# Patient Record
Sex: Male | Born: 1950 | Race: White | Hispanic: No | Marital: Married | Smoking: Former smoker
Health system: Southern US, Community
[De-identification: ages and names within clinical notes are randomized; demographics above are authoritative.]

## PROBLEM LIST (undated history)

## (undated) DIAGNOSIS — I701 Atherosclerosis of renal artery: Secondary | ICD-10-CM

## (undated) DIAGNOSIS — R9430 Abnormal result of cardiovascular function study, unspecified: Secondary | ICD-10-CM

## (undated) DIAGNOSIS — J449 Chronic obstructive pulmonary disease, unspecified: Secondary | ICD-10-CM

## (undated) DIAGNOSIS — K219 Gastro-esophageal reflux disease without esophagitis: Secondary | ICD-10-CM

## (undated) DIAGNOSIS — Z720 Tobacco use: Secondary | ICD-10-CM

## (undated) DIAGNOSIS — E785 Hyperlipidemia, unspecified: Secondary | ICD-10-CM

## (undated) DIAGNOSIS — M1990 Unspecified osteoarthritis, unspecified site: Secondary | ICD-10-CM

## (undated) DIAGNOSIS — C801 Malignant (primary) neoplasm, unspecified: Secondary | ICD-10-CM

## (undated) DIAGNOSIS — I739 Peripheral vascular disease, unspecified: Secondary | ICD-10-CM

## (undated) DIAGNOSIS — M546 Pain in thoracic spine: Secondary | ICD-10-CM

## (undated) DIAGNOSIS — I4891 Unspecified atrial fibrillation: Secondary | ICD-10-CM

## (undated) DIAGNOSIS — I2510 Atherosclerotic heart disease of native coronary artery without angina pectoris: Secondary | ICD-10-CM

## (undated) DIAGNOSIS — Z9981 Dependence on supplemental oxygen: Secondary | ICD-10-CM

## (undated) DIAGNOSIS — IMO0002 Reserved for concepts with insufficient information to code with codable children: Secondary | ICD-10-CM

## (undated) DIAGNOSIS — I219 Acute myocardial infarction, unspecified: Secondary | ICD-10-CM

## (undated) DIAGNOSIS — Z95828 Presence of other vascular implants and grafts: Secondary | ICD-10-CM

## (undated) DIAGNOSIS — G8929 Other chronic pain: Secondary | ICD-10-CM

## (undated) DIAGNOSIS — G894 Chronic pain syndrome: Secondary | ICD-10-CM

## (undated) HISTORY — PX: PR VEIN BYPASS GRAFT,AORTO-FEM-POP: 35551

## (undated) HISTORY — DX: Atherosclerosis of renal artery: I70.1

## (undated) HISTORY — PX: INGUINAL HERNIA REPAIR: SUR1180

## (undated) HISTORY — DX: Presence of other vascular implants and grafts: Z95.828

## (undated) HISTORY — DX: Gastro-esophageal reflux disease without esophagitis: K21.9

## (undated) HISTORY — DX: Unspecified osteoarthritis, unspecified site: M19.90

## (undated) HISTORY — DX: Reserved for concepts with insufficient information to code with codable children: IMO0002

## (undated) HISTORY — DX: Acute myocardial infarction, unspecified: I21.9

## (undated) HISTORY — DX: Tobacco use: Z72.0

## (undated) HISTORY — DX: Atherosclerotic heart disease of native coronary artery without angina pectoris: I25.10

## (undated) HISTORY — DX: Hyperlipidemia, unspecified: E78.5

## (undated) HISTORY — DX: Peripheral vascular disease, unspecified: I73.9

## (undated) HISTORY — DX: Unspecified atrial fibrillation: I48.91

## (undated) HISTORY — DX: Abnormal result of cardiovascular function study, unspecified: R94.30

## (undated) HISTORY — DX: Malignant (primary) neoplasm, unspecified: C80.1

## (undated) HISTORY — DX: Chronic obstructive pulmonary disease, unspecified: J44.9

---

## 2004-06-25 ENCOUNTER — Emergency Department (HOSPITAL_COMMUNITY): Admission: EM | Admit: 2004-06-25 | Discharge: 2004-06-26 | Payer: Self-pay | Admitting: Emergency Medicine

## 2005-09-29 ENCOUNTER — Emergency Department (HOSPITAL_COMMUNITY): Admission: EM | Admit: 2005-09-29 | Discharge: 2005-09-29 | Payer: Self-pay | Admitting: Emergency Medicine

## 2007-06-29 ENCOUNTER — Emergency Department (HOSPITAL_COMMUNITY): Admission: EM | Admit: 2007-06-29 | Discharge: 2007-06-29 | Payer: Self-pay | Admitting: Emergency Medicine

## 2008-06-02 ENCOUNTER — Emergency Department (HOSPITAL_COMMUNITY): Admission: EM | Admit: 2008-06-02 | Discharge: 2008-06-02 | Payer: Self-pay | Admitting: Emergency Medicine

## 2008-12-08 ENCOUNTER — Encounter: Payer: Self-pay | Admitting: Cardiology

## 2009-02-12 ENCOUNTER — Encounter: Payer: Self-pay | Admitting: Cardiology

## 2009-02-17 ENCOUNTER — Encounter: Payer: Self-pay | Admitting: Cardiology

## 2009-04-27 ENCOUNTER — Encounter: Payer: Self-pay | Admitting: Cardiology

## 2009-06-07 ENCOUNTER — Encounter: Payer: Self-pay | Admitting: Cardiology

## 2009-06-08 ENCOUNTER — Encounter: Payer: Self-pay | Admitting: Cardiology

## 2009-06-09 ENCOUNTER — Encounter: Payer: Self-pay | Admitting: Cardiology

## 2009-06-10 ENCOUNTER — Ambulatory Visit: Payer: Self-pay | Admitting: Cardiology

## 2009-06-11 ENCOUNTER — Encounter: Payer: Self-pay | Admitting: Cardiology

## 2009-06-12 ENCOUNTER — Encounter: Payer: Self-pay | Admitting: Cardiology

## 2009-06-28 ENCOUNTER — Ambulatory Visit: Payer: Self-pay | Admitting: Cardiology

## 2009-06-28 ENCOUNTER — Encounter: Payer: Self-pay | Admitting: Physician Assistant

## 2009-06-29 ENCOUNTER — Encounter: Payer: Self-pay | Admitting: Physician Assistant

## 2009-07-03 ENCOUNTER — Ambulatory Visit: Payer: Self-pay | Admitting: Surgery

## 2009-07-03 ENCOUNTER — Inpatient Hospital Stay (HOSPITAL_COMMUNITY): Admission: AD | Admit: 2009-07-03 | Discharge: 2009-07-05 | Payer: Self-pay | Admitting: Cardiology

## 2009-07-03 ENCOUNTER — Ambulatory Visit: Payer: Self-pay | Admitting: Cardiology

## 2009-07-04 ENCOUNTER — Encounter: Payer: Self-pay | Admitting: Cardiology

## 2009-07-05 ENCOUNTER — Encounter: Payer: Self-pay | Admitting: Cardiology

## 2009-07-11 ENCOUNTER — Encounter: Payer: Self-pay | Admitting: Cardiology

## 2009-07-13 ENCOUNTER — Ambulatory Visit: Payer: Self-pay | Admitting: Cardiology

## 2009-07-23 ENCOUNTER — Telehealth (INDEPENDENT_AMBULATORY_CARE_PROVIDER_SITE_OTHER): Payer: Self-pay | Admitting: *Deleted

## 2009-07-23 ENCOUNTER — Ambulatory Visit: Payer: Self-pay | Admitting: Surgery

## 2009-07-23 ENCOUNTER — Encounter: Payer: Self-pay | Admitting: Cardiology

## 2009-08-06 ENCOUNTER — Telehealth (INDEPENDENT_AMBULATORY_CARE_PROVIDER_SITE_OTHER): Payer: Self-pay | Admitting: *Deleted

## 2009-08-16 ENCOUNTER — Encounter: Payer: Self-pay | Admitting: Cardiology

## 2009-08-16 ENCOUNTER — Inpatient Hospital Stay (HOSPITAL_COMMUNITY): Admission: RE | Admit: 2009-08-16 | Discharge: 2009-08-17 | Payer: Self-pay | Admitting: Surgery

## 2009-08-27 ENCOUNTER — Ambulatory Visit: Payer: Self-pay | Admitting: Surgery

## 2009-08-27 ENCOUNTER — Encounter: Payer: Self-pay | Admitting: Cardiology

## 2009-08-27 ENCOUNTER — Encounter: Admission: RE | Admit: 2009-08-27 | Discharge: 2009-08-27 | Payer: Self-pay | Admitting: Surgery

## 2009-10-02 ENCOUNTER — Encounter: Payer: Self-pay | Admitting: Cardiology

## 2009-10-03 ENCOUNTER — Telehealth (INDEPENDENT_AMBULATORY_CARE_PROVIDER_SITE_OTHER): Payer: Self-pay | Admitting: *Deleted

## 2009-10-23 ENCOUNTER — Telehealth (INDEPENDENT_AMBULATORY_CARE_PROVIDER_SITE_OTHER): Payer: Self-pay | Admitting: *Deleted

## 2009-10-23 ENCOUNTER — Encounter: Payer: Self-pay | Admitting: Cardiology

## 2009-10-31 ENCOUNTER — Ambulatory Visit: Payer: Self-pay | Admitting: Cardiology

## 2009-11-09 ENCOUNTER — Ambulatory Visit: Payer: Self-pay | Admitting: Cardiology

## 2009-11-10 DIAGNOSIS — I219 Acute myocardial infarction, unspecified: Secondary | ICD-10-CM

## 2009-11-10 HISTORY — DX: Acute myocardial infarction, unspecified: I21.9

## 2009-12-19 ENCOUNTER — Ambulatory Visit (HOSPITAL_COMMUNITY): Admission: RE | Admit: 2009-12-19 | Discharge: 2009-12-19 | Payer: Self-pay | Admitting: Surgery

## 2010-01-08 ENCOUNTER — Telehealth (INDEPENDENT_AMBULATORY_CARE_PROVIDER_SITE_OTHER): Payer: Self-pay | Admitting: *Deleted

## 2010-01-11 ENCOUNTER — Ambulatory Visit: Payer: Self-pay | Admitting: Cardiology

## 2010-01-11 ENCOUNTER — Inpatient Hospital Stay (HOSPITAL_COMMUNITY): Admission: RE | Admit: 2010-01-11 | Discharge: 2010-01-18 | Payer: Self-pay | Admitting: Surgery

## 2010-01-11 ENCOUNTER — Encounter: Payer: Self-pay | Admitting: Surgery

## 2010-01-11 ENCOUNTER — Ambulatory Visit: Payer: Self-pay | Admitting: Surgery

## 2010-01-11 HISTORY — PX: ABDOMINAL AORTIC ANEURYSM REPAIR: SUR1152

## 2010-01-13 ENCOUNTER — Encounter: Payer: Self-pay | Admitting: Cardiology

## 2010-01-14 ENCOUNTER — Encounter: Payer: Self-pay | Admitting: Surgery

## 2010-01-21 ENCOUNTER — Inpatient Hospital Stay (HOSPITAL_COMMUNITY): Admission: EM | Admit: 2010-01-21 | Discharge: 2010-01-24 | Payer: Self-pay | Admitting: Emergency Medicine

## 2010-02-11 ENCOUNTER — Ambulatory Visit: Payer: Self-pay | Admitting: Surgery

## 2010-02-25 ENCOUNTER — Ambulatory Visit: Payer: Self-pay | Admitting: Surgery

## 2010-06-03 ENCOUNTER — Ambulatory Visit: Payer: Self-pay | Admitting: Surgery

## 2010-08-12 ENCOUNTER — Encounter: Payer: Self-pay | Admitting: Cardiology

## 2010-08-13 ENCOUNTER — Ambulatory Visit: Payer: Self-pay | Admitting: Cardiology

## 2010-12-12 NOTE — Miscellaneous (Signed)
  Clinical Lists Changes  Observations: Added new observation of PAST MED HX:  G E R D COPD (ICD-496).Marland Kitchensevere by PFTs August, 2010 * AORTA:   TOTAL OCCLUSION..by CT angiography.Nancie Neas.. August, 2010  /  surgery is planned but delayed by pneumothorax /  Aortobifemoral bypass ([facility])  01/11/2010) Post-op atrial fib 01/2010 post Aortobifem. RENAL ARTERY STENOSIS (ICD-440.1)..70%.Marland Kitchenostial.. by CT angiography  August, 2010 (presumably corrected with aortobifem surgery 01/2010) TOBACCO USER (ICD-305.1) PNEUMOTHORAX (ICD-512.8) INTERMITTENT CLAUDICATION, BILATERAL (ICD-443.9) CAD, NATIVE VESSEL (ICD-414.01)..catheterization August, 2010.. 70%   proximal LAD and 30% proximal RCA.[name] normal LV function .Marland Kitchenno intervention planned prior to surgery for aorta.. LV.. normal function by catheter August, 2010 dyslipidemia DJD  (08/12/2010 10:29) Added new observation of REFERRING MD: Barham (08/12/2010 10:29) Added new observation of PRIMARY MD: Hanasaj (08/12/2010 10:29)       Past History:  Past Medical History:  G E R D COPD (ICD-496).Marland Kitchensevere by PFTs August, 2010 * AORTA:   TOTAL OCCLUSION..by CT angiography.Nancie Neas.. August, 2010  /  surgery is planned but delayed by pneumothorax /  Aortobifemoral bypass ([facility])  01/11/2010) Post-op atrial fib 01/2010 post Aortobifem. RENAL ARTERY STENOSIS (ICD-440.1)..70%.Marland Kitchenostial.. by CT angiography  August, 2010 (presumably corrected with aortobifem surgery 01/2010) TOBACCO USER (ICD-305.1) PNEUMOTHORAX (ICD-512.8) INTERMITTENT CLAUDICATION, BILATERAL (ICD-443.9) CAD, NATIVE VESSEL (ICD-414.01)..catheterization August, 2010.. 70%   proximal LAD and 30% proximal RCA.[name] normal LV function .Marland Kitchenno intervention planned prior to surgery for aorta.. LV.. normal function by catheter August, 2010 dyslipidemia DJD

## 2010-12-12 NOTE — Assessment & Plan Note (Signed)
Summary: 1 YR FU   Visit Type:  Follow-up Referring Elsy Chiang:  Cecelia Byars Primary Danai Gotto:  Lita Mains  CC:  CAD.  History of Present Illness: The patient has known coronary disease.  He is doing well.  When I saw him last he was cleared to have major vascular surgery.  He had an aortobifem surgery in March 2011 and did well.  He is walking.  He is not having any chest pain.  He stopped smoking.  He is breathing better.  Preventive Screening-Counseling & Management  Alcohol-Tobacco     Smoking Status: quit     Year Quit: 01/2010  Current Medications (verified): 1)  Hydrocodone-Acetaminophen 5-500 Mg Tabs (Hydrocodone-Acetaminophen) .... Two Times A Day As Needed 2)  Alprazolam 0.5 Mg Tabs (Alprazolam) .... As Needed 3)  Flexeril 10 Mg Tabs (Cyclobenzaprine Hcl) .... As Needed 4)  Aspirin Ec 325 Mg Tbec (Aspirin) .... Take One Tablet By Mouth Daily 5)  Zantac 150 Mg Tabs (Ranitidine Hcl) .... Once Daily 6)  Ventolin Hfa 108 (90 Base) Mcg/act Aers (Albuterol Sulfate) .... As Needed 7)  Metoprolol Tartrate 25 Mg Tabs (Metoprolol Tartrate) .... Take 1/2 Tablet By Mouth Twice A Day 8)  Nitroglycerin 0.4 Mg Subl (Nitroglycerin) .... One Tablet Under Tongue Every 5 Minutes As Needed For Chest Pain---May Repeat Times Three 9)  Simvastatin 40 Mg Tabs (Simvastatin) .... Take One Tablet By Mouth Daily At Bedtime  Allergies (verified): No Known Drug Allergies  Comments:  Nurse/Medical Assistant: The patient's medication list and allergies were reviewed with the patient and were updated in the Medication and Allergy Lists.  Past History:  Past Medical History: Last updated: 08/12/2010  G E R D COPD (ICD-496).Marland Kitchensevere by PFTs August, 2010 * AORTA:   TOTAL OCCLUSION..by CT angiography.Nancie Neas.. August, 2010  /  surgery is planned but delayed by pneumothorax /  Aortobifemoral bypass ([facility])  01/11/2010) Post-op atrial fib 01/2010 post Aortobifem. RENAL ARTERY STENOSIS  (ICD-440.1)..70%.Marland Kitchenostial.. by CT angiography  August, 2010 (presumably corrected with aortobifem surgery 01/2010) TOBACCO USER (ICD-305.1) PNEUMOTHORAX (ICD-512.8) INTERMITTENT CLAUDICATION, BILATERAL (ICD-443.9) CAD, NATIVE VESSEL (ICD-414.01)..catheterization August, 2010.. 70%   proximal LAD and 30% proximal RCA.[name] normal LV function .Marland Kitchenno intervention planned prior to surgery for aorta.. LV.. normal function by catheter August, 2010 dyslipidemia DJD  Social History: Smoking Status:  quit  Review of Systems       Patient denies fever, chills, headache, sweats, rash, change in vision, change in hearing, chest pain, nausea vomiting, urinary symptoms.  All other systems are reviewed and are negative.  Vital Signs:  Patient profile:   60 year old male Height:      64 inches Weight:      129 pounds BMI:     22.22 Pulse rate:   70 / minute BP sitting:   134 / 86  (left arm) Cuff size:   regular  Vitals Entered By: Carlye Grippe (August 13, 2010 2:02 PM)  Physical Exam  General:  patient is stable. Eyes:  no xanthelasma. Neck:  no jugular venous distention. Lungs:  lungs reveal scattered rhonchi. Heart:  cardiac exam reveals S1-S2.  No clicks or significant murmurs. Abdomen:  abdomen is soft. Extremities:  no peripheral edema. Psych:  patient is oriented to person time and place.  Affect is normal.   Impression & Recommendations:  Problem # 1:  COPD (ICD-496)  His updated medication list for this problem includes:    Ventolin Hfa 108 (90 Base) Mcg/act Aers (Albuterol sulfate) .[name]... As  needed COPD is stable.  Problem # 2:  * AORTA:   TOTAL OCCLUSION The patient has now had aortobifem surgery and is doing well.  Problem # 3:  RENAL ARTERY STENOSIS (ICD-440.1) Is renal artery stenosis was presumably fixed at the time of his aortobifem surgery.  Problem # 4:  TOBACCO USER (ICD-305.1) The patient has stopped smoking.  I applauded him for this.  Problem # 5:  CAD, NATIVE  VESSEL (ICD-414.01)  His updated medication list for this problem includes:    Aspirin Ec 325 Mg Tbec (Aspirin) .[name]... Take one tablet by mouth daily    Metoprolol Tartrate 25 Mg Tabs (Metoprolol tartrate) .[name]... Take 1/2 tablet by mouth twice a day    Nitroglycerin 0.4 Mg Subl (Nitroglycerin) ..... One tablet under tongue every 5 minutes as needed for chest pain---may repeat times three Coronary disease is stable.  No further workup.  We'll see him back in one year.  Patient Instructions: 1)  Your physician wants you to follow-up in: 1 year. You will receive a reminder letter in the mail one-two months in advance. If you don't receive a letter, please call our office to schedule the follow-up appointment. 2)  Your physician recommends that you continue on your current medications as directed. Please refer to the Current Medication list given to you today.

## 2010-12-12 NOTE — Progress Notes (Signed)
  Faxed All Cardiac over to [facility] to fax [phone] [facility]  January 08, 2010 1:33 PM

## 2011-01-29 LAB — URINALYSIS, ROUTINE W REFLEX MICROSCOPIC
Bilirubin Urine: NEGATIVE
Ketones, ur: NEGATIVE mg/dL
Nitrite: NEGATIVE
Protein, ur: NEGATIVE mg/dL
Urobilinogen, UA: 0.2 mg/dL (ref 0.0–1.0)

## 2011-01-29 LAB — APTT: aPTT: 31 seconds (ref 24–37)

## 2011-01-29 LAB — CBC
HCT: 41.9 % (ref 39.0–52.0)
MCHC: 34.2 g/dL (ref 30.0–36.0)
MCV: 90.1 fL (ref 78.0–100.0)
Platelets: 194 10*3/uL (ref 150–400)
WBC: 11.1 10*3/uL — ABNORMAL HIGH (ref 4.0–10.5)

## 2011-01-29 LAB — PROTIME-INR: INR: 1.09 (ref 0.00–1.49)

## 2011-01-29 LAB — COMPREHENSIVE METABOLIC PANEL
BUN: 6 mg/dL (ref 6–23)
CO2: 23 mEq/L (ref 19–32)
Calcium: 9.2 mg/dL (ref 8.4–10.5)
Chloride: 111 mEq/L (ref 96–112)
Creatinine, Ser: 0.51 mg/dL (ref 0.4–1.5)
GFR calc Af Amer: >60 mL/min (ref >60)
GFR calc non Af Amer: >60 mL/min (ref >60)
Total Bilirubin: 0.9 mg/dL (ref 0.3–1.2)

## 2011-01-29 LAB — BLOOD GAS, ARTERIAL
Acid-Base Excess: 0.9 mmol/L (ref 0.0–2.0)
Bicarbonate: 25.3 mEq/L — ABNORMAL HIGH (ref 20.0–24.0)
TCO2: 26.6 mmol/L (ref 0–100)
pCO2 arterial: 42.9 mmHg (ref 35.0–45.0)
pO2, Arterial: 95.2 mmHg (ref 80.0–100.0)

## 2011-02-03 LAB — CLOSTRIDIUM DIFFICILE EIA
C difficile Toxins A+B, EIA: NEGATIVE
C difficile Toxins A+B, EIA: NEGATIVE

## 2011-02-03 LAB — GLUCOSE, CAPILLARY
Glucose-Capillary: 104 mg/dL — ABNORMAL HIGH (ref 70–99)
Glucose-Capillary: 106 mg/dL — ABNORMAL HIGH (ref 70–99)
Glucose-Capillary: 122 mg/dL — ABNORMAL HIGH (ref 70–99)
Glucose-Capillary: 130 mg/dL — ABNORMAL HIGH (ref 70–99)
Glucose-Capillary: 135 mg/dL — ABNORMAL HIGH (ref 70–99)
Glucose-Capillary: 139 mg/dL — ABNORMAL HIGH (ref 70–99)
Glucose-Capillary: 140 mg/dL — ABNORMAL HIGH (ref 70–99)
Glucose-Capillary: 141 mg/dL — ABNORMAL HIGH (ref 70–99)
Glucose-Capillary: 158 mg/dL — ABNORMAL HIGH (ref 70–99)
Glucose-Capillary: 168 mg/dL — ABNORMAL HIGH (ref 70–99)
Glucose-Capillary: 69 mg/dL — ABNORMAL LOW (ref 70–99)
Glucose-Capillary: 82 mg/dL (ref 70–99)
Glucose-Capillary: 85 mg/dL (ref 70–99)
Glucose-Capillary: 86 mg/dL (ref 70–99)
Glucose-Capillary: 97 mg/dL (ref 70–99)

## 2011-02-03 LAB — CBC
HCT: 22.8 % — ABNORMAL LOW (ref 39.0–52.0)
HCT: 26.2 % — ABNORMAL LOW (ref 39.0–52.0)
HCT: 28.4 % — ABNORMAL LOW (ref 39.0–52.0)
HCT: 42.9 % (ref 39.0–52.0)
Hemoglobin: 14.3 g/dL (ref 13.0–17.0)
Hemoglobin: 9.8 g/dL — ABNORMAL LOW (ref 13.0–17.0)
MCHC: 33.7 g/dL (ref 30.0–36.0)
MCHC: 33.7 g/dL (ref 30.0–36.0)
MCHC: 33.9 g/dL (ref 30.0–36.0)
MCHC: 34 g/dL (ref 30.0–36.0)
MCHC: 34.1 g/dL (ref 30.0–36.0)
MCHC: 34.4 g/dL (ref 30.0–36.0)
MCHC: 34.4 g/dL (ref 30.0–36.0)
MCV: 89.3 fL (ref 78.0–100.0)
MCV: 90.1 fL (ref 78.0–100.0)
MCV: 90.5 fL (ref 78.0–100.0)
MCV: 90.9 fL (ref 78.0–100.0)
MCV: 90.9 fL (ref 78.0–100.0)
Platelets: 100 10*3/uL — ABNORMAL LOW (ref 150–400)
Platelets: 135 10*3/uL — ABNORMAL LOW (ref 150–400)
Platelets: 396 10*3/uL (ref 150–400)
Platelets: 91 10*3/uL — ABNORMAL LOW (ref 150–400)
RBC: 2.89 MIL/uL — ABNORMAL LOW (ref 4.22–5.81)
RBC: 3.22 MIL/uL — ABNORMAL LOW (ref 4.22–5.81)
RBC: 3.39 MIL/uL — ABNORMAL LOW (ref 4.22–5.81)
RBC: 3.61 MIL/uL — ABNORMAL LOW (ref 4.22–5.81)
RBC: 3.69 MIL/uL — ABNORMAL LOW (ref 4.22–5.81)
RBC: 4.74 MIL/uL (ref 4.22–5.81)
RDW: 14.5 % (ref 11.5–15.5)
RDW: 14.7 % (ref 11.5–15.5)
RDW: 15.7 % — ABNORMAL HIGH (ref 11.5–15.5)
WBC: 10.4 10*3/uL (ref 4.0–10.5)
WBC: 10.7 10*3/uL — ABNORMAL HIGH (ref 4.0–10.5)
WBC: 12.4 10*3/uL — ABNORMAL HIGH (ref 4.0–10.5)
WBC: 15 10*3/uL — ABNORMAL HIGH (ref 4.0–10.5)
WBC: 15.2 10*3/uL — ABNORMAL HIGH (ref 4.0–10.5)
WBC: 15.4 10*3/uL — ABNORMAL HIGH (ref 4.0–10.5)
WBC: 8.9 10*3/uL (ref 4.0–10.5)
WBC: 9.6 10*3/uL (ref 4.0–10.5)

## 2011-02-03 LAB — BASIC METABOLIC PANEL
BUN: 7 mg/dL (ref 6–23)
BUN: 8 mg/dL (ref 6–23)
CO2: 26 mEq/L (ref 19–32)
CO2: 26 mEq/L (ref 19–32)
CO2: 29 mEq/L (ref 19–32)
Calcium: 7.3 mg/dL — ABNORMAL LOW (ref 8.4–10.5)
Calcium: 7.6 mg/dL — ABNORMAL LOW (ref 8.4–10.5)
Calcium: 7.7 mg/dL — ABNORMAL LOW (ref 8.4–10.5)
Calcium: 8.2 mg/dL — ABNORMAL LOW (ref 8.4–10.5)
Chloride: 105 mEq/L (ref 96–112)
Creatinine, Ser: 0.45 mg/dL (ref 0.4–1.5)
Creatinine, Ser: 0.55 mg/dL (ref 0.4–1.5)
Creatinine, Ser: 0.55 mg/dL (ref 0.4–1.5)
Creatinine, Ser: 0.57 mg/dL (ref 0.4–1.5)
Creatinine, Ser: 0.58 mg/dL (ref 0.4–1.5)
Creatinine, Ser: 0.58 mg/dL (ref 0.4–1.5)
GFR calc Af Amer: >60 mL/min (ref >60)
GFR calc Af Amer: >60 mL/min (ref >60)
GFR calc Af Amer: >60 mL/min (ref >60)
GFR calc Af Amer: >60 mL/min (ref >60)
GFR calc non Af Amer: >60 mL/min (ref >60)
GFR calc non Af Amer: >60 mL/min (ref >60)
GFR calc non Af Amer: >60 mL/min (ref >60)
Glucose, Bld: 156 mg/dL — ABNORMAL HIGH (ref 70–99)
Potassium: 4.1 mEq/L (ref 3.5–5.1)
Sodium: 139 mEq/L (ref 135–145)

## 2011-02-03 LAB — DIFFERENTIAL
Eosinophils Relative: 1 % (ref 0–5)
Lymphocytes Relative: 13 % (ref 12–46)
Lymphs Abs: 1.6 10*3/uL (ref 0.7–4.0)
Monocytes Absolute: 1.5 10*3/uL — ABNORMAL HIGH (ref 0.1–1.0)
Monocytes Relative: 12 % (ref 3–12)
Neutro Abs: 9.3 10*3/uL — ABNORMAL HIGH (ref 1.7–7.7)

## 2011-02-03 LAB — COMPREHENSIVE METABOLIC PANEL
AST: 31 U/L (ref 0–37)
AST: 34 U/L (ref 0–37)
Albumin: 1.8 g/dL — ABNORMAL LOW (ref 3.5–5.2)
Albumin: 2.8 g/dL — ABNORMAL LOW (ref 3.5–5.2)
BUN: 5 mg/dL — ABNORMAL LOW (ref 6–23)
CO2: 23 mEq/L (ref 19–32)
CO2: 28 mEq/L (ref 19–32)
Calcium: 7.2 mg/dL — ABNORMAL LOW (ref 8.4–10.5)
Calcium: 8.6 mg/dL (ref 8.4–10.5)
Chloride: 106 mEq/L (ref 96–112)
Chloride: 106 mEq/L (ref 96–112)
Chloride: 99 mEq/L (ref 96–112)
Creatinine, Ser: 0.54 mg/dL (ref 0.4–1.5)
Creatinine, Ser: 0.55 mg/dL (ref 0.4–1.5)
Creatinine, Ser: 0.57 mg/dL (ref 0.4–1.5)
GFR calc Af Amer: >60 mL/min (ref >60)
GFR calc Af Amer: >60 mL/min (ref >60)
GFR calc Af Amer: >60 mL/min (ref >60)
GFR calc non Af Amer: >60 mL/min (ref >60)
GFR calc non Af Amer: >60 mL/min (ref >60)
Glucose, Bld: 108 mg/dL — ABNORMAL HIGH (ref 70–99)
Total Bilirubin: 0.6 mg/dL (ref 0.3–1.2)
Total Bilirubin: 1 mg/dL (ref 0.3–1.2)
Total Protein: 6.4 g/dL (ref 6.0–8.3)

## 2011-02-03 LAB — URINALYSIS, ROUTINE W REFLEX MICROSCOPIC
Bilirubin Urine: NEGATIVE
Glucose, UA: NEGATIVE mg/dL
Glucose, UA: NEGATIVE mg/dL
Ketones, ur: NEGATIVE mg/dL
Nitrite: NEGATIVE
Protein, ur: NEGATIVE mg/dL
Urobilinogen, UA: 0.2 mg/dL (ref 0.0–1.0)
pH: 7 (ref 5.0–8.0)

## 2011-02-03 LAB — BLOOD GAS, ARTERIAL
Acid-Base Excess: 1.6 mmol/L (ref 0.0–2.0)
Bicarbonate: 25.9 mEq/L — ABNORMAL HIGH (ref 20.0–24.0)
Drawn by: 206361
Drawn by: 244801
O2 Content: 4 L/min
TCO2: 26.2 mmol/L (ref 0–100)
TCO2: 27.2 mmol/L (ref 0–100)
pCO2 arterial: 42.4 mmHg (ref 35.0–45.0)
pCO2 arterial: 54.7 mmHg — ABNORMAL HIGH (ref 35.0–45.0)
pH, Arterial: 7.274 — ABNORMAL LOW (ref 7.350–7.450)
pO2, Arterial: 144 mmHg — ABNORMAL HIGH (ref 80.0–100.0)
pO2, Arterial: 98.5 mmHg (ref 80.0–100.0)

## 2011-02-03 LAB — URINE CULTURE: Colony Count: 9000

## 2011-02-03 LAB — POCT I-STAT 3, ART BLOOD GAS (G3+)
Bicarbonate: 25.6 mEq/L — ABNORMAL HIGH (ref 20.0–24.0)
Patient temperature: 37.5
TCO2: 27 mmol/L (ref 0–100)
pO2, Arterial: 79 mmHg — ABNORMAL LOW (ref 80.0–100.0)

## 2011-02-03 LAB — PREPARE FRESH FROZEN PLASMA

## 2011-02-03 LAB — POCT I-STAT, CHEM 8
BUN: 8 mg/dL (ref 6–23)
Calcium, Ion: 1.13 mmol/L (ref 1.12–1.32)
Creatinine, Ser: 0.6 mg/dL (ref 0.4–1.5)
Hemoglobin: 11.2 g/dL — ABNORMAL LOW (ref 13.0–17.0)
Sodium: 134 mEq/L — ABNORMAL LOW (ref 135–145)
TCO2: 28 mmol/L (ref 0–100)

## 2011-02-03 LAB — URINE MICROSCOPIC-ADD ON

## 2011-02-03 LAB — TYPE AND SCREEN
ABO/RH(D): A NEG
Antibody Screen: NEGATIVE

## 2011-02-03 LAB — BRAIN NATRIURETIC PEPTIDE: Pro B Natriuretic peptide (BNP): 688 pg/mL — ABNORMAL HIGH (ref 0.0–100.0)

## 2011-02-03 LAB — PROTIME-INR: Prothrombin Time: 18.3 seconds — ABNORMAL HIGH (ref 11.6–15.2)

## 2011-02-03 LAB — RAPID URINE DRUG SCREEN, HOSP PERFORMED
Amphetamines: NOT DETECTED
Barbiturates: NOT DETECTED
Benzodiazepines: NOT DETECTED
Opiates: NOT DETECTED

## 2011-02-03 LAB — POCT CARDIAC MARKERS: Myoglobin, poc: 86.3 ng/mL (ref 12–200)

## 2011-02-03 LAB — APTT
aPTT: 32 seconds (ref 24–37)
aPTT: 32 seconds (ref 24–37)
aPTT: 33 seconds (ref 24–37)

## 2011-02-03 LAB — MAGNESIUM: Magnesium: 2 mg/dL (ref 1.5–2.5)

## 2011-02-03 LAB — AMYLASE: Amylase: 139 U/L — ABNORMAL HIGH (ref 0–105)

## 2011-02-13 LAB — CBC
MCHC: 33 g/dL (ref 30.0–36.0)
MCV: 91.3 fL (ref 78.0–100.0)
RBC: 4.86 MIL/uL (ref 4.22–5.81)
RDW: 14.2 % (ref 11.5–15.5)

## 2011-02-13 LAB — BLOOD GAS, ARTERIAL
Acid-base deficit: 1.2 mmol/L (ref 0.0–2.0)
O2 Saturation: 97.6 %
Patient temperature: 98.6
TCO2: 24 mmol/L (ref 0–100)

## 2011-02-13 LAB — URINALYSIS, ROUTINE W REFLEX MICROSCOPIC
Bilirubin Urine: NEGATIVE
Ketones, ur: NEGATIVE mg/dL
Nitrite: NEGATIVE
Specific Gravity, Urine: 1.015 (ref 1.005–1.030)
Urobilinogen, UA: 0.2 mg/dL (ref 0.0–1.0)

## 2011-02-13 LAB — TYPE AND SCREEN
ABO/RH(D): A NEG
Antibody Screen: NEGATIVE

## 2011-02-13 LAB — PROTIME-INR: Prothrombin Time: 13.7 seconds (ref 11.6–15.2)

## 2011-02-13 LAB — COMPREHENSIVE METABOLIC PANEL
AST: 23 U/L (ref 0–37)
CO2: 26 mEq/L (ref 19–32)
Calcium: 9.1 mg/dL (ref 8.4–10.5)
Creatinine, Ser: 0.64 mg/dL (ref 0.4–1.5)
GFR calc Af Amer: >60 mL/min (ref >60)
GFR calc non Af Amer: >60 mL/min (ref >60)
Total Protein: 7.5 g/dL (ref 6.0–8.3)

## 2011-02-13 LAB — APTT: aPTT: 28 seconds (ref 24–37)

## 2011-02-13 LAB — ABO/RH: ABO/RH(D): A NEG

## 2011-02-15 LAB — BASIC METABOLIC PANEL
BUN: 6 mg/dL (ref 6–23)
Calcium: 8.7 mg/dL (ref 8.4–10.5)
Creatinine, Ser: 0.63 mg/dL (ref 0.4–1.5)
GFR calc non Af Amer: >60 mL/min (ref >60)
Glucose, Bld: 89 mg/dL (ref 70–99)

## 2011-02-15 LAB — LIPID PANEL
Cholesterol: 213 mg/dL — ABNORMAL HIGH (ref 0–200)
LDL Cholesterol: 166 mg/dL — ABNORMAL HIGH (ref 0–99)
Triglycerides: 86 mg/dL (ref <150)

## 2011-02-15 LAB — CBC
HCT: 39.2 % (ref 39.0–52.0)
Platelets: 196 10*3/uL (ref 150–400)
RDW: 15.2 % (ref 11.5–15.5)

## 2011-02-15 LAB — BLOOD GAS, ARTERIAL
Bicarbonate: 24.2 mEq/L — ABNORMAL HIGH (ref 20.0–24.0)
FIO2: 0.21 %
O2 Saturation: 96.5 %
pO2, Arterial: 84.6 mmHg (ref 80.0–100.0)

## 2011-03-25 NOTE — Cardiovascular Report (Signed)
Kenneth Merritt, Kenneth Merritt NO.:  [id number]   MEDICAL RECORD NO.:  [id number]          PATIENT TYPE:  INP   LOCATION:  2807                         FACILITY:  [facility]   PHYSICIAN:  Everardo Beals. Juanda Chance, MD, FACCDATE OF BIRTH:  1951-06-16   DATE OF PROCEDURE:  07/03/2009  DATE OF DISCHARGE:                            CARDIAC CATHETERIZATION   CLINICAL HISTORY:  Kenneth Merritt is a 60 years old and has known peripheral  vascular disease with claudication and reduced indices by noninvasive  studies.  He was recently hospitalized at [facility] with chest  pain.  He had a troponin of 0.11.  He had a myocardial perfusion scan,  which suggested a small area of inferoapical ischemia.  He initially was  reluctant to have catheterization but subsequently agreed to proceed  with catheterization.   PROCEDURE:  The procedure was initially attempted via the right femoral  artery, but we could not feel pulses and we did not persist very long.  We quickly went to a right brachial approach.  The corners were  performed with TIG catheter #4.  The left ventriculogram was performed  and then the catheter was advanced to the distal aorta to the thoracic  aorta at the level of the diaphragm and abdominal angiography was  performed.  The patient tolerated the procedure well and left the  laboratory in satisfactory condition.   RESULTS:  Left main coronary artery:  The left main coronary was free of  significant disease.   Left anterior descending artery:  The ascending artery was diffusely  irregular.  This gave rise to two septal perforators in several small  diagonal branches.  There was 78% narrowing in the proximal LAD, which  appeared somewhat worse before nitroglycerin, but improved to 70% after  nitroglycerin.   Circumflex artery:  The circumflex artery gave rise to a small marginal  branch and atrial branch, a second marginal branch and a posterolateral  branch, these vessels were  free of significant disease.   Right coronary artery:  The right coronary artery was a moderate-sized  vessel gave rise to conus branch, two right ventricle branches, a  posterior descending branch, and two posterolateral branches.  There was  30% narrowing in the proximal right coronary artery.   Left ventriculogram:  The left ventriculogram performed in the RAO  projection showed good wall motion with no areas of hypokinesis.  The  estimated ejection fraction was 60%.   The aortic pressure was 112/67, mean of 86.  Left neck pressure was  112/8.   Distal aortogram:  A distal aortogram was performed, which showed total  occlusion of the aorta just below the level of the renal arteries.  The  left renal artery had a 70% ostial stenosis.  The occlusion was quite  unusual and was very smooth and not tapered.  We could see the right  iliac fill late by collaterals.   CONCLUSION:  1. Coronary artery disease status post recent non-ST-elevation MI with      70-80% narrowing in the proximal LAD, no significant obstruction in  circumflex artery, 30% narrowing in the proximal right coronary      artery and normal LV function.  2. 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   RECOMMENDATIONS:  I think the patient will need surgery for his totally  occluded aorta, and we will plan vascular consultation.  I do not think  intervention on the LAD will make this surgery any safer, but I think we  may consider intervention after his surgical procedure assuming this is  what is recommended and done.      Kenneth Elvera Lennox Juanda Chance, MD, [facility]  Electronically Signed     BRB/MEDQ  D:  07/03/2009  T:  07/04/2009  Job:  [id number]   cc:   Kenneth Codding, MD,FACC  Cardiopulmonary Lab  Kenneth Abed, MD, [facility]

## 2011-03-25 NOTE — Assessment & Plan Note (Signed)
OFFICE VISIT   Kenneth Merritt, Kenneth Merritt  DOB:  19-Apr-1951                                       02/25/2010  ZOXWR#:[id number]   The patient comes back today for follow-up.  He is status post  aortobifemoral bypass graft on January 11, 2010.  He had been having some  problems with nausea and vomiting, started on Reglan and Nexium last  week.  He said he is about 60% improved.  He is complaining of some  right leg numbness and back pain which is something he had been dealing  with for a long time.   Overall I think he is doing better and he seems to feel like he is  improving.  He was hospitalized for pneumonia over the last couple of  weeks.  That has improved.  I am giving him 30 Tylenol #3 to help with  his back pain to tide him over until he goes to see his primary care  doctor.  I will plan on seeing him back in 3 months.     Jorge Ny, MD  Electronically Signed   VWB/MEDQ  D:  02/25/2010  T:  02/26/2010  Job:  [id number]

## 2011-03-25 NOTE — Procedures (Signed)
CAROTID DUPLEX EXAM   INDICATION:  Preop for abdominal aortic bypass graft.   HISTORY:  Diabetes:  No  Cardiac:  Yes  Hypertension:  No  Smoking:  Yes  Previous Surgery:  No  CV History:  No  Amaurosis Fugax No, Paresthesias No, Hemiparesis No                                       RIGHT             LEFT  Brachial systolic pressure:         110               110  Brachial Doppler waveforms:         Biphasic          Biphasic  Vertebral direction of flow:        Antegrade         Antegrade  DUPLEX VELOCITIES (cm/sec)  CCA peak systolic                   85                64  ECA peak systolic                   92                64  ICA peak systolic                   80                90  ICA end diastolic                   34                42  PLAQUE MORPHOLOGY:                  Heterogeneous     heterogeneous  PLAQUE AMOUNT:                      Mild              Mild  PLAQUE LOCATION:                    ICA               ICA   IMPRESSION:  1. 1% to 39% stenosis noted in bilateral internal carotid arteries.  2.  Antegrade bilateral vertebral arteries.    ___________________________________________  V. Charlena Cross, MD   MG/MEDQ  D:  07/23/2009  T:  07/24/2009  Job:  [id number]

## 2011-03-25 NOTE — Assessment & Plan Note (Signed)
OFFICE VISIT   Kenneth Merritt, Kenneth Merritt  DOB:  05-29-1951                                       02/11/2010  [id number]#:[id number]   Patient comes back in today.  He is status post aortobifemoral bypass  graft on June 9 for aortic occlusion.  He was readmitted for nausea,  vomiting, and diarrhea with unknown etiology.  Comes back in today for  followup.   He has palpable pedal pulses.  His ABIs are now greater than 1.  His  incision is well healed without hernia.  He is having regular bowel  movements but does complain of some acid reflux-like symptoms and  persistent nausea.   I am going to try him with Reglan and Nexium and see him back in a week  to see how he is doing.     Jorge Ny, MD  Electronically Signed   VWB/MEDQ  D:  02/11/2010  T:  02/12/2010  Job:  [id number]

## 2011-03-25 NOTE — Consult Note (Signed)
Kenneth Merritt, Kenneth Merritt                 ACCOUNT NO.:  [id number]   MEDICAL RECORD NO.:  [id number]          PATIENT TYPE:  INP   LOCATION:  3735                         FACILITY:  [facility]   PHYSICIAN:  Juleen China IV, MDDATE OF BIRTH:  October 29, 1951   DATE OF CONSULTATION:  07/03/2009  DATE OF DISCHARGE:                                 CONSULTATION   REASON FOR CONSULTATION:  Aortic occlusion.   HISTORY:  Kenneth Merritt is a 60 year old gentleman I am seeing at the  request of Dr. Juanda Chance for evaluation of an aortic occlusion.  The  patient has been having epigastric pain and due to his symptoms, there  was concern over coronary artery disease.  He was taken for cardiac cath  today by Dr. Juanda Chance.  An abdominal aortogram was performed, which  reveals aortic occlusion at the level of the renal arteries.  The  patient states that he has significant problems with walking.  He can  walk approximately 10-30 feet before his legs give out.  This is  severely disabling him as it is affecting his ability to work.  This has  been going on for over a year and has gotten progressively worse.  The  patient is a chronic smoker.   PAST MEDICAL HISTORY:  Significant for,  1. COPD.  2. Arthritis.  3. Degenerative joint disease.   SOCIAL HISTORY:  He is a nondrinker.  He does have significant tobacco  abuse with smoking.   FAMILY HISTORY:  Noncontributory.   ALLERGIES:  PREVACID.   MEDICATIONS:  Hydrocodone, acetaminophen, Flexeril, omeprazole.   REVIEW OF SYSTEMS:  Negative for fevers, chills.  Negative for nausea,  vomiting.  Positive for epigastric pain/reflux.  Negative for  constipation and diarrhea.  Positive for severe claudication.  Negative  for ulcers.  Negative for rest pain.   PHYSICAL EXAMINATION:  VITAL SIGNS:  Blood pressure is 117/67, pulse is  67, respirations 16, temperature 98.1, O2 sats are 96% on room air.  GENERAL:  This is a well-appearing man in no acute distress.  HEENT:   Normocephalic, atraumatic.  Pupils equal.  Sclerae anicteric.  NECK:  Supple.  No JVD.  CARDIOVASCULAR:  Regular rate and rhythm.  Respirations are coarse  bilaterally.  ABDOMEN:  Soft, nontender.  EXTREMITIES:  Perfused without ulceration.  His gait is intact.  PSYCH:  He is alert and oriented x3.   DIAGNOSTIC STUDIES:  I have reviewed the patient's cardiac  catheterization.  He has an abdominal aortogram, which reveals aortic  occlusion at the level of the renal arteries.  There is reconstitution  in the right groin.  Left groin is not well visualized.   LABORATORY DATA:  Not available at this time.   ASSESSMENT:  Severe lifestyle-limiting claudication with known aortic  occlusion.   PLAN:  I had a long conversation with the patient and his family today  discussing the findings at his cardiac catheterization regarding his  aortic occlusion.  At this point, I feel that his symptoms warrant the  discussions for surgical repair.  He is not a candidate for  percutaneous  options.  I discussed with him his operative options, which include a  thoracofemoral bypass versus abdominal femoral bypass versus an axillary  femoral bypass.  I think the type of operation that will best suit him  will be dependent on the results of a CT angiogram, which will delineate  the disease level below his renal arteries as to whether or not he would  be a candidate for an abdominal repair.  CT scan will also identify  whether or not his thoracic aorta could serve as an adequate source for  inflow.  I am somewhat concerned with his smoking history and persistent  cough as to how well he would tolerate a thoraco or an abdominal  approach.  He will need to get pulmonary function tests to help make  this decision.  He will also need to have carotid Dopplers performed as  well as a baseline lower extremity arterial ultrasound.  I will follow  up with these tests tomorrow once they are done and we will make  plans  if possible.   Thank you for this consult.      Jorge Ny, MD  Electronically Signed     VWB/MEDQ  D:  07/03/2009  T:  07/04/2009  Job:  [id number]

## 2011-03-25 NOTE — Discharge Summary (Signed)
Kenneth Merritt, Kenneth Merritt                 ACCOUNT NO.:  [id number]   MEDICAL RECORD NO.:  [id number]          PATIENT TYPE:  INP   LOCATION:  3735                         FACILITY:  [facility]   PHYSICIAN:  Everardo Beals. Juanda Chance, MD, FACCDATE OF BIRTH:  1951-06-21   DATE OF ADMISSION:  07/03/2009  DATE OF DISCHARGE:  07/05/2009                               DISCHARGE SUMMARY   PRIMARY CARDIOLOGIST:  Luis Abed, MD, [facility]   CARDIOVASCULAR SURGEON:  1. Durene Cal IV, MD   DISCHARGE DIAGNOSES:  1. Peripheral arterial disease.      a.     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      b.     70% ostial left renal artery stenosis.  2. Coronary artery disease.      a.     Status post recent non-ST-elevation myocardial infarction.      b.     70-80% narrowing in proximal left anterior descending and       30% narrowing in the proximal right coronary artery, normal left       ventricular function.  3. Severe obstructive airway disease.      a.     TFTs performed this admission with FVC, FEV-1, FEV-1/FVC       ratio, and FEF 25-75% reduced indicating airway obstruction.  4. Hyperlipidemia.   SECONDARY DIAGNOSES:  1. Arthritis.  2. Degenerative joint disease.  3. Ongoing tobacco abuse disorder.   ALLERGIES:  NKDA.   PROCEDURE:  1. Lower extremity arterial Dopplers completed at [facility] on      June 29, 2009:  Severe bilateral lower extremity arterial      occlusive disease.  2. Cardiac catheterization on July 03, 2009:  CAD with 70-80%      narrowing in the proximal LAD, no significant obstruction in      circumflex artery, 30% narrowing of the proximal RCA, and normal LV      function.  Total occlusion of the aorta below the renal arteries      with 70% ostial left RAS.  3. CT angio of the chest on July 04, 2009:  Unremarkable.  4. CTA of the chest.  Thoracic aorta shows no evidence of pathology.      Severe emphysematous lung disease present.  5. 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  There is focal 60-70%      proximal stenosis in the left renal artery.  No significant distal      occlusive disease is identified (please see full report from      complete details).  Iliac reconstitution occurs bilaterally at the      common iliac bifurcation.  Reconstituted iliac arteries are      diffusely small.  6. Pulmonary function tests on July 05, 2009:  Severe obstructive      airway disease.   HISTORY OF PRESENT  ILLNESS:  Kenneth Merritt is a 60 year old Caucasian male  with known peripheral vascular disease with claudication and reduced  indices by noninvasive studies.  He was recently hospitalized to  [facility] with chest pain.  He had a troponin of 0.11.  He had a  myocardial perfusion scan which suggested a small area of inferoapical  ischemia.  He initially was reluctant to have catheterization, but  subsequently agreed to proceed.  He presents today, July 03, 2009, for  diagnostic left heart catheterization.   HOSPITAL COURSE:  The patient is admitted and underwent procedures as  described above.  He tolerated them well without any significant  complications.  Secondary to above findings of significant aortic  occlusive disease, a consultation with Dr. Myra Gianotti was requested.  Dr.  Elpidio Anis did see the patient and is planning to proceed with surgical  revascularization in approximately 2-3 weeks with followup in the office  prior to procedure.  The patient also received pulmonary function test  (see above).  The patient also received extensive counseling as well as  a formal tobacco cessation consult due to the patient's ongoing tobacco  disorder.  Beside following up with Dr. Myra Gianotti, the patient will also  be scheduled to follow up with his primary cardiologist in [place] on  July 13, 2009,  at 1 p.m.  The patient is seen by both Dr. Charlies Constable and Dr. Myra Gianotti on July 05, 2009, and deemed stable for  discharge.  At that time, he was given his new medication list,  prescriptions, followup instructions, and post-cath instructions.  All  questions or concerns were addressed.   DISCHARGE LABORATORY DATA:  Arterial blood gas on room air, FiO2 of  0.215, pH 7.412, pCO2 of 38.7, pO2 of 84.6, bicarb 24.2, TCO2 of 25.3,  acid base excess 0.1, oxygen saturation 96.5.  WBC 7.7, HGB 13.3, HCT  39.2, and PLT count 196.  Sodium 141, potassium 4.1, chloride 108, CO2  of 27, BUN 6, creatinine 0.63, glucose 89, and calcium 8.7.  Total  cholesterol 230, triglycerides 86, HDL 30, LDL 166, and VLDL 79.   FOLLOWUP PLANS AND APPOINTMENTS:  Please see hospital course.   DISCHARGE MEDICATIONS:  Please see computer list.   DURATION OF DISCHARGE ENCOUNTER:  Including physician time was 45  minutes.      Jarrett Ables, PAC      Bruce R. Juanda Chance, MD, [facility]  Electronically Signed    MS/MEDQ  D:  07/05/2009  T:  07/06/2009  Job:  [id number]   cc:   Luis Abed, MD, [facility]  Gene Serpe, PA-C  V. Charlena Cross, MD

## 2011-03-25 NOTE — Assessment & Plan Note (Signed)
OFFICE VISIT   FABION, GATSON  DOB:  [birth year]                                       08/27/2009  ZOXWR#:[id number]   REASON FOR VISIT:  Pneumothorax.   HISTORY:  This is a 60 year old gentleman who was scheduled to undergo  aortobifemoral bypass graft for severe lifestyle-limiting claudication.  His preoperative chest x-ray showed apical right side pneumothorax and  therefore his operation was cancelled.  He was admitted to the hospital  and kept on oxygen overnight.  Follow up x-ray revealed no significant  changes.  The patient was feeling fine so he was discharged home.  The  patient continues to have a cough, however, he is breathing much better.  He is trying to quit smoking with help of his family.  He still has a  productive cough which is not purulent, he denies having any chest pain,  he still has lifestyle limiting claudication.   PHYSICAL EXAMINATION:  Blood pressure 117/78, pulse 106 and temperature  98.1.  He is well-appearing, no distress.  Respirations are nonlabored.   DIAGNOSTIC STUDIES:  I have independently reviewed his chest x-ray and  there has been interval resolution of the right side pneumothorax.   ASSESSMENT/PLAN:  Lifestyle limiting claudication.   PLAN:  I do not recommend operating on him due to his pneumothorax for  at least another 3 weeks.  This puts it right before Thanksgiving.  Patient is going to talk with his family about the timing of the  operation.  He will contact us to schedule his operation and it may be  as late as January.  In the interval he is going to continue to work on  smoking cessation.   Jorge Ny, MD  Electronically Signed   VWB/MEDQ  D:  08/27/2009  T:  08/28/2009  Job:  [id number]   cc:   Everardo Beals. Juanda Chance, MD, [facility]

## 2011-03-25 NOTE — Assessment & Plan Note (Signed)
OFFICE VISIT   Kenneth Merritt, Kenneth Merritt  DOB:  [birth year]                                       07/23/2009  PPIRJ#:[id number]   REASON FOR VISIT:  Aortic occlusion.   HISTORY:  This is a 60 year old gentleman that I saw while an inpatient  in the hospital for aortic occlusion.  The patient was admitted for  epigastric pain and there was concern of coronary artery disease.  Cardiac catheterization was performed by Dr. Juanda Chance.  The patient has  severe lifestyle-limiting claudication.  He can only walk 10 to 30 feet  before his legs give out.  This is severely debilitating for him.  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   PAST MEDICAL HISTORY:  COPD, arthritis, degenerative joint disease.   SOCIAL HISTORY:  Nondrinker; positive for tobacco.   FAMILY HISTORY:  Noncontributory.   ALLERGIES:  Prevacid.   MEDICATIONS:  Hydrocodone, acetaminophen, Flexeril, omeprazole.   REVIEW OF SYSTEMS:  Negative for fevers, chills, nausea and vomiting.  Positive for epigastric pain and reflux in the past.  Negative for  constipation and diarrhea.  Positive for cough.  All other systems are  negative.   PHYSICAL EXAMINATION:  Blood pressure 119/73, pulse 74.  He is well-  appearing in no distress.  Cardiovascular:  Regular rate and rhythm.  Respirations nonlabored.  Extremities are warm and well perfused.  Neurologically he is intact.   ASSESSMENT:  Aortic occlusion.   PLAN:  The patient will be scheduled for aortobifemoral bypass graft.  I  discussed the risks and benefits of the procedure with the patient and  his family.  We discussed risks of pulmonary complications, cardiac  complications, renal failure, infection.  He is at slightly higher risk  for pneumonia postoperatively given his smoking history and chronic  cough.  Also I am going to clamp above his renal arteries temporarily to  remove the thrombus in order to perform an infrarenal bypass.  The  patient  understands all of this.  He is scheduled for Thursday, October  7.  He is going to get a carotid ultrasound in the office today.   Jorge Ny, MD  Electronically Signed   VWB/MEDQ  D:  07/23/2009  T:  07/24/2009  Job:  [id number]   cc:   Everardo Beals. Juanda Chance, MD, [facility]

## 2011-04-10 DIAGNOSIS — I471 Supraventricular tachycardia, unspecified: Secondary | ICD-10-CM

## 2011-04-10 DIAGNOSIS — I4891 Unspecified atrial fibrillation: Secondary | ICD-10-CM

## 2011-04-23 ENCOUNTER — Encounter: Payer: Self-pay | Admitting: Cardiology

## 2011-05-12 ENCOUNTER — Encounter: Payer: Self-pay | Admitting: Cardiology

## 2011-05-12 DIAGNOSIS — IMO0002 Reserved for concepts with insufficient information to code with codable children: Secondary | ICD-10-CM | POA: Insufficient documentation

## 2011-05-12 DIAGNOSIS — Z95828 Presence of other vascular implants and grafts: Secondary | ICD-10-CM | POA: Insufficient documentation

## 2011-05-12 DIAGNOSIS — I2510 Atherosclerotic heart disease of native coronary artery without angina pectoris: Secondary | ICD-10-CM | POA: Insufficient documentation

## 2011-05-12 DIAGNOSIS — R9430 Abnormal result of cardiovascular function study, unspecified: Secondary | ICD-10-CM | POA: Insufficient documentation

## 2011-05-12 DIAGNOSIS — I701 Atherosclerosis of renal artery: Secondary | ICD-10-CM | POA: Insufficient documentation

## 2011-05-12 DIAGNOSIS — I739 Peripheral vascular disease, unspecified: Secondary | ICD-10-CM | POA: Insufficient documentation

## 2011-05-12 DIAGNOSIS — Z720 Tobacco use: Secondary | ICD-10-CM | POA: Insufficient documentation

## 2011-05-12 DIAGNOSIS — M1990 Unspecified osteoarthritis, unspecified site: Secondary | ICD-10-CM | POA: Insufficient documentation

## 2011-05-12 DIAGNOSIS — J449 Chronic obstructive pulmonary disease, unspecified: Secondary | ICD-10-CM | POA: Insufficient documentation

## 2011-05-12 DIAGNOSIS — K219 Gastro-esophageal reflux disease without esophagitis: Secondary | ICD-10-CM | POA: Insufficient documentation

## 2011-05-12 DIAGNOSIS — E785 Hyperlipidemia, unspecified: Secondary | ICD-10-CM | POA: Insufficient documentation

## 2011-05-12 DIAGNOSIS — J939 Pneumothorax, unspecified: Secondary | ICD-10-CM | POA: Insufficient documentation

## 2011-05-12 DIAGNOSIS — I4891 Unspecified atrial fibrillation: Secondary | ICD-10-CM | POA: Insufficient documentation

## 2011-05-13 ENCOUNTER — Encounter: Payer: Self-pay | Admitting: Cardiology

## 2011-05-13 ENCOUNTER — Ambulatory Visit (INDEPENDENT_AMBULATORY_CARE_PROVIDER_SITE_OTHER): Payer: Medicare Other | Admitting: Cardiology

## 2011-05-13 DIAGNOSIS — Z720 Tobacco use: Secondary | ICD-10-CM

## 2011-05-13 DIAGNOSIS — J449 Chronic obstructive pulmonary disease, unspecified: Secondary | ICD-10-CM

## 2011-05-13 DIAGNOSIS — I4891 Unspecified atrial fibrillation: Secondary | ICD-10-CM

## 2011-05-13 DIAGNOSIS — I2510 Atherosclerotic heart disease of native coronary artery without angina pectoris: Secondary | ICD-10-CM

## 2011-05-13 DIAGNOSIS — F17200 Nicotine dependence, unspecified, uncomplicated: Secondary | ICD-10-CM

## 2011-05-13 NOTE — Assessment & Plan Note (Signed)
This is a significant problem for him.  He'll have early followup with his primary physician.

## 2011-05-13 NOTE — Assessment & Plan Note (Signed)
The patient had limited atrial fibrillation at the time of his aortobifemoral surgery.  He also had some atrial fibrillation with decompensated pulmonary status recently.  He is on Cardizem and I will keep him on this.  He does not need to be anticoagulated.  His systolic blood pressure was slightly elevated and the Cardizem will help with this.

## 2011-05-13 NOTE — Progress Notes (Signed)
HPI Patient is seen today for followup atrial fibrillation.  Kenneth Merritt had been hospitalized.  I have reviewed the hospital consultation and discharge summary.  I have reviewed the hospital echo report.  During that hospitalization Kenneth Merritt had decompensation of his pulmonary status.  While getting nebulizers Kenneth Merritt had some atrial fibrillation.  Kenneth Merritt converted spontaneously to sinus rhythm.  Two-dimensional echo revealed good left ventricular function.  It was felt that Kenneth Merritt did not need further workup.  His CHADS2 score is zero or one.  I have not recommended anticoagulation  Since being at home Kenneth Merritt has not had any marked palpitations.  Kenneth Merritt continues to have some respiratory difficulties and will be following up with his primary physician.  Kenneth Merritt's had no chest pain, syncope or presyncope. .No Known Allergies  Current Outpatient Prescriptions  Medication Sig Dispense Refill  . albuterol (VENTOLIN HFA) 108 (90 BASE) MCG/ACT inhaler Inhale 2 puffs into the lungs every 6 (six) hours as needed.        . ALPRAZolam (XANAX) 0.5 MG tablet Take 0.5 mg by mouth as needed.        [name] aspirin 325 MG EC tablet Take 325 mg by mouth daily.        . cyclobenzaprine (FLEXERIL) 10 MG tablet Take 10 mg by mouth as needed.        . diltiazem (CARDIZEM CD) 120 MG 24 hr capsule Take 1 tablet by mouth Daily.      . metoprolol tartrate (LOPRESSOR) 25 MG tablet Take 25 mg by mouth 2 (two) times daily.       . nitroGLYCERIN (NITROSTAT) 0.4 MG SL tablet Place 0.4 mg under the tongue every 5 (five) minutes as needed.        [name] omeprazole (PRILOSEC) 20 MG capsule Take 1 capsule by mouth Daily.      . simvastatin (ZOCOR) 40 MG tablet Take 40 mg by mouth at bedtime.        [name] DISCONTD: HYDROcodone-acetaminophen (VICODIN) 5-500 MG per tablet Take 1 tablet by mouth 2 (two) times daily as needed.        [name] DISCONTD: ranitidine (ZANTAC) 150 MG tablet Take 150 mg by mouth daily.          History   Social History  . Marital Status: Married    Spouse Name:  N/A    Number of Children: N/A  . Years of Education: N/A   Occupational History  . Not on file.   Social History Main Topics  . Smoking status: Former Smoker -- 2.0 packs/day for 40 years    Types: Cigarettes    Quit date: 01/11/2010  . Smokeless tobacco: Never Used  . Alcohol Use: No  . Drug Use: Not on file  . Sexually Active: Not on file   Other Topics Concern  . Not on file   Social History Narrative   Married, retired.     No family history on file.  Past Medical History  Diagnosis Date  . GERD (gastroesophageal reflux disease)   . COPD (chronic obstructive pulmonary disease)     severe by PFTs August 2010  . 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 Atrial fibrillation     post-op a-fib. 3/11. post Aortobifem , /  Atrial fibrillation from nebulizer treatment  May, 2012, while hospitalized  . Renal artery stenosis      presumably  corrected at time of aortobifem surgery March, 2011  . Tobacco user      stop smoking  . Pneumothorax      2011  before aortobifemoral surgery,  resolved  . Peripheral vascular disease, unspecified     bilateral intermittent claudication  . CAD (coronary artery disease), native coronary artery     catheterization 06/2009.Marland KitchenMarland Kitchen70% proximal LAD and 30% proximal RCA. normal LV function, no intervention planned prior to surgery for aorta. LV normal function  by catheter 8/10.   [name] Dyslipidemia   . DJD (degenerative joint disease)   . Ejection fraction     60%, echo, May, 2012, rapid atrial fib at the time    No past surgical history on file.  ROS  Patient denies fever, chills, headache, sweats, rash, change in vision, change in hearing, chest pain, , nausea vomiting, urinary symptoms.All other systems are reviewed and are negative.  PHYSICAL EXAM The patient is oriented to person time and place.  Affect is normal.  Kenneth Merritt has mild cough.  Head is  atraumatic.  Lungs reveal scattered rhonchi.  There is no jugular venous distention.  There is no respiratory distress.  Cardiac exam reveals S1 and S2.  No clicks or significant murmurs.  The abdomen is soft.  There is no peripheral edema.  There is no musculoskeletal deformities.  No skin rashes. Filed Vitals:   05/13/11 0953  BP: 145/87  Pulse: 61  Height: 5\' 4"  (1.626 m)  Weight: 134 lb (60.782 kg)    EKG Is done today and reviewed by me.  It is normal.  There is normal sinus rhythm.  ASSESSMENT & PLAN

## 2011-05-13 NOTE — Patient Instructions (Signed)
Continue all current medications. Your physician wants you to follow up in: 6 months.  You will receive a reminder letter in the mail one-two months in advance.  If you don't receive a letter, please call our office to schedule the follow up appointment   

## 2011-05-13 NOTE — Assessment & Plan Note (Signed)
The patient has stopped smoking and has not restarted.

## 2011-05-13 NOTE — Assessment & Plan Note (Signed)
Coronary disease is stable. No further workup at this time. 

## 2011-06-02 ENCOUNTER — Encounter (INDEPENDENT_AMBULATORY_CARE_PROVIDER_SITE_OTHER): Payer: Medicare Other

## 2011-06-02 DIAGNOSIS — I739 Peripheral vascular disease, unspecified: Secondary | ICD-10-CM

## 2011-06-02 DIAGNOSIS — Z48812 Encounter for surgical aftercare following surgery on the circulatory system: Secondary | ICD-10-CM

## 2011-08-08 LAB — CBC
HCT: 41
MCHC: 33.4
MCV: 89.5
Platelets: 227
RDW: 13.7
WBC: 12.1 — ABNORMAL HIGH

## 2011-08-08 LAB — POCT CARDIAC MARKERS: CKMB, poc: 1.3

## 2011-08-08 LAB — BASIC METABOLIC PANEL
BUN: 9
Calcium: 9.4
Chloride: 105
Creatinine, Ser: 0.6
GFR calc Af Amer: >60
GFR calc non Af Amer: >60

## 2011-08-08 LAB — DIFFERENTIAL
Eosinophils Absolute: 0.1
Eosinophils Relative: 1
Lymphs Abs: 2.6

## 2011-08-08 LAB — AMYLASE: Amylase: 58

## 2011-09-17 ENCOUNTER — Other Ambulatory Visit: Payer: Self-pay | Admitting: Cardiology

## 2011-11-11 DIAGNOSIS — C801 Malignant (primary) neoplasm, unspecified: Secondary | ICD-10-CM

## 2011-11-11 HISTORY — DX: Malignant (primary) neoplasm, unspecified: C80.1

## 2011-12-16 ENCOUNTER — Ambulatory Visit (INDEPENDENT_AMBULATORY_CARE_PROVIDER_SITE_OTHER): Payer: Medicare Other | Admitting: Urology

## 2011-12-16 DIAGNOSIS — C61 Malignant neoplasm of prostate: Secondary | ICD-10-CM

## 2012-02-10 ENCOUNTER — Other Ambulatory Visit: Payer: Self-pay

## 2012-02-12 DIAGNOSIS — R0602 Shortness of breath: Secondary | ICD-10-CM

## 2012-02-12 DIAGNOSIS — I4891 Unspecified atrial fibrillation: Secondary | ICD-10-CM

## 2012-02-20 ENCOUNTER — Ambulatory Visit (INDEPENDENT_AMBULATORY_CARE_PROVIDER_SITE_OTHER): Payer: Medicare Other | Admitting: *Deleted

## 2012-02-20 DIAGNOSIS — I4891 Unspecified atrial fibrillation: Secondary | ICD-10-CM

## 2012-02-20 DIAGNOSIS — Z7901 Long term (current) use of anticoagulants: Secondary | ICD-10-CM | POA: Insufficient documentation

## 2012-02-20 LAB — PROTIME-INR

## 2012-02-24 ENCOUNTER — Ambulatory Visit (INDEPENDENT_AMBULATORY_CARE_PROVIDER_SITE_OTHER): Payer: Medicare Other | Admitting: *Deleted

## 2012-02-24 DIAGNOSIS — Z7901 Long term (current) use of anticoagulants: Secondary | ICD-10-CM

## 2012-02-24 DIAGNOSIS — I4891 Unspecified atrial fibrillation: Secondary | ICD-10-CM

## 2012-02-24 LAB — POCT INR: INR: 2.1

## 2012-02-27 ENCOUNTER — Ambulatory Visit (INDEPENDENT_AMBULATORY_CARE_PROVIDER_SITE_OTHER): Payer: Medicare Other | Admitting: *Deleted

## 2012-02-27 DIAGNOSIS — Z7901 Long term (current) use of anticoagulants: Secondary | ICD-10-CM

## 2012-02-27 DIAGNOSIS — I4891 Unspecified atrial fibrillation: Secondary | ICD-10-CM

## 2012-02-27 MED ORDER — WARFARIN SODIUM 1 MG PO TABS: 1.0000 mg | ORAL_TABLET | Freq: Every day | ORAL | Status: DC

## 2012-03-02 ENCOUNTER — Emergency Department (HOSPITAL_COMMUNITY)
Admission: EM | Admit: 2012-03-02 | Discharge: 2012-03-03 | Disposition: A | Discharge status: Home / Self Care | Payer: Medicare Other | Attending: Emergency Medicine | Admitting: Emergency Medicine

## 2012-03-02 ENCOUNTER — Encounter (HOSPITAL_COMMUNITY): Payer: Self-pay

## 2012-03-02 DIAGNOSIS — R0602 Shortness of breath: Secondary | ICD-10-CM | POA: Insufficient documentation

## 2012-03-02 DIAGNOSIS — Z7982 Long term (current) use of aspirin: Secondary | ICD-10-CM | POA: Insufficient documentation

## 2012-03-02 DIAGNOSIS — R05 Cough: Secondary | ICD-10-CM | POA: Insufficient documentation

## 2012-03-02 DIAGNOSIS — I4891 Unspecified atrial fibrillation: Secondary | ICD-10-CM | POA: Insufficient documentation

## 2012-03-02 DIAGNOSIS — M545 Low back pain, unspecified: Secondary | ICD-10-CM | POA: Insufficient documentation

## 2012-03-02 DIAGNOSIS — J449 Chronic obstructive pulmonary disease, unspecified: Secondary | ICD-10-CM | POA: Insufficient documentation

## 2012-03-02 DIAGNOSIS — K219 Gastro-esophageal reflux disease without esophagitis: Secondary | ICD-10-CM | POA: Insufficient documentation

## 2012-03-02 DIAGNOSIS — R1013 Epigastric pain: Secondary | ICD-10-CM | POA: Insufficient documentation

## 2012-03-02 DIAGNOSIS — Z7901 Long term (current) use of anticoagulants: Secondary | ICD-10-CM | POA: Insufficient documentation

## 2012-03-02 DIAGNOSIS — R062 Wheezing: Secondary | ICD-10-CM | POA: Insufficient documentation

## 2012-03-02 DIAGNOSIS — Z79899 Other long term (current) drug therapy: Secondary | ICD-10-CM | POA: Insufficient documentation

## 2012-03-02 DIAGNOSIS — E785 Hyperlipidemia, unspecified: Secondary | ICD-10-CM | POA: Insufficient documentation

## 2012-03-02 DIAGNOSIS — I739 Peripheral vascular disease, unspecified: Secondary | ICD-10-CM | POA: Insufficient documentation

## 2012-03-02 DIAGNOSIS — J4489 Other specified chronic obstructive pulmonary disease: Secondary | ICD-10-CM | POA: Insufficient documentation

## 2012-03-02 DIAGNOSIS — R197 Diarrhea, unspecified: Secondary | ICD-10-CM | POA: Insufficient documentation

## 2012-03-02 DIAGNOSIS — I2510 Atherosclerotic heart disease of native coronary artery without angina pectoris: Secondary | ICD-10-CM | POA: Insufficient documentation

## 2012-03-02 DIAGNOSIS — R059 Cough, unspecified: Secondary | ICD-10-CM | POA: Insufficient documentation

## 2012-03-02 DIAGNOSIS — R6883 Chills (without fever): Secondary | ICD-10-CM | POA: Insufficient documentation

## 2012-03-02 NOTE — ED Provider Notes (Cosign Needed)
History   This chart was scribed for Kenneth Lennert, MD by Kenneth Merritt. The patient was seen in room APA10/APA10. Patient's care was started at 2326.   CSN: [id number]  Arrival date & time 03/02/12  2326   First MD Initiated Contact with Patient 03/02/12 2337      Chief Complaint  Patient presents with  . Shortness of Breath  . Abdominal Pain    (Consider location/radiation/quality/duration/timing/severity/associated sxs/prior treatment) Patient is a 61 y.o. male presenting with abdominal pain. The history is provided by the patient (pt complains of abd pain). No language interpreter was used.  Abdominal Pain The primary symptoms of the illness include abdominal pain. The primary symptoms of the illness do not include fever, fatigue or diarrhea. The current episode started 2 days ago. The onset of the illness was sudden. The problem has not changed since onset. The illness is associated with alcohol use. The patient states that she believes she is currently not pregnant. The patient has not had a change in bowel habit. Risk factors for an acute abdominal problem include being elderly. Additional symptoms associated with the illness include chills. Symptoms associated with the illness do not include hematuria, frequency or back pain. Significant associated medical issues include PUD.   OTTIE Merritt is a 61 y.o. male who presents to the Emergency Department complaining of constant severe abdominal pain onset two days ago with radiation to the lower back. Patient says a squeezing pain started two days ago and has been worsening since. Patient has had associated diarrhea with 1x episode a day and slight SOB. Denies fever.    Past Medical History  Diagnosis Date  . GERD (gastroesophageal reflux disease)   . COPD (chronic obstructive pulmonary disease)     severe by PFTs August 2010  . 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 Atrial fibrillation     post-op a-fib. 3/11. post Aortobifem , /  Atrial fibrillation from nebulizer treatment  May, 2012, while hospitalized  . Renal artery stenosis      presumably corrected at time of aortobifem surgery March, 2011  . Tobacco user      stop smoking  . Pneumothorax      2011  before aortobifemoral surgery,  resolved  . Peripheral vascular disease, unspecified     bilateral intermittent claudication  . CAD (coronary artery disease), native coronary artery     catheterization 06/2009.Kenneth KitchenMarland Kitchen70% proximal LAD and 30% proximal RCA. normal LV function, no intervention planned prior to surgery for aorta. LV normal function  by catheter 8/10.   Kenneth Merritt Dyslipidemia   . DJD (degenerative joint disease)   . Ejection fraction     60%, echo, May, 2012, rapid atrial fib at the time    History reviewed. No pertinent past surgical history.  No family history on file.  History  Substance Use Topics  . Smoking status: Former Smoker -- 2.0 packs/day for 40 years    Types: Cigarettes    Quit date: 01/11/2010  . Smokeless tobacco: Never Used  . Alcohol Use: No      Review of Systems  Constitutional: Positive for chills. Negative for fever and fatigue.  HENT: Negative for congestion, sinus pressure and ear discharge.   Eyes: Negative for discharge.  Respiratory: Positive for cough.   Cardiovascular: Negative for chest pain.  Gastrointestinal: Positive for abdominal pain.  Negative for diarrhea.  Genitourinary: Negative for frequency and hematuria.  Musculoskeletal: Negative for back pain.  Skin: Negative for rash.  Neurological: Negative for seizures and headaches.  Hematological: Negative.   Psychiatric/Behavioral: Negative for hallucinations.  All other systems reviewed and are negative.    Allergies  Review of patient's allergies indicates no known allergies.  Home Medications   Current Outpatient Rx  Name Route Sig Dispense Refill  .  ALBUTEROL SULFATE HFA 108 (90 BASE) MCG/ACT IN AERS Inhalation Inhale 2 puffs into the lungs every 6 (six) hours as needed.      . ALPRAZOLAM 0.5 MG PO TABS Oral Take 0.5 mg by mouth as needed.      . ASPIRIN 325 MG PO TBEC Oral Take 325 mg by mouth daily.      . CYCLOBENZAPRINE HCL 10 MG PO TABS Oral Take 10 mg by mouth as needed.      Kenneth Merritt DILTIAZEM HCL ER COATED BEADS 120 MG PO CP24 Oral Take 1 tablet by mouth Daily.    Kenneth Merritt METOPROLOL TARTRATE 25 MG PO TABS Oral Take 25 mg by mouth 2 (two) times daily.     Kenneth Merritt NITROGLYCERIN 0.4 MG SL SUBL Sublingual Place 0.4 mg under the tongue every 5 (five) minutes as needed.      Kenneth Merritt OMEPRAZOLE 20 MG PO CPDR Oral Take 1 capsule by mouth Daily.    Kenneth Merritt SIMVASTATIN 40 MG PO TABS  TAKE 1 TABLET BY MOUTH DAILY AT BEDTIME 30 tablet 6  . WARFARIN SODIUM 1 MG PO TABS Oral Take 1 tablet (1 mg total) by mouth daily. Or as directed 60 tablet 3    BP 149/92  Pulse 83  Temp(Src) 97.6 F (36.4 C) (Oral)  Resp 20  Ht 5\' 4"  (1.626 m)  Wt 145 lb (65.772 kg)  BMI 24.89 kg/m2  SpO2 97%  Physical Exam  Nursing note and vitals reviewed. Constitutional: He is oriented to person, place, and time. He appears well-developed and well-nourished.  HENT:  Head: Normocephalic and atraumatic.  Eyes: Conjunctivae and EOM are normal. Pupils are equal, round, and reactive to light. No scleral icterus.  Neck: Normal range of motion. Neck supple. No thyromegaly present.  Cardiovascular: Normal rate, regular rhythm and normal heart sounds.  Exam reveals no gallop and no friction rub.   No murmur heard. Pulmonary/Chest: Effort normal. No stridor. He has wheezes (bilaterally). He has no rales. He exhibits no tenderness.  Abdominal: Soft. Bowel sounds are normal. He exhibits no distension. There is tenderness in the epigastric area. There is no rebound.       Midline abdominal scar consistent with Lower aortic bypass surgery.   Musculoskeletal: Normal range of motion. He exhibits no edema.    Lymphadenopathy:    He has no cervical adenopathy.  Neurological: He is alert and oriented to person, place, and time. Coordination normal.  Skin: Skin is warm and dry. No rash noted. No erythema.  Psychiatric: He has a normal mood and affect. His behavior is normal.    ED Course  Procedures (including critical care time) DIAGNOSTIC STUDIES: Oxygen Saturation is 97% on room air, normal by my interpretation.    COORDINATION OF CARE: 11:45PM- Patient informed of current plan for treatment and evaluation and agrees with plan at this time.     Labs Reviewed - No data to display No results found.   No diagnosis found. Pt improved with tx   MDM  pud  Kenneth Lennert, MD 03/03/12 [phone]

## 2012-03-02 NOTE — ED Notes (Signed)
Pt reports hx of COPD.  Pt reports sob that has worsened since Sunday.  Pt also reports abd pain that radiates towards his back.

## 2012-03-03 ENCOUNTER — Emergency Department (HOSPITAL_COMMUNITY): Payer: Medicare Other

## 2012-03-03 LAB — COMPREHENSIVE METABOLIC PANEL
ALT: 29 U/L (ref 0–53)
Albumin: 3.2 g/dL — ABNORMAL LOW (ref 3.5–5.2)
Alkaline Phosphatase: 84 U/L (ref 39–117)
Potassium: 3.5 mEq/L (ref 3.5–5.1)
Sodium: 141 mEq/L (ref 135–145)
Total Protein: 6.4 g/dL (ref 6.0–8.3)

## 2012-03-03 LAB — DIFFERENTIAL
Basophils Absolute: 0 10*3/uL (ref 0.0–0.1)
Basophils Relative: 0 % (ref 0–1)
Eosinophils Absolute: 0.1 10*3/uL (ref 0.0–0.7)
Lymphs Abs: 2.4 10*3/uL (ref 0.7–4.0)
Neutrophils Relative %: 58 % (ref 43–77)

## 2012-03-03 LAB — CBC
MCH: 29.3 pg (ref 26.0–34.0)
MCHC: 31.9 g/dL (ref 30.0–36.0)
Platelets: 257 10*3/uL (ref 150–400)
RBC: 4.34 MIL/uL (ref 4.22–5.81)

## 2012-03-03 MED ORDER — PROMETHAZINE HCL 25 MG PO TABS: 25.0000 mg | ORAL_TABLET | Freq: Four times a day (QID) | ORAL | Status: DC | PRN

## 2012-03-03 MED ORDER — IOHEXOL 350 MG/ML SOLN
100.0000 mL | Freq: Once | INTRAVENOUS | Status: AC | PRN
  Administered 2012-03-03: 100 mL via INTRAVENOUS

## 2012-03-03 MED ORDER — ONDANSETRON HCL 4 MG/2ML IJ SOLN
4.0000 mg | Freq: Once | INTRAMUSCULAR | Status: AC
  Administered 2012-03-03: 4 mg via INTRAVENOUS
  Filled 2012-03-03: qty 2

## 2012-03-03 MED ORDER — HYDROMORPHONE HCL PF 1 MG/ML IJ SOLN
1.0000 mg | Freq: Once | INTRAMUSCULAR | Status: AC
  Administered 2012-03-03: 1 mg via INTRAVENOUS
  Filled 2012-03-03: qty 1

## 2012-03-03 MED ORDER — HYDROCODONE-ACETAMINOPHEN 5-325 MG PO TABS: 1.0000 | ORAL_TABLET | Freq: Four times a day (QID) | ORAL | Status: AC | PRN

## 2012-03-03 NOTE — Discharge Instructions (Signed)
Follow up with your md as scheduled °

## 2012-03-05 ENCOUNTER — Ambulatory Visit (INDEPENDENT_AMBULATORY_CARE_PROVIDER_SITE_OTHER): Payer: Medicare Other | Admitting: *Deleted

## 2012-03-05 DIAGNOSIS — Z7901 Long term (current) use of anticoagulants: Secondary | ICD-10-CM

## 2012-03-05 DIAGNOSIS — I4891 Unspecified atrial fibrillation: Secondary | ICD-10-CM

## 2012-03-08 ENCOUNTER — Encounter: Payer: Self-pay | Admitting: *Deleted

## 2012-03-09 ENCOUNTER — Ambulatory Visit (INDEPENDENT_AMBULATORY_CARE_PROVIDER_SITE_OTHER): Payer: Medicare Other | Admitting: *Deleted

## 2012-03-09 DIAGNOSIS — I4891 Unspecified atrial fibrillation: Secondary | ICD-10-CM

## 2012-03-09 DIAGNOSIS — Z7901 Long term (current) use of anticoagulants: Secondary | ICD-10-CM

## 2012-03-11 ENCOUNTER — Encounter: Payer: Medicare Other | Admitting: Physician Assistant

## 2012-03-23 ENCOUNTER — Ambulatory Visit (INDEPENDENT_AMBULATORY_CARE_PROVIDER_SITE_OTHER): Payer: Medicare Other | Admitting: *Deleted

## 2012-03-23 DIAGNOSIS — I4891 Unspecified atrial fibrillation: Secondary | ICD-10-CM

## 2012-03-23 DIAGNOSIS — Z7901 Long term (current) use of anticoagulants: Secondary | ICD-10-CM

## 2012-04-06 ENCOUNTER — Ambulatory Visit (INDEPENDENT_AMBULATORY_CARE_PROVIDER_SITE_OTHER): Payer: Medicare Other | Admitting: *Deleted

## 2012-04-06 DIAGNOSIS — Z7901 Long term (current) use of anticoagulants: Secondary | ICD-10-CM

## 2012-04-06 DIAGNOSIS — I4891 Unspecified atrial fibrillation: Secondary | ICD-10-CM

## 2012-04-06 LAB — POCT INR: INR: 2.7

## 2012-04-13 ENCOUNTER — Ambulatory Visit (INDEPENDENT_AMBULATORY_CARE_PROVIDER_SITE_OTHER): Payer: Medicare Other | Admitting: Urology

## 2012-04-13 DIAGNOSIS — N529 Male erectile dysfunction, unspecified: Secondary | ICD-10-CM

## 2012-04-13 DIAGNOSIS — C61 Malignant neoplasm of prostate: Secondary | ICD-10-CM

## 2012-04-24 ENCOUNTER — Other Ambulatory Visit: Payer: Self-pay | Admitting: Cardiology

## 2012-05-04 ENCOUNTER — Ambulatory Visit (INDEPENDENT_AMBULATORY_CARE_PROVIDER_SITE_OTHER): Payer: Medicare Other | Admitting: *Deleted

## 2012-05-04 DIAGNOSIS — I4891 Unspecified atrial fibrillation: Secondary | ICD-10-CM

## 2012-05-04 DIAGNOSIS — Z7901 Long term (current) use of anticoagulants: Secondary | ICD-10-CM

## 2012-05-04 LAB — POCT INR: INR: 2.8

## 2012-06-01 ENCOUNTER — Ambulatory Visit (INDEPENDENT_AMBULATORY_CARE_PROVIDER_SITE_OTHER): Payer: Medicare Other | Admitting: *Deleted

## 2012-06-01 DIAGNOSIS — Z7901 Long term (current) use of anticoagulants: Secondary | ICD-10-CM

## 2012-06-01 DIAGNOSIS — I4891 Unspecified atrial fibrillation: Secondary | ICD-10-CM

## 2012-06-22 ENCOUNTER — Ambulatory Visit (INDEPENDENT_AMBULATORY_CARE_PROVIDER_SITE_OTHER): Payer: Medicare Other | Admitting: *Deleted

## 2012-06-22 ENCOUNTER — Other Ambulatory Visit: Payer: Self-pay | Admitting: Cardiology

## 2012-06-22 DIAGNOSIS — I4891 Unspecified atrial fibrillation: Secondary | ICD-10-CM

## 2012-06-22 DIAGNOSIS — Z7901 Long term (current) use of anticoagulants: Secondary | ICD-10-CM

## 2012-07-13 ENCOUNTER — Ambulatory Visit (INDEPENDENT_AMBULATORY_CARE_PROVIDER_SITE_OTHER): Payer: Medicare Other | Admitting: *Deleted

## 2012-07-13 DIAGNOSIS — I4891 Unspecified atrial fibrillation: Secondary | ICD-10-CM

## 2012-07-13 DIAGNOSIS — Z7901 Long term (current) use of anticoagulants: Secondary | ICD-10-CM

## 2012-07-13 LAB — POCT INR: INR: 2.8

## 2012-07-18 DIAGNOSIS — R0602 Shortness of breath: Secondary | ICD-10-CM

## 2012-07-21 ENCOUNTER — Telehealth: Payer: Self-pay | Admitting: *Deleted

## 2012-07-21 NOTE — Telephone Encounter (Signed)
Pt discharged from [facility] this am for bronchitis.  Started on prednisone taper and levaquin x 5 days today.  Told pt to decrease coumadin to 2 tablets daily and appt made to recheck INR on 9/17.  Pt in agreement.

## 2012-07-21 NOTE — Telephone Encounter (Signed)
Patient would like return phone call / tg  °

## 2012-07-27 ENCOUNTER — Ambulatory Visit (INDEPENDENT_AMBULATORY_CARE_PROVIDER_SITE_OTHER): Payer: Medicare Other | Admitting: *Deleted

## 2012-07-27 DIAGNOSIS — I4891 Unspecified atrial fibrillation: Secondary | ICD-10-CM

## 2012-07-27 DIAGNOSIS — Z7901 Long term (current) use of anticoagulants: Secondary | ICD-10-CM

## 2012-08-10 ENCOUNTER — Ambulatory Visit (INDEPENDENT_AMBULATORY_CARE_PROVIDER_SITE_OTHER): Payer: Medicare Other | Admitting: *Deleted

## 2012-08-10 DIAGNOSIS — Z7901 Long term (current) use of anticoagulants: Secondary | ICD-10-CM

## 2012-08-10 DIAGNOSIS — I4891 Unspecified atrial fibrillation: Secondary | ICD-10-CM

## 2012-08-10 LAB — POCT INR: INR: 1.8

## 2012-08-27 ENCOUNTER — Ambulatory Visit (INDEPENDENT_AMBULATORY_CARE_PROVIDER_SITE_OTHER): Payer: Medicare Other | Admitting: *Deleted

## 2012-08-27 DIAGNOSIS — I4891 Unspecified atrial fibrillation: Secondary | ICD-10-CM

## 2012-08-27 DIAGNOSIS — Z7901 Long term (current) use of anticoagulants: Secondary | ICD-10-CM

## 2012-08-27 LAB — POCT INR: INR: 2

## 2012-09-17 ENCOUNTER — Encounter: Payer: Self-pay | Admitting: Cardiology

## 2012-09-20 ENCOUNTER — Ambulatory Visit (INDEPENDENT_AMBULATORY_CARE_PROVIDER_SITE_OTHER): Payer: Medicare Other | Admitting: Cardiology

## 2012-09-20 ENCOUNTER — Encounter: Payer: Self-pay | Admitting: Cardiology

## 2012-09-20 ENCOUNTER — Encounter: Payer: Self-pay | Admitting: Vascular Surgery

## 2012-09-20 VITALS — BP 129/86 | HR 75 | Temp 97.8°F | Ht 64.0 in | Wt 143.0 lb

## 2012-09-20 DIAGNOSIS — I2510 Atherosclerotic heart disease of native coronary artery without angina pectoris: Secondary | ICD-10-CM

## 2012-09-20 DIAGNOSIS — Z95828 Presence of other vascular implants and grafts: Secondary | ICD-10-CM

## 2012-09-20 DIAGNOSIS — I4891 Unspecified atrial fibrillation: Secondary | ICD-10-CM

## 2012-09-20 DIAGNOSIS — Z9889 Other specified postprocedural states: Secondary | ICD-10-CM

## 2012-09-20 DIAGNOSIS — Z7901 Long term (current) use of anticoagulants: Secondary | ICD-10-CM

## 2012-09-20 DIAGNOSIS — I701 Atherosclerosis of renal artery: Secondary | ICD-10-CM

## 2012-09-20 DIAGNOSIS — J449 Chronic obstructive pulmonary disease, unspecified: Secondary | ICD-10-CM

## 2012-09-20 MED ORDER — NITROGLYCERIN 0.4 MG SL SUBL: 0.4000 mg | SUBLINGUAL_TABLET | SUBLINGUAL | Status: DC | PRN

## 2012-09-20 NOTE — Progress Notes (Signed)
HPI  Patient is seen to followup atrial fibrillation. He has known coronary disease. Catheterization was done in August, 2010. He had good LV function. No intervention was needed. He went on to have vascular surgery for his totally occluded aorta. He's doing relatively well. He has atrial fibrillation usually when he is decompensated in the hospital. This happened in the past with a bout of pneumonia. I saw him last July, 2012. I have reviewed records since then. He was hospitalized in April, 2013 with pneumonia. He did have rapid atrial fibrillation. This converted back to sinus. He's been on Coumadin.  He mentions today that he would like to change to a anticoagulant other than Coumadin. He is a candidate for other meds.  No Known Allergies  Current Outpatient Prescriptions  Medication Sig Dispense Refill  . albuterol (PROVENTIL) (2.5 MG/3ML) 0.083% nebulizer solution Take 2.5 mg by nebulization 3 (three) times daily as needed.      [name] albuterol (VENTOLIN HFA) 108 (90 BASE) MCG/ACT inhaler Inhale 2 puffs into the lungs every 6 (six) hours as needed.        . ALPRAZolam (XANAX) 0.5 MG tablet Take 0.5 mg by mouth 3 (three) times daily as needed.       [name] aspirin EC 81 MG tablet Take 81 mg by mouth daily.      [name] HYDROcodone-acetaminophen (VICODIN) 5-500 MG per tablet Take 1 tablet by mouth Twice daily as needed.      . metoprolol (LOPRESSOR) 50 MG tablet Take 50 mg by mouth 2 (two) times daily.      . nitroGLYCERIN (NITROSTAT) 0.4 MG SL tablet Place 0.4 mg under the tongue every 5 (five) minutes as needed.        [name] omeprazole (PRILOSEC) 20 MG capsule Take 1 capsule by mouth Daily.      . simvastatin (ZOCOR) 40 MG tablet TAKE 1 TABLET BY MOUTH DAILY AT BEDTIME  30 tablet  6  . tiZANidine (ZANAFLEX) 4 MG tablet Take 1 tablet by mouth Twice daily as needed.      . warfarin (COUMADIN) 1 MG tablet TAKE 1 TABLET BY MOUTH EVERY DAY OR AS DIRECTED  60 tablet  3  . promethazine (PHENERGAN) 25 MG tablet  Take 1 tablet (25 mg total) by mouth every 6 (six) hours as needed for nausea.  15 tablet  0    History   Social History  . Marital Status: Married    Spouse Name: N/A    Number of Children: N/A  . Years of Education: N/A   Occupational History  . Not on file.   Social History Main Topics  . Smoking status: Former Smoker -- 2.0 packs/day for 40 years    Types: Cigarettes    Quit date: 01/11/2010  . Smokeless tobacco: Never Used  . Alcohol Use: No  . Drug Use: Not on file  . Sexually Active: Not on file   Other Topics Concern  . Not on file   Social History Narrative   Married, retired.     No family history on file.  Past Medical History  Diagnosis Date  . GERD (gastroesophageal reflux disease)   . COPD (chronic obstructive pulmonary disease)     severe by PFTs August 2010  . 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 Atrial fibrillation     post-op a-fib. 3/11. post  Aortobifem , /  Atrial fibrillation from nebulizer treatment  May, 2012, while hospitalized  . Renal artery stenosis      presumably corrected at time of aortobifem surgery March, 2011  . Tobacco user      stop smoking  . Pneumothorax      2011  before aortobifemoral surgery,  resolved  . Peripheral vascular disease, unspecified     bilateral intermittent claudication  . CAD (coronary artery disease), native coronary artery     catheterization 06/2009.Marland KitchenMarland Kitchen70% proximal LAD and 30% proximal RCA. normal LV function, no intervention planned prior to surgery for aorta. LV normal function  by catheter 8/10.   [name] Dyslipidemia   . DJD (degenerative joint disease)   . Ejection fraction     60%, echo, May, 2012, rapid atrial fib at the time    Past Surgical History  Procedure Date  . Abdominal aortic aneurysm repair 01/11/2010  . Inguinal hernia repair     RIGHT    Patient Active Problem List  Diagnosis  . GERD  (gastroesophageal reflux disease)  . COPD (chronic obstructive pulmonary disease)  . Pneumothorax  . Peripheral vascular disease, unspecified  . Dyslipidemia  . DJD (degenerative joint disease)  . S/P aortobifemoral bypass surgery  . Renal artery stenosis  . Tobacco user  . CAD (coronary artery disease), native coronary artery  . Atrial fibrillation  . Ejection fraction  . Encounter for long-term (current) use of anticoagulants    ROS   Patient denies fever, chills, headache, sweats, rash, change in vision, change in hearing, chest pain, nausea vomiting, urinary symptoms. He has a cough at baseline most of the time.  PHYSICAL EXAM  Patient is oriented to person time and place. Affect is normal. There is no jugulovenous distention. Lungs reveal scattered rhonchi. He does have a cough today. There is no respiratory distress. Cardiac exam reveals S1 and S2. There no clicks or significant murmurs. Abdomen is soft. There is no peripheral edema. There no musculoskeletal deformities. There there are no skin rashes.  Filed Vitals:   09/20/12 1317  BP: 129/86  Pulse: 75  Temp: 97.8 F (36.6 C)  Height: 5\' 4"  (1.626 m)  Weight: 143 lb (64.864 kg)  SpO2: 97%   EKG is done today and reviewed by me. There is normal sinus rhythm. There is no significant QRS change. There is no change from the past.  ASSESSMENT & PLAN

## 2012-09-20 NOTE — Assessment & Plan Note (Signed)
The patient had aortobifemoral bypass surgery in March, 2011. He had total occlusion of his aorta at that time. He has done well.

## 2012-09-20 NOTE — Assessment & Plan Note (Signed)
Patient has very significant lung disease. This is probably his major problem. He has a chronic cough. He is stable today.

## 2012-09-20 NOTE — Assessment & Plan Note (Signed)
The patient has been on Coumadin. He would like to change to one of the other medications. I will give him either Xarelto 20 or Eliquis 5 BID.

## 2012-09-20 NOTE — Assessment & Plan Note (Signed)
The patient does have scattered coronary disease proven by catheterization. He has never had an intervention. He is not having any significant symptoms at this time.

## 2012-09-20 NOTE — Assessment & Plan Note (Signed)
His renal artery stenosis was corrected at the time of his vascular surgery.

## 2012-09-20 NOTE — Assessment & Plan Note (Signed)
The patient has atrial fib primarily when he is in the hospital in a stress situation. He had an episode of atrial fib with his pneumonia in April, 2013. He is in sinus today. It is important that he continue his anticoagulation.

## 2012-09-20 NOTE — Patient Instructions (Addendum)
Your physician recommends that you schedule a follow-up appointment in: 1 year. You will receive a reminder letter in the mail in about 10 months reminding you to call and schedule your appointment. If you don't receive this letter, please contact our office. Your physician recommends that you continue on your current medications as directed. Please refer to the Current Medication list given to you today.  Please discuss with Kenneth Merritt about changing your warfarin to a different type of anticoagulant.  Please send your application to our office for your handicap sticker.

## 2012-09-21 ENCOUNTER — Encounter (INDEPENDENT_AMBULATORY_CARE_PROVIDER_SITE_OTHER): Payer: Medicare Other

## 2012-09-21 ENCOUNTER — Ambulatory Visit (INDEPENDENT_AMBULATORY_CARE_PROVIDER_SITE_OTHER): Payer: Medicaid Other | Admitting: Urology

## 2012-09-21 DIAGNOSIS — R0989 Other specified symptoms and signs involving the circulatory and respiratory systems: Secondary | ICD-10-CM

## 2012-09-21 DIAGNOSIS — C61 Malignant neoplasm of prostate: Secondary | ICD-10-CM

## 2012-09-21 DIAGNOSIS — N529 Male erectile dysfunction, unspecified: Secondary | ICD-10-CM

## 2012-09-24 ENCOUNTER — Ambulatory Visit (INDEPENDENT_AMBULATORY_CARE_PROVIDER_SITE_OTHER): Payer: Medicare Other | Admitting: *Deleted

## 2012-09-24 DIAGNOSIS — I4891 Unspecified atrial fibrillation: Secondary | ICD-10-CM

## 2012-09-24 DIAGNOSIS — Z7901 Long term (current) use of anticoagulants: Secondary | ICD-10-CM

## 2012-10-18 ENCOUNTER — Other Ambulatory Visit: Payer: Self-pay | Admitting: *Deleted

## 2012-10-18 DIAGNOSIS — I739 Peripheral vascular disease, unspecified: Secondary | ICD-10-CM

## 2012-10-18 DIAGNOSIS — Z48812 Encounter for surgical aftercare following surgery on the circulatory system: Secondary | ICD-10-CM

## 2012-10-19 ENCOUNTER — Other Ambulatory Visit: Payer: Self-pay | Admitting: *Deleted

## 2012-10-19 ENCOUNTER — Encounter: Payer: Self-pay | Admitting: Neurosurgery

## 2012-10-19 MED ORDER — APIXABAN 5 MG PO TABS: 5.0000 mg | ORAL_TABLET | Freq: Two times a day (BID) | ORAL | Status: DC

## 2012-10-19 NOTE — Telephone Encounter (Signed)
Received call from [facility] pharmacy that patient needed rx for eliquis.

## 2012-10-20 ENCOUNTER — Encounter (INDEPENDENT_AMBULATORY_CARE_PROVIDER_SITE_OTHER): Payer: Medicare Other | Admitting: *Deleted

## 2012-10-20 ENCOUNTER — Encounter: Payer: Self-pay | Admitting: Neurosurgery

## 2012-10-20 ENCOUNTER — Ambulatory Visit (INDEPENDENT_AMBULATORY_CARE_PROVIDER_SITE_OTHER): Payer: Medicare Other | Admitting: Neurosurgery

## 2012-10-20 VITALS — BP 108/77 | HR 75 | Resp 16 | Ht 64.0 in | Wt 144.6 lb

## 2012-10-20 DIAGNOSIS — I739 Peripheral vascular disease, unspecified: Secondary | ICD-10-CM

## 2012-10-20 DIAGNOSIS — Z48812 Encounter for surgical aftercare following surgery on the circulatory system: Secondary | ICD-10-CM

## 2012-10-20 NOTE — Progress Notes (Addendum)
[facility] P[facility]/PVD Office Note  CC: PVD surveillance Referring Physician: Brabham  History of Present Illness: 61 year old male patient of Dr. Myra Gianotti status post aortobifem bypass graft in March 2011. The patient denies claudication, rest pain, abdominal pain. The patient has no open ulcerations on his lower extremities. The patient denies any new medical diagnoses or recent surgery.  Past Medical History  Diagnosis Date  . GERD (gastroesophageal reflux disease)   . COPD (chronic obstructive pulmonary disease)     severe by PFTs August 2010  . 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 Atrial fibrillation     post-op a-fib. 3/11. post Aortobifem , /  Atrial fibrillation from nebulizer treatment  May, 2012, while hospitalized  . Renal artery stenosis      presumably corrected at time of aortobifem surgery March, 2011  . Tobacco user      stop smoking  . Pneumothorax      2011  before aortobifemoral surgery,  resolved  . Peripheral vascular disease, unspecified     bilateral intermittent claudication  . CAD (coronary artery disease), native coronary artery     catheterization 06/2009.Marland KitchenMarland Kitchen70% proximal LAD and 30% proximal RCA. normal LV function, no intervention planned prior to surgery for aorta. LV normal function  by catheter 8/10.   [name] Dyslipidemia   . DJD (degenerative joint disease)   . Ejection fraction     60%, echo, May, 2012, rapid atrial fib at the time  . Cancer 2013    Prostate Radiation    ROS: [x]  Positive   [ ]  Denies    General: [ ]  Weight loss, [ ]  Fever, [ ]  chills Neurologic: [x ] Dizziness, [ ]  Blackouts, [ ]  Seizure [ ]  Stroke, [ ]  "Mini stroke", [ ]  Slurred speech, [ ]  Temporary blindness; [ ]  weakness in arms or legs, [ ]  Hoarseness Cardiac: [ ]  Chest pain/pressure, [x ] Shortness of breath at rest [x ] Shortness of breath with exertion, [ ]   Atrial fibrillation or irregular heartbeat Vascular: [ ]  Pain in legs with walking, [ ]  Pain in legs at rest, [ ]  Pain in legs at night,  [ ]  Non-healing ulcer, [ ]  Blood clot in vein/DVT,   Pulmonary: [ ]  Home oxygen, [x ] Productive cough, [ ]  Coughing up blood, [ ]  Asthma,  [x ] Wheezing Musculoskeletal:  [ ]  Arthritis, [ ]  Low back pain, [ ]  Joint pain Hematologic: [ ]  Easy Bruising, [ ]  Anemia; [ ]  Hepatitis Gastrointestinal: [ ]  Blood in stool, [ ]  Gastroesophageal Reflux/heartburn, [ ]  Trouble swallowing Urinary: [ ]  chronic Kidney disease, [ ]  on HD - [ ]  MWF or [ ]  TTHS, [ ]  Burning with urination, [ ]  Difficulty urinating Skin: [ ]  Rashes, [ ]  Wounds Psychological: [ ]  Anxiety, [ ]  Depression   Social History History  Substance Use Topics  . Smoking status: Former Smoker -- 2.0 packs/day for 40 years    Types: Cigarettes    Quit date: 01/11/2010  . Smokeless tobacco: Never Used  . Alcohol Use: No    Family History Family History  Problem Relation Age of Onset  . Cancer Father     prostate    No Known Allergies  Current Outpatient Prescriptions  Medication Sig Dispense Refill  . albuterol (PROVENTIL) (2.5 MG/3ML) 0.083% nebulizer solution Take 2.5 mg by  nebulization 3 (three) times daily as needed.      [name] albuterol (VENTOLIN HFA) 108 (90 BASE) MCG/ACT inhaler Inhale 2 puffs into the lungs every 6 (six) hours as needed.        . ALPRAZolam (XANAX) 0.5 MG tablet Take 0.5 mg by mouth 3 (three) times daily as needed.       [name] apixaban (ELIQUIS) 5 MG TABS tablet Take 1 tablet (5 mg total) by mouth 2 (two) times daily.  60 tablet  3  . aspirin EC 81 MG tablet Take 81 mg by mouth daily.      [name] HYDROcodone-acetaminophen (VICODIN) 5-500 MG per tablet Take 1 tablet by mouth Twice daily as needed.      . metoprolol (LOPRESSOR) 50 MG tablet Take 50 mg by mouth 2 (two) times daily.      . nitroGLYCERIN (NITROSTAT) 0.4 MG SL tablet Place 1 tablet (0.4 mg total) under the tongue  every 5 (five) minutes as needed.  25 tablet  3  . omeprazole (PRILOSEC) 20 MG capsule Take 1 capsule by mouth Daily.      . simvastatin (ZOCOR) 40 MG tablet TAKE 1 TABLET BY MOUTH DAILY AT BEDTIME  30 tablet  6  . tiZANidine (ZANAFLEX) 4 MG tablet Take 1 tablet by mouth Twice daily as needed.      . promethazine (PHENERGAN) 25 MG tablet Take 1 tablet (25 mg total) by mouth every 6 (six) hours as needed for nausea.  15 tablet  0    Physical Examination  Filed Vitals:   10/20/12 1131  BP: 108/77  Pulse: 75  Resp: 16    Body mass index is 24.82 kg/(m^2).  General:  WDWN in NAD Gait: Normal HEENT: WNL Eyes: Pupils equal Pulmonary: normal non-labored breathing , without Rales, rhonchi,  wheezing Cardiac: RRR, without  Murmurs, rubs or gallops; No carotid bruits Abdomen: soft, NT, no masses Skin: no rashes, ulcers noted Vascular Exam/Pulses: Palpable lower extremity pulses bilaterally, 3+ radial pulses, no bruits are heard  Extremities without ischemic changes, no Gangrene , no cellulitis; no open wounds;  Musculoskeletal: no muscle wasting or atrophy  Neurologic: A&O X 3; Appropriate Affect ; SENSATION: normal; MOTOR FUNCTION:  moving all extremities equally. Speech is fluent/normal  Non-Invasive Vascular Imaging: ABIs today are 1.02 and biphasic PT and triphasic DP on the right, 1.10 with biphasic PT, triphasic DP on the left  ASSESSMENT/PLAN: Stable ABIs from previous exam, the patient will followup in one year with repeat ABIs. The patient's questions were encouraged and answered, he is in agreement with this plan.  Lauree Chandler ANP  Clinic M.D.: Edilia Bo

## 2012-10-20 NOTE — Addendum Note (Signed)
Addended by: Sharee Pimple on: 10/20/2012 03:18 PM   Modules accepted: Orders

## 2012-12-26 ENCOUNTER — Encounter: Payer: Self-pay | Admitting: Cardiology

## 2012-12-28 ENCOUNTER — Other Ambulatory Visit: Payer: Self-pay

## 2012-12-28 MED ORDER — SIMVASTATIN 40 MG PO TABS: 40.0000 mg | ORAL_TABLET | Freq: Every day | ORAL | Status: DC

## 2012-12-31 ENCOUNTER — Encounter: Payer: Self-pay | Admitting: Cardiology

## 2012-12-31 ENCOUNTER — Ambulatory Visit (INDEPENDENT_AMBULATORY_CARE_PROVIDER_SITE_OTHER): Payer: Medicare Other | Admitting: *Deleted

## 2012-12-31 DIAGNOSIS — I4891 Unspecified atrial fibrillation: Secondary | ICD-10-CM

## 2012-12-31 DIAGNOSIS — Z7901 Long term (current) use of anticoagulants: Secondary | ICD-10-CM

## 2012-12-31 NOTE — Patient Instructions (Signed)
Pt here today for 1 month f/u after starting Eliquis.  He denies any complications or s/s of bleeding since changing to this drug.  He will continue on Eliquis and has refills.  CBC and BMET were ordered today [facility]) to f/u on hgb/hct and renal function since starting new agent.  Will see pt back in 6 months.

## 2013-01-25 ENCOUNTER — Ambulatory Visit: Payer: Medicare Other | Admitting: Urology

## 2013-02-15 ENCOUNTER — Ambulatory Visit (INDEPENDENT_AMBULATORY_CARE_PROVIDER_SITE_OTHER): Payer: Medicare Other | Admitting: Urology

## 2013-02-15 DIAGNOSIS — C61 Malignant neoplasm of prostate: Secondary | ICD-10-CM

## 2013-02-18 ENCOUNTER — Other Ambulatory Visit: Payer: Self-pay

## 2013-02-18 MED ORDER — APIXABAN 5 MG PO TABS: 5.0000 mg | ORAL_TABLET | Freq: Two times a day (BID) | ORAL | Status: DC

## 2013-05-18 ENCOUNTER — Encounter: Payer: Self-pay | Admitting: Cardiology

## 2013-06-28 ENCOUNTER — Ambulatory Visit (INDEPENDENT_AMBULATORY_CARE_PROVIDER_SITE_OTHER): Payer: Medicare Other | Admitting: Urology

## 2013-06-28 DIAGNOSIS — N529 Male erectile dysfunction, unspecified: Secondary | ICD-10-CM

## 2013-06-28 DIAGNOSIS — C61 Malignant neoplasm of prostate: Secondary | ICD-10-CM

## 2013-07-31 ENCOUNTER — Other Ambulatory Visit: Payer: Self-pay | Admitting: Cardiology

## 2013-09-25 ENCOUNTER — Other Ambulatory Visit: Payer: Self-pay | Admitting: Cardiology

## 2013-09-27 ENCOUNTER — Ambulatory Visit: Payer: Medicare Other | Admitting: Urology

## 2013-10-19 ENCOUNTER — Encounter: Payer: Self-pay | Admitting: Cardiology

## 2013-10-24 ENCOUNTER — Ambulatory Visit: Payer: Medicare Other | Admitting: Neurosurgery

## 2013-10-26 ENCOUNTER — Other Ambulatory Visit: Payer: Self-pay | Admitting: Cardiology

## 2013-10-26 ENCOUNTER — Telehealth: Payer: Self-pay | Admitting: Cardiology

## 2013-10-26 MED ORDER — APIXABAN 5 MG PO TABS: ORAL_TABLET | ORAL | Status: DC

## 2013-10-26 NOTE — Telephone Encounter (Signed)
Tried to contact patient by phone  - kept getting disconnected.  Mailed letter

## 2013-10-31 ENCOUNTER — Other Ambulatory Visit: Payer: Self-pay | Admitting: Cardiology

## 2013-10-31 ENCOUNTER — Ambulatory Visit: Payer: Medicare Other | Admitting: Family

## 2013-10-31 ENCOUNTER — Encounter: Payer: Self-pay | Admitting: Cardiology

## 2013-10-31 ENCOUNTER — Encounter (HOSPITAL_COMMUNITY): Payer: Medicare Other

## 2013-10-31 MED ORDER — APIXABAN 5 MG PO TABS: ORAL_TABLET | ORAL | Status: DC

## 2013-11-01 ENCOUNTER — Ambulatory Visit: Payer: Medicare Other | Admitting: Urology

## 2013-11-01 ENCOUNTER — Encounter: Payer: Self-pay | Admitting: Family

## 2013-11-01 ENCOUNTER — Ambulatory Visit: Payer: Medicare Other | Admitting: Neurosurgery

## 2013-11-02 ENCOUNTER — Inpatient Hospital Stay (HOSPITAL_COMMUNITY): Admission: RE | Admit: 2013-11-02 | Payer: Medicare Other | Source: Ambulatory Visit

## 2013-11-02 ENCOUNTER — Ambulatory Visit: Payer: Medicare Other | Admitting: Family

## 2013-11-22 ENCOUNTER — Emergency Department (HOSPITAL_COMMUNITY): Payer: Medicare Other

## 2013-11-22 ENCOUNTER — Encounter (HOSPITAL_COMMUNITY): Payer: Self-pay | Admitting: Emergency Medicine

## 2013-11-22 ENCOUNTER — Inpatient Hospital Stay (HOSPITAL_COMMUNITY)
Admission: EM | Admit: 2013-11-22 | Discharge: 2013-11-30 | DRG: 190 | Disposition: A | Discharge status: Home / Self Care | Payer: Medicare Other | Attending: Family Medicine | Admitting: Family Medicine

## 2013-11-22 DIAGNOSIS — I498 Other specified cardiac arrhythmias: Secondary | ICD-10-CM

## 2013-11-22 DIAGNOSIS — J9600 Acute respiratory failure, unspecified whether with hypoxia or hypercapnia: Secondary | ICD-10-CM | POA: Diagnosis present

## 2013-11-22 DIAGNOSIS — M62838 Other muscle spasm: Secondary | ICD-10-CM | POA: Diagnosis present

## 2013-11-22 DIAGNOSIS — Z87891 Personal history of nicotine dependence: Secondary | ICD-10-CM

## 2013-11-22 DIAGNOSIS — R000 Tachycardia, unspecified: Secondary | ICD-10-CM | POA: Diagnosis present

## 2013-11-22 DIAGNOSIS — D72829 Elevated white blood cell count, unspecified: Secondary | ICD-10-CM | POA: Diagnosis present

## 2013-11-22 DIAGNOSIS — M1990 Unspecified osteoarthritis, unspecified site: Secondary | ICD-10-CM | POA: Diagnosis present

## 2013-11-22 DIAGNOSIS — I739 Peripheral vascular disease, unspecified: Secondary | ICD-10-CM | POA: Diagnosis present

## 2013-11-22 DIAGNOSIS — K219 Gastro-esophageal reflux disease without esophagitis: Secondary | ICD-10-CM | POA: Diagnosis present

## 2013-11-22 DIAGNOSIS — J441 Chronic obstructive pulmonary disease with (acute) exacerbation: Principal | ICD-10-CM

## 2013-11-22 DIAGNOSIS — I2510 Atherosclerotic heart disease of native coronary artery without angina pectoris: Secondary | ICD-10-CM | POA: Diagnosis present

## 2013-11-22 DIAGNOSIS — R0603 Acute respiratory distress: Secondary | ICD-10-CM

## 2013-11-22 DIAGNOSIS — R7309 Other abnormal glucose: Secondary | ICD-10-CM | POA: Diagnosis present

## 2013-11-22 DIAGNOSIS — E785 Hyperlipidemia, unspecified: Secondary | ICD-10-CM | POA: Diagnosis present

## 2013-11-22 DIAGNOSIS — I480 Paroxysmal atrial fibrillation: Secondary | ICD-10-CM | POA: Diagnosis present

## 2013-11-22 DIAGNOSIS — T380X5A Adverse effect of glucocorticoids and synthetic analogues, initial encounter: Secondary | ICD-10-CM | POA: Diagnosis present

## 2013-11-22 DIAGNOSIS — I4891 Unspecified atrial fibrillation: Secondary | ICD-10-CM

## 2013-11-22 DIAGNOSIS — Z7901 Long term (current) use of anticoagulants: Secondary | ICD-10-CM

## 2013-11-22 DIAGNOSIS — K625 Hemorrhage of anus and rectum: Secondary | ICD-10-CM

## 2013-11-22 DIAGNOSIS — J449 Chronic obstructive pulmonary disease, unspecified: Secondary | ICD-10-CM

## 2013-11-22 DIAGNOSIS — J705 Respiratory conditions due to smoke inhalation: Secondary | ICD-10-CM | POA: Diagnosis present

## 2013-11-22 LAB — HEPATIC FUNCTION PANEL
ALBUMIN: 3.6 g/dL (ref 3.5–5.2)
ALK PHOS: 49 U/L (ref 39–117)
ALT: 24 U/L (ref 0–53)
AST: 27 U/L (ref 0–37)
BILIRUBIN TOTAL: 0.4 mg/dL (ref 0.3–1.2)
Total Protein: 7.3 g/dL (ref 6.0–8.3)

## 2013-11-22 LAB — CBC WITH DIFFERENTIAL/PLATELET
Basophils Absolute: 0 10*3/uL (ref 0.0–0.1)
Basophils Relative: 0 % (ref 0–1)
Eosinophils Absolute: 0.1 10*3/uL (ref 0.0–0.7)
Eosinophils Relative: 2 % (ref 0–5)
HEMATOCRIT: 45.4 % (ref 39.0–52.0)
HEMOGLOBIN: 14.4 g/dL (ref 13.0–17.0)
Lymphocytes Relative: 12 % (ref 12–46)
Lymphs Abs: 1.1 10*3/uL (ref 0.7–4.0)
MCH: 28.4 pg (ref 26.0–34.0)
MCHC: 31.7 g/dL (ref 30.0–36.0)
MCV: 89.5 fL (ref 78.0–100.0)
MONO ABS: 1.3 10*3/uL — AB (ref 0.1–1.0)
MONOS PCT: 14 % — AB (ref 3–12)
Neutro Abs: 6.9 10*3/uL (ref 1.7–7.7)
Neutrophils Relative %: 73 % (ref 43–77)
Platelets: 208 10*3/uL (ref 150–400)
RBC: 5.07 MIL/uL (ref 4.22–5.81)
RDW: 13.8 % (ref 11.5–15.5)
WBC: 9.4 10*3/uL (ref 4.0–10.5)

## 2013-11-22 LAB — BASIC METABOLIC PANEL
BUN: 11 mg/dL (ref 6–23)
CO2: 28 meq/L (ref 19–32)
CREATININE: 0.75 mg/dL (ref 0.50–1.35)
Calcium: 9.6 mg/dL (ref 8.4–10.5)
Chloride: 102 mEq/L (ref 96–112)
GFR calc Af Amer: >90 mL/min (ref >90)
GFR calc non Af Amer: >90 mL/min (ref >90)
Glucose, Bld: 111 mg/dL — ABNORMAL HIGH (ref 70–99)
Potassium: 4 mEq/L (ref 3.7–5.3)
Sodium: 143 mEq/L (ref 137–147)

## 2013-11-22 LAB — APTT: aPTT: 30 seconds (ref 24–37)

## 2013-11-22 LAB — LIPID PANEL
Cholesterol: 157 mg/dL (ref 0–200)
HDL: 54 mg/dL (ref >39)
LDL CALC: 81 mg/dL (ref 0–99)
Total CHOL/HDL Ratio: 2.9 RATIO
Triglycerides: 110 mg/dL (ref <150)
VLDL: 22 mg/dL (ref 0–40)

## 2013-11-22 LAB — BLOOD GAS, ARTERIAL
ACID-BASE EXCESS: 1.5 mmol/L (ref 0.0–2.0)
Bicarbonate: 25.1 mEq/L — ABNORMAL HIGH (ref 20.0–24.0)
DRAWN BY: 38235
FIO2: 0.36 %
O2 Saturation: 98.6 %
PATIENT TEMPERATURE: 37
PCO2 ART: 36.4 mmHg (ref 35.0–45.0)
TCO2: 21.6 mmol/L (ref 0–100)
pH, Arterial: 7.453 — ABNORMAL HIGH (ref 7.350–7.450)
pO2, Arterial: 122 mmHg — ABNORMAL HIGH (ref 80.0–100.0)

## 2013-11-22 LAB — TROPONIN I: Troponin I: <0.3 ng/mL (ref <0.30)

## 2013-11-22 LAB — TSH: TSH: 1.34 u[IU]/mL (ref 0.350–4.500)

## 2013-11-22 LAB — MRSA PCR SCREENING: MRSA by PCR: NEGATIVE

## 2013-11-22 LAB — PROTIME-INR
INR: 1.28 (ref 0.00–1.49)
PROTHROMBIN TIME: 15.7 s — AB (ref 11.6–15.2)

## 2013-11-22 MED ORDER — ALBUTEROL SULFATE (2.5 MG/3ML) 0.083% IN NEBU
2.5000 mg | INHALATION_SOLUTION | RESPIRATORY_TRACT | Status: DC
  Administered 2013-11-22: 2.5 mg via RESPIRATORY_TRACT
  Filled 2013-11-22: qty 3

## 2013-11-22 MED ORDER — ONDANSETRON HCL 4 MG/2ML IJ SOLN: 4.0000 mg | Freq: Three times a day (TID) | INTRAMUSCULAR | Status: DC | PRN

## 2013-11-22 MED ORDER — ONDANSETRON HCL 4 MG/2ML IJ SOLN: 4.0000 mg | Freq: Four times a day (QID) | INTRAMUSCULAR | Status: DC | PRN

## 2013-11-22 MED ORDER — ONDANSETRON HCL 4 MG/2ML IJ SOLN
4.0000 mg | Freq: Once | INTRAMUSCULAR | Status: AC
  Administered 2013-11-22: 4 mg via INTRAVENOUS
  Filled 2013-11-22: qty 2

## 2013-11-22 MED ORDER — LEVALBUTEROL HCL 1.25 MG/0.5ML IN NEBU
10.0000 mg | INHALATION_SOLUTION | RESPIRATORY_TRACT | Status: DC
Start: 2013-11-22 — End: 2013-11-22
  Administered 2013-11-22: 10 mg via RESPIRATORY_TRACT

## 2013-11-22 MED ORDER — SODIUM CHLORIDE 0.9 % IJ SOLN
3.0000 mL | INTRAMUSCULAR | Status: DC | PRN
  Administered 2013-11-24: 10 mL via INTRAVENOUS

## 2013-11-22 MED ORDER — ONDANSETRON HCL 4 MG PO TABS: 4.0000 mg | ORAL_TABLET | Freq: Four times a day (QID) | ORAL | Status: DC | PRN

## 2013-11-22 MED ORDER — SENNA 8.6 MG PO TABS
1.0000 | ORAL_TABLET | Freq: Two times a day (BID) | ORAL | Status: DC
  Administered 2013-11-22 – 2013-11-30 (×17): 8.6 mg via ORAL
  Filled 2013-11-22 (×17): qty 1

## 2013-11-22 MED ORDER — LEVALBUTEROL HCL 0.63 MG/3ML IN NEBU
0.6300 mg | INHALATION_SOLUTION | RESPIRATORY_TRACT | Status: DC
  Administered 2013-11-22 – 2013-11-30 (×46): 0.63 mg via RESPIRATORY_TRACT
  Filled 2013-11-22 (×47): qty 3

## 2013-11-22 MED ORDER — SODIUM CHLORIDE 0.9 % IJ SOLN
3.0000 mL | Freq: Two times a day (BID) | INTRAMUSCULAR | Status: DC
  Administered 2013-11-22 – 2013-11-30 (×15): 3 mL via INTRAVENOUS

## 2013-11-22 MED ORDER — LEVALBUTEROL HCL 0.63 MG/3ML IN NEBU: 0.6300 mg | INHALATION_SOLUTION | RESPIRATORY_TRACT | Status: DC | PRN

## 2013-11-22 MED ORDER — IPRATROPIUM BROMIDE 0.02 % IN SOLN
0.5000 mg | Freq: Once | RESPIRATORY_TRACT | Status: AC
  Administered 2013-11-22: 0.5 mg via RESPIRATORY_TRACT
  Filled 2013-11-22: qty 2.5

## 2013-11-22 MED ORDER — DILTIAZEM HCL 25 MG/5ML IV SOLN
10.0000 mg | Freq: Once | INTRAVENOUS | Status: AC
  Administered 2013-11-22: 10 mg via INTRAVENOUS

## 2013-11-22 MED ORDER — ALPRAZOLAM 0.5 MG PO TABS
0.5000 mg | ORAL_TABLET | Freq: Three times a day (TID) | ORAL | Status: DC | PRN
  Administered 2013-11-23 – 2013-11-30 (×13): 0.5 mg via ORAL
  Filled 2013-11-22 (×13): qty 1

## 2013-11-22 MED ORDER — ATORVASTATIN CALCIUM 20 MG PO TABS
20.0000 mg | ORAL_TABLET | Freq: Every day | ORAL | Status: DC
  Administered 2013-11-22 – 2013-11-29 (×8): 20 mg via ORAL
  Filled 2013-11-22 (×8): qty 1

## 2013-11-22 MED ORDER — ACETAMINOPHEN 650 MG RE SUPP: 650.0000 mg | Freq: Four times a day (QID) | RECTAL | Status: DC | PRN

## 2013-11-22 MED ORDER — ACETAMINOPHEN 325 MG PO TABS: 650.0000 mg | ORAL_TABLET | Freq: Four times a day (QID) | ORAL | Status: DC | PRN

## 2013-11-22 MED ORDER — HYDROCODONE-ACETAMINOPHEN 5-325 MG PO TABS
1.0000 | ORAL_TABLET | Freq: Two times a day (BID) | ORAL | Status: DC | PRN
  Administered 2013-11-24: 1 via ORAL
  Filled 2013-11-22: qty 1

## 2013-11-22 MED ORDER — DILTIAZEM HCL 25 MG/5ML IV SOLN
INTRAVENOUS | Status: AC
  Administered 2013-11-22: 08:00:00 10 mg via INTRAVENOUS
  Filled 2013-11-22: qty 5

## 2013-11-22 MED ORDER — NITROGLYCERIN 0.4 MG SL SUBL: 0.4000 mg | SUBLINGUAL_TABLET | SUBLINGUAL | Status: DC | PRN

## 2013-11-22 MED ORDER — BISACODYL 10 MG RE SUPP: 10.0000 mg | Freq: Every day | RECTAL | Status: DC | PRN

## 2013-11-22 MED ORDER — LEVALBUTEROL HCL 0.63 MG/3ML IN NEBU
0.6300 mg | INHALATION_SOLUTION | Freq: Four times a day (QID) | RESPIRATORY_TRACT | Status: DC
  Administered 2013-11-22 (×2): 0.63 mg via RESPIRATORY_TRACT
  Filled 2013-11-22 (×2): qty 3

## 2013-11-22 MED ORDER — LEVALBUTEROL HCL 1.25 MG/0.5ML IN NEBU
INHALATION_SOLUTION | RESPIRATORY_TRACT | Status: AC
  Filled 2013-11-22: qty 4

## 2013-11-22 MED ORDER — APIXABAN 5 MG PO TABS
5.0000 mg | ORAL_TABLET | Freq: Two times a day (BID) | ORAL | Status: DC
  Administered 2013-11-22 – 2013-11-30 (×17): 5 mg via ORAL
  Filled 2013-11-22 (×20): qty 1

## 2013-11-22 MED ORDER — METHYLPREDNISOLONE SODIUM SUCC 125 MG IJ SOLR
60.0000 mg | Freq: Four times a day (QID) | INTRAMUSCULAR | Status: DC
  Administered 2013-11-22 – 2013-11-23 (×4): 60 mg via INTRAVENOUS
  Filled 2013-11-22 (×4): qty 2

## 2013-11-22 MED ORDER — ALUM & MAG HYDROXIDE-SIMETH 200-200-20 MG/5ML PO SUSP
30.0000 mL | Freq: Four times a day (QID) | ORAL | Status: DC | PRN
  Administered 2013-11-24 – 2013-11-25 (×2): 30 mL via ORAL
  Filled 2013-11-22 (×2): qty 30

## 2013-11-22 MED ORDER — SODIUM CHLORIDE 0.9 % IV SOLN
250.0000 mL | INTRAVENOUS | Status: DC | PRN
  Administered 2013-11-22: 250 mL via INTRAVENOUS

## 2013-11-22 MED ORDER — ALBUTEROL (5 MG/ML) CONTINUOUS INHALATION SOLN: 10.0000 mg | INHALATION_SOLUTION | RESPIRATORY_TRACT | Status: DC

## 2013-11-22 MED ORDER — PANTOPRAZOLE SODIUM 40 MG PO TBEC
40.0000 mg | DELAYED_RELEASE_TABLET | Freq: Every day | ORAL | Status: DC
  Administered 2013-11-22 – 2013-11-30 (×9): 40 mg via ORAL
  Filled 2013-11-22 (×8): qty 1

## 2013-11-22 MED ORDER — TIZANIDINE HCL 4 MG PO TABS
4.0000 mg | ORAL_TABLET | Freq: Two times a day (BID) | ORAL | Status: DC | PRN
  Filled 2013-11-22: qty 1

## 2013-11-22 MED ORDER — LEVOFLOXACIN IN D5W 750 MG/150ML IV SOLN
750.0000 mg | INTRAVENOUS | Status: DC
  Administered 2013-11-22 – 2013-11-28 (×7): 750 mg via INTRAVENOUS
  Filled 2013-11-22 (×8): qty 150

## 2013-11-22 MED ORDER — GUAIFENESIN ER 600 MG PO TB12
600.0000 mg | ORAL_TABLET | Freq: Two times a day (BID) | ORAL | Status: DC
  Administered 2013-11-22 (×2): 600 mg via ORAL
  Filled 2013-11-22 (×3): qty 1

## 2013-11-22 MED ORDER — DILTIAZEM HCL 30 MG PO TABS
30.0000 mg | ORAL_TABLET | Freq: Four times a day (QID) | ORAL | Status: DC
  Administered 2013-11-22 – 2013-11-23 (×4): 30 mg via ORAL
  Filled 2013-11-22 (×4): qty 1

## 2013-11-22 NOTE — H&P (Signed)
[facility] History and Physical  Kenneth Merritt KGM:[id number] DOB: [birth year] DOA: 11/22/2013  Referring physician:  PCP: Neale Burly, MD   Chief Complaint: Shortness of breath  HPI: Kenneth Merritt is a 63 y.o. male with a past medical history that includes COPD not on home oxygen, CAD per catheterization, A. Fib, aortobifemoral bypass surgery 2011 cassettes in the emergency department with the chief complaint of shortness of breath. Information is obtained from the patient. He indicated that he developed worsening shortness of breath about 3 days ago. He is not on home oxygen but he does have home nebulizers. He reports that about a week ago he was working outdoors near a Estate [employer] and he "could not get away from the smoke". Associated symptoms include productive cough with thick white sputum and intermittent chest pain particularly with coughing. He reports the pain is similar to "muscle spasm" that he also suffers from. The pain is located bilateral anterior chest without any radiation. No palpitations diaphoresis nausea or vomiting. He denies any fever chills or recent sick contacts. He denies any lower extremity edema but reports that he's been sleeping propped up on 2 pillows for several years now. He reports that he took several nebulizer treatments at home without relief. Workup in the emergency room includes an arterial blood gas with a pH of 7.45 PCO2 36.4 PO2 122 bicarbonate 25. CBC and basic metabolic panel are unremarkable. Troponin is negative. Chest x-ray yields COPD without superimposed cardiopulmonary process and EKG yields sinus tachycardia at a rate of 151. His respiratory rate was elevated and his oxygen saturation level remained above 90%. He was given continuous nebulizer therapy with Xopenex and offered BiPAP but he refused. He was given prednisone and Levaquin was started. In addition Cardizem 10 mg intravenously was administered. At the time of my exam his heart rate was 120  respiratory rate was 18. Are asked to admit    Review of Systems:  10 point review of systems completed and all systems are negative except as indicated in the history of present illness  Past Medical History  Diagnosis Date  . GERD (gastroesophageal reflux disease)   . COPD (chronic obstructive pulmonary disease)     severe by PFTs August 2010  . 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 Atrial fibrillation     post-op a-fib. 3/11. post Aortobifem , /  Atrial fibrillation from nebulizer treatment  May, 2012, while hospitalized  . Renal artery stenosis      presumably corrected at time of aortobifem surgery March, 2011  . Tobacco user      stop smoking  . Pneumothorax      2011  before aortobifemoral surgery,  resolved  . Peripheral vascular disease, unspecified     bilateral intermittent claudication  . CAD (coronary artery disease), native coronary artery     catheterization 06/2009.Kenneth KitchenMarland Kitchen70% proximal LAD and 30% proximal RCA. normal LV function, no intervention planned prior to surgery for aorta. LV normal function  by catheter 8/10.   Kenneth Merritt Dyslipidemia   . DJD (degenerative joint disease)   . Ejection fraction     60%, echo, May, 2012, rapid atrial fib at the time  . Cancer 2013    Prostate Radiation   Past Surgical History  Procedure Laterality Date  . Abdominal aortic aneurysm repair  01/11/2010  . Inguinal hernia repair  RIGHT  . Pr vein bypass graft,aorto-fem-pop     Social History:  reports that he quit smoking about 3 years ago. His smoking use included Cigarettes. He has a 80 pack-year smoking history. He has never used smokeless tobacco. He reports that he does not drink alcohol. His drug history is not on file.  No Known Allergies He lives with his wife and is independent with ADLs Family History  Problem Relation Age of Onset  . Cancer Father     prostate     Prior to  Admission medications   Medication Sig Start Date End Date Taking? Authorizing Provider  albuterol (PROVENTIL) (2.5 MG/3ML) 0.083% nebulizer solution Take 2.5 mg by nebulization 3 (three) times daily as needed.   Yes Historical Provider, MD  albuterol (VENTOLIN HFA) 108 (90 BASE) MCG/ACT inhaler Inhale 2 puffs into the lungs every 6 (six) hours as needed.     Yes Historical Provider, MD  ALPRAZolam Prudy Feeler) 0.5 MG tablet Take 0.5 mg by mouth 3 (three) times daily as needed.    Yes Historical Provider, MD  apixaban (ELIQUIS) 5 MG TABS tablet TAKE 1 TABLET TWICE A DAY 10/31/13  Yes Luis Abed, MD  metoprolol (LOPRESSOR) 50 MG tablet Take 50 mg by mouth 2 (two) times daily.   Yes Historical Provider, MD  nitroGLYCERIN (NITROSTAT) 0.4 MG SL tablet Place 1 tablet (0.4 mg total) under the tongue every 5 (five) minutes as needed. 09/20/12  Yes Luis Abed, MD  omeprazole (PRILOSEC) 20 MG capsule Take 1 capsule by mouth Daily. 04/21/11  Yes Historical Provider, MD  predniSONE (DELTASONE) 10 MG tablet Take 10 mg by mouth daily with breakfast.   Yes Historical Provider, MD  simvastatin (ZOCOR) 40 MG tablet TAKE 1 TABLET BY MOUTH AT BEDTIME. 07/31/13  Yes Luis Abed, MD  HYDROcodone-acetaminophen (VICODIN) 5-500 MG per tablet Take 1 tablet by mouth Twice daily as needed. 09/08/12   Historical Provider, MD  promethazine (PHENERGAN) 25 MG tablet Take 1 tablet (25 mg total) by mouth every 6 (six) hours as needed for nausea. 03/03/12 03/10/12  Benny Lennert, MD  tiZANidine (ZANAFLEX) 4 MG tablet Take 1 tablet by mouth Twice daily as needed. 08/12/12   Historical Provider, MD   Physical Exam: Filed Vitals:   11/22/13 0800  BP: 99/71  Pulse: 140  Temp:   Resp: 18    BP 99/71  Pulse 140  Temp(Src) 97.8 F (36.6 C) (Oral)  Resp 18  Ht 5\' 4"  (1.626 m)  Wt 62.9 kg (138 lb 10.7 oz)  BMI 23.79 kg/m2  SpO2 99%  General:  Appears calm and comfortable, eating a little older than his stated years of  40 Eyes: PERRL, normal lids, irises & conjunctiva ENT: grossly normal hearing, lips & tongue. His membranes of his mouth are pink slightly dry Neck: no LAD, masses or thyromegaly, full range of motion Cardiovascular: Tachycardic but regular, no m/r/g. No LE edema. Respiratory:  Mild increased work of breathing with conversation. Rest sounds are very diminished throughout with faint expiratory wheeze particularly on the left. Abdomen: soft, nondistended. His abdomen is nontender to palpation. No mass organomegaly noted Skin: No rash or lesion Musculoskeletal: grossly normal tone BUE/BLE. Joints are without erythema or swelling Psychiatric: grossly normal mood and affect, speech fluent and appropriate Neurologic: grossly non-focal. Renal nerves II through XII grossly intact           Labs on Admission:  Basic Metabolic Panel:  Recent Labs Lab 11/22/13  0444  NA 143  K 4.0  CL 102  CO2 28  GLUCOSE 111*  BUN 11  CREATININE 0.75  CALCIUM 9.6   Liver Function Tests: No results found for this basename: AST, ALT, ALKPHOS, BILITOT, PROT, ALBUMIN,  in the last 168 hours No results found for this basename: LIPASE, AMYLASE,  in the last 168 hours No results found for this basename: AMMONIA,  in the last 168 hours CBC:  Recent Labs Lab 11/22/13 0444  WBC 9.4  NEUTROABS 6.9  HGB 14.4  HCT 45.4  MCV 89.5  PLT 208   Cardiac Enzymes:  Recent Labs Lab 11/22/13 0444  TROPONINI <0.30    BNP (last 3 results) No results found for this basename: PROBNP,  in the last 8760 hours CBG: No results found for this basename: GLUCAP,  in the last 168 hours  Radiological Exams on Admission: Dg Chest Port 1 View  11/22/2013   CLINICAL DATA:  Shortness of breath, history of COPD.  EXAM: PORTABLE CHEST - 1 VIEW  COMPARISON:  Chest radiograph October 19, 2013  FINDINGS: Cardiomediastinal silhouette is unremarkable unchanged. Increased lung volumes, diffuse mild chronic interstitial changes  with biapical pleural parenchymal scarring. No pleural effusions though, the costophrenic angles are incompletely imaged. No pneumothorax.  Multiple EKG lines overlie the patient and may obscure subtle underlying pathology. Soft tissue planes and included osseous structures are nonsuspicious.  IMPRESSION: COPD without superimposed acute cardiopulmonary process.   Electronically Signed   By: Elon Alas   On: 11/22/2013 05:09    EKG: Independently reviewed yields sinus tachycardia at a rate of 151  Assessment/Plan Principal Problem:   Respiratory distress: Related to COPD exacerbation secondary to smoke exposure. Much improved at the time of my exam. Will to complete sentences and saturation level 93% on 2 L. ABG as above. Will admit to step down unit for close monitoring. Will add Solu-Medrol, Levaquin, Xopenex nebulizers. Will wean oxygen as indicated.  Active Problems: COPD exacerbation: Likely related to exposure outdoor smoke last week. It is not on home oxygen but does have home nebulizers. Will continue Xopenex nebs, Solu-Medrol, Levaquin. Monitor oxygen saturation level. Wean oxygen as indicated    Sinus tachycardia: Most likely related to nebulizer treatments. Will change to Xopenex. Medications include metoprolol 50 mg twice a day. Will hold this for now secondary to #2. Provide Cardizem. Chart review indicates patient on Cardizem in the past but this was changed last year per cardiology. Time of my exam heart rate trending down. Will monitor. If on improvement consider cardiology consult.   Long term (current) use of of anticoagulants: hx of afib primarily when in hospital  And a stress situation per cardiac note. On a lEliquis. Will continue for now.    Chest pain: Is likely musculoskeletal from coughing in the setting of  history of muscle spasms as well. Given his history will go ahead and cycle his troponins. Her review indicates catheterization in 2010 with 70% proximal LAD and  30% proximal RCA. Overall LV function.    BRBPR (bright red blood per rectum): Patient reports 2 episodes yesterday. Hemoglobin is stable. Patient is on a Eliquis will continue this for now. Change in his hemoglobin or further episodes we'll consider GI consult. I need outpatient followup otherwise   CAD (coronary artery disease), native coronary artery: Presentation 2010 with 70% proximal LAD and 30% proximal RCA. Her mid chest pain as noted above. Continue statin. Obtain lipid panel.       GERD (  gastroesophageal reflux disease) stable at baseline will continue home PPI   Code Status: full Family Communication: none present Disposition Plan: home when ready  Time spent: 4 minututes  [facility] Pager [phone]

## 2013-11-22 NOTE — ED Notes (Signed)
Per EMS: pt c/o SOB; gave himself 3 breathing treatments prior to EMS arrival. EMS gave 125 Solu-medrol and placed pt on C-pap

## 2013-11-22 NOTE — ED Notes (Addendum)
EDP notified of continued elevation of HR. Per verbal instruction, stopped the nebulizer treatment and placed pt on O2 via nasal cannula.

## 2013-11-22 NOTE — H&P (Signed)
The patient was seen and examined. He was discussed with nurse practitioner, Ms. Renard Hamper. Agree with her assessment and plan. This is a 63 year old with COPD, not on chronic oxygen who presents with acute COPD exacerbation. His ABG on admission is reassuring. He has peripheral vascular disease as well as coronary artery disease, however, he has no complaints of chest pain. He is on Eliquis presumably for his history of paroxysmal atrial fibrillation. He has tachycardia, but it is clearly sinus tachycardia. Metoprolol will need to be withheld because of his bronchospasms. Agree that his tachycardia and blood pressure will need to be treated with diltiazem until his bronchospasms subside or resolve. He reported bright red blood per to him on anticoagulation. His hemoglobin is stable. If you become symptomatic with rectal bleeding, we will temporarily hold anticoagulation and consult gastroenterology. Continue PPI for now. TSH and cardiac enzymes are pending.

## 2013-11-22 NOTE — ED Notes (Signed)
Respiratory therapist attempted to place pt on Bipap. Pt became anxious, pulling on mask stating he could not breathe as he is claustrophobic. EDP made aware.

## 2013-11-22 NOTE — ED Provider Notes (Addendum)
CSN: [id number]     Arrival date & time 11/22/13  0432 History   First MD Initiated Contact with Patient 11/22/13 0434     Chief Complaint  Patient presents with  . Shortness of Breath   (Consider location/radiation/quality/duration/timing/severity/associated sxs/prior Treatment) HPI Comments: 63 year old male, presents to the hospital with severe SOB that he states started 3 days ago, has been persistent, gradually worsening and is now severe and associated with white phlegm production - denies fevers or chills or swelling, he is on chronic prednisone therapy at 10mg  / day.  EMS found the pt to be in severe resp distress with tight wheeze on exam and sat's of upper 98%.  He had just given himself 3 albuterol nebs over the evening without improvement.  He received 125 of solumedrol and Bipap which he states helped enroute.  Patient is a 63 y.o. male presenting with shortness of breath. The history is provided by the patient, the EMS personnel and medical records.  Shortness of Breath   Past Medical History  Diagnosis Date  . GERD (gastroesophageal reflux disease)   . COPD (chronic obstructive pulmonary disease)     severe by PFTs August 2010  . 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 Atrial fibrillation     post-op a-fib. 3/11. post Aortobifem , /  Atrial fibrillation from nebulizer treatment  May, 2012, while hospitalized  . Renal artery stenosis      presumably corrected at time of aortobifem surgery March, 2011  . Tobacco user      stop smoking  . Pneumothorax      2011  before aortobifemoral surgery,  resolved  . Peripheral vascular disease, unspecified     bilateral intermittent claudication  . CAD (coronary artery disease), native coronary artery     catheterization 06/2009.Marland KitchenMarland Kitchen70% proximal LAD and 30% proximal RCA. normal LV function, no intervention planned prior to surgery for aorta. LV  normal function  by catheter 8/10.   [name] Dyslipidemia   . DJD (degenerative joint disease)   . Ejection fraction     60%, echo, May, 2012, rapid atrial fib at the time  . Cancer 2013    Prostate Radiation   Past Surgical History  Procedure Laterality Date  . Abdominal aortic aneurysm repair  01/11/2010  . Inguinal hernia repair      RIGHT  . Pr vein bypass graft,aorto-fem-pop     Family History  Problem Relation Age of Onset  . Cancer Father     prostate   History  Substance Use Topics  . Smoking status: Former Smoker -- 2.00 packs/day for 40 years    Types: Cigarettes    Quit date: 01/11/2010  . Smokeless tobacco: Never Used  . Alcohol Use: No    Review of Systems  Respiratory: Positive for shortness of breath.   All other systems reviewed and are negative.    Allergies  Review of patient's allergies indicates no known allergies.  Home Medications   Current Outpatient Rx  Name  Route  Sig  Dispense  Refill  . albuterol (PROVENTIL) (2.5 MG/3ML) 0.083% nebulizer solution   Nebulization   Take 2.5 mg by nebulization 3 (three) times daily as needed.         [name] albuterol (VENTOLIN HFA) 108 (90 BASE) MCG/ACT inhaler   Inhalation   Inhale 2 puffs into the lungs every 6 (six)  hours as needed.           . ALPRAZolam (XANAX) 0.5 MG tablet   Oral   Take 0.5 mg by mouth 3 (three) times daily as needed.          [name] apixaban (ELIQUIS) 5 MG TABS tablet      TAKE 1 TABLET TWICE A DAY   60 tablet   0     Must call office at [phone] to schedule an ap ...   . metoprolol (LOPRESSOR) 50 MG tablet   Oral   Take 50 mg by mouth 2 (two) times daily.         . nitroGLYCERIN (NITROSTAT) 0.4 MG SL tablet   Sublingual   Place 1 tablet (0.4 mg total) under the tongue every 5 (five) minutes as needed.   25 tablet   3   . omeprazole (PRILOSEC) 20 MG capsule   Oral   Take 1 capsule by mouth Daily.         . predniSONE (DELTASONE) 10 MG tablet   Oral   Take 10  mg by mouth daily with breakfast.         . simvastatin (ZOCOR) 40 MG tablet      TAKE 1 TABLET BY MOUTH AT BEDTIME.   30 tablet   3     .Marland KitchenPatient needs to contact office to schedule  App ...   . HYDROcodone-acetaminophen (VICODIN) 5-500 MG per tablet   Oral   Take 1 tablet by mouth Twice daily as needed.         [name] EXPIRED: promethazine (PHENERGAN) 25 MG tablet   Oral   Take 1 tablet (25 mg total) by mouth every 6 (six) hours as needed for nausea.   15 tablet   0   . tiZANidine (ZANAFLEX) 4 MG tablet   Oral   Take 1 tablet by mouth Twice daily as needed.          BP 109/80  Pulse 168  Temp(Src) 99.8 F (37.7 C) (Oral)  Resp 24  Ht 5\' 4"  (1.626 m)  Wt 140 lb (63.504 kg)  BMI 24.02 kg/m2  SpO2 94% Physical Exam  Nursing note and vitals reviewed. Constitutional: He appears well-developed and well-nourished. He appears distressed.  HENT:  Head: Normocephalic and atraumatic.  Mouth/Throat: Oropharynx is clear and moist. No oropharyngeal exudate.  Eyes: Conjunctivae and EOM are normal. Pupils are equal, round, and reactive to light. Right eye exhibits no discharge. Left eye exhibits no discharge. No scleral icterus.  Neck: Normal range of motion. Neck supple. No JVD present. No thyromegaly present.  Cardiovascular: Normal heart sounds and intact distal pulses.  Exam reveals no gallop and no friction rub.   No murmur heard. Tachy to 150  Pulmonary/Chest: He is in respiratory distress. He has wheezes. He has no rales.  Abdominal: Soft. Bowel sounds are normal. He exhibits no distension and no mass. There is no tenderness.  Musculoskeletal: Normal range of motion. He exhibits no edema and no tenderness.  Lymphadenopathy:    He has no cervical adenopathy.  Neurological: He is alert. Coordination normal.  Skin: Skin is warm and dry. No rash noted. No erythema.  Psychiatric: He has a normal mood and affect. His behavior is normal.    ED Course  Procedures (including  critical care time) Labs Review Labs Reviewed  CBC WITH DIFFERENTIAL - Abnormal; Notable for the following:    Monocytes Relative 14 (*)    Monocytes Absolute 1.3 (*)  All other components within normal limits  BASIC METABOLIC PANEL - Abnormal; Notable for the following:    Glucose, Bld 111 (*)    All other components within normal limits  PROTIME-INR - Abnormal; Notable for the following:    Prothrombin Time 15.7 (*)    All other components within normal limits  BLOOD GAS, ARTERIAL - Abnormal; Notable for the following:    pH, Arterial 7.453 (*)    pO2, Arterial 122.0 (*)    Bicarbonate 25.1 (*)    All other components within normal limits  TROPONIN I  APTT   Imaging Review Dg Chest Port 1 View  11/22/2013   CLINICAL DATA:  Shortness of breath, history of COPD.  EXAM: PORTABLE CHEST - 1 VIEW  COMPARISON:  Chest radiograph October 19, 2013  FINDINGS: Cardiomediastinal silhouette is unremarkable unchanged. Increased lung volumes, diffuse mild chronic interstitial changes with biapical pleural parenchymal scarring. No pleural effusions though, the costophrenic angles are incompletely imaged. No pneumothorax.  Multiple EKG lines overlie the patient and may obscure subtle underlying pathology. Soft tissue planes and included osseous structures are nonsuspicious.  IMPRESSION: COPD without superimposed acute cardiopulmonary process.   Electronically Signed   By: Elon Alas   On: 11/22/2013 05:09    EKG Interpretation    Date/Time:  Tuesday November 22 2013 04:39:07 EST Ventricular Rate:  151 PR Interval:  118 QRS Duration: 72 QT Interval:  342 QTC Calculation: 542 R Axis:   -66 Text Interpretation:  Sinus tachycardia Left axis deviation Pulmonary disease pattern Septal infarct , age undetermined Abnormal ECG Since last tracing rate faster Confirmed by Merritt Kibby  MD, Bexton Haak ([id number]) on 11/22/2013 5:04:02 AM            MDM   1. Respiratory distress   2. COPD (chronic  obstructive pulmonary disease)    The patient is in severe respiratory distress, he is very tight and wheezing in all lung fields, has diffuse expiratory prolonged phase, diffuse expiratory wheezing, speaks in one to 2 word sentences. He does not have any peripheral edema and given his underlying history of COPD I suspect that what we are seeing here is COPD exacerbation with possible underlying infection. He will receive a chest x-ray, labs, continuous nebulizer therapy with Xopenex to try to minimize the tachycardic affect. Steroids given prior to arrival.  Mr Lichtenstein has stayed severely tachycardic since arrival - xopenex 10mg  with increase in HR to 160's, lung sounds slightly improved and now oxygenating at 97% - he is still wheezing in all lung fields, but able to speak in 3 + word sentences.  He refuses Bipap due to the suffocating feeling - ABG pending, will admit.  Discussed with the hospitalist, will admit to step down. ABG shows a rather normal acid-base level, CO2 of 36 and a PO2 of 122. He has improved significantly though his heart rate is severely tachycardic which I suspect is related to bronchodilator stimulatory effect.  CRITICAL CARE Performed by: Johnna Acosta Total critical care time: 35 Critical care time was exclusive of separately billable procedures and treating other patients. Critical care was necessary to treat or prevent imminent or life-threatening deterioration. Critical care was time spent personally by me on the following activities: development of treatment plan with patient and/or surrogate as well as nursing, discussions with consultants, evaluation of patient's response to treatment, examination of patient, obtaining history from patient or surrogate, ordering and performing treatments and interventions, ordering and review of laboratory studies, ordering and review of  radiographic studies, pulse oximetry and re-evaluation of patient's condition.   Johnna Acosta,  MD 11/22/13 [id number]  Johnna Acosta, MD 11/22/13 [phone]

## 2013-11-22 NOTE — Progress Notes (Signed)
RT attempted to place patient on BIPAP per MD order due to patients increased work of breathing.  Patient left BIPAP mask on for all of 2 minutes before he took off and stated that he was unable to wear the mask. He stated that he felt like he was breathing better than earlier.  Dr. Sabra Heck notified and ABG drawn. BIPAP left on standby at bedside.

## 2013-11-23 DIAGNOSIS — R0609 Other forms of dyspnea: Secondary | ICD-10-CM

## 2013-11-23 DIAGNOSIS — R0989 Other specified symptoms and signs involving the circulatory and respiratory systems: Secondary | ICD-10-CM

## 2013-11-23 LAB — CBC
HCT: 42.6 % (ref 39.0–52.0)
Hemoglobin: 14.4 g/dL (ref 13.0–17.0)
MCH: 30.7 pg (ref 26.0–34.0)
MCHC: 33.8 g/dL (ref 30.0–36.0)
MCV: 90.8 fL (ref 78.0–100.0)
PLATELETS: 211 10*3/uL (ref 150–400)
RBC: 4.69 MIL/uL (ref 4.22–5.81)
RDW: 13.9 % (ref 11.5–15.5)
WBC: 13.7 10*3/uL — AB (ref 4.0–10.5)

## 2013-11-23 LAB — BASIC METABOLIC PANEL
BUN: 14 mg/dL (ref 6–23)
CO2: 27 meq/L (ref 19–32)
Calcium: 9.5 mg/dL (ref 8.4–10.5)
Chloride: 103 mEq/L (ref 96–112)
Creatinine, Ser: 0.71 mg/dL (ref 0.50–1.35)
GFR calc Af Amer: >90 mL/min (ref >90)
GFR calc non Af Amer: >90 mL/min (ref >90)
Glucose, Bld: 158 mg/dL — ABNORMAL HIGH (ref 70–99)
POTASSIUM: 4.9 meq/L (ref 3.7–5.3)
SODIUM: 143 meq/L (ref 137–147)

## 2013-11-23 MED ORDER — GUAIFENESIN ER 600 MG PO TB12
1200.0000 mg | ORAL_TABLET | Freq: Two times a day (BID) | ORAL | Status: DC
  Administered 2013-11-23 – 2013-11-24 (×3): 1200 mg via ORAL
  Filled 2013-11-23 (×3): qty 2

## 2013-11-23 MED ORDER — DILTIAZEM HCL 60 MG PO TABS
60.0000 mg | ORAL_TABLET | Freq: Four times a day (QID) | ORAL | Status: DC
  Administered 2013-11-23 – 2013-11-25 (×8): 60 mg via ORAL
  Filled 2013-11-23 (×8): qty 1

## 2013-11-23 MED ORDER — LEVALBUTEROL HCL 0.63 MG/3ML IN NEBU
0.6300 mg | INHALATION_SOLUTION | RESPIRATORY_TRACT | Status: DC | PRN
  Administered 2013-11-23 – 2013-11-26 (×4): 0.63 mg via RESPIRATORY_TRACT
  Filled 2013-11-23 (×4): qty 3

## 2013-11-23 MED ORDER — BENZONATATE 100 MG PO CAPS
100.0000 mg | ORAL_CAPSULE | Freq: Three times a day (TID) | ORAL | Status: DC
  Filled 2013-11-23: qty 1

## 2013-11-23 MED ORDER — HYDROCODONE-HOMATROPINE 5-1.5 MG/5ML PO SYRP
5.0000 mL | ORAL_SOLUTION | Freq: Four times a day (QID) | ORAL | Status: DC | PRN
  Administered 2013-11-23 – 2013-11-25 (×4): 5 mL via ORAL
  Filled 2013-11-23 (×4): qty 5

## 2013-11-23 MED ORDER — IPRATROPIUM BROMIDE 0.02 % IN SOLN
0.5000 mg | RESPIRATORY_TRACT | Status: DC
  Administered 2013-11-23 – 2013-11-30 (×43): 0.5 mg via RESPIRATORY_TRACT
  Filled 2013-11-23 (×44): qty 2.5

## 2013-11-23 MED ORDER — DILTIAZEM HCL 100 MG IV SOLR
5.0000 mg/h | INTRAVENOUS | Status: DC
  Filled 2013-11-23: qty 100

## 2013-11-23 MED ORDER — METHYLPREDNISOLONE SODIUM SUCC 125 MG IJ SOLR
80.0000 mg | Freq: Four times a day (QID) | INTRAMUSCULAR | Status: DC
  Administered 2013-11-23 – 2013-11-25 (×7): 80 mg via INTRAVENOUS
  Filled 2013-11-23 (×8): qty 2

## 2013-11-23 NOTE — Progress Notes (Signed)
UR chart review completed.  

## 2013-11-23 NOTE — Care Management Note (Addendum)
    Page 1 of 2   11/30/2013     1:33:20 PM   CARE MANAGEMENT NOTE 11/30/2013  Patient:  Kenneth Merritt, Kenneth Merritt   Account Number:  [id number]  Date Initiated:  11/23/2013  Documentation initiated by:  Theophilus Kinds  Subjective/Objective Assessment:   Pt admitted from home with COPD exacerbation. Pt lives with his wife and will return home at discharge. Pt is independent with ADL's.     Action/Plan:   Will need to assess need for home O2 closer to discharge. Will continue to follow.   Anticipated DC Date:  11/26/2013   Anticipated DC Plan:  [name]  CM consult      PAC Choice  DURABLE MEDICAL EQUIPMENT   Choice offered to / List presented to:  C-1 Patient   DME arranged  [facility]      DME agency  [place].        Status of service:  Completed, signed off Medicare Important Message given?  YES (If response is "NO", the following Medicare IM given date fields will be blank) Date Medicare IM given:  11/30/2013 Date Additional Medicare IM given:    Discharge Disposition:  HOME/SELF CARE  Per UR Regulation:    If discussed at Long Length of Stay Meetings, dates discussed:    Comments:  11/30/13 [place], RN BSN CM Pt discharged home today with home O2 with [facility]. [facility] to deliver portable to pts room prior to discharge and will deliver concentrator to pts home after discharge. No other CM needs noted. Pt and pts nurse aware of discharge arrangements. 11/23/13 Rol[place], RN BSN CM

## 2013-11-23 NOTE — Progress Notes (Signed)
Patient seen and examined.  Above note reviewed.  Patient has been admitted with copd exacerbation and acute respiratory failure. He is currently on 2L of oxygen and appears to have increased work of breathing this morning. He is maintaining his oxygen saturations and has increased work of breathing around his coughing spells. He has fair air movement bilaterally and has bilateral expiratory wheezes.  He is on intravenous steroids, antibiotics and scheduled and prn bronchodilators.  Would continue with current treatments for now and monitor closely.  Talin Rozeboom

## 2013-11-23 NOTE — Progress Notes (Signed)
[facility] PROGRESS NOTE  TIONNE DAYHOFF ZOX:[id number] DOB: Nov 30, 1950 DOA: 11/22/2013 PCP: Neale Burly, MD  Assessment/Plan: Respiratory distress: Related to COPD exacerbation secondary to smoke exposure. Worsening this am with frequent coughing. Unable to complete sentences. Saturation level 98% on 2 L. Will increase Solu-Medrol and add hydodan. Will continue Levaquin day #2 and  Xopenex nebulizers. If no improvement consider pulmonary consult. of note, patient unable to tolerate BiPap in ED yesterday.  Active Problems:  COPD exacerbation: See above.  Likely related to exposure outdoor smoke last week. He is not on home oxygen but does have home nebulizers. Will continue Xopenex nebs, Solu-Medrol as above, Levaquin day #2.  Monitor oxygen saturation level.    Sinus tachycardia: Most likely related to nebulizer treatments. Nebulizers changed to Xopenex yesterday. TSH within the limits of normal. Slow improvement with po Cardizem. Will increase po dose and add cardizem gtt.    Long term (current) use of of anticoagulants: hx of afib primarily when in hospital and a stress situation per cardiac note. On Eliquis. Will continue for now.   Chest pain: Resolved. Is likely musculoskeletal from coughing in the setting of history of muscle spasms as well. Given his history will go ahead and cycle his troponins. Chart review indicates catheterization in 2010 with 70% proximal LAD and 30% proximal RCA. Overall LV function. Troponin neg x3.   BRBPR (bright red blood per rectum): Patient reports 2 episodes prior to admission. Hemoglobin is stable. Patient is on a Eliquis will continue this for now. No further episodes. If change in his hemoglobin or further episodes we'll consider GI consult. Will need outpatient followup otherwise   CAD (coronary artery disease), native coronary artery: Presentation 2010 with 70% proximal LAD and 30% proximal RCA. Her mid chest pain as noted above. Continue statin.  Lipid panel within the limits of normal.    GERD (gastroesophageal reflux disease) stable at baseline will continue home PPI    Code Status: full Family Communication: none present Disposition Plan: home when ready   Consultants:  none  Procedures:  none  Antibiotics:  Levaquin 11/22/13>>  HPI/Subjective: Sitting up in bed drinking coffee. Frequent coughing and mild resp distress with conversation  Objective: Filed Vitals:   11/23/13 0800  BP: 123/76  Pulse: 119  Temp:   Resp: 22    Intake/Output Summary (Last 24 hours) at 11/23/13 0840 Last data filed at 11/23/13 0500  Gross per 24 hour  Intake    680 ml  Output   1500 ml  Net   -820 ml   Filed Weights   11/22/13 0442 11/22/13 0759 11/23/13 0500  Weight: 63.504 kg (140 lb) 62.9 kg (138 lb 10.7 oz) 61.8 kg (136 lb 3.9 oz)    Exam:   General:  Somewhat ill appearing, mild distress  Cardiovascular: tachycardic but regular. No m/g/r/. No LE edema  Respiratory: moderate increase work of breathing with conversation. Improved air movement but prolonged expiratory phase, diffuse wheeze  Abdomen: round but soft +BS non-tender to palpation  Musculoskeletal: no clubbing no cyanosis   Data Reviewed: Basic Metabolic Panel:  Recent Labs Lab 11/22/13 0444 11/23/13 0555  NA 143 143  K 4.0 4.9  CL 102 103  CO2 28 27  GLUCOSE 111* 158*  BUN 11 14  CREATININE 0.75 0.71  CALCIUM 9.6 9.5   Liver Function Tests:  Recent Labs Lab 11/22/13 0943  AST 27  ALT 24  ALKPHOS 49  BILITOT 0.4  PROT 7.3  ALBUMIN 3.6  No results found for this basename: LIPASE, AMYLASE,  in the last 168 hours No results found for this basename: AMMONIA,  in the last 168 hours CBC:  Recent Labs Lab 11/22/13 0444 11/23/13 0555  WBC 9.4 13.7*  NEUTROABS 6.9  --   HGB 14.4 14.4  HCT 45.4 42.6  MCV 89.5 90.8  PLT 208 211   Cardiac Enzymes:  Recent Labs Lab 11/22/13 0444 11/22/13 0943 11/22/13 1154  TROPONINI  <0.30 <0.30 <0.30   BNP (last 3 results) No results found for this basename: PROBNP,  in the last 8760 hours CBG: No results found for this basename: GLUCAP,  in the last 168 hours  Recent Results (from the past 240 hour(s))  MRSA PCR SCREENING     Status: None   Collection Time    11/22/13  8:35 AM      Result Value Range Status   MRSA by PCR NEGATIVE  NEGATIVE Final   Comment:            The GeneXpert MRSA Assay (FDA     approved for NASAL specimens     only), is one component of a     comprehensive MRSA colonization     surveillance program. It is not     intended to diagnose MRSA     infection nor to guide or     monitor treatment for     MRSA infections.     Studies: Dg Chest Port 1 View  11/22/2013   CLINICAL DATA:  Shortness of breath, history of COPD.  EXAM: PORTABLE CHEST - 1 VIEW  COMPARISON:  Chest radiograph October 19, 2013  FINDINGS: Cardiomediastinal silhouette is unremarkable unchanged. Increased lung volumes, diffuse mild chronic interstitial changes with biapical pleural parenchymal scarring. No pleural effusions though, the costophrenic angles are incompletely imaged. No pneumothorax.  Multiple EKG lines overlie the patient and may obscure subtle underlying pathology. Soft tissue planes and included osseous structures are nonsuspicious.  IMPRESSION: COPD without superimposed acute cardiopulmonary process.   Electronically Signed   By: Elon Alas   On: 11/22/2013 05:09    Scheduled Meds: . apixaban  5 mg Oral BID  . atorvastatin  20 mg Oral q1800  . benzonatate  100 mg Oral TID  . diltiazem  60 mg Oral Q6H  . guaiFENesin  600 mg Oral BID  . levalbuterol  0.63 mg Nebulization Q4H  . levofloxacin  750 mg Intravenous Q24H  . methylPREDNISolone (SOLU-MEDROL) injection  80 mg Intravenous Q6H  . pantoprazole  40 mg Oral Daily  . senna  1 tablet Oral BID  . sodium chloride  3 mL Intravenous Q12H   Continuous Infusions: . diltiazem (CARDIZEM) infusion       Principal Problem:   Respiratory distress Active Problems:   GERD (gastroesophageal reflux disease)   CAD (coronary artery disease), native coronary artery   Long term (current) use of anticoagulants   COPD exacerbation   Sinus tachycardia   Chest pain   BRBPR (bright red blood per rectum)   PAF (paroxysmal atrial fibrillation)    Time spent: 35 minutes    [facility] Pager [phone]. If 7PM-7AM, please contact night-coverage at [email or web address], password [facility] 11/23/2013, 8:40 AM  LOS: 1 day

## 2013-11-24 DIAGNOSIS — D72829 Elevated white blood cell count, unspecified: Secondary | ICD-10-CM | POA: Diagnosis present

## 2013-11-24 DIAGNOSIS — Z7901 Long term (current) use of anticoagulants: Secondary | ICD-10-CM

## 2013-11-24 LAB — BASIC METABOLIC PANEL
BUN: 17 mg/dL (ref 6–23)
CHLORIDE: 100 meq/L (ref 96–112)
CO2: 28 mEq/L (ref 19–32)
Calcium: 8.9 mg/dL (ref 8.4–10.5)
Creatinine, Ser: 0.65 mg/dL (ref 0.50–1.35)
GFR calc Af Amer: >90 mL/min (ref >90)
GLUCOSE: 156 mg/dL — AB (ref 70–99)
POTASSIUM: 4.4 meq/L (ref 3.7–5.3)
SODIUM: 140 meq/L (ref 137–147)

## 2013-11-24 LAB — CBC
HEMATOCRIT: 41.7 % (ref 39.0–52.0)
HEMOGLOBIN: 13.9 g/dL (ref 13.0–17.0)
MCH: 30.3 pg (ref 26.0–34.0)
MCHC: 33.3 g/dL (ref 30.0–36.0)
MCV: 91 fL (ref 78.0–100.0)
Platelets: 208 10*3/uL (ref 150–400)
RBC: 4.58 MIL/uL (ref 4.22–5.81)
RDW: 13.8 % (ref 11.5–15.5)
WBC: 15.2 10*3/uL — ABNORMAL HIGH (ref 4.0–10.5)

## 2013-11-24 NOTE — Progress Notes (Signed)
Patient seen and examined. The above note reviewed.  Patient continues to be short of breath. He has ongoing wheezing with diminished breath sounds bilaterally. He continues he uses accessory muscles to breathe. Oxygenation appears to be adequate, although he still has increased work of breathing. We'll continue with current treatments. Continue to monitor in the stepdown unit for now.  Annisha Baar

## 2013-11-24 NOTE — Progress Notes (Signed)
[facility] PROGRESS NOTE  MANDEL SEIDEN TIR:[id number] DOB: 09-Jun-1951 DOA: 11/22/2013 PCP: Neale Burly, MD  Assessment/Plan: Respiratory distress: Related to COPD exacerbation secondary to smoke exposure. Some improvement this am. Continues with coughing but less of it. Saturation level 98% on 3 L.  continue Solu-Medrol and hydodan. Will continue Levaquin day #3 and Xopenex nebulizers. Will attempt to wean oxygen. Likely transfer to floor this afternoon.  Active Problems:  COPD exacerbation: See above. Likely related to exposure outdoor smoke last week. He is not on home oxygen but does have home nebulizers. Will continue Xopenex nebs, Solu-Medrol as above, Levaquin day #3. Monitor oxygen saturation level.   Sinus tachycardia: Improved when not coughing. cardizem drip titrated off last evening. Continue po cardizem. Will transition to 24hour tab likely tomorrow. Xopenex nebs. TSH within the limits of normal.    Long term (current) use of of anticoagulants: hx of afib primarily when in hospital and a stress situation per cardiac note. On Eliquis. Will continue for now.   Chest pain: Resolved. Is likely musculoskeletal from coughing in the setting of history of muscle spasms as well. Troponins negative x3. Chart review indicates catheterization in 2010 with 70% proximal LAD and 30% proximal RCA. Overall LV function.    BRBPR (bright red blood per rectum): Patient reports 2 episodes prior to admission. Hemoglobin is stable. Patient is on a Eliquis will continue this for now. No further episodes. If change in his hemoglobin or further episodes we'll consider GI consult. Will need outpatient followup otherwise   CAD (coronary artery disease), native coronary artery: Presentation 2010 with 70% proximal LAD and 30% proximal RCA.  Continue statin. Lipid panel within the limits of normal.   GERD (gastroesophageal reflux disease) stable at baseline will continue home PPI   Leukocytosis: related  to steroids. Patient is afebrile and non-toxic appearing.   Code Status: full Family Communication: none present Disposition Plan: home when ready   Consultants:  none  Procedures:  none  Antibiotics:  Levaquin 11/22/13>>>  HPI/Subjective: Sitting on side of bed. Mild resp distress due to coughing episode.   Objective: Filed Vitals:   11/24/13 0700  BP:   Pulse: 85  Temp:   Resp: 16    Intake/Output Summary (Last 24 hours) at 11/24/13 0832 Last data filed at 11/23/13 1800  Gross per 24 hour  Intake    110 ml  Output      0 ml  Net    110 ml   Filed Weights   11/22/13 0759 11/23/13 0500 11/24/13 0500  Weight: 62.9 kg (138 lb 10.7 oz) 61.8 kg (136 lb 3.9 oz) 62.1 kg (136 lb 14.5 oz)    Exam:   General:  Well nourished, calm  Cardiovascular: tachycardic but regular. No MGR no LE edema  Respiratory: mild increased work of breathing with coughing episodes. Improved air movement. Decreased wheezing no crackles  Abdomen: soft +BS non-tender to palpation  Musculoskeletal: no clubbing or cyanosis   Data Reviewed: Basic Metabolic Panel:  Recent Labs Lab 11/22/13 0444 11/23/13 0555 11/24/13 0500  NA 143 143 140  K 4.0 4.9 4.4  CL 102 103 100  CO2 28 27 28   GLUCOSE 111* 158* 156*  BUN 11 14 17   CREATININE 0.75 0.71 0.65  CALCIUM 9.6 9.5 8.9   Liver Function Tests:  Recent Labs Lab 11/22/13 0943  AST 27  ALT 24  ALKPHOS 49  BILITOT 0.4  PROT 7.3  ALBUMIN 3.6   No results found for  this basename: LIPASE, AMYLASE,  in the last 168 hours No results found for this basename: AMMONIA,  in the last 168 hours CBC:  Recent Labs Lab 11/22/13 0444 11/23/13 0555 11/24/13 0500  WBC 9.4 13.7* 15.2*  NEUTROABS 6.9  --   --   HGB 14.4 14.4 13.9  HCT 45.4 42.6 41.7  MCV 89.5 90.8 91.0  PLT 208 211 208   Cardiac Enzymes:  Recent Labs Lab 11/22/13 0444 11/22/13 0943 11/22/13 1154  TROPONINI <0.30 <0.30 <0.30   BNP (last 3 results) No  results found for this basename: PROBNP,  in the last 8760 hours CBG: No results found for this basename: GLUCAP,  in the last 168 hours  Recent Results (from the past 240 hour(s))  MRSA PCR SCREENING     Status: None   Collection Time    11/22/13  8:35 AM      Result Value Range Status   MRSA by PCR NEGATIVE  NEGATIVE Final   Comment:            The GeneXpert MRSA Assay (FDA     approved for NASAL specimens     only), is one component of a     comprehensive MRSA colonization     surveillance program. It is not     intended to diagnose MRSA     infection nor to guide or     monitor treatment for     MRSA infections.     Studies: No results found.  Scheduled Meds: . apixaban  5 mg Oral BID  . atorvastatin  20 mg Oral q1800  . diltiazem  60 mg Oral Q6H  . guaiFENesin  1,200 mg Oral BID  . ipratropium  0.5 mg Nebulization Q4H  . levalbuterol  0.63 mg Nebulization Q4H  . levofloxacin  750 mg Intravenous Q24H  . methylPREDNISolone (SOLU-MEDROL) injection  80 mg Intravenous Q6H  . pantoprazole  40 mg Oral Daily  . senna  1 tablet Oral BID  . sodium chloride  3 mL Intravenous Q12H   Continuous Infusions: . diltiazem (CARDIZEM) infusion      Principal Problem:   Respiratory distress Active Problems:   GERD (gastroesophageal reflux disease)   CAD (coronary artery disease), native coronary artery   Long term (current) use of anticoagulants   COPD exacerbation   Sinus tachycardia   Chest pain   BRBPR (bright red blood per rectum)   PAF (paroxysmal atrial fibrillation)    Time spent: 35 minutes    [facility] Pager [phone]. If 7PM-7AM, please contact night-coverage at [email or web address], password [facility] 11/24/2013, 8:32 AM  LOS: 2 days

## 2013-11-25 LAB — CBC
HEMATOCRIT: 42.5 % (ref 39.0–52.0)
Hemoglobin: 14.1 g/dL (ref 13.0–17.0)
MCH: 30.1 pg (ref 26.0–34.0)
MCHC: 33.2 g/dL (ref 30.0–36.0)
MCV: 90.8 fL (ref 78.0–100.0)
Platelets: 217 10*3/uL (ref 150–400)
RBC: 4.68 MIL/uL (ref 4.22–5.81)
RDW: 13.6 % (ref 11.5–15.5)
WBC: 16 10*3/uL — AB (ref 4.0–10.5)

## 2013-11-25 MED ORDER — DILTIAZEM HCL 60 MG PO TABS
90.0000 mg | ORAL_TABLET | Freq: Four times a day (QID) | ORAL | Status: DC
  Administered 2013-11-25 – 2013-11-30 (×19): 90 mg via ORAL
  Filled 2013-11-25 (×40): qty 1

## 2013-11-25 MED ORDER — METOPROLOL TARTRATE 50 MG PO TABS
50.0000 mg | ORAL_TABLET | Freq: Two times a day (BID) | ORAL | Status: DC
  Administered 2013-11-25 – 2013-11-26 (×2): 50 mg via ORAL
  Filled 2013-11-25 (×2): qty 2

## 2013-11-25 MED ORDER — METHYLPREDNISOLONE SODIUM SUCC 125 MG IJ SOLR
80.0000 mg | Freq: Four times a day (QID) | INTRAMUSCULAR | Status: DC
  Administered 2013-11-25 – 2013-11-26 (×4): 80 mg via INTRAVENOUS
  Filled 2013-11-25 (×4): qty 2

## 2013-11-25 MED ORDER — METHYLPREDNISOLONE SODIUM SUCC 125 MG IJ SOLR
80.0000 mg | INTRAMUSCULAR | Status: DC
  Administered 2013-11-25: 80 mg via INTRAVENOUS
  Filled 2013-11-25: qty 2

## 2013-11-25 MED ORDER — MOMETASONE FURO-FORMOTEROL FUM 200-5 MCG/ACT IN AERO
2.0000 | INHALATION_SPRAY | Freq: Two times a day (BID) | RESPIRATORY_TRACT | Status: DC
  Administered 2013-11-25 – 2013-11-30 (×11): 2 via RESPIRATORY_TRACT
  Filled 2013-11-25: qty 8.8

## 2013-11-25 NOTE — Progress Notes (Signed)
Patient seen and examined. Above note reviewed.  Patient reports that he feels mildly improved. He still continues to feel short of breath. He does not have a significant cough. He continues to wheeze. Oxygen requirements are improving. He is currently saturating in the high 90s on 2 L. I feel that some component of his wheeze may be related to forced expiration. Lung sounds are otherwise diminished bilaterally. We'll continue with current treatments. At long-acting bronchodilators and inhaled steroids. Transferred to telemetry.  Micaila Ziemba

## 2013-11-25 NOTE — Progress Notes (Signed)
[facility] PROGRESS NOTE  YASHAS CAMILLI UYQ:[id number] DOB: Apr 17, 1951 DOA: 11/22/2013 PCP: Neale Burly, MD  Assessment/Plan: Respiratory distress: Related to COPD exacerbation secondary to smoke exposure. Some improvement this am in terms of effort but remains tight with wheezes.  Continues with coughing but less of it. Saturation level 98% on 2 L. Will increase Solu-Medrol and continue hydodan. Will continue Levaquin day #4 and Xopenex nebulizers. Will  Continue to attempt to wean oxygen.   Active Problems:   COPD exacerbation: See above. Likely related to exposure outdoor smoke last week. He is not on home oxygen but does have home nebulizers. Will continue Xopenex nebs, Solu-Medrol as above, Levaquin day #4. Monitor oxygen saturation level.   Sinus tachycardia: HR 90-117. Will increase po cardizem. Will transition to 24hour tab likely tomorrow. Xopenex nebs. TSH within the limits of normal.   Long term (current) use of of anticoagulants: hx of afib primarily when in hospital and a stress situation per cardiac note. On Eliquis. Will continue for now.   Chest pain: Resolved. Is likely musculoskeletal from coughing in the setting of history of muscle spasms as well. Troponins negative x3. Chart review indicates catheterization in 2010 with 70% proximal LAD and 30% proximal RCA. Overall LV function.   BRBPR (bright red blood per rectum): Patient reports 2 episodes prior to admission. Hemoglobin is stable. Patient is on a Eliquis will continue this for now. No further episodes. If change in his hemoglobin or further episodes we'll consider GI consult. Will need outpatient followup otherwise   CAD (coronary artery disease), native coronary artery: Presentation 2010 with 70% proximal LAD and 30% proximal RCA. Continue statin. Lipid panel within the limits of normal.   GERD (gastroesophageal reflux disease) stable at baseline will continue home PPI   Leukocytosis: related to steroids.  Patient is afebrile and non-toxic appearing.     Code Status: full Family Communication: none present Disposition Plan: home when ready   Consultants:  none  Procedures:  none  Antibiotics:  levaquin 11/22/13>>  HPI/Subjective: Sitting on side of bed eating. Reports breathing "a little better".   Objective: Filed Vitals:   11/25/13 0800  BP: 142/107  Pulse: 105  Temp:   Resp: 18    Intake/Output Summary (Last 24 hours) at 11/25/13 0925 Last data filed at 11/25/13 6387  Gross per 24 hour  Intake    150 ml  Output    400 ml  Net   -250 ml   Filed Weights   11/23/13 0500 11/24/13 0500 11/25/13 0500  Weight: 61.8 kg (136 lb 3.9 oz) 62.1 kg (136 lb 14.5 oz) 62.7 kg (138 lb 3.7 oz)    Exam:   General:  Calm appears comfortable  Cardiovascular: tachy but regular No LE edema no m/g/r  Respiratory: mild increased work of breathing with conversation. Less use of accessory muscles. Improved air flow but diffuse expiratory wheeze  Abdomen: +BS soft non-distended  Musculoskeletal: no clubbing or cyanosis   Data Reviewed: Basic Metabolic Panel:  Recent Labs Lab 11/22/13 0444 11/23/13 0555 11/24/13 0500  NA 143 143 140  K 4.0 4.9 4.4  CL 102 103 100  CO2 28 27 28   GLUCOSE 111* 158* 156*  BUN 11 14 17   CREATININE 0.75 0.71 0.65  CALCIUM 9.6 9.5 8.9   Liver Function Tests:  Recent Labs Lab 11/22/13 0943  AST 27  ALT 24  ALKPHOS 49  BILITOT 0.4  PROT 7.3  ALBUMIN 3.6   No results found for  this basename: LIPASE, AMYLASE,  in the last 168 hours No results found for this basename: AMMONIA,  in the last 168 hours CBC:  Recent Labs Lab 11/22/13 0444 11/23/13 0555 11/24/13 0500 11/25/13 0509  WBC 9.4 13.7* 15.2* 16.0*  NEUTROABS 6.9  --   --   --   HGB 14.4 14.4 13.9 14.1  HCT 45.4 42.6 41.7 42.5  MCV 89.5 90.8 91.0 90.8  PLT 208 211 208 217   Cardiac Enzymes:  Recent Labs Lab 11/22/13 0444 11/22/13 0943 11/22/13 1154  TROPONINI  <0.30 <0.30 <0.30   BNP (last 3 results) No results found for this basename: PROBNP,  in the last 8760 hours CBG: No results found for this basename: GLUCAP,  in the last 168 hours  Recent Results (from the past 240 hour(s))  MRSA PCR SCREENING     Status: None   Collection Time    11/22/13  8:35 AM      Result Value Range Status   MRSA by PCR NEGATIVE  NEGATIVE Final   Comment:            The GeneXpert MRSA Assay (FDA     approved for NASAL specimens     only), is one component of a     comprehensive MRSA colonization     surveillance program. It is not     intended to diagnose MRSA     infection nor to guide or     monitor treatment for     MRSA infections.     Studies: No results found.  Scheduled Meds: . apixaban  5 mg Oral BID  . atorvastatin  20 mg Oral q1800  . diltiazem  60 mg Oral Q6H  . ipratropium  0.5 mg Nebulization Q4H  . levalbuterol  0.63 mg Nebulization Q4H  . levofloxacin  750 mg Intravenous Q24H  . methylPREDNISolone (SOLU-MEDROL) injection  80 mg Intravenous Q4H  . pantoprazole  40 mg Oral Daily  . senna  1 tablet Oral BID  . sodium chloride  3 mL Intravenous Q12H   Continuous Infusions: . diltiazem (CARDIZEM) infusion      Principal Problem:   Respiratory distress Active Problems:   GERD (gastroesophageal reflux disease)   CAD (coronary artery disease), native coronary artery   Long term (current) use of anticoagulants   COPD exacerbation   Sinus tachycardia   Chest pain   BRBPR (bright red blood per rectum)   PAF (paroxysmal atrial fibrillation)   Leukocytosis, unspecified    Time spent: 35 minutes    [place] Pager [phone]. If 7PM-7AM, please contact night-coverage at [email or web address], password [facility] 11/25/2013, 9:25 AM  LOS: 3 days

## 2013-11-25 NOTE — Progress Notes (Signed)
PT TRANSFERRING TO ROOM 314. PT ALERT ND ORIENTED. SKIN WARM AND DRY. HR 89 IN SINUS RHYTHM. O2 AT 3L/MIN VIA [place]. PT HAS EXPIRATORY WHEEZING WHICH HE STATES IS CHRONIC. CONTINUES TO HAVE A POOR APPETITE. LT AC NSL REMAINS PATENT. TRANSFER REPORT CALLED TO ASHLEY RN ON 300.

## 2013-11-26 ENCOUNTER — Inpatient Hospital Stay (HOSPITAL_COMMUNITY): Payer: Medicare Other

## 2013-11-26 LAB — CBC
HEMATOCRIT: 41.4 % (ref 39.0–52.0)
HEMOGLOBIN: 13.6 g/dL (ref 13.0–17.0)
MCH: 30 pg (ref 26.0–34.0)
MCHC: 32.9 g/dL (ref 30.0–36.0)
MCV: 91.4 fL (ref 78.0–100.0)
Platelets: 236 10*3/uL (ref 150–400)
RBC: 4.53 MIL/uL (ref 4.22–5.81)
RDW: 13.8 % (ref 11.5–15.5)
WBC: 15.3 10*3/uL — ABNORMAL HIGH (ref 4.0–10.5)

## 2013-11-26 LAB — BLOOD GAS, ARTERIAL
Acid-Base Excess: 5.3 mmol/L — ABNORMAL HIGH (ref 0.0–2.0)
BICARBONATE: 30.4 meq/L — AB (ref 20.0–24.0)
Drawn by: 25788
O2 Content: 3.5 L/min
O2 SAT: 94.2 %
PO2 ART: 74 mmHg — AB (ref 80.0–100.0)
Patient temperature: 37
TCO2: 27.1 mmol/L (ref 0–100)
pCO2 arterial: 55.1 mmHg — ABNORMAL HIGH (ref 35.0–45.0)
pH, Arterial: 7.361 (ref 7.350–7.450)

## 2013-11-26 LAB — BASIC METABOLIC PANEL
BUN: 22 mg/dL (ref 6–23)
CALCIUM: 8.8 mg/dL (ref 8.4–10.5)
CO2: 31 meq/L (ref 19–32)
CREATININE: 0.67 mg/dL (ref 0.50–1.35)
Chloride: 96 mEq/L (ref 96–112)
GFR calc Af Amer: >90 mL/min (ref >90)
GLUCOSE: 176 mg/dL — AB (ref 70–99)
Potassium: 5.4 mEq/L — ABNORMAL HIGH (ref 3.7–5.3)
SODIUM: 138 meq/L (ref 137–147)

## 2013-11-26 LAB — POTASSIUM: Potassium: 5.3 mEq/L (ref 3.7–5.3)

## 2013-11-26 MED ORDER — IOHEXOL 300 MG/ML  SOLN
80.0000 mL | Freq: Once | INTRAMUSCULAR | Status: AC | PRN
  Administered 2013-11-26: 80 mL via INTRAVENOUS

## 2013-11-26 MED ORDER — ALBUTEROL SULFATE HFA 108 (90 BASE) MCG/ACT IN AERS
2.0000 | INHALATION_SPRAY | RESPIRATORY_TRACT | Status: DC | PRN
  Administered 2013-11-26 (×2): 2 via RESPIRATORY_TRACT
  Filled 2013-11-26 (×2): qty 6.7

## 2013-11-26 MED ORDER — METHYLPREDNISOLONE SODIUM SUCC 125 MG IJ SOLR
125.0000 mg | Freq: Four times a day (QID) | INTRAMUSCULAR | Status: DC
  Administered 2013-11-26 – 2013-11-30 (×16): 125 mg via INTRAVENOUS
  Filled 2013-11-26 (×16): qty 2

## 2013-11-26 NOTE — Progress Notes (Signed)
11/26/13 1634 Notified Dr. Roderic Palau of ABG and CT of chest results. No new orders needed at this time. Pt sitting up on side of bed, states comfortable. States shortness of breath decreased with neb treatment. bedalarm on for safety, instructed to call for assist and not get up on his own. Call light within reach. States will call. Donavan Foil, RN

## 2013-11-26 NOTE — Progress Notes (Signed)
[facility] PROGRESS NOTE  Kenneth Merritt ZOX:[id number] DOB: [birth year] DOA: 11/22/2013 PCP: Neale Burly, MD  Assessment/Plan: Acute respiratory failure: Related to COPD exacerbation secondary to smoke exposure. Continues to have increased work of breathing and audible wheezing.  Will increase steroids, continue bronchodilators, antibiotics. He is on inhaled bronchodilators and long acting beta agonists.  Chest xrays have not shown significant findings thus far.  Will obtain CT chest to rule out any other underlying cause.   Active Problems:   COPD exacerbation: See above. Likely related to exposure outdoor smoke last week. He is not on home oxygen but does have home nebulizers. Will continue Xopenex nebs, Solu-Medrol as above, Levaquin day #4. Monitor oxygen saturation level. Very slow progression.  Atrial fibrillation:Patient transitioned to atrial fib overnight.  Lopressor was added to regimen.  Today he is bradycardic.  Lopressor was discontinued and he is continued on cardizem.  TSH within the limits of normal.  He is anticoagulated on apixiban  Long term (current) use of of anticoagulants: hx of afib primarily when in hospital and a stress situation per cardiac note. On Eliquis. Will continue for now.   Chest pain: Resolved. Is likely musculoskeletal from coughing in the setting of history of muscle spasms as well. Troponins negative x3. Chart review indicates catheterization in 2010 with 70% proximal LAD and 30% proximal RCA. Overall LV function.   BRBPR (bright red blood per rectum): Patient reports 2 episodes prior to admission. Hemoglobin is stable. Patient is on a Eliquis will continue this for now. No further episodes. If change in his hemoglobin or further episodes we'll consider GI consult. Will need outpatient followup otherwise   CAD (coronary artery disease), native coronary artery: Presentation 2010 with 70% proximal LAD and 30% proximal RCA. Continue statin. Lipid panel  within the limits of normal.   GERD (gastroesophageal reflux disease) stable at baseline will continue home PPI   Leukocytosis: related to steroids. Patient is afebrile and non-toxic appearing.     Code Status: full Family Communication: none present Disposition Plan: home when ready   Consultants:  none  Procedures:  none  Antibiotics:  levaquin 11/22/13>>  HPI/Subjective: Breathing is worse today.  Non productive cough. Continued wheezing  Objective: Filed Vitals:   11/26/13 1014  BP:   Pulse: 67  Temp:   Resp:     Intake/Output Summary (Last 24 hours) at 11/26/13 1338 Last data filed at 11/26/13 0836  Gross per 24 hour  Intake      3 ml  Output    400 ml  Net   -397 ml   Filed Weights   11/24/13 0500 11/25/13 0500 11/26/13 0424  Weight: 62.1 kg (136 lb 14.5 oz) 62.7 kg (138 lb 3.7 oz) 64.2 kg (141 lb 8.6 oz)    Exam:   General:  Has moderate resp distress. Using accessory muscles to breath  Cardiovascular: RRR No LE edema no m/g/r  Respiratory: diffuse exp wheezing. Using accessory muscles. Diminished air sounds bilaterally  Abdomen: +BS soft non-distended  Musculoskeletal: no clubbing or cyanosis   Data Reviewed: Basic Metabolic Panel:  Recent Labs Lab 11/22/13 0444 11/23/13 0555 11/24/13 0500 11/26/13 0647  NA 143 143 140 138  K 4.0 4.9 4.4 5.4*  CL 102 103 100 96  CO2 28 27 28 31   GLUCOSE 111* 158* 156* 176*  BUN 11 14 17 22   CREATININE 0.75 0.71 0.65 0.67  CALCIUM 9.6 9.5 8.9 8.8   Liver Function Tests:  Recent Labs Lab 11/22/13  0943  AST 27  ALT 24  ALKPHOS 49  BILITOT 0.4  PROT 7.3  ALBUMIN 3.6   No results found for this basename: LIPASE, AMYLASE,  in the last 168 hours No results found for this basename: AMMONIA,  in the last 168 hours CBC:  Recent Labs Lab 11/22/13 0444 11/23/13 0555 11/24/13 0500 11/25/13 0509 11/26/13 0647  WBC 9.4 13.7* 15.2* 16.0* 15.3*  NEUTROABS 6.9  --   --   --   --   HGB 14.4  14.4 13.9 14.1 13.6  HCT 45.4 42.6 41.7 42.5 41.4  MCV 89.5 90.8 91.0 90.8 91.4  PLT 208 211 208 217 236   Cardiac Enzymes:  Recent Labs Lab 11/22/13 0444 11/22/13 0943 11/22/13 1154  TROPONINI <0.30 <0.30 <0.30   BNP (last 3 results) No results found for this basename: PROBNP,  in the last 8760 hours CBG: No results found for this basename: GLUCAP,  in the last 168 hours  Recent Results (from the past 240 hour(s))  MRSA PCR SCREENING     Status: None   Collection Time    11/22/13  8:35 AM      Result Value Range Status   MRSA by PCR NEGATIVE  NEGATIVE Final   Comment:            The GeneXpert MRSA Assay (FDA     approved for NASAL specimens     only), is one component of a     comprehensive MRSA colonization     surveillance program. It is not     intended to diagnose MRSA     infection nor to guide or     monitor treatment for     MRSA infections.     Studies: No results found.  Scheduled Meds: . apixaban  5 mg Oral BID  . atorvastatin  20 mg Oral q1800  . diltiazem  90 mg Oral Q6H  . ipratropium  0.5 mg Nebulization Q4H  . levalbuterol  0.63 mg Nebulization Q4H  . levofloxacin  750 mg Intravenous Q24H  . methylPREDNISolone (SOLU-MEDROL) injection  125 mg Intravenous Q6H  . mometasone-formoterol  2 puff Inhalation BID  . pantoprazole  40 mg Oral Daily  . senna  1 tablet Oral BID  . sodium chloride  3 mL Intravenous Q12H   Continuous Infusions: . diltiazem (CARDIZEM) infusion      Principal Problem:   Respiratory distress Active Problems:   GERD (gastroesophageal reflux disease)   CAD (coronary artery disease), native coronary artery   Long term (current) use of anticoagulants   COPD exacerbation   Sinus tachycardia   Chest pain   BRBPR (bright red blood per rectum)   PAF (paroxysmal atrial fibrillation)   Leukocytosis, unspecified    Time spent: 35 minutes    [place] [facility] Pager [phone]. If 7PM-7AM, please contact  night-coverage at [email or web address], password [facility] 11/26/2013, 1:38 PM  LOS: 4 days

## 2013-11-26 NOTE — Progress Notes (Signed)
Pt has shown little to no   improvement with his wheezing through out night.  Lung sounds are decreased with lots of upper airway wheezing. Sat's have decreased to 91 on 2lpm/[place]. Nursing notified.

## 2013-11-26 NOTE — Progress Notes (Signed)
Patient c/o SOB. Increased work of breathing and wheezing noted. O2 sat ranging from 91%-97% on 2L oxygen. Patient states that the NEB treatments are not helping and he believes he would get better relief from an albuterol inhaler like he uses at home. Contacted MD on call. Orders received. Will continue to monitor closely.

## 2013-11-26 NOTE — Progress Notes (Signed)
11/26/13 1746 Notified Dr. Roderic Palau patient heart rate in the 80s-90s this afternoon, back to 40s-50s this evening. Stated to hold diltiazem dose this evening. Nursing to continue to monitor. Donavan Foil, RN

## 2013-11-26 NOTE — Progress Notes (Signed)
11/26/13 1818 Patient ambulated to bathroom, grand-daughter at bedside. Small amount of bright, red blood noted to bedpad. Patient states has had some bright red blood occasionally at home with bowel movements. Small stool, small amount of bright red blood noted. Patient became dyspneic, general weakness while up in room. Assisted back to bed. VSS. O2 sats at 96-98% on O2 at 2.5 lpm. Notified RT, neb treatment given as ordered. RT assisted patient with flutter valve and incentive spirometer. Pt demonstrates correct use. Upper congestion clears some when patient encouraged to cough/deep breathe. Denies pain. Bedalarm on for safety. Call light within reach, instructed to call for assist. Grand-daughter at bedside. Notified Dr. Roderic Palau of bleeding with stool. No new orders at this time, nursing to monitor. Donavan Foil, RN

## 2013-11-26 NOTE — Progress Notes (Signed)
11/26/13 1122 Notified Dr. Roderic Palau patient afib, HR brady in 40s-50s. Received metoprolol as ordered, restarted last night due to afib with RVR per report. Metoprolol d/c'd per MD. Hold diltiazem dose until no longer bradycardic. Notified patient maintaining O2 sats 98-100% on 3.5 lpm. Expiratory wheezing persists throughout, crackles noted to lower lung lobes. Xanax 0.5 mg po given for anxiety as ordered. Pt up to chair, asleep at this time. Appears comfortable, no signs of distress. Donavan Foil, RN

## 2013-11-26 NOTE — Progress Notes (Signed)
11/26/13 6773 Patient noted to have audible wheezing, lung sounds tight, expiratory wheezes bilaterally, upper and lower lobes on auscultation. Patient c/o "short of breath at rest while eating and getting up". O2 sats maintained at 95-98% on O2 at 3 lpm. Denies pain, weak, nonproductive cough at times.  Neb treatments adjusted per night MD. Patient states "i don't feel any better or any worse". Text-paged Dr. Roderic Palau to notify. Donavan Foil, RN

## 2013-11-26 NOTE — Progress Notes (Signed)
Pt given mdi with albuterol 2 puffs as directed by md.

## 2013-11-26 NOTE — Progress Notes (Signed)
Patient has converted from NSR to Afib with RVR. Ekg confirms. HR up to 150 and BP is 163/104. Patient has no c/o chest pain. Contacted hospitalist on call. Orders received. Will continue to monitor closely.

## 2013-11-27 ENCOUNTER — Other Ambulatory Visit: Payer: Self-pay | Admitting: Cardiology

## 2013-11-27 DIAGNOSIS — D72829 Elevated white blood cell count, unspecified: Secondary | ICD-10-CM

## 2013-11-27 LAB — BASIC METABOLIC PANEL
BUN: 20 mg/dL (ref 6–23)
CHLORIDE: 96 meq/L (ref 96–112)
CO2: 33 mEq/L — ABNORMAL HIGH (ref 19–32)
Calcium: 8.4 mg/dL (ref 8.4–10.5)
Creatinine, Ser: 0.53 mg/dL (ref 0.50–1.35)
GFR calc Af Amer: >90 mL/min (ref >90)
GLUCOSE: 191 mg/dL — AB (ref 70–99)
POTASSIUM: 4.5 meq/L (ref 3.7–5.3)
Sodium: 138 mEq/L (ref 137–147)

## 2013-11-27 NOTE — Progress Notes (Signed)
[facility] PROGRESS NOTE  Kenneth Merritt ZJI:[id number] DOB: September 02, 1951 DOA: 11/22/2013 PCP: Neale Burly, MD  Assessment/Plan: Acute respiratory failure: Related to COPD exacerbation secondary to smoke exposure. Patient has been slow to progress despite aggressive treatment.  continue steroids, continue bronchodilators, antibiotics. He is on inhaled bronchodilators and long acting beta agonists.  Chest xrays have not shown significant findings thus far. Ct chest did not show any infiltrates or edema. Continue current treatment and will request pulmonology input.   COPD exacerbation: See above. Likely related to exposure outdoor smoke last week. He is not on home oxygen but does have home nebulizers. Will continue Xopenex nebs, Solu-Medrol as above, Levaquin day #6. Monitor oxygen saturation level. Very slow progression.  Atrial fibrillation: likely being driven by his pulmonary issues. He is on cardizem for rate control.  TSH within the limits of normal.  He is anticoagulated on apixiban  Long term (current) use of of anticoagulants: hx of afib primarily when in hospital and a stress situation per cardiac note. On Eliquis. Will continue for now.   Chest pain: Resolved. Is likely musculoskeletal from coughing in the setting of history of muscle spasms as well. Troponins negative x3. Chart review indicates catheterization in 2010 with 70% proximal LAD and 30% proximal RCA. Overall LV function.   BRBPR (bright red blood per rectum): Patient reports 2 episodes prior to admission. Hemoglobin is stable. Patient is on a Eliquis will continue this for now. No further episodes. If change in his hemoglobin or further episodes we'll consider GI consult. Will need outpatient followup otherwise   CAD (coronary artery disease), native coronary artery: Presentation 2010 with 70% proximal LAD and 30% proximal RCA. Continue statin. Lipid panel within the limits of normal.   GERD (gastroesophageal reflux  disease) stable at baseline will continue home PPI   Leukocytosis: related to steroids. Patient is afebrile and non-toxic appearing.     Code Status: full Family Communication: none present Disposition Plan: home when ready   Consultants:  none  Procedures:  none  Antibiotics:  levaquin 11/22/13>>  HPI/Subjective: Feels a little better today.  Beginning to produce some sputum with cough  Objective: Filed Vitals:   11/27/13 1635  BP: 121/85  Pulse: 101  Temp: 99.6 F (37.6 C)  Resp: 22    Intake/Output Summary (Last 24 hours) at 11/27/13 1803 Last data filed at 11/27/13 1200  Gross per 24 hour  Intake    783 ml  Output      0 ml  Net    783 ml   Filed Weights   11/25/13 0500 11/26/13 0424 11/27/13 0422  Weight: 62.7 kg (138 lb 3.7 oz) 64.2 kg (141 lb 8.6 oz) 62.8 kg (138 lb 7.2 oz)    Exam:   General:  Breathing appears to be less labored today  Cardiovascular: RRR No LE edema no m/g/r  Respiratory: diffuse exp wheezing. Diminished air sounds bilaterally  Abdomen: +BS soft non-distended  Musculoskeletal: no clubbing or cyanosis   Data Reviewed: Basic Metabolic Panel:  Recent Labs Lab 11/22/13 0444 11/23/13 0555 11/24/13 0500 11/26/13 0647 11/26/13 1418 11/27/13 0624  NA 143 143 140 138  --  138  K 4.0 4.9 4.4 5.4* 5.3 4.5  CL 102 103 100 96  --  96  CO2 28 27 28 31   --  33*  GLUCOSE 111* 158* 156* 176*  --  191*  BUN 11 14 17 22   --  20  CREATININE 0.75 0.71 0.65 0.67  --  0.53  CALCIUM 9.6 9.5 8.9 8.8  --  8.4   Liver Function Tests:  Recent Labs Lab 11/22/13 0943  AST 27  ALT 24  ALKPHOS 49  BILITOT 0.4  PROT 7.3  ALBUMIN 3.6   No results found for this basename: LIPASE, AMYLASE,  in the last 168 hours No results found for this basename: AMMONIA,  in the last 168 hours CBC:  Recent Labs Lab 11/22/13 0444 11/23/13 0555 11/24/13 0500 11/25/13 0509 11/26/13 0647  WBC 9.4 13.7* 15.2* 16.0* 15.3*  NEUTROABS 6.9  --    --   --   --   HGB 14.4 14.4 13.9 14.1 13.6  HCT 45.4 42.6 41.7 42.5 41.4  MCV 89.5 90.8 91.0 90.8 91.4  PLT 208 211 208 217 236   Cardiac Enzymes:  Recent Labs Lab 11/22/13 0444 11/22/13 0943 11/22/13 1154  TROPONINI <0.30 <0.30 <0.30   BNP (last 3 results) No results found for this basename: PROBNP,  in the last 8760 hours CBG: No results found for this basename: GLUCAP,  in the last 168 hours  Recent Results (from the past 240 hour(s))  MRSA PCR SCREENING     Status: None   Collection Time    11/22/13  8:35 AM      Result Value Range Status   MRSA by PCR NEGATIVE  NEGATIVE Final   Comment:            The GeneXpert MRSA Assay (FDA     approved for NASAL specimens     only), is one component of a     comprehensive MRSA colonization     surveillance program. It is not     intended to diagnose MRSA     infection nor to guide or     monitor treatment for     MRSA infections.     Studies: Ct Chest W Contrast  11/26/2013   CLINICAL DATA:  Dyspnea and congestion  EXAM: CT CHEST WITH CONTRAST  TECHNIQUE: Multidetector CT imaging of the chest was performed during intravenous contrast administration.  CONTRAST:  33mL OMNIPAQUE IOHEXOL 300 MG/ML  SOLN  COMPARISON:  Portable chest x-ray of November 22, 2013  FINDINGS: The cardiac chambers are normal in size. The caliber of the thoracic aorta is normal. There is no mediastinal or hilar mass. There is a lobulated lymph node or pair of contiguous nodes which measure approximately 17 cm in long axis. The caliber of the thoracic esophagus is normal. There is no pleural nor pericardial effusion. The visualized portions of the pulmonary arterial tree appear normal.  At lung window settings extensive bullous emphysematous changes are demonstrated. There is no pulmonary parenchymal mass. There is no interstitial or alveolar infiltrate. There is apical pleural scarring bilaterally.  Within the upper abdomen the observed portions of the liver and  spleen and adrenal glands appear normal.  The thoracic vertebral bodies are preserved in height with the exception of T7 where there is mild anterior wedge compression with loss of height of approximately 15%. Superior endplate depression is demonstrated as well. There is contour deformity of the manubrium and upper body of the sternum without evidence of retrosternal hematoma. A portion of the findings may be related to motion artifact. Correlation with any symptoms over the sternum is needed. No fracture of the visualized portions of the rib cage is demonstrated.  IMPRESSION: 1. There are bullous emphysematous changes of both lungs. There is no evidence of pneumonia. No pulmonary parenchymal masses are  demonstrated. There is no bulky mediastinal or hilar lymphadenopathy. There is no pleural nor pericardial effusion. 2. There is no evidence of CHF. 3. There is mild wedge compression of T7. 4. Please see the discussion above regarding findings in the manubrium and upper sternal body.   Electronically Signed   By: David  Martinique   On: 11/26/2013 16:01    Scheduled Meds: . apixaban  5 mg Oral BID  . atorvastatin  20 mg Oral q1800  . diltiazem  90 mg Oral Q6H  . ipratropium  0.5 mg Nebulization Q4H  . levalbuterol  0.63 mg Nebulization Q4H  . levofloxacin  750 mg Intravenous Q24H  . methylPREDNISolone (SOLU-MEDROL) injection  125 mg Intravenous Q6H  . mometasone-formoterol  2 puff Inhalation BID  . pantoprazole  40 mg Oral Daily  . senna  1 tablet Oral BID  . sodium chloride  3 mL Intravenous Q12H   Continuous Infusions: . diltiazem (CARDIZEM) infusion      Principal Problem:   Respiratory distress Active Problems:   GERD (gastroesophageal reflux disease)   CAD (coronary artery disease), native coronary artery   Long term (current) use of anticoagulants   COPD exacerbation   Sinus tachycardia   Chest pain   BRBPR (bright red blood per rectum)   PAF (paroxysmal atrial fibrillation)    Leukocytosis, unspecified    Time spent: 35 minutes    [facility] Pager [phone]. If 7PM-7AM, please contact night-coverage at [email or web address], password [facility] 11/27/2013, 6:03 PM  LOS: 5 days

## 2013-11-28 ENCOUNTER — Other Ambulatory Visit: Payer: Self-pay | Admitting: Cardiology

## 2013-11-28 ENCOUNTER — Encounter (HOSPITAL_COMMUNITY): Payer: Self-pay

## 2013-11-28 DIAGNOSIS — J4489 Other specified chronic obstructive pulmonary disease: Secondary | ICD-10-CM

## 2013-11-28 DIAGNOSIS — J449 Chronic obstructive pulmonary disease, unspecified: Secondary | ICD-10-CM

## 2013-11-28 LAB — CBC
HEMATOCRIT: 43.1 % (ref 39.0–52.0)
Hemoglobin: 14.5 g/dL (ref 13.0–17.0)
MCH: 30.1 pg (ref 26.0–34.0)
MCHC: 33.6 g/dL (ref 30.0–36.0)
MCV: 89.6 fL (ref 78.0–100.0)
Platelets: 221 10*3/uL (ref 150–400)
RBC: 4.81 MIL/uL (ref 4.22–5.81)
RDW: 13.2 % (ref 11.5–15.5)
WBC: 15 10*3/uL — ABNORMAL HIGH (ref 4.0–10.5)

## 2013-11-28 LAB — BASIC METABOLIC PANEL
BUN: 20 mg/dL (ref 6–23)
CO2: 30 mEq/L (ref 19–32)
Calcium: 8.8 mg/dL (ref 8.4–10.5)
Chloride: 99 mEq/L (ref 96–112)
Creatinine, Ser: 0.55 mg/dL (ref 0.50–1.35)
GFR calc Af Amer: >90 mL/min (ref >90)
GFR calc non Af Amer: >90 mL/min (ref >90)
Glucose, Bld: 217 mg/dL — ABNORMAL HIGH (ref 70–99)
POTASSIUM: 4.4 meq/L (ref 3.7–5.3)
SODIUM: 142 meq/L (ref 137–147)

## 2013-11-28 MED ORDER — INSULIN ASPART 100 UNIT/ML ~~LOC~~ SOLN: 0.0000 [IU] | Freq: Every day | SUBCUTANEOUS | Status: DC

## 2013-11-28 MED ORDER — MAGIC MOUTHWASH W/LIDOCAINE: 10.0000 mL | Freq: Three times a day (TID) | ORAL | Status: DC

## 2013-11-28 MED ORDER — MAGIC MOUTHWASH
5.0000 mL | Freq: Three times a day (TID) | ORAL | Status: DC
  Administered 2013-11-28 – 2013-11-30 (×6): 5 mL via ORAL
  Filled 2013-11-28 (×6): qty 5

## 2013-11-28 MED ORDER — LIDOCAINE VISCOUS 2 % MT SOLN
5.0000 mL | Freq: Three times a day (TID) | OROMUCOSAL | Status: DC
  Administered 2013-11-28 – 2013-11-30 (×6): 5 mL via OROMUCOSAL
  Filled 2013-11-28 (×6): qty 15

## 2013-11-28 MED ORDER — APIXABAN 5 MG PO TABS: ORAL_TABLET | ORAL | Status: DC

## 2013-11-28 MED ORDER — INSULIN ASPART 100 UNIT/ML ~~LOC~~ SOLN
0.0000 [IU] | Freq: Three times a day (TID) | SUBCUTANEOUS | Status: DC
  Administered 2013-11-29 (×2): 3 [IU] via SUBCUTANEOUS
  Administered 2013-11-29 – 2013-11-30 (×2): 5 [IU] via SUBCUTANEOUS
  Administered 2013-11-30: 3 [IU] via SUBCUTANEOUS

## 2013-11-28 NOTE — Progress Notes (Signed)
Inpatient Diabetes Program Recommendations  AACE/ADA: New Consensus Statement on Inpatient Glycemic Control (2013)  Target Ranges:  Prepandial:   less than 140 mg/dL      Peak postprandial:   less than 180 mg/dL (1-2 hours)      Critically ill patients:  140 - 180 mg/dL   Results for Kenneth Merritt, Kenneth Merritt (MRN [id number]) as of 11/28/2013 10:22  Ref. Range 11/23/2013 05:55 11/24/2013 05:00 11/26/2013 06:47 11/27/2013 06:24 11/28/2013 05:34  Glucose Latest Range: 70-99 mg/dL 158 (H) 156 (H) 176 (H) 191 (H) 217 (H)    Inpatient Diabetes Program Recommendations Correction (SSI): Please order CBGs with Novolog correction scale ACHS while inpatient and on steroids.  Note: Patient does not have a documented history of diabetes and initial lab glucose was 111 mg/dl on 11/22/13.  Patient is ordered Solumedrol 125 mg Q6H which is likely cause of elevated glucose.  While inpatient and on steroids, please order CBGs with Novolog correction.    Thanks, Barnie Alderman, RN, MSN, CCRN Diabetes Coordinator Inpatient Diabetes Program [phone] (Team Pager) [phone] ([facility] office) [phone] [facility] office)

## 2013-11-28 NOTE — Progress Notes (Addendum)
[facility] PROGRESS NOTE  NAHSHON REICH UKG:[id number] DOB: 08/22/1951 DOA: 11/22/2013 PCP: Neale Burly, MD  Assessment/Plan: Acute respiratory failure: Related to COPD exacerbation secondary to smoke exposure. Patient has been slow to progress despite aggressive treatment.  continue steroids, continue bronchodilators. He has completed a course of antibiotics. He is on inhaled bronchodilators and long acting beta agonists.  Chest xrays have not shown significant findings thus far. Ct chest did not show any infiltrates or edema. Appreciate pulmonology assistance.   COPD exacerbation: See above. Likely related to exposure outdoor smoke last week. He is not on home oxygen but does have home nebulizers. Will continue Xopenex nebs, Solu-Medrol as above, Levaquin day #7. Will discontinue Levaquin. Monitor oxygen saturation level. Very slow progression.  Atrial fibrillation: likely being driven by his pulmonary issues. He is on cardizem for rate control.  TSH within the limits of normal.  He is anticoagulated on apixiban  Long term (current) use of of anticoagulants: hx of afib primarily when in hospital and a stress situation per cardiac note. On Eliquis. Will continue for now.   Chest pain: Resolved. Is likely musculoskeletal from coughing in the setting of history of muscle spasms as well. Troponins negative x3. Chart review indicates catheterization in 2010 with 70% proximal LAD and 30% proximal RCA. Overall LV function.   BRBPR (bright red blood per rectum): Patient reports 2 episodes prior to admission. Hemoglobin is stable. Patient is on a Eliquis will continue this for now. No further episodes. If change in his hemoglobin or further episodes we'll consider GI consult. Will need outpatient followup otherwise   CAD (coronary artery disease), native coronary artery: Presentation 2010 with 70% proximal LAD and 30% proximal RCA. Continue statin. Lipid panel within the limits of normal.   GERD  (gastroesophageal reflux disease) stable at baseline will continue home PPI   Leukocytosis: related to steroids. Patient is afebrile and non-toxic appearing.   Hyperglycemia. Related to steroids. Check CBGs and start sliding scale    Code Status: full Family Communication: none present Disposition Plan: home when ready   Consultants:  Pulmonology  Procedures:  none  Antibiotics:  levaquin 11/22/13>>  HPI/Subjective: feels about the same as yesterday. Still short of breath with cough.   Objective: Filed Vitals:   11/28/13 1659  BP: 138/82  Pulse:   Temp:   Resp:     Intake/Output Summary (Last 24 hours) at 11/28/13 2023 Last data filed at 11/28/13 1841  Gross per 24 hour  Intake    510 ml  Output      0 ml  Net    510 ml   Filed Weights   11/26/13 0424 11/27/13 0422 11/28/13 0511  Weight: 64.2 kg (141 lb 8.6 oz) 62.8 kg (138 lb 7.2 oz) 62.7 kg (138 lb 3.7 oz)    Exam:   General:   Resting comfortably on my arrival, still wheezing.   Cardiovascular: RRR No LE edema no m/g/r  Respiratory: diffuse exp wheezing. Diminished air sounds bilaterally  Abdomen: +BS soft non-distended  Musculoskeletal: no clubbing or cyanosis   Data Reviewed: Basic Metabolic Panel:  Recent Labs Lab 11/23/13 0555 11/24/13 0500 11/26/13 0647 11/26/13 1418 11/27/13 0624 11/28/13 0534  NA 143 140 138  --  138 142  K 4.9 4.4 5.4* 5.3 4.5 4.4  CL 103 100 96  --  96 99  CO2 27 28 31   --  33* 30  GLUCOSE 158* 156* 176*  --  191* 217*  BUN 14  17 22  --  20 20  CREATININE 0.71 0.65 0.67  --  0.53 0.55  CALCIUM 9.5 8.9 8.8  --  8.4 8.8   Liver Function Tests:  Recent Labs Lab 11/22/13 0943  AST 27  ALT 24  ALKPHOS 49  BILITOT 0.4  PROT 7.3  ALBUMIN 3.6   No results found for this basename: LIPASE, AMYLASE,  in the last 168 hours No results found for this basename: AMMONIA,  in the last 168 hours CBC:  Recent Labs Lab 11/22/13 0444 11/23/13 0555  11/24/13 0500 11/25/13 0509 11/26/13 0647 11/28/13 0534  WBC 9.4 13.7* 15.2* 16.0* 15.3* 15.0*  NEUTROABS 6.9  --   --   --   --   --   HGB 14.4 14.4 13.9 14.1 13.6 14.5  HCT 45.4 42.6 41.7 42.5 41.4 43.1  MCV 89.5 90.8 91.0 90.8 91.4 89.6  PLT 208 211 208 217 236 221   Cardiac Enzymes:  Recent Labs Lab 11/22/13 0444 11/22/13 0943 11/22/13 1154  TROPONINI <0.30 <0.30 <0.30   BNP (last 3 results) No results found for this basename: PROBNP,  in the last 8760 hours CBG: No results found for this basename: GLUCAP,  in the last 168 hours  Recent Results (from the past 240 hour(s))  MRSA PCR SCREENING     Status: None   Collection Time    11/22/13  8:35 AM      Result Value Range Status   MRSA by PCR NEGATIVE  NEGATIVE Final   Comment:            The GeneXpert MRSA Assay (FDA     approved for NASAL specimens     only), is one component of a     comprehensive MRSA colonization     surveillance program. It is not     intended to diagnose MRSA     infection nor to guide or     monitor treatment for     MRSA infections.     Studies: No results found.  Scheduled Meds: . apixaban  5 mg Oral BID  . atorvastatin  20 mg Oral q1800  . diltiazem  90 mg Oral Q6H  . ipratropium  0.5 mg Nebulization Q4H  . levalbuterol  0.63 mg Nebulization Q4H  . levofloxacin  750 mg Intravenous Q24H  . methylPREDNISolone (SOLU-MEDROL) injection  125 mg Intravenous Q6H  . mometasone-formoterol  2 puff Inhalation BID  . pantoprazole  40 mg Oral Daily  . senna  1 tablet Oral BID  . sodium chloride  3 mL Intravenous Q12H   Continuous Infusions: . diltiazem (CARDIZEM) infusion      Principal Problem:   Respiratory distress Active Problems:   GERD (gastroesophageal reflux disease)   CAD (coronary artery disease), native coronary artery   Long term (current) use of anticoagulants   COPD exacerbation   Sinus tachycardia   Chest pain   BRBPR (bright red blood per rectum)   PAF  (paroxysmal atrial fibrillation)   Leukocytosis, unspecified    Time spent: 35 minutes    [facility] Pager [phone]. If 7PM-7AM, please contact night-coverage at [email or web address], password [facility] 11/28/2013, 8:23 PM  LOS: 6 days
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
## 2013-11-29 DIAGNOSIS — J9600 Acute respiratory failure, unspecified whether with hypoxia or hypercapnia: Secondary | ICD-10-CM

## 2013-11-29 LAB — GLUCOSE, CAPILLARY
GLUCOSE-CAPILLARY: 173 mg/dL — AB (ref 70–99)
GLUCOSE-CAPILLARY: 203 mg/dL — AB (ref 70–99)
GLUCOSE-CAPILLARY: 163 mg/dL — AB (ref 70–99)
Glucose-Capillary: 184 mg/dL — ABNORMAL HIGH (ref 70–99)
Glucose-Capillary: 184 mg/dL — ABNORMAL HIGH (ref 70–99)

## 2013-11-29 MED ORDER — ENSURE COMPLETE PO LIQD: 237.0000 mL | Freq: Two times a day (BID) | ORAL | Status: DC
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
## 2013-11-29 NOTE — Progress Notes (Signed)
INITIAL NUTRITION ASSESSMENT  DOCUMENTATION CODES Per approved criteria  -Not Applicable   INTERVENTION: Ensure Complete po BID, each supplement provides 350 kcal and 13 grams of protein  NUTRITION DIAGNOSIS: Inadequate oral intake related to respiratory distress as evidenced by observed meal intake over past 7 days.   Goal: Pt to meet >/= 90% of their estimated nutrition needs; not met    Monitor:  Po intake, labs and wt trends   Reason for Assessment: Length of stay  63 y.o. male  Admitting Dx: Respiratory distress  ASSESSMENT: Pt admitted for respiratory distress. He is LOS day 7. His breathing is improving now and his po intake better today (75% meals). However, he likely has calorie deficit related to his acute illness and is at risk for becoming malnourished due to his severe COPD. Will initiate oral supplement and recommend he continue at least until his follow-up with MD after discharge.  Height: Ht Readings from Last 1 Encounters:  11/22/13 5' 4" (1.626 m)    Weight: Wt Readings from Last 1 Encounters:  11/29/13 131 lb 9.8 oz (59.7 kg)  11/22/13  wt 138.10# (62.9 kg)  Ideal Body Weight: 130# (59 kg)  % Ideal Body Weight: 101%  Wt Readings from Last 10 Encounters:  11/29/13 131 lb 9.8 oz (59.7 kg)  10/20/12 144 lb 9.6 oz (65.59 kg)  09/20/12 143 lb (64.864 kg)  03/02/12 145 lb (65.772 kg)  05/13/11 134 lb (60.782 kg)  08/13/10 129 lb (58.514 kg)  10/31/09 112 lb 12.8 oz (51.166 kg)  07/13/09 109 lb (49.442 kg)  06/28/09 110 lb 8 oz (50.122 kg)    Usual Body Weight: 140-145#  % Usual Body Weight: 94%  BMI:  Body mass index is 22.58 kg/(m^2).normal range  Estimated Nutritional Needs: Kcal: 1791-1980 Protein: 90 gr Fluid: 1800 ml daily  Skin: intact  Diet Order: Cardiac  EDUCATION NEEDS: -Education needs addressed   Intake/Output Summary (Last 24 hours) at 11/29/13 1544 Last data filed at 11/29/13 1321  Gross per 24 hour  Intake    480 ml   Output      0 ml  Net    480 ml    Last BM: 11/26/13  Labs:   Recent Labs Lab 11/26/13 0647 11/26/13 1418 11/27/13 0624 11/28/13 0534  NA 138  --  138 142  K 5.4* 5.3 4.5 4.4  CL 96  --  96 99  CO2 31  --  33* 30  BUN 22  --  20 20  CREATININE 0.67  --  0.53 0.55  CALCIUM 8.8  --  8.4 8.8  GLUCOSE 176*  --  191* 217*    CBG (last 3)   Recent Labs  11/28/13 2136 11/29/13 0743 11/29/13 1133  GLUCAP 184* 173* 203*    Scheduled Meds: . apixaban  5 mg Oral BID  . atorvastatin  20 mg Oral q1800  . diltiazem  90 mg Oral Q6H  . insulin aspart  0-15 Units Subcutaneous TID WC  . insulin aspart  0-5 Units Subcutaneous QHS  . ipratropium  0.5 mg Nebulization Q4H  . levalbuterol  0.63 mg Nebulization Q4H  . magic mouthwash  5 mL Oral TID   And  . lidocaine  5 mL Mouth/Throat TID  . methylPREDNISolone (SOLU-MEDROL) injection  125 mg Intravenous Q6H  . mometasone-formoterol  2 puff Inhalation BID  . pantoprazole  40 mg Oral Daily  . senna  1 tablet Oral BID  . sodium chloride  3  mL Intravenous Q12H    Continuous Infusions: . diltiazem (CARDIZEM) infusion      Past Medical History  Diagnosis Date  . GERD (gastroesophageal reflux disease)   . COPD (chronic obstructive pulmonary disease)     severe by PFTs August 2010  . 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 Atrial fibrillation     post-op a-fib. 3/11. post Aortobifem , /  Atrial fibrillation from nebulizer treatment  May, 2012, while hospitalized  . Renal artery stenosis      presumably corrected at time of aortobifem surgery March, 2011  . Tobacco user      stop smoking  . Pneumothorax      2011  before aortobifemoral surgery,  resolved  . Peripheral vascular disease, unspecified     bilateral intermittent claudication  . CAD (coronary artery disease), native coronary artery     catheterization 06/2009.Marland KitchenMarland Kitchen70% proximal  LAD and 30% proximal RCA. normal LV function, no intervention planned prior to surgery for aorta. LV normal function  by catheter 8/10.   [name] Dyslipidemia   . DJD (degenerative joint disease)   . Ejection fraction     60%, echo, May, 2012, rapid atrial fib at the time  . Cancer 2013    Prostate Radiation    Past Surgical History  Procedure Laterality Date  . Abdominal aortic aneurysm repair  01/11/2010  . Pr vein bypass graft,aorto-fem-pop    . Inguinal hernia repair      RIGHT    Colman Cater MS,RD,CSG,LDN Office: [phone] Pager: [phone]

## 2013-11-29 NOTE — Progress Notes (Signed)
Patient seen and examined. Agree with note as above.  Patient has been admitted with COPD exacerbation. He has been very slow to improve. Pulmonology is following. Today he has shown some improvement with decreased work of breathing. We will continue current treatments. Anticipate him to be in the hospital another few days.  Kassia Demarinis

## 2013-11-30 LAB — BASIC METABOLIC PANEL
BUN: 23 mg/dL (ref 6–23)
CO2: 28 meq/L (ref 19–32)
Calcium: 8.3 mg/dL — ABNORMAL LOW (ref 8.4–10.5)
Chloride: 102 mEq/L (ref 96–112)
Creatinine, Ser: 0.61 mg/dL (ref 0.50–1.35)
GFR calc Af Amer: >90 mL/min (ref >90)
GFR calc non Af Amer: >90 mL/min (ref >90)
Glucose, Bld: 199 mg/dL — ABNORMAL HIGH (ref 70–99)
Potassium: 4.3 mEq/L (ref 3.7–5.3)
SODIUM: 142 meq/L (ref 137–147)

## 2013-11-30 LAB — GLUCOSE, CAPILLARY
GLUCOSE-CAPILLARY: 194 mg/dL — AB (ref 70–99)
GLUCOSE-CAPILLARY: 212 mg/dL — AB (ref 70–99)
Glucose-Capillary: 184 mg/dL — ABNORMAL HIGH (ref 70–99)

## 2013-11-30 LAB — CBC
HCT: 42.5 % (ref 39.0–52.0)
Hemoglobin: 14.2 g/dL (ref 13.0–17.0)
MCH: 29.3 pg (ref 26.0–34.0)
MCHC: 33.4 g/dL (ref 30.0–36.0)
MCV: 87.8 fL (ref 78.0–100.0)
Platelets: 182 10*3/uL (ref 150–400)
RBC: 4.84 MIL/uL (ref 4.22–5.81)
RDW: 13.4 % (ref 11.5–15.5)
WBC: 11 10*3/uL — AB (ref 4.0–10.5)

## 2013-11-30 MED ORDER — BUDESONIDE-FORMOTEROL FUMARATE 80-4.5 MCG/ACT IN AERO
2.0000 | INHALATION_SPRAY | Freq: Two times a day (BID) | RESPIRATORY_TRACT | Status: DC
  Filled 2013-11-30: qty 6.9

## 2013-11-30 MED ORDER — MOMETASONE FURO-FORMOTEROL FUM 200-5 MCG/ACT IN AERO: 2.0000 | INHALATION_SPRAY | Freq: Two times a day (BID) | RESPIRATORY_TRACT | Status: DC

## 2013-11-30 MED ORDER — ENSURE COMPLETE PO LIQD: 237.0000 mL | Freq: Two times a day (BID) | ORAL | Status: DC

## 2013-11-30 MED ORDER — PREDNISONE 10 MG PO TABS
10.0000 mg | ORAL_TABLET | Freq: Every day | ORAL | Status: DC
Start: 2013-12-13 — End: 2014-02-20

## 2013-11-30 MED ORDER — TIZANIDINE HCL 4 MG PO TABS: 4.0000 mg | ORAL_TABLET | Freq: Two times a day (BID) | ORAL | Status: DC | PRN

## 2013-11-30 MED ORDER — LIDOCAINE VISCOUS 2 % MT SOLN: 5.0000 mL | Freq: Three times a day (TID) | OROMUCOSAL | Status: DC

## 2013-11-30 MED ORDER — BUDESONIDE-FORMOTEROL FUMARATE 80-4.5 MCG/ACT IN AERO: 2.0000 | INHALATION_SPRAY | Freq: Two times a day (BID) | RESPIRATORY_TRACT | Status: DC

## 2013-11-30 MED ORDER — DILTIAZEM HCL ER COATED BEADS 360 MG PO CP24: 360.0000 mg | ORAL_CAPSULE | Freq: Every day | ORAL | Status: DC

## 2013-11-30 MED ORDER — MAGIC MOUTHWASH: 5.0000 mL | Freq: Three times a day (TID) | ORAL | Status: DC

## 2013-11-30 MED ORDER — TIOTROPIUM BROMIDE MONOHYDRATE 18 MCG IN CAPS
18.0000 ug | ORAL_CAPSULE | Freq: Every day | RESPIRATORY_TRACT | Status: DC
  Administered 2013-11-30: 18 ug via RESPIRATORY_TRACT
  Filled 2013-11-30: qty 5

## 2013-11-30 MED ORDER — TIOTROPIUM BROMIDE MONOHYDRATE 18 MCG IN CAPS: 18.0000 ug | ORAL_CAPSULE | Freq: Every day | RESPIRATORY_TRACT | Status: DC

## 2013-11-30 MED ORDER — PREDNISONE 10 MG PO TABS: ORAL_TABLET | ORAL | Status: DC

## 2013-11-30 NOTE — Discharge Summary (Signed)
Patient seen, independently examined and chart reviewed. I agree with exam, assessment and plan discussed with Kenneth Carrel, NP.  63 year old man with history of COPD presented with respiratory distress and was admitted for treatment of COPD exacerbation. Other issues included chest pain which resolved, thought to be musculoskeletal. 2 episodes of bright red blood per rectum prior to admission with one recurrence during hospitalization.  Condition was very slow to improve and he was seen by pulmonology. He gradually improved at this point feels near baseline.   PMH COPD not on oxygen CAD afib on Eliquis Aortobifemoral bypass 2011  S: He wants to go home. Breathing is better. Still somewhat short of breath with ambulation but feels better.  Afebrile, vital signs stable. Minimal hypoxia. Appears calm and comfortable. Nontoxic. Heart sounds distant, regular. Mild increased work of breathing. Able to speak in full sentences. Fair air movement. No frank wheezes, rales or rhonchi.  Basic metabolic panel unremarkable. Leukocytosis nearly resolved. Hemoglobin normal 14.2.  In discussion with Dr. Luan Merritt, it is felt the patient stable for discharge. He does appear to be stable from a respiratory standpoint with minimal hypoxia, home oxygen will be arranged. 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 Start Spiriva, long-acting bronchodilator/steroid. Patient declines pulmonary followup at this point. His hemoglobin remains entirely stable there is no evidence of significant GI bleed. This can be followed up as an outpatient.  Kenneth Hodgkins, MD [facility] [phone]
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
## 2013-11-30 NOTE — Progress Notes (Signed)
UR chart review completed.  

## 2013-11-30 NOTE — Progress Notes (Signed)
IV removed, site WNL.  Pt given d/c instructions, new prescriptions faxed over to [facility] in [place].  Discussed all home medications (when, how, and why to take), patient verbalizes understanding. Discussed home care with patient, teachback completed.  Patients family present during d/c instructions. F/U appointment to be made by patient tomorrow as MD office is closed at this time, pt states they will make and keep appointment. Pt has portable O2 tank connected at 2lpm.  [facility] has family members number to call to deliver home concentrator. Pt is stable at this time. Pt taken to main entrance in wheelchair by staff member.

## 2013-11-30 NOTE — Discharge Summary (Signed)
Physician Discharge Summary  [name] [id number] DOB: 1951-03-13 DOA: 11/22/2013  PCP: Neale Burly, MD  Admit date: 11/22/2013 Discharge date: 11/30/2013  Time spent: 45 minutes  Recommendations for Outpatient Follow-up:  1. Dr. Sherrie Sport 1week. Follow up on respiratory status and BP control and HR control. 2. Also recommend cbc to trend Hg as pt reported BRBPR  Discharge Diagnoses:  Principal Problem:   Respiratory distress Active Problems:   GERD (gastroesophageal reflux disease)   CAD (coronary artery disease), native coronary artery   Atrial fibrillation   Long term (current) use of anticoagulants   COPD exacerbation   Sinus tachycardia   Chest pain   BRBPR (bright red blood per rectum)   PAF (paroxysmal atrial fibrillation)   Leukocytosis, unspecified   Acute respiratory failure   Discharge Condition: stable  Diet recommendation: Heart healthy  Filed Weights   11/28/13 0511 11/29/13 0454 11/30/13 0500  Weight: 62.7 kg (138 lb 3.7 oz) 59.7 kg (131 lb 9.8 oz) 58.9 kg (129 lb 13.6 oz)    History or present illness.  Kenneth Merritt is a 63 y.o. male with a past medical history that includes COPD not on home oxygen, CAD per catheterization, A. Fib, aortobifemoral bypass surgery 2011 presents in the emergency department on 11/22/13 with the chief complaint of shortness of breath. He indicated that he developed worsening shortness of breath about 3 days prior. He is not on home oxygen but he does have home nebulizers. He reported that about a week prior he was working outdoors near a [employer] and he "could not get away from the smoke". Associated symptoms included productive cough with thick white sputum and intermittent chest pain particularly with coughing. He reported the pain is similar to "muscle spasm" that he also suffers from. The pain  located bilateral anterior chest without any radiation. No palpitations diaphoresis nausea or vomiting. He denied any fever chills or  recent sick contacts. He denied any lower extremity edema but reported that he'd been sleeping propped up on 2 pillows for several years now. He reportd that he took several nebulizer treatments at home without relief. Workup in the emergency room included an arterial blood gas with a pH of 7.45 PCO2 36.4 PO2 122 bicarbonate 25. CBC and basic metabolic panel were unremarkable. Troponin was negative. Chest x-ray yielded COPD without superimposed cardiopulmonary process and EKG yielded sinus tachycardia at a rate of 151. His respiratory rate was elevated and his oxygen saturation level remained above 90%. He was given continuous nebulizer therapy with Xopenex and offered BiPAP but he refused. He was given prednisone and Levaquin was started. In addition Cardizem 10 mg intravenously was administered. At the time of my exam his heart rate was 120 respiratory rate was 18.   Hospital Course:  Acute respiratory failure: Related to COPD exacerbation secondary to smoke exposure.Admitted to [facility]. ABG on admission reassuring.   Patient was slow to progress despite aggressive treatment. Provided with  steroids,  bronchodilators. He completed a course of antibiotics. He is on inhaled bronchodilators and long acting beta agonists. Chest xrays did not shown significant findings. Ct chest did not show any infiltrates or edema but did show severe COPD. Evaluated by pulmonology who agreed with the maximum therapy provided and symbicort and dulera at discharge. Will also send home on prednisone taper down to pre-hospitalization dose of 10mg  daily.  Close follow up with PCP 1 week to evaluate respiratory status.   COPD exacerbation: See above. Likely related to exposure outdoor  smoke last week. He is not on home oxygen but does have home nebulizers. Completed 7 days Levaquin. Oxygen saturation level on room air at rest 94% but drops to 87% with ambulation on room air. Ambulation with oxygen 94%.  Atrial fibrillation: likely being  driven by his pulmonary issues. Home medications included metoprolol. This was held on admission due to bronchospasms. He was provided with  cardizem for rate control. Will continue this at discharge.  Will convert to long acting at discharge.  TSH within the limits of normal. He is anticoagulated on apixiban. Recommend close OP follow up with PCP   Long term (current) use of of anticoagulants: hx of afib primarily when in hospital and a stress situation per cardiac note. On Eliquis. Will continue for now.   Chest pain: Resolved. Is likely musculoskeletal from coughing in the setting of history of muscle spasms as well. Troponins negative x3. Chart review indicates catheterization in 2010 with 70% proximal LAD and 30% proximal RCA. Overall LV function.   BRBPR (bright red blood per rectum): Patient reports 2 episodes prior to admission. Hemoglobin stable at 13-14 range. Patient is on a Eliquis will continue this. No further episodes. Will need outpatient followup otherwise   CAD (coronary artery disease), native coronary artery: Presentation 2010 with 70% proximal LAD and 30% proximal RCA. Continue statin. Lipid panel within the limits of normal.   GERD (gastroesophageal reflux disease) Remained stable at baseline will continue home PPI   Leukocytosis: related to steroids. Trending downward at discharge.  Patient remained afebrile and non-toxic appearing.   Hyperglycemia. Related to steroids. CBG range 160-200. Was provided with SSI during hospitalization. Anticipate resolution as steroids weaned.     Procedures:  none  Consultations:  pulmonology  Discharge Exam: Filed Vitals:   11/30/13 1054  BP:   Pulse: 96  Temp:   Resp:     General: sitting on side of bed NAD Cardiovascular: RRR No MGR No LE edema Respiratory: mild increased work of breathing with conversation. BS distant but improved air flow particularly on right. No wheeze  Discharge Instructions     Medication List     STOP taking these medications       metoprolol 50 MG tablet  Commonly known as:  LOPRESSOR      TAKE these medications       albuterol (2.5 MG/3ML) 0.083% nebulizer solution  Commonly known as:  PROVENTIL  Take 2.5 mg by nebulization 3 (three) times daily as needed for wheezing or shortness of breath.     albuterol 108 (90 BASE) MCG/ACT inhaler  Commonly known as:  PROVENTIL HFA;VENTOLIN HFA  Inhale 2 puffs into the lungs every 6 (six) hours as needed for wheezing or shortness of breath.     ALPRAZolam 0.5 MG tablet  Commonly known as:  XANAX  Take 0.5 mg by mouth 3 (three) times daily as needed for anxiety.     apixaban 5 MG Tabs tablet  Commonly known as:  ELIQUIS  TAKE 1 TABLET TWICE A DAY     budesonide-formoterol 80-4.5 MCG/ACT inhaler  Commonly known as:  SYMBICORT  Inhale 2 puffs into the lungs 2 (two) times daily.     diltiazem 360 MG 24 hr capsule  Commonly known as:  CARDIZEM CD  Take 1 capsule (360 mg total) by mouth daily.     feeding supplement (ENSURE COMPLETE) Liqd  Take 237 mLs by mouth 2 (two) times daily between meals.     lidocaine 2 %  solution  Commonly known as:  XYLOCAINE  Use as directed 5 mLs in the mouth or throat 3 (three) times daily.     magic mouthwash Soln  Take 5 mLs by mouth 3 (three) times daily.     nitroGLYCERIN 0.4 MG SL tablet  Commonly known as:  NITROSTAT  Place 1 tablet (0.4 mg total) under the tongue every 5 (five) minutes as needed.     omeprazole 20 MG capsule  Commonly known as:  PRILOSEC  Take 1 capsule by mouth Daily.     predniSONE 10 MG tablet  Commonly known as:  DELTASONE  Take 6 tabs 1/22,1/23,1/24,1/25 take 4 tabs 1/26,1/27,1/28 1/29, take 2 tabs 1/30, 1/31, 2/1 and 2/2 then resume previous dose of 1 tab daily.     predniSONE 10 MG tablet  Commonly known as:  DELTASONE  Take 1 tablet (10 mg total) by mouth daily with breakfast.  Start taking on:  12/13/2013     simvastatin 40 MG tablet  Commonly known  as:  ZOCOR  TAKE 1 TABLET BY MOUTH AT BEDTIME.     tiotropium 18 MCG inhalation capsule  Commonly known as:  SPIRIVA  Place 1 capsule (18 mcg total) into inhaler and inhale daily.     tiZANidine 4 MG tablet  Commonly known as:  ZANAFLEX  Take 1 tablet (4 mg total) by mouth every 12 (twelve) hours as needed for muscle spasms.     VICODIN 5-500 MG per tablet  Generic drug:  HYDROcodone-acetaminophen  Take 1 tablet by mouth 2 (two) times daily as needed for pain.       No Known Allergies Follow-up Information   Follow up with HASANAJ,XAJE A, MD. Schedule an appointment as soon as possible for a visit in 1 week. (follow up on respiratory status and BP control)    Specialty:  Internal Medicine   Contact information:   [address] [place] [address] [phone]        The results of significant diagnostics from this hospitalization (including imaging, microbiology, ancillary and laboratory) are listed below for reference.    Significant Diagnostic Studies: Ct Chest W Contrast  11/26/2013   CLINICAL DATA:  Dyspnea and congestion  EXAM: CT CHEST WITH CONTRAST  TECHNIQUE: Multidetector CT imaging of the chest was performed during intravenous contrast administration.  CONTRAST:  50mL OMNIPAQUE IOHEXOL 300 MG/ML  SOLN  COMPARISON:  Portable chest x-ray of November 22, 2013  FINDINGS: The cardiac chambers are normal in size. The caliber of the thoracic aorta is normal. There is no mediastinal or hilar mass. There is a lobulated lymph node or pair of contiguous nodes which measure approximately 17 cm in long axis. The caliber of the thoracic esophagus is normal. There is no pleural nor pericardial effusion. The visualized portions of the pulmonary arterial tree appear normal.  At lung window settings extensive bullous emphysematous changes are demonstrated. There is no pulmonary parenchymal mass. There is no interstitial or alveolar infiltrate. There is apical pleural scarring bilaterally.   Within the upper abdomen the observed portions of the liver and spleen and adrenal glands appear normal.  The thoracic vertebral bodies are preserved in height with the exception of T7 where there is mild anterior wedge compression with loss of height of approximately 15%. Superior endplate depression is demonstrated as well. There is contour deformity of the manubrium and upper body of the sternum without evidence of retrosternal hematoma. A portion of the findings may be related to motion  artifact. Correlation with any symptoms over the sternum is needed. No fracture of the visualized portions of the rib cage is demonstrated.  IMPRESSION: 1. There are bullous emphysematous changes of both lungs. There is no evidence of pneumonia. No pulmonary parenchymal masses are demonstrated. There is no bulky mediastinal or hilar lymphadenopathy. There is no pleural nor pericardial effusion. 2. There is no evidence of CHF. 3. There is mild wedge compression of T7. 4. Please see the discussion above regarding findings in the manubrium and upper sternal body.   Electronically Signed   By: David  Martinique   On: 11/26/2013 16:01   Dg Chest Port 1 View  11/22/2013   CLINICAL DATA:  Shortness of breath, history of COPD.  EXAM: PORTABLE CHEST - 1 VIEW  COMPARISON:  Chest radiograph October 19, 2013  FINDINGS: Cardiomediastinal silhouette is unremarkable unchanged. Increased lung volumes, diffuse mild chronic interstitial changes with biapical pleural parenchymal scarring. No pleural effusions though, the costophrenic angles are incompletely imaged. No pneumothorax.  Multiple EKG lines overlie the patient and may obscure subtle underlying pathology. Soft tissue planes and included osseous structures are nonsuspicious.  IMPRESSION: COPD without superimposed acute cardiopulmonary process.   Electronically Signed   By: Elon Alas   On: 11/22/2013 05:09    Microbiology: Recent Results (from the past 240 hour(s))  MRSA PCR  SCREENING     Status: None   Collection Time    11/22/13  8:35 AM      Result Value Range Status   MRSA by PCR NEGATIVE  NEGATIVE Final   Comment:            The GeneXpert MRSA Assay (FDA     approved for NASAL specimens     only), is one component of a     comprehensive MRSA colonization     surveillance program. It is not     intended to diagnose MRSA     infection nor to guide or     monitor treatment for     MRSA infections.     Labs: Basic Metabolic Panel:  Recent Labs Lab 11/24/13 0500 11/26/13 0647 11/26/13 1418 11/27/13 0624 11/28/13 0534 11/30/13 0521  NA 140 138  --  138 142 142  K 4.4 5.4* 5.3 4.5 4.4 4.3  CL 100 96  --  96 99 102  CO2 28 31  --  33* 30 28  GLUCOSE 156* 176*  --  191* 217* 199*  BUN 17 22  --  20 20 23   CREATININE 0.65 0.67  --  0.53 0.55 0.61  CALCIUM 8.9 8.8  --  8.4 8.8 8.3*   Liver Function Tests: No results found for this basename: AST, ALT, ALKPHOS, BILITOT, PROT, ALBUMIN,  in the last 168 hours No results found for this basename: LIPASE, AMYLASE,  in the last 168 hours No results found for this basename: AMMONIA,  in the last 168 hours CBC:  Recent Labs Lab 11/24/13 0500 11/25/13 0509 11/26/13 0647 11/28/13 0534 11/30/13 0521  WBC 15.2* 16.0* 15.3* 15.0* 11.0*  HGB 13.9 14.1 13.6 14.5 14.2  HCT 41.7 42.5 41.4 43.1 42.5  MCV 91.0 90.8 91.4 89.6 87.8  PLT 208 217 236 221 182   Cardiac Enzymes: No results found for this basename: CKTOTAL, CKMB, CKMBINDEX, TROPONINI,  in the last 168 hours BNP: BNP (last 3 results) No results found for this basename: PROBNP,  in the last 8760 hours CBG:  Recent Labs Lab 11/29/13 1133 11/29/13 1638  11/29/13 2117 11/30/13 0718 11/30/13 1128  GLUCAP 203* 184* 163* 194* 212*       Signed:  [facility] 11/30/2013, 2:08 PM

## 2013-11-30 NOTE — Progress Notes (Addendum)
Patient has oxygen saturation of 94% on room air at rest. Patient oxygen saturation ambulating without 02 is 87% and with 02 at 2l 94%.

## 2013-12-01 ENCOUNTER — Telehealth: Payer: Self-pay | Admitting: Cardiology

## 2013-12-01 NOTE — Telephone Encounter (Signed)
Fayrene Helper (granddaughter) states grandfather was d/c Hospital yesterday. States that She has a question about one of his medications.

## 2013-12-01 NOTE — Telephone Encounter (Signed)
Ok to fill. Have him f/u with Dr. Ron Parker in near future.

## 2013-12-01 NOTE — Telephone Encounter (Addendum)
Reaction between simvastatin and diltiazem. [facility] didn't fill the dilt[facility]em because they are waiting on a response from MD. Please advise if okay for [facility] to fill due to possible risk of myopathy and rhabdomyolysis may be increased.

## 2013-12-01 NOTE — Telephone Encounter (Signed)
Pharmacy and patient's granddaughter informed.

## 2013-12-05 ENCOUNTER — Ambulatory Visit: Payer: Self-pay | Admitting: *Deleted

## 2013-12-05 DIAGNOSIS — I4891 Unspecified atrial fibrillation: Secondary | ICD-10-CM

## 2013-12-05 DIAGNOSIS — Z7901 Long term (current) use of anticoagulants: Secondary | ICD-10-CM

## 2013-12-08 ENCOUNTER — Telehealth: Payer: Self-pay | Admitting: *Deleted

## 2013-12-08 NOTE — Telephone Encounter (Signed)
PA sent to [facility] for eliquis

## 2013-12-12 NOTE — Telephone Encounter (Signed)
[employer][ approved eliquis through 11/09/2014 PA # [id number]  Member ID # [id number]

## 2013-12-14 ENCOUNTER — Other Ambulatory Visit: Payer: Self-pay | Admitting: Cardiology

## 2013-12-23 ENCOUNTER — Encounter: Payer: Self-pay | Admitting: Cardiology

## 2013-12-26 ENCOUNTER — Ambulatory Visit: Payer: Medicare Other | Admitting: Cardiology

## 2013-12-27 ENCOUNTER — Other Ambulatory Visit (HOSPITAL_COMMUNITY): Payer: Self-pay | Admitting: Internal Medicine

## 2013-12-28 ENCOUNTER — Other Ambulatory Visit: Payer: Self-pay | Admitting: *Deleted

## 2013-12-28 MED ORDER — APIXABAN 5 MG PO TABS: ORAL_TABLET | ORAL | Status: DC

## 2013-12-28 MED ORDER — SIMVASTATIN 40 MG PO TABS: 40.0000 mg | ORAL_TABLET | Freq: Every day | ORAL | Status: DC

## 2014-01-10 ENCOUNTER — Ambulatory Visit: Payer: Medicare Other | Admitting: Cardiology

## 2014-01-18 ENCOUNTER — Ambulatory Visit: Payer: Medicare Other | Admitting: Cardiology

## 2014-01-31 ENCOUNTER — Other Ambulatory Visit: Payer: Self-pay | Admitting: *Deleted

## 2014-01-31 MED ORDER — SIMVASTATIN 40 MG PO TABS: 40.0000 mg | ORAL_TABLET | Freq: Every day | ORAL | Status: DC

## 2014-02-08 ENCOUNTER — Encounter: Payer: Self-pay | Admitting: Cardiology

## 2014-02-10 ENCOUNTER — Encounter: Payer: Self-pay | Admitting: Cardiology

## 2014-02-13 ENCOUNTER — Telehealth: Payer: Self-pay | Admitting: Cardiology

## 2014-02-13 NOTE — Telephone Encounter (Signed)
Received refill request for pt eliquis. Pt needs to be seen by MD for refill authorization is given. Called pt and left message for pt to return call.

## 2014-02-14 ENCOUNTER — Other Ambulatory Visit: Payer: Self-pay | Admitting: *Deleted

## 2014-02-14 ENCOUNTER — Other Ambulatory Visit: Payer: Self-pay | Admitting: Cardiology

## 2014-02-14 MED ORDER — APIXABAN 5 MG PO TABS: ORAL_TABLET | ORAL | Status: DC

## 2014-02-14 MED ORDER — SIMVASTATIN 40 MG PO TABS: 40.0000 mg | ORAL_TABLET | Freq: Every day | ORAL | Status: DC

## 2014-02-16 ENCOUNTER — Other Ambulatory Visit: Payer: Self-pay | Admitting: *Deleted

## 2014-02-16 MED ORDER — APIXABAN 5 MG PO TABS: ORAL_TABLET | ORAL | Status: DC

## 2014-02-20 ENCOUNTER — Emergency Department (HOSPITAL_COMMUNITY): Payer: Medicare Other

## 2014-02-20 ENCOUNTER — Emergency Department (HOSPITAL_COMMUNITY)
Admission: EM | Admit: 2014-02-20 | Discharge: 2014-02-20 | Disposition: A | Discharge status: Home / Self Care | Payer: Medicare Other | Attending: Emergency Medicine | Admitting: Emergency Medicine

## 2014-02-20 DIAGNOSIS — E785 Hyperlipidemia, unspecified: Secondary | ICD-10-CM | POA: Insufficient documentation

## 2014-02-20 DIAGNOSIS — Z8546 Personal history of malignant neoplasm of prostate: Secondary | ICD-10-CM | POA: Insufficient documentation

## 2014-02-20 DIAGNOSIS — B029 Zoster without complications: Secondary | ICD-10-CM | POA: Insufficient documentation

## 2014-02-20 DIAGNOSIS — Z79899 Other long term (current) drug therapy: Secondary | ICD-10-CM | POA: Insufficient documentation

## 2014-02-20 DIAGNOSIS — R110 Nausea: Secondary | ICD-10-CM | POA: Insufficient documentation

## 2014-02-20 DIAGNOSIS — Z87448 Personal history of other diseases of urinary system: Secondary | ICD-10-CM | POA: Insufficient documentation

## 2014-02-20 DIAGNOSIS — IMO0002 Reserved for concepts with insufficient information to code with codable children: Secondary | ICD-10-CM | POA: Insufficient documentation

## 2014-02-20 DIAGNOSIS — Z923 Personal history of irradiation: Secondary | ICD-10-CM | POA: Insufficient documentation

## 2014-02-20 DIAGNOSIS — J441 Chronic obstructive pulmonary disease with (acute) exacerbation: Secondary | ICD-10-CM | POA: Insufficient documentation

## 2014-02-20 DIAGNOSIS — Z87891 Personal history of nicotine dependence: Secondary | ICD-10-CM | POA: Insufficient documentation

## 2014-02-20 DIAGNOSIS — M1990 Unspecified osteoarthritis, unspecified site: Secondary | ICD-10-CM | POA: Insufficient documentation

## 2014-02-20 DIAGNOSIS — K219 Gastro-esophageal reflux disease without esophagitis: Secondary | ICD-10-CM | POA: Insufficient documentation

## 2014-02-20 DIAGNOSIS — Z7901 Long term (current) use of anticoagulants: Secondary | ICD-10-CM | POA: Insufficient documentation

## 2014-02-20 DIAGNOSIS — I2510 Atherosclerotic heart disease of native coronary artery without angina pectoris: Secondary | ICD-10-CM | POA: Insufficient documentation

## 2014-02-20 DIAGNOSIS — I4891 Unspecified atrial fibrillation: Secondary | ICD-10-CM | POA: Insufficient documentation

## 2014-02-20 LAB — COMPREHENSIVE METABOLIC PANEL
ALK PHOS: 47 U/L (ref 39–117)
ALT: 15 U/L (ref 0–53)
AST: 19 U/L (ref 0–37)
Albumin: 3.8 g/dL (ref 3.5–5.2)
BUN: 10 mg/dL (ref 6–23)
CO2: 29 meq/L (ref 19–32)
Calcium: 10 mg/dL (ref 8.4–10.5)
Chloride: 96 mEq/L (ref 96–112)
Creatinine, Ser: 0.54 mg/dL (ref 0.50–1.35)
GFR calc non Af Amer: >90 mL/min (ref >90)
Glucose, Bld: 93 mg/dL (ref 70–99)
POTASSIUM: 4.3 meq/L (ref 3.7–5.3)
SODIUM: 137 meq/L (ref 137–147)
TOTAL PROTEIN: 7.6 g/dL (ref 6.0–8.3)
Total Bilirubin: 1.8 mg/dL — ABNORMAL HIGH (ref 0.3–1.2)

## 2014-02-20 LAB — CBC WITH DIFFERENTIAL/PLATELET
Basophils Absolute: 0 10*3/uL (ref 0.0–0.1)
Basophils Relative: 0 % (ref 0–1)
Eosinophils Absolute: 0 10*3/uL (ref 0.0–0.7)
Eosinophils Relative: 0 % (ref 0–5)
HEMATOCRIT: 45.3 % (ref 39.0–52.0)
HEMOGLOBIN: 14.7 g/dL (ref 13.0–17.0)
LYMPHS ABS: 1.9 10*3/uL (ref 0.7–4.0)
LYMPHS PCT: 16 % (ref 12–46)
MCH: 28.2 pg (ref 26.0–34.0)
MCHC: 32.5 g/dL (ref 30.0–36.0)
MCV: 86.9 fL (ref 78.0–100.0)
MONO ABS: 1.4 10*3/uL — AB (ref 0.1–1.0)
MONOS PCT: 11 % (ref 3–12)
NEUTROS ABS: 8.9 10*3/uL — AB (ref 1.7–7.7)
Neutrophils Relative %: 73 % (ref 43–77)
Platelets: 259 10*3/uL (ref 150–400)
RBC: 5.21 MIL/uL (ref 4.22–5.81)
RDW: 15.2 % (ref 11.5–15.5)
WBC: 12.2 10*3/uL — AB (ref 4.0–10.5)

## 2014-02-20 LAB — LIPASE, BLOOD: Lipase: 42 U/L (ref 11–59)

## 2014-02-20 LAB — TROPONIN I: Troponin I: <0.3 ng/mL (ref <0.30)

## 2014-02-20 MED ORDER — ONDANSETRON HCL 4 MG/2ML IJ SOLN
4.0000 mg | Freq: Once | INTRAMUSCULAR | Status: AC
Start: 2014-02-20 — End: 2014-02-20
  Administered 2014-02-20: 4 mg via INTRAVENOUS
  Filled 2014-02-20: qty 2

## 2014-02-20 MED ORDER — MORPHINE SULFATE 4 MG/ML IJ SOLN
4.0000 mg | INTRAMUSCULAR | Status: DC | PRN
  Administered 2014-02-20: 4 mg via INTRAVENOUS
  Filled 2014-02-20: qty 1

## 2014-02-20 MED ORDER — GABAPENTIN 300 MG PO CAPS: 300.0000 mg | ORAL_CAPSULE | Freq: Every day | ORAL | Status: DC

## 2014-02-20 MED ORDER — ONDANSETRON 4 MG PO TBDP: 4.0000 mg | ORAL_TABLET | Freq: Three times a day (TID) | ORAL | Status: DC | PRN

## 2014-02-20 MED ORDER — FAMOTIDINE IN NACL 20-0.9 MG/50ML-% IV SOLN
20.0000 mg | Freq: Once | INTRAVENOUS | Status: AC
  Administered 2014-02-20: 20 mg via INTRAVENOUS
  Filled 2014-02-20: qty 50

## 2014-02-20 MED ORDER — OXYCODONE-ACETAMINOPHEN 5-325 MG PO TABS: 2.0000 | ORAL_TABLET | ORAL | Status: DC | PRN

## 2014-02-20 NOTE — Discharge Instructions (Signed)
Nausea, Adult Nausea is the feeling that you have an upset stomach or have to vomit. Nausea by itself is not likely a serious concern, but it may be an early sign of more serious medical problems. As nausea gets worse, it can lead to vomiting. If vomiting develops, there is the risk of dehydration.  CAUSES   Viral infections.  Food poisoning.  Medicines.  Pregnancy.  Motion sickness.  Migraine headaches.  Emotional distress.  Severe pain from any source.  Alcohol intoxication. HOME CARE INSTRUCTIONS  Get plenty of rest.  Ask your caregiver about specific rehydration instructions.  Eat small amounts of food and sip liquids more often.  Take all medicines as told by your caregiver. SEEK MEDICAL CARE IF:  You have not improved after 2 days, or you get worse.  You have a headache. SEEK IMMEDIATE MEDICAL CARE IF:   You have a fever.  You faint.  You keep vomiting or have blood in your vomit.  You are extremely weak or dehydrated.  You have dark or bloody stools.  You have severe chest or abdominal pain. MAKE SURE YOU:  Understand these instructions.  Will watch your condition.  Will get help right away if you are not doing well or get worse. Document Released: 12/04/2004 Document Revised: 07/21/2012 Document Reviewed: 07/09/2011 [facility] Patient Information 2014 [place], [place].  Shingles Shingles (herpes zoster) is an infection that is caused by the same virus that causes chickenpox (varicella). The infection causes a painful skin rash and fluid-filled blisters, which eventually break open, crust over, and heal. It may occur in any area of the body, but it usually affects only one side of the body or face. The pain of shingles usually lasts about 1 month. However, some people with shingles may develop long-term (chronic) pain in the affected area of the body. Shingles often occurs many years after the person had chickenpox. It is more common:  In people older  than 50 years.  In people with weakened immune systems, such as those with HIV, AIDS, or cancer.  In people taking medicines that weaken the immune system, such as transplant medicines.  In people under great stress. CAUSES  Shingles is caused by the varicella zoster virus (VZV), which also causes chickenpox. After a person is infected with the virus, it can remain in the person's body for years in an inactive state (dormant). To cause shingles, the virus reactivates and breaks out as an infection in a nerve root. The virus can be spread from person to person (contagious) through contact with open blisters of the shingles rash. It will only spread to people who have not had chickenpox. When these people are exposed to the virus, they may develop chickenpox. They will not develop shingles. Once the blisters scab over, the person is no longer contagious and cannot spread the virus to others. SYMPTOMS  Shingles shows up in stages. The initial symptoms may be pain, itching, and tingling in an area of the skin. This pain is usually described as burning, stabbing, or throbbing.In a few days or weeks, a painful red rash will appear in the area where the pain, itching, and tingling were felt. The rash is usually on one side of the body in a band or belt-like pattern. Then, the rash usually turns into fluid-filled blisters. They will scab over and dry up in approximately 2 3 weeks. Flu-like symptoms may also occur with the initial symptoms, the rash, or the blisters. These may include:  Fever.  Chills.  Headache.  Upset stomach. DIAGNOSIS  Your caregiver will perform a skin exam to diagnose shingles. Skin scrapings or fluid samples may also be taken from the blisters. This sample will be examined under a microscope or sent to a lab for further testing. TREATMENT  There is no specific cure for shingles. Your caregiver will likely prescribe medicines to help you manage the pain, recover faster, and  avoid long-term problems. This may include antiviral drugs, anti-inflammatory drugs, and pain medicines. HOME CARE INSTRUCTIONS   Take a cool bath or apply cool compresses to the area of the rash or blisters as directed. This may help with the pain and itching.   Only take over-the-counter or prescription medicines as directed by your caregiver.   Rest as directed by your caregiver.  Keep your rash and blisters clean with mild soap and cool water or as directed by your caregiver.  Do not pick your blisters or scratch your rash. Apply an anti-itch cream or numbing creams to the affected area as directed by your caregiver.  Keep your shingles rash covered with a loose bandage (dressing).  Avoid skin contact with:  Babies.   Pregnant women.   Children with eczema.   Elderly people with transplants.   People with chronic illnesses, such as leukemia or AIDS.   Wear loose-fitting clothing to help ease the pain of material rubbing against the rash.  Keep all follow-up appointments with your caregiver.If the area involved is on your face, you may receive a referral for follow-up to a specialist, such as an eye doctor (ophthalmologist) or an ear, nose, and throat (ENT) doctor. Keeping all follow-up appointments will help you avoid eye complications, chronic pain, or disability.  SEEK IMMEDIATE MEDICAL CARE IF:   You have facial pain, pain around the eye area, or loss of feeling on one side of your face.  You have ear pain or ringing in your ear.  You have loss of taste.  Your pain is not relieved with prescribed medicines.   Your redness or swelling spreads.   You have more pain and swelling.  Your condition is worsening or has changed.   You have a feveror persistent symptoms for more than 2 3 days.  You have a fever and your symptoms suddenly get worse. MAKE SURE YOU:  Understand these instructions.  Will watch your condition.  Will get help right away  if you are not doing well or get worse. Document Released: 10/27/2005 Document Revised: 07/21/2012 Document Reviewed: 06/10/2012 [facility] Patient Information 2014 [place].

## 2014-02-20 NOTE — ED Notes (Signed)
Chest pain midsternal radiating to right side.  Has shingles in same area.   Has some sob which is his baseline with copd.  C/o nausea.

## 2014-02-20 NOTE — ED Provider Notes (Signed)
CSN: [id number]     Arrival date & time 02/20/14  1741 History  This chart was scribed for Tanna Furry, MD by Jenne Campus, ED Scribe. This patient was seen in room APA05/APA05 and the patient's care was started at 7:06 PM.   Chief Complaint  Patient presents with  . Chest Pain  . Herpes Zoster     The history is provided by the patient. No language interpreter was used.    HPI Comments: Kenneth Merritt is a 63 y.o. male who presents to the Emergency Department complaining of mid sternal CP that radiates to the left flank and epigastric pain described as tightness with an associated rash and nausea for the past 6 days that became worse. He states that he received a shingles vaccine and then developed the rash a few days afterwards. Eating has started to make the pain worse starting today. He states that he was seen by [facility] during the initial outbreak and was started on an antiviral with mild improvement. He states that he has been taking Tylenol and Vicodin  for the pain but denies excessive use. He denies that either has improved his symptoms. He denies any new cough, fevers, chills, emesis or new SOB. He has a h/o COPD and emphysema and denies any recent exacerbations of either. He reports 2 prior MIs but denies any angiograms or stent placement. He denies smoking or alcohol use.    Past Medical History  Diagnosis Date  . GERD (gastroesophageal reflux disease)   . COPD (chronic obstructive pulmonary disease)     severe by PFTs August 2010  . 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 Atrial fibrillation     post-op a-fib. 3/11. post Aortobifem , /  Atrial fibrillation from nebulizer treatment  May, 2012, while hospitalized  . Renal artery stenosis      presumably corrected at time of aortobifem surgery March, 2011  . Tobacco user      stop smoking  . Pneumothorax      2011  before aortobifemoral  surgery,  resolved  . Peripheral vascular disease, unspecified     bilateral intermittent claudication  . CAD (coronary artery disease), native coronary artery     catheterization 06/2009.Marland KitchenMarland Kitchen70% proximal LAD and 30% proximal RCA. normal LV function, no intervention planned prior to surgery for aorta. LV normal function  by catheter 8/10.   [name] Dyslipidemia   . DJD (degenerative joint disease)   . Ejection fraction     60%, echo, May, 2012, rapid atrial fib at the time  . Cancer 2013    Prostate Radiation   Past Surgical History  Procedure Laterality Date  . Abdominal aortic aneurysm repair  01/11/2010  . Pr vein bypass graft,aorto-fem-pop    . Inguinal hernia repair      RIGHT   Family History  Problem Relation Age of Onset  . Cancer Father     prostate   History  Substance Use Topics  . Smoking status: Former Smoker -- 2.00 packs/day for 40 years    Types: Cigarettes    Quit date: 01/11/2010  . Smokeless tobacco: Never Used  . Alcohol Use: No    Review of Systems  Constitutional: Negative for fever, chills, diaphoresis, appetite change and fatigue.  HENT: Negative for mouth sores, sore throat and trouble swallowing.   Eyes: Negative for visual disturbance.  Respiratory:  Negative for cough, chest tightness, shortness of breath and wheezing.   Cardiovascular: Positive for chest pain.  Gastrointestinal: Positive for nausea. Negative for vomiting, abdominal pain, diarrhea and abdominal distention.  Endocrine: Negative for polydipsia, polyphagia and polyuria.  Genitourinary: Negative for dysuria, frequency and hematuria.  Musculoskeletal: Negative for gait problem.  Skin: Positive for rash. Negative for color change and pallor.  Neurological: Negative for dizziness, syncope, light-headedness and headaches.  Hematological: Does not bruise/bleed easily.  Psychiatric/Behavioral: Negative for behavioral problems and confusion.      Allergies  Review of patient's allergies  indicates no known allergies.  Home Medications   Current Outpatient Rx  Name  Route  Sig  Dispense  Refill  . acyclovir (ZOVIRAX) 800 MG tablet   Oral   Take 800 mg by mouth 5 (five) times daily. 7 day course starting on 02/12/2014 for SHINGLES         . albuterol (PROVENTIL HFA;VENTOLIN HFA) 108 (90 BASE) MCG/ACT inhaler   Inhalation   Inhale 2 puffs into the lungs every 6 (six) hours as needed for wheezing or shortness of breath.         [name] albuterol (PROVENTIL) (2.5 MG/3ML) 0.083% nebulizer solution   Nebulization   Take 2.5 mg by nebulization 3 (three) times daily as needed for wheezing or shortness of breath.         . ALPRAZolam (XANAX) 0.5 MG tablet   Oral   Take 0.5 mg by mouth 2 (two) times daily.          [name] apixaban (ELIQUIS) 5 MG TABS tablet   Oral   Take 5 mg by mouth 2 (two) times daily.         [name] gabapentin (NEURONTIN) 300 MG capsule   Oral   Take 300 mg by mouth 3 (three) times daily.         . metoprolol (LOPRESSOR) 50 MG tablet   Oral   Take 50 mg by mouth 2 (two) times daily.         . nitroGLYCERIN (NITROSTAT) 0.4 MG SL tablet   Sublingual   Place 1 tablet (0.4 mg total) under the tongue every 5 (five) minutes as needed.   25 tablet   3   . omeprazole (PRILOSEC) 20 MG capsule   Oral   Take 1 capsule by mouth every morning.          [name] oxyCODONE-acetaminophen (PERCOCET) 10-325 MG per tablet   Oral   Take 1 tablet by mouth every 4 (four) hours as needed. For pain         . predniSONE (DELTASONE) 10 MG tablet   Oral   Take 10 mg by mouth every evening.         . simvastatin (ZOCOR) 40 MG tablet   Oral   Take 40 mg by mouth every evening.         . gabapentin (NEURONTIN) 300 MG capsule   Oral   Take 1 capsule (300 mg total) by mouth at bedtime.   30 capsule   0   . isosorbide mononitrate (IMDUR) 30 MG 24 hr tablet   Oral   Take 30 mg by mouth daily.         . ondansetron (ZOFRAN ODT) 4 MG disintegrating tablet    Oral   Take 1 tablet (4 mg total) by mouth every 8 (eight) hours as needed for nausea.   20 tablet   0   . oxyCODONE-acetaminophen (PERCOCET/ROXICET) 5-325  MG per tablet   Oral   Take 2 tablets by mouth every 4 (four) hours as needed.   6 tablet   0   . QVAR 40 MCG/ACT inhaler   Inhalation   Inhale 1 puff into the lungs 2 (two) times daily.          Triage Vitals: BP 138/95  Pulse 95  Temp(Src) 97.6 F (36.4 C) (Oral)  Resp 18  Ht 5\' 4"  (1.626 m)  Wt 137 lb (62.143 kg)  BMI 23.50 kg/m2  SpO2 96%  Physical Exam  Nursing note and vitals reviewed. Constitutional: He is oriented to person, place, and time. He appears well-developed and well-nourished. No distress.  HENT:  Head: Normocephalic and atraumatic.  Eyes: Conjunctivae are normal. Pupils are equal, round, and reactive to light. No scleral icterus.  Neck: Normal range of motion. Neck supple. No thyromegaly present.  Cardiovascular: Normal rate and regular rhythm.  Exam reveals no gallop and no friction rub.   No murmur heard. Pulmonary/Chest: Effort normal. No respiratory distress. He has wheezes (bilaterally ). He has no rales. He exhibits tenderness.  Erythematous blisters with ruptured boli from sternal chest to posterior left ribs  Abdominal: Soft. Bowel sounds are normal. He exhibits no distension. There is no tenderness. There is no rebound.  Musculoskeletal: Normal range of motion.  Neurological: He is alert and oriented to person, place, and time.  Skin: Skin is warm and dry. Rash (see chest) noted.  Psychiatric: He has a normal mood and affect. His behavior is normal.    ED Course  Procedures (including critical care time)  Medications  morphine 4 MG/ML injection 4 mg (4 mg Intravenous Given 02/20/14 1935)  famotidine (PEPCID) IVPB 20 mg (0 mg Intravenous Stopped 02/20/14 2045)  ondansetron (ZOFRAN) injection 4 mg (4 mg Intravenous Given 02/20/14 1935)    DIAGNOSTIC STUDIES: Oxygen Saturation is 96% on  RA, adequate by my interpretation.    COORDINATION OF CARE: 7:13 PM-Discussed treatment plan which includes medications, CXR, CBC panel, BMP and troponin with pt at bedside and pt agreed to plan.   Labs Review Labs Reviewed  CBC WITH DIFFERENTIAL - Abnormal; Notable for the following:    WBC 12.2 (*)    Neutro Abs 8.9 (*)    Monocytes Absolute 1.4 (*)    All other components within normal limits  COMPREHENSIVE METABOLIC PANEL - Abnormal; Notable for the following:    Total Bilirubin 1.8 (*)    All other components within normal limits  TROPONIN I  LIPASE, BLOOD   Imaging Review Dg Chest 2 View  02/20/2014   CLINICAL DATA:  Chest pain and shortness of breath.  EXAM: CHEST - 2 VIEW  COMPARISON:  DG CHEST 1V PORT dated 02/10/2014; DG CHEST 2V dated 02/08/2014; DG CHEST 1V PORT dated 01/18/2014; CT CHEST W/CM dated 11/26/2013  FINDINGS: The heart size and mediastinal contours are within normal limits. Stable severe emphysematous lung disease. There is no evidence of pulmonary edema, consolidation, pneumothorax, nodule or pleural fluid. Stable mild compression of the T8 vertebral body.  IMPRESSION: Stable severe emphysematous lung disease.  No acute findings.   Electronically Signed   By: Aletta Edouard M.D.   On: 02/20/2014 18:10     EKG Interpretation   Date/Time:  Monday February 20 2014 17:45:17 EDT Ventricular Rate:  94 PR Interval:  110 QRS Duration: 84 QT Interval:  366 QTC Calculation: 457 R Axis:   33 Text Interpretation:  Sinus rhythm with short PR  Otherwise normal ECG When  compared with ECG of 25-Nov-2013 23:34, Sinus rhythm has replaced Atrial  fibrillation Vent. rate has decreased BY  56 BPM      MDM   Final diagnoses:  Herpes zoster  Nausea   On recheck his symptoms are improved. Think his pain is clearly from his herpes zoster. His nausea is very likely from his acyclovir. And given him instructions to take it with food. Prescription for Zofran. Also prescription for  gabapentin for his pain until improving. Also Percocet for his acute symptoms. Primary care followup. No sign of ACS. Normal hepatobiliary and pancratic or enzymes with exception of a slight elevation of his bilirubin at 1.8. His abdomen is benign to exam and the outside of his distribution of shingles.  I personally performed the services described in this documentation, which was scribed in my presence. The recorded information has been reviewed and is accurate.      Tanna Furry, MD 02/20/14 2127

## 2014-03-06 ENCOUNTER — Telehealth: Payer: Self-pay | Admitting: *Deleted

## 2014-03-06 MED ORDER — SIMVASTATIN 40 MG PO TABS: 40.0000 mg | ORAL_TABLET | Freq: Every evening | ORAL | Status: DC

## 2014-03-06 NOTE — Telephone Encounter (Signed)
REFILL REQUEST RECEIVED

## 2014-04-13 ENCOUNTER — Encounter: Payer: Self-pay | Admitting: Cardiology

## 2014-04-13 DIAGNOSIS — Z9229 Personal history of other drug therapy: Secondary | ICD-10-CM | POA: Insufficient documentation

## 2014-04-14 ENCOUNTER — Ambulatory Visit (INDEPENDENT_AMBULATORY_CARE_PROVIDER_SITE_OTHER): Payer: Medicare Other | Admitting: Cardiology

## 2014-04-14 ENCOUNTER — Encounter: Payer: Self-pay | Admitting: Cardiology

## 2014-04-14 VITALS — BP 115/79 | HR 81 | Ht 64.0 in | Wt 113.0 lb

## 2014-04-14 DIAGNOSIS — Z95828 Presence of other vascular implants and grafts: Secondary | ICD-10-CM

## 2014-04-14 DIAGNOSIS — Z7952 Long term (current) use of systemic steroids: Secondary | ICD-10-CM

## 2014-04-14 DIAGNOSIS — I4891 Unspecified atrial fibrillation: Secondary | ICD-10-CM

## 2014-04-14 DIAGNOSIS — J449 Chronic obstructive pulmonary disease, unspecified: Secondary | ICD-10-CM

## 2014-04-14 DIAGNOSIS — E785 Hyperlipidemia, unspecified: Secondary | ICD-10-CM

## 2014-04-14 DIAGNOSIS — I480 Paroxysmal atrial fibrillation: Secondary | ICD-10-CM

## 2014-04-14 DIAGNOSIS — IMO0002 Reserved for concepts with insufficient information to code with codable children: Secondary | ICD-10-CM

## 2014-04-14 DIAGNOSIS — I2510 Atherosclerotic heart disease of native coronary artery without angina pectoris: Secondary | ICD-10-CM

## 2014-04-14 DIAGNOSIS — Z9889 Other specified postprocedural states: Secondary | ICD-10-CM

## 2014-04-14 DIAGNOSIS — Z9229 Personal history of other drug therapy: Secondary | ICD-10-CM

## 2014-04-14 DIAGNOSIS — Z7901 Long term (current) use of anticoagulants: Secondary | ICD-10-CM

## 2014-04-14 NOTE — Patient Instructions (Signed)
Continue all current medications. Your physician wants you to follow up in:  1 year.  You will receive a reminder letter in the mail one-two months in advance.  If you don't receive a letter, please call our office to schedule the follow up appointment   

## 2014-04-14 NOTE — Assessment & Plan Note (Signed)
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 He had surgery for this. I am recommending that he followup with the vascular surgeons.

## 2014-04-14 NOTE — Progress Notes (Signed)
Patient ID: Kenneth Merritt, male   DOB: 1950/11/28, 63 y.o.   MRN: [id number]    HPI  Patient is seen today to followup atrial fibrillation and coronary disease. I saw him last November, 2013. His catheterization was done last in August, 2010. He had good left ventricular function. No intervention was needed. He went on at that time to have vascular surgery for his totally occluded aorta. Atrial fibrillation was noted at that time. He was placed on Coumadin also. More recently in the hospital in January, 2015, he had a COPD exacerbation. He is now on Eliquis instead of Coumadin. In addition he had a recent episode of shingles. He has a residual mild rash in the dermatome that comes below his right nipple. He's not having any significant chest pain. He does not feel any significant palpitations.  As part of today's evaluation I have reviewed my prior records. I have also reviewed the hospital records for his January , 2015 admission. I've also followup Dopplers done by vascular surgery.  No Known Allergies  Current Outpatient Prescriptions  Medication Sig Dispense Refill  . albuterol (PROVENTIL HFA;VENTOLIN HFA) 108 (90 BASE) MCG/ACT inhaler Inhale 2 puffs into the lungs every 6 (six) hours as needed for wheezing or shortness of breath.      [name] albuterol (PROVENTIL) (2.5 MG/3ML) 0.083% nebulizer solution Take 2.5 mg by nebulization 3 (three) times daily as needed for wheezing or shortness of breath.      . ALPRAZolam (XANAX) 0.5 MG tablet Take 0.5 mg by mouth 2 (two) times daily.       [name] apixaban (ELIQUIS) 5 MG TABS tablet Take 5 mg by mouth 2 (two) times daily.      [name] gabapentin (NEURONTIN) 300 MG capsule Take 300 mg by mouth 3 (three) times daily.      [name] HYDROcodone-acetaminophen (NORCO/VICODIN) 5-325 MG per tablet Take 1 tablet by mouth every 6 (six) hours as needed for moderate pain.      . metoprolol (LOPRESSOR) 50 MG tablet Take 50 mg by mouth 2 (two) times daily.      . nitroGLYCERIN (NITROSTAT)  0.4 MG SL tablet Place 1 tablet (0.4 mg total) under the tongue every 5 (five) minutes as needed.  25 tablet  3  . omeprazole (PRILOSEC) 20 MG capsule Take 1 capsule by mouth every morning.       . predniSONE (DELTASONE) 10 MG tablet Take 10 mg by mouth every evening.      . simvastatin (ZOCOR) 40 MG tablet Take 1 tablet (40 mg total) by mouth every evening.  30 tablet  2   No current facility-administered medications for this visit.    History   Social History  . Marital Status: Married    Spouse Name: N/A    Number of Children: N/A  . Years of Education: N/A   Occupational History  . Not on file.   Social History Main Topics  . Smoking status: Former Smoker -- 2.00 packs/day for 40 years    Types: Cigarettes    Quit date: 01/11/2010  . Smokeless tobacco: Never Used  . Alcohol Use: No  . Drug Use: Not on file  . Sexual Activity: Not on file   Other Topics Concern  . Not on file   Social History Narrative   Married, retired.     Family History  Problem Relation Age of Onset  . Cancer Father     prostate    Past Medical History  Diagnosis  Date  . GERD (gastroesophageal reflux disease)   . COPD (chronic obstructive pulmonary disease)     severe by PFTs August 2010  . 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 Atrial fibrillation     post-op a-fib. 3/11. post Aortobifem , /  Atrial fibrillation from nebulizer treatment  May, 2012, while hospitalized  . Renal artery stenosis      presumably corrected at time of aortobifem surgery March, 2011  . Tobacco user      stop smoking  . Pneumothorax      2011  before aortobifemoral surgery,  resolved  . Peripheral vascular disease, unspecified     bilateral intermittent claudication  . CAD (coronary artery disease), native coronary artery     catheterization 06/2009.Marland KitchenMarland Kitchen70% proximal LAD and 30% proximal RCA. normal LV function, no  intervention planned prior to surgery for aorta. LV normal function  by catheter 8/10.   [name] Dyslipidemia   . DJD (degenerative joint disease)   . Ejection fraction     60%, echo, May, 2012, rapid atrial fib at the time  . Cancer 2013    Prostate Radiation    Past Surgical History  Procedure Laterality Date  . Abdominal aortic aneurysm repair  01/11/2010  . Pr vein bypass graft,aorto-fem-pop    . Inguinal hernia repair      RIGHT    Patient Active Problem List   Diagnosis Date Noted  . HX: anticoagulation 04/13/2014  . Acute respiratory failure 11/29/2013  . Leukocytosis, unspecified 11/24/2013  . Respiratory distress 11/22/2013  . COPD exacerbation 11/22/2013  . Sinus tachycardia 11/22/2013  . Chest pain 11/22/2013  . BRBPR (bright red blood per rectum) 11/22/2013  . PAF (paroxysmal atrial fibrillation) 11/22/2013  . GERD (gastroesophageal reflux disease)   . COPD (chronic obstructive pulmonary disease)   . Pneumothorax   . Peripheral vascular disease, unspecified   . Dyslipidemia   . DJD (degenerative joint disease)   . S/P aortobifemoral bypass surgery   . Renal artery stenosis   . Tobacco user   . CAD (coronary artery disease), native coronary artery   . Ejection fraction     ROS   Patient denies fever, chills, headache, rash, sweats, change in vision, change in hearing, chest pain, nausea vomiting, urinary symptoms. All other systems are reviewed and are negative.  PHYSICAL EXAM  Patient is thin. He is oriented to person time and place. Affect is normal. Head is atraumatic. Sclera and conjunctiva are normal. Lung exam reveals some expiratory wheezes. He has diffuse rhonchi. Cardiac exam reveals S1 and S2. The rhythm is regular. Abdomen is soft. There is no peripheral edema. There are no musculoskeletal deformities. He has a mild residual rash in the dermatome that comes from his back and goes below his right nipple.  Filed Vitals:   04/14/14 1007  BP: 115/79    Pulse: 81  Height: 5\' 4"  (1.626 m)  Weight: 113 lb (51.256 kg)    ASSESSMENT & PLAN

## 2014-04-14 NOTE — Assessment & Plan Note (Signed)
Coronary disease is stable at this time. No further testing is needed.

## 2014-04-14 NOTE — Assessment & Plan Note (Signed)
He is receiving a statin for his coronary disease.

## 2014-04-14 NOTE — Assessment & Plan Note (Signed)
The patient has significant lung disease that is being managed by his primary team.

## 2014-04-14 NOTE — Assessment & Plan Note (Signed)
His rhythm seems stable at this time. He is anticoagulated. No further workup.

## 2014-04-14 NOTE — Assessment & Plan Note (Signed)
The patient is anticoagulated. We need to be sure that he is having followup labs on a six-month basis.  As part of today's evaluation I spent greater than 25 minutes with is total care. More than half of this time was with direct contact with him. We have reviewed all of the issues that I have outlined in my notes.

## 2014-04-21 ENCOUNTER — Encounter: Payer: Self-pay | Admitting: Family

## 2014-04-24 ENCOUNTER — Ambulatory Visit (INDEPENDENT_AMBULATORY_CARE_PROVIDER_SITE_OTHER): Payer: Medicare Other | Admitting: Family

## 2014-04-24 ENCOUNTER — Ambulatory Visit (HOSPITAL_COMMUNITY)
Admission: RE | Admit: 2014-04-24 | Discharge: 2014-04-24 | Disposition: A | Discharge status: Home / Self Care | Payer: Medicare Other | Source: Ambulatory Visit | Attending: Surgery | Admitting: Surgery

## 2014-04-24 ENCOUNTER — Encounter: Payer: Self-pay | Admitting: Family

## 2014-04-24 VITALS — BP 123/81 | HR 83 | Resp 16 | Ht 64.0 in | Wt 118.0 lb

## 2014-04-24 DIAGNOSIS — I739 Peripheral vascular disease, unspecified: Secondary | ICD-10-CM

## 2014-04-24 DIAGNOSIS — Z48812 Encounter for surgical aftercare following surgery on the circulatory system: Secondary | ICD-10-CM

## 2014-04-24 NOTE — Progress Notes (Signed)
[facility] HISTORY AND PHYSICAL -PAD  History of Present Illness Kenneth Merritt is a 63 y.o. male patient of Dr. Trula Slade status post aortobifem bypass graft in March 2011. He returns today for follow up.   Pt. denies claudication in legs with walking, denies non healing wounds.  Pt denies any history of stroke or TIA symptoms.  The patient denies New Medical or Surgical History. Pt states that he walks a great deal, limited by dyspnea, has emphysema. He has itching and burning around his upper right rib cage from shingles, states this started 2 days after receiving the Herpes Zoster vaccine, per pt. His appetite is good.  Pt Diabetic: No Pt smoker: former smoker, quit in 2011  Pt meds include: Statin :Yes ASA: No Other anticoagulants/antiplatelets: takes Eliquis for atrial fib.  Past Medical History  Diagnosis Date  . GERD (gastroesophageal reflux disease)   . COPD (chronic obstructive pulmonary disease)     severe by PFTs August 2010  . 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 Atrial fibrillation     post-op a-fib. 3/11. post Aortobifem , /  Atrial fibrillation from nebulizer treatment  May, 2012, while hospitalized  . Renal artery stenosis      presumably corrected at time of aortobifem surgery March, 2011  . Tobacco user      stop smoking  . Pneumothorax      2011  before aortobifemoral surgery,  resolved  . Peripheral vascular disease, unspecified     bilateral intermittent claudication  . CAD (coronary artery disease), native coronary artery     catheterization 06/2009.Marland KitchenMarland Kitchen70% proximal LAD and 30% proximal RCA. normal LV function, no intervention planned prior to surgery for aorta. LV normal function  by catheter 8/10.   [name] Dyslipidemia   . DJD (degenerative joint disease)   . Ejection fraction     60%, echo, May, 2012, rapid atrial fib at the time  .  Cancer 2013    Prostate Radiation  . Myocardial infarction 2011    Social History History  Substance Use Topics  . Smoking status: Former Smoker -- 2.00 packs/day for 40 years    Types: Cigarettes    Quit date: 01/11/2010  . Smokeless tobacco: Never Used  . Alcohol Use: No    Family History Family History  Problem Relation Age of Onset  . Cancer Father     prostate    Past Surgical History  Procedure Laterality Date  . Abdominal aortic aneurysm repair  01/11/2010  . Pr vein bypass graft,aorto-fem-pop    . Inguinal hernia repair      RIGHT    No Known Allergies  Current Outpatient Prescriptions  Medication Sig Dispense Refill  . albuterol (PROVENTIL HFA;VENTOLIN HFA) 108 (90 BASE) MCG/ACT inhaler Inhale 2 puffs into the lungs every 6 (six) hours as needed for wheezing or shortness of breath.      [name] albuterol (PROVENTIL) (2.5 MG/3ML) 0.083% nebulizer solution Take 2.5 mg by nebulization 3 (three) times daily as needed for wheezing or shortness of breath.      . ALPRAZolam (XANAX) 0.5 MG tablet Take 0.5 mg by mouth 2 (two) times daily.       [name] apixaban (ELIQUIS) 5 MG TABS tablet Take 5 mg by mouth 2 (two) times daily.      [name] gabapentin (NEURONTIN) 300 MG capsule Take  300 mg by mouth 3 (three) times daily.      [name] HYDROcodone-acetaminophen (NORCO/VICODIN) 5-325 MG per tablet Take 1 tablet by mouth every 6 (six) hours as needed for moderate pain.      . metoprolol (LOPRESSOR) 50 MG tablet Take 50 mg by mouth 2 (two) times daily.      . nitroGLYCERIN (NITROSTAT) 0.4 MG SL tablet Place 1 tablet (0.4 mg total) under the tongue every 5 (five) minutes as needed.  25 tablet  3  . omeprazole (PRILOSEC) 20 MG capsule Take 1 capsule by mouth every morning.       . predniSONE (DELTASONE) 10 MG tablet Take 10 mg by mouth every evening.      . simvastatin (ZOCOR) 40 MG tablet Take 1 tablet (40 mg total) by mouth every evening.  30 tablet  2   No current facility-administered medications  for this visit.    ROS: See HPI for pertinent positives and negatives.   Physical Examination   Filed Vitals:   04/24/14 1203  BP: 123/81  Pulse: 83  Resp: 16  Height: 5\' 4"  (1.626 m)  Weight: 118 lb (53.524 kg)  SpO2: 97%   Body mass index is 20.24 kg/(m^2).   General: A&O x 3, WDWN. Gait: normal Eyes: PERRLA. Pulmonary: diminished breath sounds in all fields, expiratory wheezes, rhonchi in anterior fields. Cardiac: regular Rythm , without detected murmur.         Carotid Bruits Left Right   Negative Negative  Aorta is not palpable. Radial pulses: are 2+ palpable and =                           VASCULAR EXAM: Extremities without ischemic changes  without Gangrene; without open wounds.                                                                                                          LE Pulses LEFT RIGHT       FEMORAL  2+ palpable  1+ palpable        POPLITEAL  not palpable   not palpable       POSTERIOR TIBIAL  not palpable   not palpable        DORSALIS PEDIS      ANTERIOR TIBIAL 2+ palpable  2+ palpable    Abdomen: soft, NT, no masses. Skin: no rashes, no ulcers noted. Musculoskeletal: no muscle wasting or atrophy.  Neurologic: A&O X 3; Appropriate Affect ; SENSATION: normal; MOTOR FUNCTION:  moving all extremities equally, motor strength 5/5 throughout. Speech is fluent/normal. CN 2-12 intact.   Non-Invasive Vascular Imaging: DATE: 04/24/2014 ABI: RIGHT 1.01, Waveforms: bi and triphasic;  LEFT 0.91, Waveforms: triphasic Previous (10/20/12) ABI's: Right: .012, Left: 1.10   ASSESSMENT: Kenneth Merritt is a 63 y.o. male who is status post aortobifem bypass graft in March 2011.  ABI's remain normal although the right leg decreased to the lower end of normal. He denies any claudication symptoms. Fortunately he does not have DM.  His atherosclerotic risk factors are former smoker and CAD.   PLAN:  I discussed in depth with the patient the nature  of atherosclerosis, and emphasized the importance of maximal medical management including strict control of blood pressure, blood glucose, and lipid levels, obtaining regular exercise, and continued cessation of smoking.  The patient is aware that without maximal medical management the underlying atherosclerotic disease process will progress, limiting the benefit of any interventions.  Based on the patient's vascular studies and examination, pt will return to clinic in 1 year for ABI's.  The patient was given information about PAD including signs, symptoms, treatment, what symptoms should prompt the patient to seek immediate medical care, and risk reduction measures to take.  Clemon Chambers, RN, MSN, FNP-C [facility] Phone: [phone]  Clinic MD: Trula Slade  04/24/2014 12:27 PM

## 2014-04-24 NOTE — Addendum Note (Signed)
Addended by: Mena Goes on: 04/24/2014 02:39 PM   Modules accepted: Orders

## 2014-06-06 ENCOUNTER — Ambulatory Visit (INDEPENDENT_AMBULATORY_CARE_PROVIDER_SITE_OTHER): Payer: Medicare Other | Admitting: Urology

## 2014-06-06 DIAGNOSIS — C61 Malignant neoplasm of prostate: Secondary | ICD-10-CM

## 2014-06-12 ENCOUNTER — Other Ambulatory Visit: Payer: Self-pay | Admitting: *Deleted

## 2014-06-12 MED ORDER — SIMVASTATIN 40 MG PO TABS: 40.0000 mg | ORAL_TABLET | Freq: Every evening | ORAL | Status: DC

## 2014-06-15 ENCOUNTER — Other Ambulatory Visit: Payer: Self-pay | Admitting: *Deleted

## 2014-06-15 MED ORDER — SIMVASTATIN 40 MG PO TABS: 40.0000 mg | ORAL_TABLET | Freq: Every evening | ORAL | Status: DC

## 2014-06-19 ENCOUNTER — Other Ambulatory Visit: Payer: Self-pay | Admitting: *Deleted

## 2014-06-19 MED ORDER — APIXABAN 5 MG PO TABS: 5.0000 mg | ORAL_TABLET | Freq: Two times a day (BID) | ORAL | Status: DC

## 2014-10-17 ENCOUNTER — Telehealth: Payer: Self-pay | Admitting: *Deleted

## 2014-10-17 NOTE — Telephone Encounter (Signed)
Patient returned call.  Stated that he was planning to have acupuncture or some type of procedure for nerves due to pain from shingles.  Informed patient that we typically require a note from surgeon or person doing procedure stating what specific procedure they are having done & what they are requesting.  Patient verbalized understanding & will go back on 10/23/2014.  He will request this at that time.

## 2014-10-17 NOTE — Telephone Encounter (Signed)
Message left on voice mail - need to find out something about blood thinner.  Attempted to return call - left message.

## 2014-11-26 ENCOUNTER — Emergency Department (HOSPITAL_COMMUNITY): Payer: Medicare Other

## 2014-11-26 ENCOUNTER — Encounter (HOSPITAL_COMMUNITY): Payer: Self-pay

## 2014-11-26 ENCOUNTER — Emergency Department (HOSPITAL_COMMUNITY)
Admission: EM | Admit: 2014-11-26 | Discharge: 2014-11-26 | Disposition: A | Discharge status: Home / Self Care | Payer: Medicare Other | Attending: Emergency Medicine | Admitting: Emergency Medicine

## 2014-11-26 DIAGNOSIS — I252 Old myocardial infarction: Secondary | ICD-10-CM | POA: Insufficient documentation

## 2014-11-26 DIAGNOSIS — I4891 Unspecified atrial fibrillation: Secondary | ICD-10-CM | POA: Diagnosis not present

## 2014-11-26 DIAGNOSIS — Z87891 Personal history of nicotine dependence: Secondary | ICD-10-CM | POA: Insufficient documentation

## 2014-11-26 DIAGNOSIS — Z9889 Other specified postprocedural states: Secondary | ICD-10-CM | POA: Insufficient documentation

## 2014-11-26 DIAGNOSIS — E785 Hyperlipidemia, unspecified: Secondary | ICD-10-CM | POA: Diagnosis not present

## 2014-11-26 DIAGNOSIS — I2510 Atherosclerotic heart disease of native coronary artery without angina pectoris: Secondary | ICD-10-CM | POA: Insufficient documentation

## 2014-11-26 DIAGNOSIS — J441 Chronic obstructive pulmonary disease with (acute) exacerbation: Secondary | ICD-10-CM | POA: Diagnosis not present

## 2014-11-26 DIAGNOSIS — R071 Chest pain on breathing: Secondary | ICD-10-CM

## 2014-11-26 DIAGNOSIS — R079 Chest pain, unspecified: Secondary | ICD-10-CM

## 2014-11-26 DIAGNOSIS — M1990 Unspecified osteoarthritis, unspecified site: Secondary | ICD-10-CM | POA: Diagnosis not present

## 2014-11-26 DIAGNOSIS — Z79899 Other long term (current) drug therapy: Secondary | ICD-10-CM | POA: Insufficient documentation

## 2014-11-26 DIAGNOSIS — R0602 Shortness of breath: Secondary | ICD-10-CM

## 2014-11-26 DIAGNOSIS — K219 Gastro-esophageal reflux disease without esophagitis: Secondary | ICD-10-CM | POA: Diagnosis not present

## 2014-11-26 DIAGNOSIS — Z8546 Personal history of malignant neoplasm of prostate: Secondary | ICD-10-CM | POA: Insufficient documentation

## 2014-11-26 LAB — CBC
HEMATOCRIT: 44.4 % (ref 39.0–52.0)
HEMOGLOBIN: 14.4 g/dL (ref 13.0–17.0)
MCH: 29.2 pg (ref 26.0–34.0)
MCHC: 32.4 g/dL (ref 30.0–36.0)
MCV: 90.1 fL (ref 78.0–100.0)
Platelets: 197 10*3/uL (ref 150–400)
RBC: 4.93 MIL/uL (ref 4.22–5.81)
RDW: 14.7 % (ref 11.5–15.5)
WBC: 7.9 10*3/uL (ref 4.0–10.5)

## 2014-11-26 LAB — BASIC METABOLIC PANEL
Anion gap: 8 (ref 5–15)
BUN: 8 mg/dL (ref 6–23)
CHLORIDE: 110 meq/L (ref 96–112)
CO2: 23 mmol/L (ref 19–32)
Calcium: 8.7 mg/dL (ref 8.4–10.5)
Creatinine, Ser: 0.64 mg/dL (ref 0.50–1.35)
GFR calc Af Amer: >90 mL/min (ref >90)
GFR calc non Af Amer: >90 mL/min (ref >90)
GLUCOSE: 91 mg/dL (ref 70–99)
Potassium: 4.2 mmol/L (ref 3.5–5.1)
SODIUM: 141 mmol/L (ref 135–145)

## 2014-11-26 LAB — BRAIN NATRIURETIC PEPTIDE: B NATRIURETIC PEPTIDE 5: 33.6 pg/mL (ref 0.0–100.0)

## 2014-11-26 LAB — I-STAT TROPONIN, ED: TROPONIN I, POC: 0 ng/mL (ref 0.00–0.08)

## 2014-11-26 MED ORDER — METHYLPREDNISOLONE SODIUM SUCC 125 MG IJ SOLR
125.0000 mg | Freq: Once | INTRAMUSCULAR | Status: AC
  Administered 2014-11-26: 125 mg via INTRAVENOUS
  Filled 2014-11-26: qty 2

## 2014-11-26 MED ORDER — ALBUTEROL (5 MG/ML) CONTINUOUS INHALATION SOLN
10.0000 mg | INHALATION_SOLUTION | RESPIRATORY_TRACT | Status: DC
  Administered 2014-11-26: 10 mg via RESPIRATORY_TRACT
  Filled 2014-11-26: qty 20

## 2014-11-26 MED ORDER — PREDNISONE 20 MG PO TABS: 40.0000 mg | ORAL_TABLET | Freq: Every day | ORAL | Status: DC

## 2014-11-26 MED ORDER — IPRATROPIUM BROMIDE 0.02 % IN SOLN
1.0000 mg | Freq: Once | RESPIRATORY_TRACT | Status: AC
  Administered 2014-11-26: 1 mg via RESPIRATORY_TRACT
  Filled 2014-11-26: qty 5

## 2014-11-26 MED ORDER — MAGNESIUM SULFATE 2 GM/50ML IV SOLN
2.0000 g | Freq: Once | INTRAVENOUS | Status: AC
  Administered 2014-11-26: 2 g via INTRAVENOUS
  Filled 2014-11-26: qty 50

## 2014-11-26 MED ORDER — IPRATROPIUM-ALBUTEROL 0.5-2.5 (3) MG/3ML IN SOLN
3.0000 mL | Freq: Once | RESPIRATORY_TRACT | Status: AC
  Administered 2014-11-26: 3 mL via RESPIRATORY_TRACT
  Filled 2014-11-26: qty 3

## 2014-11-26 MED ORDER — ALBUTEROL SULFATE HFA 108 (90 BASE) MCG/ACT IN AERS
1.0000 | INHALATION_SPRAY | RESPIRATORY_TRACT | Status: DC | PRN
  Filled 2014-11-26: qty 6.7

## 2014-11-26 NOTE — ED Notes (Signed)
After breathing treatment, patient still has audible wheezing.

## 2014-11-26 NOTE — ED Notes (Signed)
Patient transported to X-ray 

## 2014-11-26 NOTE — ED Notes (Signed)
Pt. Reports increasing SOB x3 days, audible wheezing noted in triage. Pt. Also c/o chest cramping pain on right side.

## 2014-11-26 NOTE — ED Notes (Signed)
Pt made aware to return if symptoms worsen or if any life threatening symptoms occur.   

## 2014-11-26 NOTE — ED Provider Notes (Addendum)
CSN: [id number]     Arrival date & time 11/26/14  1737 History   First MD Initiated Contact with Patient 11/26/14 1811     Chief Complaint  Patient presents with  . Shortness of Breath  . Chest Pain     (Consider location/radiation/quality/duration/timing/severity/associated sxs/prior Treatment) Patient is a 64 y.o. male presenting with shortness of breath and chest pain. The history is provided by the patient.  Shortness of Breath Severity:  Severe Onset quality:  Gradual Duration:  3 days Timing:  Constant Progression:  Worsening Chronicity:  Recurrent Context: weather changes   Relieved by:  Nothing Worsened by:  Exertion and activity Ineffective treatments:  Inhaler Associated symptoms: chest pain, cough and wheezing   Associated symptoms: no abdominal pain, no fever, no hemoptysis and no sputum production   Risk factors: no hx of PE/DVT and no prolonged immobilization   Risk factors comment:  Hx of copd and a.fib Chest Pain Associated symptoms: cough and shortness of breath   Associated symptoms: no abdominal pain and no fever     Past Medical History  Diagnosis Date  . GERD (gastroesophageal reflux disease)   . COPD (chronic obstructive pulmonary disease)     severe by PFTs August 2010  . 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 Atrial fibrillation     post-op a-fib. 3/11. post Aortobifem , /  Atrial fibrillation from nebulizer treatment  May, 2012, while hospitalized  . Renal artery stenosis      presumably corrected at time of aortobifem surgery March, 2011  . Tobacco user      stop smoking  . Pneumothorax      2011  before aortobifemoral surgery,  resolved  . Peripheral vascular disease, unspecified     bilateral intermittent claudication  . CAD (coronary artery disease), native coronary artery     catheterization 06/2009.Marland KitchenMarland Kitchen70% proximal LAD and 30% proximal RCA. normal LV  function, no intervention planned prior to surgery for aorta. LV normal function  by catheter 8/10.   [name] Dyslipidemia   . DJD (degenerative joint disease)   . Ejection fraction     60%, echo, May, 2012, rapid atrial fib at the time  . Cancer 2013    Prostate Radiation  . Myocardial infarction 2011   Past Surgical History  Procedure Laterality Date  . Abdominal aortic aneurysm repair  01/11/2010  . Pr vein bypass graft,aorto-fem-pop    . Inguinal hernia repair      RIGHT   Family History  Problem Relation Age of Onset  . Cancer Father     prostate   History  Substance Use Topics  . Smoking status: Former Smoker -- 2.00 packs/day for 40 years    Types: Cigarettes    Quit date: 01/11/2010  . Smokeless tobacco: Never Used  . Alcohol Use: No    Review of Systems  Constitutional: Negative for fever.  Respiratory: Positive for cough, shortness of breath and wheezing. Negative for hemoptysis and sputum production.   Cardiovascular: Positive for chest pain.  Gastrointestinal: Negative for abdominal pain.  All other systems reviewed and are negative.     Allergies  Review of patient's allergies indicates no known allergies.  Home Medications   Prior to Admission medications   Medication Sig Start Date End Date Taking? Authorizing Provider  albuterol (PROVENTIL HFA;VENTOLIN HFA) 108 (90 BASE) MCG/ACT inhaler Inhale 2 puffs  into the lungs every 6 (six) hours as needed for wheezing or shortness of breath.    Historical Provider, MD  albuterol (PROVENTIL) (2.5 MG/3ML) 0.083% nebulizer solution Take 2.5 mg by nebulization 3 (three) times daily as needed for wheezing or shortness of breath.    Historical Provider, MD  ALPRAZolam [name]) 0.5 MG tablet Take 0.5 mg by mouth 2 (two) times daily.     Historical Provider, MD  apixaban (ELIQUIS) 5 MG TABS tablet Take 1 tablet (5 mg total) by mouth 2 (two) times daily. 06/19/14   [name], MD  gabapentin (NEURONTIN) 300 MG capsule Take  300 mg by mouth 3 (three) times daily. 02/13/14   Historical Provider, MD  HYDROcodone-acetaminophen (NORCO/VICODIN) 5-325 MG per tablet Take 1 tablet by mouth every 6 (six) hours as needed for moderate pain.    Historical Provider, MD  metoprolol (LOPRESSOR) 50 MG tablet Take 50 mg by mouth 2 (two) times daily. 12/27/13   Historical Provider, MD  nitroGLYCERIN (NITROSTAT) 0.4 MG SL tablet Place 1 tablet (0.4 mg total) under the tongue every 5 (five) minutes as needed. 09/20/12   [name], MD  omeprazole (PRILOSEC) 20 MG capsule Take 1 capsule by mouth every morning.  04/21/11   Historical Provider, MD  predniSONE (DELTASONE) 10 MG tablet Take 10 mg by mouth every evening.    Historical Provider, MD  simvastatin (ZOCOR) 40 MG tablet Take 1 tablet (40 mg total) by mouth every evening. 06/15/14   [name], MD   SpO2 96% Physical Exam  Constitutional: He is oriented to person, place, and time. He appears well-developed and well-nourished. No distress.  HENT:  Head: Normocephalic and atraumatic.  Mouth/Throat: Oropharynx is clear and moist.  Eyes: Conjunctivae and EOM are normal. Pupils are equal, round, and reactive to light.  Neck: Normal range of motion. Neck supple.  Cardiovascular: Normal rate, regular rhythm and intact distal pulses.   No murmur heard. Pulmonary/Chest: Effort normal. Tachypnea noted. No respiratory distress. He has wheezes. He has no rales.  Abdominal: Soft. He exhibits no distension. There is no tenderness. There is no rebound and no guarding.  Musculoskeletal: Normal range of motion. He exhibits no edema or tenderness.  Neurological: He is alert and oriented to person, place, and time.  Skin: Skin is warm and dry. No rash noted. No erythema.  Psychiatric: He has a normal mood and affect. His behavior is normal.  Nursing note and vitals reviewed.   ED Course  Procedures (including critical care time) Labs Review Labs Reviewed  CBC  BASIC METABOLIC PANEL   BRAIN NATRIURETIC PEPTIDE  I-STAT [employer], ED    Imaging Review Dg Chest 2 View  11/26/2014   CLINICAL DATA:  Pt is SOB and severely wheezing x3 days. Pt states he also has chest pain that began today but associates it with the difficulty breathing. Hx shingles, COPD, emphysema, bronchitis, MI, prostate ca, smoker x40 yrs but quit 4 years ago after aortic bypass surgery.  EXAM: CHEST  2 VIEW  COMPARISON:  10/18/2014  FINDINGS: Cardiac silhouette is normal in size and configuration. Normal mediastinal and hilar contours.  Lungs are hyperexpanded. There is stable scarring in the upper lobes. No lung consolidation or edema. No mass or discrete nodule. No pleural effusion or pneumothorax.  Bony thorax is demineralized. Mild compression fracture of a mid thoracic vertebral body is stable.  IMPRESSION: 1. No acute cardiopulmonary disease. 2. COPD and areas of lung scarring, stable from the prior  study.   Electronically Signed   By: Lajean Manes M.D.   On: 11/26/2014 18:46     EKG Interpretation   Date/Time:  Sunday November 26 2014 17:44:17 EST Ventricular Rate:  90 PR Interval:  124 QRS Duration: 78 QT Interval:  356 QTC Calculation: 435 R Axis:   63 Text Interpretation:  Sinus rhythm with Premature supraventricular  complexes Otherwise normal ECG No significant change since last tracing  Confirmed by Maryan Rued  MD, Loree Fee ([id number]) on 11/26/2014 6:11:37 PM      MDM   Final diagnoses:  COPD exacerbation   patient with a history of COPD who comes in with 3 days of worsening shortness of breath. He has audible wheezing is only able to speak in 1-2 word sentences. He denies any distal edema or signs concerning for CHF. He does have a history of COPD and feel this is a COPD exacerbation at this time. He denies any infectious symptoms. Patient is not Eliquis so feel PE would be less likely.  Patient given albuterol, Atrovent, Solu-Medrol, magnesium.  7:34 PM Labs are all within normal  limits. Chest x-ray without acute finding.  8:38 PM Pt feeling better and wheezing resolved.  Will ambulate and if ok will d/c  Blanchie Dessert, MD 11/26/14 2038  Blanchie Dessert, MD 11/26/14 2210

## 2014-11-26 NOTE — Discharge Instructions (Signed)

## 2014-11-26 NOTE — ED Notes (Signed)
Pt ambulates hallway. Tolerates well. HR 118 before 125 post ambulation. Spo2 95 before 93 post ambulation.

## 2014-11-26 NOTE — ED Notes (Signed)
Pt took the neb treatment off, stating "it's making me jittery and it's almost done anyway".

## 2015-01-16 ENCOUNTER — Emergency Department (HOSPITAL_COMMUNITY)
Admission: EM | Admit: 2015-01-16 | Discharge: 2015-01-17 | Disposition: A | Discharge status: Home / Self Care | Payer: Medicare Other | Attending: Emergency Medicine | Admitting: Emergency Medicine

## 2015-01-16 ENCOUNTER — Encounter (HOSPITAL_COMMUNITY): Payer: Self-pay | Admitting: Emergency Medicine

## 2015-01-16 DIAGNOSIS — Z79899 Other long term (current) drug therapy: Secondary | ICD-10-CM | POA: Diagnosis not present

## 2015-01-16 DIAGNOSIS — K219 Gastro-esophageal reflux disease without esophagitis: Secondary | ICD-10-CM | POA: Insufficient documentation

## 2015-01-16 DIAGNOSIS — I2510 Atherosclerotic heart disease of native coronary artery without angina pectoris: Secondary | ICD-10-CM | POA: Diagnosis not present

## 2015-01-16 DIAGNOSIS — J441 Chronic obstructive pulmonary disease with (acute) exacerbation: Secondary | ICD-10-CM | POA: Diagnosis not present

## 2015-01-16 DIAGNOSIS — Z7901 Long term (current) use of anticoagulants: Secondary | ICD-10-CM | POA: Insufficient documentation

## 2015-01-16 DIAGNOSIS — Z87891 Personal history of nicotine dependence: Secondary | ICD-10-CM | POA: Diagnosis not present

## 2015-01-16 DIAGNOSIS — R0902 Hypoxemia: Secondary | ICD-10-CM | POA: Diagnosis not present

## 2015-01-16 DIAGNOSIS — Z8546 Personal history of malignant neoplasm of prostate: Secondary | ICD-10-CM | POA: Insufficient documentation

## 2015-01-16 DIAGNOSIS — Z87448 Personal history of other diseases of urinary system: Secondary | ICD-10-CM | POA: Insufficient documentation

## 2015-01-16 DIAGNOSIS — Z7952 Long term (current) use of systemic steroids: Secondary | ICD-10-CM | POA: Insufficient documentation

## 2015-01-16 DIAGNOSIS — R0602 Shortness of breath: Secondary | ICD-10-CM | POA: Diagnosis present

## 2015-01-16 DIAGNOSIS — R0600 Dyspnea, unspecified: Secondary | ICD-10-CM

## 2015-01-16 MED ORDER — IPRATROPIUM-ALBUTEROL 0.5-2.5 (3) MG/3ML IN SOLN
3.0000 mL | Freq: Once | RESPIRATORY_TRACT | Status: AC
  Administered 2015-01-17: 3 mL via RESPIRATORY_TRACT
  Filled 2015-01-16: qty 3

## 2015-01-16 MED ORDER — ALBUTEROL SULFATE (2.5 MG/3ML) 0.083% IN NEBU
2.5000 mg | INHALATION_SOLUTION | Freq: Once | RESPIRATORY_TRACT | Status: AC
  Administered 2015-01-17: 2.5 mg via RESPIRATORY_TRACT
  Filled 2015-01-16: qty 3

## 2015-01-16 MED ORDER — IPRATROPIUM BROMIDE 0.02 % IN SOLN: 0.5000 mg | Freq: Once | RESPIRATORY_TRACT | Status: DC

## 2015-01-16 NOTE — ED Notes (Signed)
Per EMS pt. Reports being claustrophobic and refused to continue wearing cpap. Pt. On 10L non-re breather sats mid 79s. Pt. Alert and oriented. RT paged.

## 2015-01-16 NOTE — ED Provider Notes (Signed)
TIME SEEN: 11:59PM  CHIEF COMPLAINT: Shortness of Breath   HPI Comments: Kenneth Merritt is a 64 y.o. male with a history of COPD, peripheral vascular disease, CAD, dyslipidemia and A-fib on Eliquis who presents to the Emergency Department complaining of SOB since yesterday. Pt reports productive cough with green phlegm that turned to white. He reports minimal left sided CP as well that is a tightness without radiation. He says that he has oxygen at home but rarely uses it. States he had to put himself on 3 L at home tonight. Pt reports he quit smoking 5 years ago. Symptoms worse with exertion. No alleviating factors. He denies fever. No lower extremity swelling or pain.   PCP HASANAJ  ROS: See HPI Constitutional: no fever  Eyes: no drainage  ENT: no runny nose   Cardiovascular:  chest pain  Resp: SOB  GI: no vomiting GU: no dysuria Integumentary: no rash  Allergy: no hives  Musculoskeletal: no leg swelling  Neurological: no slurred speech ROS otherwise negative  PAST MEDICAL HISTORY/PAST SURGICAL HISTORY:  Past Medical History  Diagnosis Date  . GERD (gastroesophageal reflux disease)   . COPD (chronic obstructive pulmonary disease)     severe by PFTs August 2010  . 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 Atrial fibrillation     post-op a-fib. 3/11. post Aortobifem , /  Atrial fibrillation from nebulizer treatment  May, 2012, while hospitalized  . Renal artery stenosis      presumably corrected at time of aortobifem surgery March, 2011  . Tobacco user      stop smoking  . Pneumothorax      2011  before aortobifemoral surgery,  resolved  . Peripheral vascular disease, unspecified     bilateral intermittent claudication  . CAD (coronary artery disease), native coronary artery     catheterization 06/2009.Marland KitchenMarland Kitchen70% proximal LAD and 30% proximal RCA. normal LV function, no intervention planned prior  to surgery for aorta. LV normal function  by catheter 8/10.   [name] Dyslipidemia   . DJD (degenerative joint disease)   . Ejection fraction     60%, echo, May, 2012, rapid atrial fib at the time  . Cancer 2013    Prostate Radiation  . Myocardial infarction 2011    MEDICATIONS:  Prior to Admission medications   Medication Sig Start Date End Date Taking? Authorizing Provider  albuterol (PROVENTIL HFA;VENTOLIN HFA) 108 (90 BASE) MCG/ACT inhaler Inhale 2 puffs into the lungs every 6 (six) hours as needed for wheezing or shortness of breath.   Yes Historical Provider, MD  albuterol (PROVENTIL) (2.5 MG/3ML) 0.083% nebulizer solution Take 2.5 mg by nebulization 3 (three) times daily as needed for wheezing or shortness of breath.   Yes Historical Provider, MD  ALPRAZolam [name]) 0.5 MG tablet Take 0.5 mg by mouth 2 (two) times daily.    Yes Historical Provider, MD  apixaban (ELIQUIS) 5 MG TABS tablet Take 1 tablet (5 mg total) by mouth 2 (two) times daily. 06/19/14  Yes [name], MD  gabapentin (NEURONTIN) 300 MG capsule Take 300 mg by mouth 3 (three) times daily. 02/13/14  Yes Historical Provider, MD  HYDROcodone-acetaminophen (NORCO/VICODIN) 5-325 MG per tablet Take 1 tablet by mouth every 6 (six) hours as needed for moderate pain.   Yes Historical Provider, MD  metoprolol (LOPRESSOR) 50 MG tablet Take 50 mg by mouth  2 (two) times daily. 12/27/13  Yes Historical Provider, MD  nitroGLYCERIN (NITROSTAT) 0.4 MG SL tablet Place 1 tablet (0.4 mg total) under the tongue every 5 (five) minutes as needed. 09/20/12  Yes [name], MD  omeprazole (PRILOSEC) 20 MG capsule Take 1 capsule by mouth every morning.  04/21/11  Yes Historical Provider, MD  predniSONE (DELTASONE) 10 MG tablet Take 10 mg by mouth every evening.   Yes Historical Provider, MD  simvastatin (ZOCOR) 40 MG tablet Take 1 tablet (40 mg total) by mouth every evening. 06/15/14  Yes [name], MD  predniSONE (DELTASONE) 20 MG tablet Take 2  tablets (40 mg total) by mouth daily. 11/26/14   Blanchie Dessert, MD    ALLERGIES:  No Known Allergies  SOCIAL HISTORY:  History  Substance Use Topics  . Smoking status: Former Smoker -- 2.00 packs/day for 40 years    Types: Cigarettes    Quit date: 01/11/2010  . Smokeless tobacco: Never Used  . Alcohol Use: No    FAMILY HISTORY: Family History  Problem Relation Age of Onset  . Cancer Father     prostate    EXAM: Pulse 111  Resp 25  Ht 5\' 4"  (1.626 m)  Wt 140 lb (63.504 kg)  BMI 24.02 kg/m2  SpO2 81% CONSTITUTIONAL: Alert and oriented and responds appropriately to questions. In moderate respiratory distress. Speaking in short sentences. Appears uncomfortable. HEAD: Normocephalic EYES: Conjunctivae clear, PERRL ENT: normal nose; no rhinorrhea; moist mucous membranes; pharynx without lesions noted NECK: Supple, no meningismus, no LAD  CARD: Regular and tachycardic. S1 and S2 appreciated; no murmurs, no clicks, no rubs, no gallops RESP: Normal chest excursion without splinting. Pt is tachypneic. Diffuse rhonchorous breath sounds and wheezing. Pt is hypoxic. Diminished at his bases bilaterally.  ABD/GI: Normal bowel sounds; non-distended; soft, non-tender, no rebound, no guarding BACK:  The back appears normal and is non-tender to palpation, there is no CVA tenderness EXT: Normal ROM in all joints; non-tender to palpation; no edema; normal capillary refill; no cyanosis. No calf tenderness or swelling.   SKIN: Normal color for age and race; warm NEURO: Moves all extremities equally PSYCH: The patient's mood and manner are appropriate. Grooming and personal hygiene are appropriate.  MEDICAL DECISION MAKING: Patient here with COPD exacerbation in moderate respiratory distress. Was placed on C Pap with EMS. Has a new oxygen requirement, sats 81% on room air at rest. Received IV Solu-Medrol with EMS, albuterol. Will obtain labs including troponin, chest x-ray, EKG. We'll give  continuous albuterol, Atrovent.  ED PROGRESS: Breath sounds have improved after continuous albuterol but he still has some rhonchi and is tachypneic. He has been weaned off an on her breather and is now on 3 L nasal cannula. Labs show leukocytosis with left shift. BNP and troponin negative. ABG is reassuring. Chest x-ray shows no infiltrate or edema. Patient reports that he is not on chronic steroids. It appears last time he received steroids was 2 months ago. I feel clinically patient has community acquired pneumonia, will give IV Levaquin. We'll ambulate patient.  2:15 AM  Pt maintained oxygen saturation .> 94% on 2 L with ambulation. His lungs are clear and most of the rhonchorous sounds appreciated are from upper airway like patient needs to cough. Have encouraged him to cough. Will give him his IV Levaquin and closely monitor patient. At this time I do not feel he needs admission given he has oxygen at home and is markedly improved.  2:45 AM  Pt is sleeping comfortably. His tachypnea has resolved. His heart rate is now in the 110s.  No further rhonchorous breath sounds. Sats 99% on 2 L nasal cannula.   3:30 AM  Pt still resting comfortably. When I reevaluate him his lungs are still clear after he is able to cough. Suspect possible community-acquired pneumonia and COPD exacerbation. Reports he has albuterol and a nebulizer machine at home. Have advised him to use his nebulizer every 4 hours. States he does not need any refills. Will also discharge home on prednisone burst and Levaquin. Have discussed strict return precautions. He verbalized understanding and is comfortable with plan for discharge.   EKG Interpretation  Date/Time:  Wednesday January 17 2015 00:41:04 EST Ventricular Rate:  127 PR Interval:  125 QRS Duration: 80 QT Interval:  308 QTC Calculation: 448 R Axis:   35 Text Interpretation:  Sinus tachycardia Confirmed by Tinea Nobile,  DO, Nuriyah Hanline ([id number]) on 01/17/2015 12:42:25 AM          I personally performed the services described in this documentation, which was scribed in my presence. The recorded information has been reviewed and is accurate.    Bunn, DO 01/17/15 [phone]

## 2015-01-16 NOTE — ED Notes (Signed)
Pt c/o sob x 2 days, pt was placed on cpap enroute was given 125mg  solumedrol, 4mg  zofran, and 2.5 of albuterol.

## 2015-01-17 ENCOUNTER — Emergency Department (HOSPITAL_COMMUNITY): Payer: Medicare Other

## 2015-01-17 DIAGNOSIS — J441 Chronic obstructive pulmonary disease with (acute) exacerbation: Secondary | ICD-10-CM | POA: Diagnosis not present

## 2015-01-17 LAB — BLOOD GAS, ARTERIAL
Acid-base deficit: 0.2 mmol/L (ref 0.0–2.0)
Bicarbonate: 23.8 mEq/L (ref 20.0–24.0)
DRAWN BY: 382351
O2 Content: 3 L/min
O2 SAT: 98.1 %
PATIENT TEMPERATURE: 37
PH ART: 7.418 (ref 7.350–7.450)
PO2 ART: 116 mmHg — AB (ref 80.0–100.0)
TCO2: 20.9 mmol/L (ref 0–100)
pCO2 arterial: 37.6 mmHg (ref 35.0–45.0)

## 2015-01-17 LAB — BASIC METABOLIC PANEL
Anion gap: 8 (ref 5–15)
BUN: 11 mg/dL (ref 6–23)
CO2: 26 mmol/L (ref 19–32)
Calcium: 8.7 mg/dL (ref 8.4–10.5)
Chloride: 108 mmol/L (ref 96–112)
Creatinine, Ser: 0.66 mg/dL (ref 0.50–1.35)
GFR calc Af Amer: >90 mL/min (ref >90)
GFR calc non Af Amer: >90 mL/min (ref >90)
GLUCOSE: 123 mg/dL — AB (ref 70–99)
Potassium: 3.9 mmol/L (ref 3.5–5.1)
Sodium: 142 mmol/L (ref 135–145)

## 2015-01-17 LAB — CBC WITH DIFFERENTIAL/PLATELET
BASOS ABS: 0 10*3/uL (ref 0.0–0.1)
Basophils Relative: 0 % (ref 0–1)
EOS PCT: 0 % (ref 0–5)
Eosinophils Absolute: 0 10*3/uL (ref 0.0–0.7)
HCT: 42.6 % (ref 39.0–52.0)
HEMOGLOBIN: 13.6 g/dL (ref 13.0–17.0)
LYMPHS ABS: 0.6 10*3/uL — AB (ref 0.7–4.0)
LYMPHS PCT: 3 % — AB (ref 12–46)
MCH: 29.5 pg (ref 26.0–34.0)
MCHC: 31.9 g/dL (ref 30.0–36.0)
MCV: 92.4 fL (ref 78.0–100.0)
MONOS PCT: 10 % (ref 3–12)
Monocytes Absolute: 1.7 10*3/uL — ABNORMAL HIGH (ref 0.1–1.0)
NEUTROS ABS: 15.5 10*3/uL — AB (ref 1.7–7.7)
Neutrophils Relative %: 87 % — ABNORMAL HIGH (ref 43–77)
Platelets: 193 10*3/uL (ref 150–400)
RBC: 4.61 MIL/uL (ref 4.22–5.81)
RDW: 13.7 % (ref 11.5–15.5)
WBC: 17.9 10*3/uL — AB (ref 4.0–10.5)

## 2015-01-17 LAB — TROPONIN I: Troponin I: <0.03 ng/mL (ref <0.031)

## 2015-01-17 LAB — BRAIN NATRIURETIC PEPTIDE: B Natriuretic Peptide: 42 pg/mL (ref 0.0–100.0)

## 2015-01-17 MED ORDER — SODIUM CHLORIDE 0.9 % IV SOLN
INTRAVENOUS | Status: DC
Start: 2015-01-17 — End: 2015-01-17
  Administered 2015-01-17: via INTRAVENOUS

## 2015-01-17 MED ORDER — DEXTROSE 5 % IV SOLN: 500.0000 mg | Freq: Once | INTRAVENOUS | Status: DC

## 2015-01-17 MED ORDER — LEVOFLOXACIN 500 MG PO TABS: 500.0000 mg | ORAL_TABLET | Freq: Every day | ORAL | Status: DC

## 2015-01-17 MED ORDER — GUAIFENESIN 100 MG/5ML PO LIQD: 100.0000 mg | ORAL | Status: DC | PRN

## 2015-01-17 MED ORDER — ALBUTEROL SULFATE HFA 108 (90 BASE) MCG/ACT IN AERS: 2.0000 | INHALATION_SPRAY | RESPIRATORY_TRACT | Status: DC | PRN

## 2015-01-17 MED ORDER — ALPRAZOLAM 0.5 MG PO TABS
0.5000 mg | ORAL_TABLET | Freq: Once | ORAL | Status: AC
  Administered 2015-01-17: 0.5 mg via ORAL
  Filled 2015-01-17: qty 1

## 2015-01-17 MED ORDER — IPRATROPIUM BROMIDE 0.02 % IN SOLN
RESPIRATORY_TRACT | Status: AC
  Filled 2015-01-17: qty 2.5

## 2015-01-17 MED ORDER — PREDNISONE 20 MG PO TABS: 60.0000 mg | ORAL_TABLET | Freq: Every day | ORAL | Status: DC

## 2015-01-17 MED ORDER — ALBUTEROL (5 MG/ML) CONTINUOUS INHALATION SOLN
10.0000 mg/h | INHALATION_SOLUTION | RESPIRATORY_TRACT | Status: DC
  Administered 2015-01-17: 10 mg/h via RESPIRATORY_TRACT
  Filled 2015-01-17: qty 20

## 2015-01-17 MED ORDER — LEVOFLOXACIN IN D5W 500 MG/100ML IV SOLN
500.0000 mg | Freq: Once | INTRAVENOUS | Status: AC
  Administered 2015-01-17: 500 mg via INTRAVENOUS
  Filled 2015-01-17: qty 100

## 2015-01-17 MED ORDER — DEXTROSE 5 % IV SOLN: 1.0000 g | Freq: Once | INTRAVENOUS | Status: DC

## 2015-01-17 NOTE — Discharge Instructions (Signed)
Please give yourself and albuterol nebulizer treatment every 4 hours for the next 48 hours and then every 4 hours as needed after this. If your shortness of breath worsens, I recommend you return to the emergency department. I recommend you continue wearing 2-3 L of oxygen for the next week. We are also discharging you on antibiotics to treat you for possible pneumonia. Please call your primary care physician tomorrow to schedule an appointment this week for reevaluation.    Chronic Obstructive Pulmonary Disease Exacerbation Chronic obstructive pulmonary disease (COPD) is a common lung condition in which airflow from the lungs is limited. COPD is a general term that can be used to describe many different lung problems that limit airflow, including chronic bronchitis and emphysema. COPD exacerbations are episodes when breathing symptoms become much worse and require extra treatment. Without treatment, COPD exacerbations can be life threatening, and frequent COPD exacerbations can cause further damage to your lungs. CAUSES   Respiratory infections.   Exposure to smoke.   Exposure to air pollution, chemical fumes, or dust. Sometimes there is no apparent cause or trigger. RISK FACTORS  Smoking cigarettes.  Older age.  Frequent prior COPD exacerbations. SIGNS AND SYMPTOMS   Increased coughing.   Increased thick spit (sputum) production.   Increased wheezing.   Increased shortness of breath.   Rapid breathing.   Chest tightness. DIAGNOSIS  Your medical history, a physical exam, and tests will help your health care provider make a diagnosis. Tests may include:  A chest X-ray.  Basic lab tests.  Sputum testing.  An arterial blood gas test. TREATMENT  Depending on the severity of your COPD exacerbation, you may need to be admitted to a hospital for treatment. Some of the treatments commonly used to treat COPD exacerbations are:   Antibiotic medicines.    Bronchodilators. These are drugs that expand the air passages. They may be given with an inhaler or nebulizer. Spacer devices may be needed to help improve drug delivery.  Corticosteroid medicines.  Supplemental oxygen therapy.  HOME CARE INSTRUCTIONS   Do not smoke. Quitting smoking is very important to prevent COPD from getting worse and exacerbations from happening as often.  Avoid exposure to all substances that irritate the airway, especially to tobacco smoke.   If you were prescribed an antibiotic medicine, finish it all even if you start to feel better.  Take all medicines as directed by your health care provider.It is important to use correct technique with inhaled medicines.  Drink enough fluids to keep your urine clear or pale yellow (unless you have a medical condition that requires fluid restriction).  Use a cool mist vaporizer. This makes it easier to clear your chest when you cough.   If you have a home nebulizer and oxygen, continue to use them as directed.   Maintain all necessary vaccinations to prevent infections.   Exercise regularly.   Eat a healthy diet.   Keep all follow-up appointments as directed by your health care provider. SEEK IMMEDIATE MEDICAL CARE IF:  You have worsening shortness of breath.   You have trouble talking.   You have severe chest pain.  You have blood in your sputum.  You have a fever.  You have weakness, vomit repeatedly, or faint.   You feel confused.   You continue to get worse. MAKE SURE YOU:   Understand these instructions.  Will watch your condition.  Will get help right away if you are not doing well or get worse. Document Released:  08/24/2007 Document Revised: 03/13/2014 Document Reviewed: 07/01/2013 [facility] Patient Information 2015 [place], [place]. This information is not intended to replace advice given to you by your health care provider. Make sure you discuss any questions you have with your  health care provider.   Upper Respiratory Infection, Adult An upper respiratory infection (URI) is also sometimes known as the common cold. The upper respiratory tract includes the nose, sinuses, throat, trachea, and bronchi. Bronchi are the airways leading to the lungs. Most people improve within 1 week, but symptoms can last up to 2 weeks. A residual cough may last even longer.  CAUSES Many different viruses can infect the tissues lining the upper respiratory tract. The tissues become irritated and inflamed and often become very moist. Mucus production is also common. A cold is contagious. You can easily spread the virus to others by oral contact. This includes kissing, sharing a glass, coughing, or sneezing. Touching your mouth or nose and then touching a surface, which is then touched by another person, can also spread the virus. SYMPTOMS  Symptoms typically develop 1 to 3 days after you come in contact with a cold virus. Symptoms vary from person to person. They may include:  Runny nose.  Sneezing.  Nasal congestion.  Sinus irritation.  Sore throat.  Loss of voice (laryngitis).  Cough.  Fatigue.  Muscle aches.  Loss of appetite.  Headache.  Low-grade fever. DIAGNOSIS  You might diagnose your own cold based on familiar symptoms, since most people get a cold 2 to 3 times a year. Your caregiver can confirm this based on your exam. Most importantly, your caregiver can check that your symptoms are not due to another disease such as strep throat, sinusitis, pneumonia, asthma, or epiglottitis. Blood tests, throat tests, and X-rays are not necessary to diagnose a common cold, but they may sometimes be helpful in excluding other more serious diseases. Your caregiver will decide if any further tests are required. RISKS AND COMPLICATIONS  You may be at risk for a more severe case of the common cold if you smoke cigarettes, have chronic heart disease (such as heart failure) or lung  disease (such as asthma), or if you have a weakened immune system. The very young and very old are also at risk for more serious infections. Bacterial sinusitis, middle ear infections, and bacterial pneumonia can complicate the common cold. The common cold can worsen asthma and chronic obstructive pulmonary disease (COPD). Sometimes, these complications can require emergency medical care and may be life-threatening. PREVENTION  The best way to protect against getting a cold is to practice good hygiene. Avoid oral or hand contact with people with cold symptoms. Wash your hands often if contact occurs. There is no clear evidence that vitamin C, vitamin E, echinacea, or exercise reduces the chance of developing a cold. However, it is always recommended to get plenty of rest and practice good nutrition. TREATMENT  Treatment is directed at relieving symptoms. There is no cure. Antibiotics are not effective, because the infection is caused by a virus, not by bacteria. Treatment may include:  Increased fluid intake. Sports drinks offer valuable electrolytes, sugars, and fluids.  Breathing heated mist or steam (vaporizer or shower).  Eating chicken soup or other clear broths, and maintaining good nutrition.  Getting plenty of rest.  Using gargles or lozenges for comfort.  Controlling fevers with ibuprofen or acetaminophen as directed by your caregiver.  Increasing usage of your inhaler if you have asthma. Zinc gel and zinc lozenges, taken  in the first 24 hours of the common cold, can shorten the duration and lessen the severity of symptoms. Pain medicines may help with fever, muscle aches, and throat pain. A variety of non-prescription medicines are available to treat congestion and runny nose. Your caregiver can make recommendations and may suggest nasal or lung inhalers for other symptoms.  HOME CARE INSTRUCTIONS   Only take over-the-counter or prescription medicines for pain, discomfort, or fever as  directed by your caregiver.  Use a warm mist humidifier or inhale steam from a shower to increase air moisture. This may keep secretions moist and make it easier to breathe.  Drink enough water and fluids to keep your urine clear or pale yellow.  Rest as needed.  Return to work when your temperature has returned to normal or as your caregiver advises. You may need to stay home longer to avoid infecting others. You can also use a face mask and careful hand washing to prevent spread of the virus. SEEK MEDICAL CARE IF:   After the first few days, you feel you are getting worse rather than better.  You need your caregiver's advice about medicines to control symptoms.  You develop chills, worsening shortness of breath, or brown or red sputum. These may be signs of pneumonia.  You develop yellow or brown nasal discharge or pain in the face, especially when you bend forward. These may be signs of sinusitis.  You develop a fever, swollen neck glands, pain with swallowing, or white areas in the back of your throat. These may be signs of strep throat. SEEK IMMEDIATE MEDICAL CARE IF:   You have a fever.  You develop severe or persistent headache, ear pain, sinus pain, or chest pain.  You develop wheezing, a prolonged cough, cough up blood, or have a change in your usual mucus (if you have chronic lung disease).  You develop sore muscles or a stiff neck. Document Released: 04/22/2001 Document Revised: 01/19/2012 Document Reviewed: 02/01/2014 [facility] Patient Information 2015 [place], [place]. This information is not intended to replace advice given to you by your health care provider. Make sure you discuss any questions you have with your health care provider.

## 2015-01-17 NOTE — ED Notes (Signed)
Pt. Given drink and crackers. Pt. Requesting usual dose of Xanax, EDP notified.

## 2015-01-17 NOTE — ED Notes (Signed)
Pt. Ambulated in hallway on 2L [place]. Pt. Maintained O2 greater than 94%. EDP notified. Pt. Given bag and encouraged to cough up any sputum.

## 2015-01-19 ENCOUNTER — Other Ambulatory Visit: Payer: Self-pay | Admitting: *Deleted

## 2015-01-19 MED ORDER — APIXABAN 5 MG PO TABS: 5.0000 mg | ORAL_TABLET | Freq: Two times a day (BID) | ORAL | Status: DC

## 2015-02-15 ENCOUNTER — Emergency Department (HOSPITAL_COMMUNITY)
Admission: EM | Admit: 2015-02-15 | Discharge: 2015-02-15 | Disposition: A | Discharge status: Home / Self Care | Payer: Medicare Other | Attending: Emergency Medicine | Admitting: Emergency Medicine

## 2015-02-15 ENCOUNTER — Encounter (HOSPITAL_COMMUNITY): Payer: Self-pay

## 2015-02-15 ENCOUNTER — Emergency Department (HOSPITAL_COMMUNITY): Payer: Medicare Other

## 2015-02-15 DIAGNOSIS — Z792 Long term (current) use of antibiotics: Secondary | ICD-10-CM | POA: Diagnosis not present

## 2015-02-15 DIAGNOSIS — Z923 Personal history of irradiation: Secondary | ICD-10-CM | POA: Diagnosis not present

## 2015-02-15 DIAGNOSIS — J441 Chronic obstructive pulmonary disease with (acute) exacerbation: Secondary | ICD-10-CM | POA: Diagnosis not present

## 2015-02-15 DIAGNOSIS — Z87438 Personal history of other diseases of male genital organs: Secondary | ICD-10-CM | POA: Diagnosis not present

## 2015-02-15 DIAGNOSIS — I4891 Unspecified atrial fibrillation: Secondary | ICD-10-CM | POA: Diagnosis not present

## 2015-02-15 DIAGNOSIS — I2510 Atherosclerotic heart disease of native coronary artery without angina pectoris: Secondary | ICD-10-CM | POA: Diagnosis not present

## 2015-02-15 DIAGNOSIS — Z79899 Other long term (current) drug therapy: Secondary | ICD-10-CM | POA: Insufficient documentation

## 2015-02-15 DIAGNOSIS — Z87891 Personal history of nicotine dependence: Secondary | ICD-10-CM | POA: Diagnosis not present

## 2015-02-15 DIAGNOSIS — I252 Old myocardial infarction: Secondary | ICD-10-CM | POA: Diagnosis not present

## 2015-02-15 DIAGNOSIS — E785 Hyperlipidemia, unspecified: Secondary | ICD-10-CM | POA: Insufficient documentation

## 2015-02-15 DIAGNOSIS — K219 Gastro-esophageal reflux disease without esophagitis: Secondary | ICD-10-CM | POA: Insufficient documentation

## 2015-02-15 DIAGNOSIS — Z8546 Personal history of malignant neoplasm of prostate: Secondary | ICD-10-CM | POA: Diagnosis not present

## 2015-02-15 DIAGNOSIS — R079 Chest pain, unspecified: Secondary | ICD-10-CM

## 2015-02-15 LAB — CBC WITH DIFFERENTIAL/PLATELET
BASOS PCT: 0 % (ref 0–1)
Basophils Absolute: 0 10*3/uL (ref 0.0–0.1)
EOS ABS: 0.2 10*3/uL (ref 0.0–0.7)
EOS PCT: 3 % (ref 0–5)
HCT: 45.7 % (ref 39.0–52.0)
Hemoglobin: 14.6 g/dL (ref 13.0–17.0)
LYMPHS ABS: 2.2 10*3/uL (ref 0.7–4.0)
Lymphocytes Relative: 27 % (ref 12–46)
MCH: 29.4 pg (ref 26.0–34.0)
MCHC: 31.9 g/dL (ref 30.0–36.0)
MCV: 92.1 fL (ref 78.0–100.0)
Monocytes Absolute: 1.1 10*3/uL — ABNORMAL HIGH (ref 0.1–1.0)
Monocytes Relative: 14 % — ABNORMAL HIGH (ref 3–12)
NEUTROS PCT: 56 % (ref 43–77)
Neutro Abs: 4.6 10*3/uL (ref 1.7–7.7)
PLATELETS: 207 10*3/uL (ref 150–400)
RBC: 4.96 MIL/uL (ref 4.22–5.81)
RDW: 13.9 % (ref 11.5–15.5)
WBC: 8.1 10*3/uL (ref 4.0–10.5)

## 2015-02-15 LAB — BASIC METABOLIC PANEL
ANION GAP: 8 (ref 5–15)
BUN: 11 mg/dL (ref 6–23)
CALCIUM: 8.8 mg/dL (ref 8.4–10.5)
CO2: 26 mmol/L (ref 19–32)
Chloride: 105 mmol/L (ref 96–112)
Creatinine, Ser: 0.75 mg/dL (ref 0.50–1.35)
GFR calc Af Amer: >90 mL/min (ref >90)
GFR calc non Af Amer: >90 mL/min (ref >90)
Glucose, Bld: 112 mg/dL — ABNORMAL HIGH (ref 70–99)
Potassium: 4.4 mmol/L (ref 3.5–5.1)
Sodium: 139 mmol/L (ref 135–145)

## 2015-02-15 LAB — TROPONIN I

## 2015-02-15 MED ORDER — PREDNISONE 50 MG PO TABS: ORAL_TABLET | ORAL | Status: DC

## 2015-02-15 MED ORDER — IPRATROPIUM-ALBUTEROL 0.5-2.5 (3) MG/3ML IN SOLN
3.0000 mL | Freq: Once | RESPIRATORY_TRACT | Status: AC
  Administered 2015-02-15: 3 mL via RESPIRATORY_TRACT
  Filled 2015-02-15: qty 3

## 2015-02-15 NOTE — Discharge Instructions (Signed)
Tests were good. Will start prednisone. Follow-up your primary care doctor.

## 2015-02-15 NOTE — ED Notes (Signed)
Resting, eyes closed. Respirations even and unlabored.

## 2015-02-15 NOTE — ED Provider Notes (Addendum)
CSN: [id number]     Arrival date & time 02/15/15  1105 History  This chart was scribed for [name], MD by Donato Schultz, ED Scribe. This patient was seen in room APA02/APA02 and the patient's care was started at 11:29 AM.    Chief Complaint  Patient presents with  . Chest Pain   Patient is a 64 y.o. male presenting with chest pain. The history is provided by the patient. No language interpreter was used.  Chest Pain Associated symptoms: no diaphoresis, no nausea and no shortness of breath    HPI Comments: JAIDEV SANGER is a 64 y.o. male with a history of A-fib who presents to the Emergency Department complaining of constant, sharp left sided chest pain that started this morning during his nebulizer treatment at home.  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  He saw Dr. Ron Parker at one point for treatment of his MIs.  He denies any new SOB, nausea, and diaphoresis as associated symptoms.  He quit smoking 4 years ago.  His PAP is Dr. Pattricia Boss.    Past Medical History  Diagnosis Date  . GERD (gastroesophageal reflux disease)   . COPD (chronic obstructive pulmonary disease)     severe by PFTs August 2010  . 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 Atrial fibrillation     post-op a-fib. 3/11. post Aortobifem , /  Atrial fibrillation from nebulizer treatment  May, 2012, while hospitalized  . Renal artery stenosis      presumably corrected at time of aortobifem surgery March, 2011  . Tobacco user      stop smoking  . Pneumothorax      2011  before aortobifemoral surgery,  resolved  . Peripheral vascular disease, unspecified     bilateral intermittent claudication  . CAD (coronary artery disease), native coronary artery     catheterization 06/2009.Marland KitchenMarland Kitchen70% proximal LAD and 30%  proximal RCA. normal LV function, no intervention planned prior to surgery for aorta. LV normal function  by catheter 8/10.   [name] Dyslipidemia   . DJD (degenerative joint disease)   . Ejection fraction     60%, echo, May, 2012, rapid atrial fib at the time  . Cancer 2013    Prostate Radiation  . Myocardial infarction 2011   Past Surgical History  Procedure Laterality Date  . Abdominal aortic aneurysm repair  01/11/2010  . Pr vein bypass graft,aorto-fem-pop    . Inguinal hernia repair      RIGHT   Family History  Problem Relation Age of Onset  . Cancer Father     prostate   History  Substance Use Topics  . Smoking status: Former Smoker -- 2.00 packs/day for 40 years    Types: Cigarettes    Quit date: 01/11/2010  . Smokeless tobacco: Never Used  . Alcohol Use: No    Review of Systems  Constitutional: Negative for diaphoresis.  Respiratory: Negative for shortness of breath.   Cardiovascular: Positive for chest pain.  Gastrointestinal: Negative for nausea.  All other systems reviewed and are negative.   Allergies  Review of patient's allergies indicates no known allergies.  Home Medications   Prior to Admission medications   Medication Sig Start Date  End Date Taking? Authorizing Provider  albuterol (PROVENTIL HFA;VENTOLIN HFA) 108 (90 BASE) MCG/ACT inhaler Inhale 2 puffs into the lungs every 4 (four) hours as needed for wheezing or shortness of breath. 01/17/15  Yes Kristen N Ward, DO  albuterol (PROVENTIL) (2.5 MG/3ML) 0.083% nebulizer solution Take 2.5 mg by nebulization 3 (three) times daily as needed for wheezing or shortness of breath.   Yes Historical Provider, MD  apixaban (ELIQUIS) 5 MG TABS tablet Take 1 tablet (5 mg total) by mouth 2 (two) times daily. 01/19/15  Yes [name], MD  gabapentin (NEURONTIN) 300 MG capsule Take 300 mg by mouth 3 (three) times daily. 02/13/14  Yes Historical Provider, MD  HYDROcodone-acetaminophen (NORCO/VICODIN) 5-325 MG per tablet  Take 1 tablet by mouth every 6 (six) hours as needed for moderate pain.   Yes Historical Provider, MD  metoprolol (LOPRESSOR) 50 MG tablet Take 50 mg by mouth 2 (two) times daily. 12/27/13  Yes Historical Provider, MD  nitroGLYCERIN (NITROSTAT) 0.4 MG SL tablet Place 1 tablet (0.4 mg total) under the tongue every 5 (five) minutes as needed. 09/20/12  Yes [name], MD  omeprazole (PRILOSEC) 20 MG capsule Take 1 capsule by mouth every morning.  04/21/11  Yes Historical Provider, MD  simvastatin (ZOCOR) 40 MG tablet Take 1 tablet (40 mg total) by mouth every evening. 06/15/14  Yes [name], MD  guaiFENesin (ROBITUSSIN) 100 MG/5ML liquid Take 5-10 mLs (100-200 mg total) by mouth every 4 (four) hours as needed for cough. Patient not taking: Reported on 02/15/2015 01/17/15   [name], DO  levofloxacin (LEVAQUIN) 500 MG tablet Take 1 tablet (500 mg total) by mouth daily. Patient not taking: Reported on 02/15/2015 01/17/15   [name], DO  predniSONE (DELTASONE) 50 MG tablet 1 tablet daily for 4 days, one half tablet daily for 4 days 02/15/15   [name], MD   Triage Vitals: BP 147/105 mmHg  Pulse 86  Temp(Src) 98.6 F (37 C) (Oral)  Resp 25  Ht 5\' 4"  (1.626 m)  Wt 140 lb (63.504 kg)  BMI 24.02 kg/m2  SpO2 90%  Physical Exam  Constitutional: He is oriented to person, place, and time. He appears well-developed and well-nourished.  HENT:  Head: Normocephalic and atraumatic.  Eyes: Conjunctivae and EOM are normal. Pupils are equal, round, and reactive to light.  Neck: Normal range of motion. Neck supple.  Cardiovascular: Normal rate and regular rhythm.   Pulmonary/Chest: Effort normal. He has wheezes.  Minimal bilateral wheezing.  Abdominal: Soft. Bowel sounds are normal.  Musculoskeletal: Normal range of motion.  Neurological: He is alert and oriented to person, place, and time.  Skin: Skin is warm and dry.  Psychiatric: He has a normal mood and affect. His behavior is normal.   Nursing note and vitals reviewed.   ED Course  Procedures (including critical care time)  DIAGNOSTIC STUDIES: Oxygen Saturation is 90% on room air, low by my interpretation.    COORDINATION OF CARE: 11:30 AM- Will administer a nebulizer treatment in the ED.  The patient agreed to the treatment plan.   Labs Review Labs Reviewed  CBC WITH DIFFERENTIAL/PLATELET - Abnormal; Notable for the following:    Monocytes Relative 14 (*)    Monocytes Absolute 1.1 (*)    All other components within normal limits  BASIC METABOLIC PANEL - Abnormal; Notable for the following:    Glucose, Bld 112 (*)    All other components within normal limits  TROPONIN  I    Imaging Review Dg Chest Port 1 View  02/15/2015   CLINICAL DATA:  Left-sided chest pain. Sharp in have the chest pain onset of symptoms at 8 a.m. Short of breath.  EXAM: PORTABLE CHEST - 1 VIEW  COMPARISON:  None.  FINDINGS: Emphysema. Cardiopericardial silhouette within normal limits. Mediastinal contours normal. Trachea midline. No airspace disease or effusion. Bilateral pleural apical scarring. Monitoring leads project over the chest.  IMPRESSION: Emphysema without acute cardiopulmonary disease.   Electronically Signed   By: Dereck Ligas M.D.   On: 02/15/2015 13:00     EKG Interpretation   Date/Time:  Thursday February 15 2015 11:13:24 EDT Ventricular Rate:  85 PR Interval:  135 QRS Duration: 86 QT Interval:  359 QTC Calculation: 427 R Axis:   54 Text Interpretation:  Sinus rhythm RSR' in V1 or V2, right VCD or RVH  Confirmed by Manly Nestle  MD, Shenna Brissette ([id number]) on 02/15/2015 12:03:13 PM      MDM   Final diagnoses:  Chest pain    Patient is hemodynamically stable. Labs, EKG, chest x-ray, troponin all negative. Patient feels better after breathing treatment. He feels back to normal at discharge  I personally performed the services described in this documentation, which was scribed in my presence. The recorded information has been  reviewed and is accurate.    [name], MD 02/15/15 [place], MD 02/15/15 [id number]

## 2015-02-15 NOTE — ED Notes (Signed)
Pt became more SOB while moving around getting dressed.  EDP notified, pt did not want to be admitted.  Dr. Lacinda Axon spoke with pt.  And pt wanting to go home.  Dr. Lacinda Axon instructed pt to do a breathing treatment when he gets home and to wear his oxygen today.  PT verbalized understanding.

## 2015-02-15 NOTE — ED Notes (Signed)
Post neb, patient states breathing has improved. Expiratory Wheezing hear during auscultation with stethoscope, prior to breathing treatment, wheezing was audible without assistance.

## 2015-02-15 NOTE — ED Notes (Signed)
Patient presents c/o of left sided chest pain which he describes as "sharp and heavy" which began around 0800 this am. Pt also c/o of SOB which started around the same time. States he took half a breathing treatment, but his chest pain worsened so he came to ED. Patient has audible expiratory wheezes, increased RR, increased WOB and speaking in broken sentences. Patient brought straight back to room 2, SPO2 91% on RA.

## 2015-02-15 NOTE — ED Notes (Signed)
Patient placed on 2 Liter [place] stats now 99-100%

## 2015-03-13 ENCOUNTER — Emergency Department (HOSPITAL_COMMUNITY): Payer: Medicare Other

## 2015-03-13 ENCOUNTER — Encounter (HOSPITAL_COMMUNITY): Payer: Self-pay | Admitting: *Deleted

## 2015-03-13 ENCOUNTER — Inpatient Hospital Stay (HOSPITAL_COMMUNITY)
Admission: EM | Admit: 2015-03-13 | Discharge: 2015-03-15 | DRG: 191 | Disposition: A | Discharge status: Home / Self Care | Payer: Medicare Other | Attending: Internal Medicine | Admitting: Internal Medicine

## 2015-03-13 DIAGNOSIS — Z9981 Dependence on supplemental oxygen: Secondary | ICD-10-CM

## 2015-03-13 DIAGNOSIS — I480 Paroxysmal atrial fibrillation: Secondary | ICD-10-CM | POA: Diagnosis present

## 2015-03-13 DIAGNOSIS — R0600 Dyspnea, unspecified: Secondary | ICD-10-CM

## 2015-03-13 DIAGNOSIS — M1990 Unspecified osteoarthritis, unspecified site: Secondary | ICD-10-CM | POA: Diagnosis present

## 2015-03-13 DIAGNOSIS — Z8546 Personal history of malignant neoplasm of prostate: Secondary | ICD-10-CM

## 2015-03-13 DIAGNOSIS — Z7901 Long term (current) use of anticoagulants: Secondary | ICD-10-CM

## 2015-03-13 DIAGNOSIS — I2510 Atherosclerotic heart disease of native coronary artery without angina pectoris: Secondary | ICD-10-CM | POA: Diagnosis present

## 2015-03-13 DIAGNOSIS — Z9229 Personal history of other drug therapy: Secondary | ICD-10-CM

## 2015-03-13 DIAGNOSIS — I252 Old myocardial infarction: Secondary | ICD-10-CM

## 2015-03-13 DIAGNOSIS — K219 Gastro-esophageal reflux disease without esophagitis: Secondary | ICD-10-CM | POA: Diagnosis present

## 2015-03-13 DIAGNOSIS — Z87891 Personal history of nicotine dependence: Secondary | ICD-10-CM

## 2015-03-13 DIAGNOSIS — J9611 Chronic respiratory failure with hypoxia: Secondary | ICD-10-CM | POA: Diagnosis present

## 2015-03-13 DIAGNOSIS — J441 Chronic obstructive pulmonary disease with (acute) exacerbation: Secondary | ICD-10-CM | POA: Diagnosis not present

## 2015-03-13 DIAGNOSIS — R0602 Shortness of breath: Secondary | ICD-10-CM | POA: Diagnosis not present

## 2015-03-13 DIAGNOSIS — Z7952 Long term (current) use of systemic steroids: Secondary | ICD-10-CM

## 2015-03-13 DIAGNOSIS — E785 Hyperlipidemia, unspecified: Secondary | ICD-10-CM | POA: Diagnosis present

## 2015-03-13 LAB — BASIC METABOLIC PANEL
Anion gap: 5 (ref 5–15)
BUN: 9 mg/dL (ref 6–20)
CALCIUM: 8.6 mg/dL — AB (ref 8.9–10.3)
CO2: 29 mmol/L (ref 22–32)
Chloride: 106 mmol/L (ref 101–111)
Creatinine, Ser: 0.75 mg/dL (ref 0.61–1.24)
GFR calc Af Amer: >60 mL/min (ref >60)
Glucose, Bld: 101 mg/dL — ABNORMAL HIGH (ref 70–99)
Potassium: 4.2 mmol/L (ref 3.5–5.1)
SODIUM: 140 mmol/L (ref 135–145)

## 2015-03-13 LAB — CBC WITH DIFFERENTIAL/PLATELET
BASOS ABS: 0 10*3/uL (ref 0.0–0.1)
Basophils Relative: 0 % (ref 0–1)
Eosinophils Absolute: 0.2 10*3/uL (ref 0.0–0.7)
Eosinophils Relative: 2 % (ref 0–5)
HEMATOCRIT: 42.2 % (ref 39.0–52.0)
Hemoglobin: 13.3 g/dL (ref 13.0–17.0)
Lymphocytes Relative: 18 % (ref 12–46)
Lymphs Abs: 2 10*3/uL (ref 0.7–4.0)
MCH: 29.2 pg (ref 26.0–34.0)
MCHC: 31.5 g/dL (ref 30.0–36.0)
MCV: 92.5 fL (ref 78.0–100.0)
MONO ABS: 1.6 10*3/uL — AB (ref 0.1–1.0)
Monocytes Relative: 15 % — ABNORMAL HIGH (ref 3–12)
NEUTROS PCT: 65 % (ref 43–77)
Neutro Abs: 7.1 10*3/uL (ref 1.7–7.7)
Platelets: 240 10*3/uL (ref 150–400)
RBC: 4.56 MIL/uL (ref 4.22–5.81)
RDW: 13.9 % (ref 11.5–15.5)
WBC: 11 10*3/uL — ABNORMAL HIGH (ref 4.0–10.5)

## 2015-03-13 LAB — TROPONIN I

## 2015-03-13 MED ORDER — ALBUTEROL (5 MG/ML) CONTINUOUS INHALATION SOLN
15.0000 mg/h | INHALATION_SOLUTION | RESPIRATORY_TRACT | Status: DC
Start: 2015-03-13 — End: 2015-03-15
  Administered 2015-03-13: 15 mg/h via RESPIRATORY_TRACT
  Filled 2015-03-13: qty 20

## 2015-03-13 MED ORDER — LEVOFLOXACIN IN D5W 750 MG/150ML IV SOLN
750.0000 mg | Freq: Once | INTRAVENOUS | Status: AC
  Administered 2015-03-13: 750 mg via INTRAVENOUS
  Filled 2015-03-13: qty 150

## 2015-03-13 MED ORDER — METHYLPREDNISOLONE SODIUM SUCC 125 MG IJ SOLR
125.0000 mg | Freq: Once | INTRAMUSCULAR | Status: AC
  Administered 2015-03-13: 125 mg via INTRAVENOUS
  Filled 2015-03-13: qty 2

## 2015-03-13 MED ORDER — IPRATROPIUM BROMIDE 0.02 % IN SOLN
0.5000 mg | Freq: Once | RESPIRATORY_TRACT | Status: AC
  Administered 2015-03-13: 0.5 mg via RESPIRATORY_TRACT
  Filled 2015-03-13: qty 2.5

## 2015-03-13 MED ORDER — IPRATROPIUM-ALBUTEROL 0.5-2.5 (3) MG/3ML IN SOLN
3.0000 mL | Freq: Once | RESPIRATORY_TRACT | Status: AC
  Administered 2015-03-13: 3 mL via RESPIRATORY_TRACT
  Filled 2015-03-13: qty 3

## 2015-03-13 MED ORDER — MAGNESIUM SULFATE 2 GM/50ML IV SOLN
2.0000 g | Freq: Once | INTRAVENOUS | Status: AC
  Administered 2015-03-13: 2 g via INTRAVENOUS
  Filled 2015-03-13: qty 50

## 2015-03-13 MED ORDER — ALBUTEROL (5 MG/ML) CONTINUOUS INHALATION SOLN
10.0000 mg/h | INHALATION_SOLUTION | Freq: Once | RESPIRATORY_TRACT | Status: DC
  Filled 2015-03-13: qty 20

## 2015-03-13 NOTE — ED Provider Notes (Signed)
CSN: [id number]     Arrival date & time 03/13/15  2129 History  This chart was scribed for Kenneth Morrison, MD by Molli Posey, ED Scribe. This patient was seen in room APA04/APA04 and the patient's care was started 10:47 PM.  Chief Complaint  Patient presents with  . Shortness of Breath   The history is provided by the patient. No language interpreter was used.   HPI Comments: Kenneth Merritt is a 64 y.o. male with a history of CAD, MI, COPD and a-fib who presents to the Emergency Department complaining of gradually worsening SOB for the last couple of days. Pt reports associated wheezing, chest tightness and cough. Pt states moving and certain positions aggravates his SOB. He denies any hx of PE. Pt reports no recent surgeries or travel. He denies abdominal pain, HA, diarrhea, CP or visual changes.   Past Medical History  Diagnosis Date  . GERD (gastroesophageal reflux disease)   . COPD (chronic obstructive pulmonary disease)     severe by PFTs August 2010  . 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 Atrial fibrillation     post-op a-fib. 3/11. post Aortobifem , /  Atrial fibrillation from nebulizer treatment  May, 2012, while hospitalized  . Renal artery stenosis      presumably corrected at time of aortobifem surgery March, 2011  . Tobacco user      stop smoking  . Pneumothorax      2011  before aortobifemoral surgery,  resolved  . Peripheral vascular disease, unspecified     bilateral intermittent claudication  . CAD (coronary artery disease), native coronary artery     catheterization 06/2009.Kenneth KitchenMarland Kitchen70% proximal LAD and 30% proximal RCA. normal LV function, no intervention planned prior to surgery for aorta. LV normal function  by catheter 8/10.   Kenneth Merritt Dyslipidemia   . DJD (degenerative joint disease)   . Ejection fraction     60%, echo, May, 2012, rapid atrial fib at the time  . Cancer 2013     Prostate Radiation  . Myocardial infarction 2011   Past Surgical History  Procedure Laterality Date  . Abdominal aortic aneurysm repair  01/11/2010  . Pr vein bypass graft,aorto-fem-pop    . Inguinal hernia repair      RIGHT   Family History  Problem Relation Age of Onset  . Cancer Father     prostate   History  Substance Use Topics  . Smoking status: Former Smoker -- 2.00 packs/day for 40 years    Types: Cigarettes    Quit date: 01/11/2010  . Smokeless tobacco: Never Used  . Alcohol Use: No    Review of Systems  Eyes: Negative for visual disturbance.  Respiratory: Positive for cough, chest tightness, shortness of breath and wheezing.   Gastrointestinal: Negative for abdominal pain and diarrhea.  Neurological: Negative for headaches.  All other systems reviewed and are negative.     Allergies  Review of patient's allergies indicates no known allergies.  Home Medications   Prior to Admission medications   Medication Sig Start Date End Date Taking? Authorizing Provider  albuterol (PROVENTIL HFA;VENTOLIN HFA) 108 (90 BASE) MCG/ACT inhaler Inhale 2 puffs into the lungs every 4 (four) hours as needed for wheezing or shortness of breath. 01/17/15  Yes Kristen N Ward, DO  albuterol (PROVENTIL) (2.5 MG/3ML) 0.083% nebulizer solution Take 2.5 mg by nebulization 3 (  three) times daily as needed for wheezing or shortness of breath.   Yes Historical Provider, MD  apixaban (ELIQUIS) 5 MG TABS tablet Take 1 tablet (5 mg total) by mouth 2 (two) times daily. 01/19/15  Yes [name], MD  gabapentin (NEURONTIN) 300 MG capsule Take 300 mg by mouth 3 (three) times daily. 02/13/14  Yes Historical Provider, MD  HYDROcodone-acetaminophen (NORCO/VICODIN) 5-325 MG per tablet Take 1 tablet by mouth every 6 (six) hours as needed for moderate pain.   Yes Historical Provider, MD  metoprolol (LOPRESSOR) 50 MG tablet Take 50 mg by mouth 2 (two) times daily. 12/27/13  Yes Historical Provider, MD   nitroGLYCERIN (NITROSTAT) 0.4 MG SL tablet Place 1 tablet (0.4 mg total) under the tongue every 5 (five) minutes as needed. 09/20/12  Yes [name], MD  omeprazole (PRILOSEC) 20 MG capsule Take 1 capsule by mouth every morning.  04/21/11  Yes Historical Provider, MD  simvastatin (ZOCOR) 40 MG tablet Take 1 tablet (40 mg total) by mouth every evening. 06/15/14  Yes [name], MD  guaiFENesin (ROBITUSSIN) 100 MG/5ML liquid Take 5-10 mLs (100-200 mg total) by mouth every 4 (four) hours as needed for cough. Patient not taking: Reported on 02/15/2015 01/17/15   [name], DO  levofloxacin (LEVAQUIN) 500 MG tablet Take 1 tablet (500 mg total) by mouth daily. Patient not taking: Reported on 02/15/2015 01/17/15   [name], DO  predniSONE (DELTASONE) 50 MG tablet 1 tablet daily for 4 days, one half tablet daily for 4 days Patient not taking: Reported on 03/13/2015 02/15/15   [name], MD   BP 95/64 mmHg  Pulse 102  Temp(Src) 97.6 F (36.4 C) (Oral)  Resp 20  SpO2 99% Physical Exam  Constitutional: He is oriented to person, place, and time. He appears well-developed and well-nourished.  HENT:  Head: Normocephalic and atraumatic.  Dry mucous membranes.   Eyes: Pupils are equal, round, and reactive to light. Right eye exhibits no discharge. Left eye exhibits no discharge.  Neck: Neck supple. No tracheal deviation present.  Cardiovascular: Normal heart sounds.  Tachycardia present.   Difficulty appreciating heart sounds due to work of breathing.  Pulmonary/Chest: He has wheezes.  Decreased air movement bilaterally. Significant increased work of breathing. Expiratory wheeze  Abdominal: He exhibits no distension.  Mild distension, abdominal breathing.   Neurological: He is alert and oriented to person, place, and time.  Skin: Skin is warm and dry.  Psychiatric: He has a normal mood and affect.  Nursing note and vitals reviewed.   ED Course  Procedures   DIAGNOSTIC STUDIES: Oxygen  Saturation is 98% on breathing treatment, normal by my interpretation.    COORDINATION OF CARE: 10:52 PM Discussed treatment plan with pt at bedside and pt agreed to plan. continuous neb, bloodwork, steroids and ABG. Plan for admission.    Labs Review Labs Reviewed  BASIC METABOLIC PANEL - Abnormal; Notable for the following:    Glucose, Bld 101 (*)    Calcium 8.6 (*)    All other components within normal limits  CBC WITH DIFFERENTIAL/PLATELET - Abnormal; Notable for the following:    WBC 11.0 (*)    Monocytes Relative 15 (*)    Monocytes Absolute 1.6 (*)    All other components within normal limits  BLOOD GAS, ARTERIAL - Abnormal; Notable for the following:    pCO2 arterial 46.6 (*)    pO2, Arterial 107 (*)    Bicarbonate 27.0 (*)  Acid-Base Excess 2.4 (*)    All other components within normal limits  TROPONIN I    Imaging Review Dg Chest Portable 1 View  03/13/2015   CLINICAL DATA:  Increasing shortness of breath and wheezing for 2 days, gasping for breath, history prostate cancer, COPD, coronary artery disease post MI, smoking  EXAM: PORTABLE CHEST - 1 VIEW  COMPARISON:  Portable exam 2155 hr compared to 02/15/2015  FINDINGS: Normal heart size, mediastinal contours and pulmonary vascularity.  Emphysematous changes consistent with COPD.  Stable biapical scarring RIGHT greater than LEFT and slight chronic accentuation of basilar interstitial markings.  No acute infiltrate, pleural effusion or pneumothorax.  Bones unremarkable.  IMPRESSION: COPD with biapical scarring.  No acute abnormalities.   Electronically Signed   By: Lavonia Dana M.D.   On: 03/13/2015 22:09   X-ray reviewed: consistent with COPD.    EKG Interpretation None      MDM   Final diagnoses:  COPD with acute exacerbation  Acute dyspnea   I personally performed the services described in this documentation, which was scribed in my presence. The recorded information has been reviewed and is accurate.  Patient  presents with clinical concern for acute COPD exacerbation. With work of breathing and persistent symptoms in the ER plan for admission. Discussed with triad hospitalist.  The patients results and plan were reviewed and discussed.   Any x-rays performed were personally reviewed by myself.   Differential diagnosis were considered with the presenting HPI.  Medications  albuterol (PROVENTIL,VENTOLIN) solution continuous neb (15 mg/hr Nebulization New Bag/Given 03/13/15 2201)  albuterol (PROVENTIL,VENTOLIN) solution continuous neb (not administered)  magnesium sulfate IVPB 2 g 50 mL (2 g Intravenous New Bag/Given 03/13/15 2337)  levofloxacin (LEVAQUIN) IVPB 750 mg (750 mg Intravenous New Bag/Given 03/13/15 2337)  ipratropium-albuterol (DUONEB) 0.5-2.5 (3) MG/3ML nebulizer solution 3 mL (3 mLs Nebulization Given 03/13/15 2149)  ipratropium (ATROVENT) nebulizer solution 0.5 mg (0.5 mg Nebulization Given 03/13/15 2201)  methylPREDNISolone sodium succinate (SOLU-MEDROL) 125 mg/2 mL injection 125 mg (125 mg Intravenous Given 03/13/15 2224)    Filed Vitals:   03/13/15 2150 03/13/15 2152 03/13/15 2205 03/14/15 0026  BP:    95/64  Pulse:    102  Temp:    97.6 F (36.4 C)  TempSrc:    Oral  Resp:    20  SpO2: 99% 99% 98% 99%    Final diagnoses:  COPD with acute exacerbation  Acute dyspnea    Admission/ observation were discussed with the admitting physician, patient and/or family and they are comfortable with the plan.     Kenneth Morrison, MD 03/14/15 [phone]

## 2015-03-13 NOTE — Progress Notes (Signed)
Pt refused second breathing treatment, MD notified

## 2015-03-13 NOTE — ED Notes (Signed)
Pt reporting increasing SOB for past couple days.  Audible wheezing noted in triage.

## 2015-03-14 ENCOUNTER — Encounter (HOSPITAL_COMMUNITY): Payer: Self-pay | Admitting: Internal Medicine

## 2015-03-14 DIAGNOSIS — R0602 Shortness of breath: Secondary | ICD-10-CM | POA: Diagnosis present

## 2015-03-14 DIAGNOSIS — Z7901 Long term (current) use of anticoagulants: Secondary | ICD-10-CM | POA: Diagnosis not present

## 2015-03-14 DIAGNOSIS — I2510 Atherosclerotic heart disease of native coronary artery without angina pectoris: Secondary | ICD-10-CM | POA: Diagnosis present

## 2015-03-14 DIAGNOSIS — Z8546 Personal history of malignant neoplasm of prostate: Secondary | ICD-10-CM | POA: Diagnosis not present

## 2015-03-14 DIAGNOSIS — E785 Hyperlipidemia, unspecified: Secondary | ICD-10-CM | POA: Diagnosis not present

## 2015-03-14 DIAGNOSIS — Z9981 Dependence on supplemental oxygen: Secondary | ICD-10-CM | POA: Diagnosis not present

## 2015-03-14 DIAGNOSIS — K219 Gastro-esophageal reflux disease without esophagitis: Secondary | ICD-10-CM

## 2015-03-14 DIAGNOSIS — Z7952 Long term (current) use of systemic steroids: Secondary | ICD-10-CM | POA: Diagnosis not present

## 2015-03-14 DIAGNOSIS — J9611 Chronic respiratory failure with hypoxia: Secondary | ICD-10-CM | POA: Diagnosis present

## 2015-03-14 DIAGNOSIS — Z87891 Personal history of nicotine dependence: Secondary | ICD-10-CM | POA: Diagnosis not present

## 2015-03-14 DIAGNOSIS — I252 Old myocardial infarction: Secondary | ICD-10-CM | POA: Diagnosis not present

## 2015-03-14 DIAGNOSIS — I480 Paroxysmal atrial fibrillation: Secondary | ICD-10-CM

## 2015-03-14 DIAGNOSIS — J441 Chronic obstructive pulmonary disease with (acute) exacerbation: Principal | ICD-10-CM

## 2015-03-14 DIAGNOSIS — M1990 Unspecified osteoarthritis, unspecified site: Secondary | ICD-10-CM | POA: Diagnosis present

## 2015-03-14 LAB — BLOOD GAS, ARTERIAL
Acid-Base Excess: 2.4 mmol/L — ABNORMAL HIGH (ref 0.0–2.0)
Bicarbonate: 27 mEq/L — ABNORMAL HIGH (ref 20.0–24.0)
Drawn by: 213101
O2 Content: 2 L/min
O2 SAT: 97.3 %
PATIENT TEMPERATURE: 37
TCO2: 24.1 mmol/L (ref 0–100)
pCO2 arterial: 46.6 mmHg — ABNORMAL HIGH (ref 35.0–45.0)
pH, Arterial: 7.382 (ref 7.350–7.450)
pO2, Arterial: 107 mmHg — ABNORMAL HIGH (ref 80.0–100.0)

## 2015-03-14 MED ORDER — METOPROLOL TARTRATE 50 MG PO TABS
50.0000 mg | ORAL_TABLET | Freq: Two times a day (BID) | ORAL | Status: DC
  Administered 2015-03-14 – 2015-03-15 (×3): 50 mg via ORAL
  Filled 2015-03-14 (×3): qty 1

## 2015-03-14 MED ORDER — LEVOFLOXACIN IN D5W 750 MG/150ML IV SOLN
750.0000 mg | INTRAVENOUS | Status: DC
  Administered 2015-03-14: 750 mg via INTRAVENOUS
  Filled 2015-03-14: qty 150

## 2015-03-14 MED ORDER — ALBUTEROL SULFATE (2.5 MG/3ML) 0.083% IN NEBU
2.5000 mg | INHALATION_SOLUTION | RESPIRATORY_TRACT | Status: DC | PRN
  Administered 2015-03-14 (×2): 2.5 mg via RESPIRATORY_TRACT
  Filled 2015-03-14 (×2): qty 3

## 2015-03-14 MED ORDER — SODIUM CHLORIDE 0.9 % IJ SOLN
3.0000 mL | Freq: Two times a day (BID) | INTRAMUSCULAR | Status: DC
  Administered 2015-03-14 – 2015-03-15 (×4): 3 mL via INTRAVENOUS

## 2015-03-14 MED ORDER — SIMVASTATIN 20 MG PO TABS
40.0000 mg | ORAL_TABLET | Freq: Every evening | ORAL | Status: DC
  Administered 2015-03-14: 40 mg via ORAL
  Filled 2015-03-14: qty 2

## 2015-03-14 MED ORDER — HYDROCODONE-ACETAMINOPHEN 5-325 MG PO TABS
1.0000 | ORAL_TABLET | Freq: Four times a day (QID) | ORAL | Status: DC | PRN
Start: 2015-03-14 — End: 2015-03-15
  Administered 2015-03-14 (×2): 1 via ORAL
  Filled 2015-03-14 (×2): qty 1

## 2015-03-14 MED ORDER — ONDANSETRON HCL 4 MG PO TABS: 4.0000 mg | ORAL_TABLET | Freq: Four times a day (QID) | ORAL | Status: DC | PRN

## 2015-03-14 MED ORDER — METHYLPREDNISOLONE SODIUM SUCC 125 MG IJ SOLR
60.0000 mg | Freq: Four times a day (QID) | INTRAMUSCULAR | Status: DC
  Administered 2015-03-14 – 2015-03-15 (×6): 60 mg via INTRAVENOUS
  Filled 2015-03-14 (×6): qty 2

## 2015-03-14 MED ORDER — APIXABAN 5 MG PO TABS
5.0000 mg | ORAL_TABLET | Freq: Two times a day (BID) | ORAL | Status: DC
Start: 2015-03-14 — End: 2015-03-15
  Administered 2015-03-14 – 2015-03-15 (×3): 5 mg via ORAL
  Filled 2015-03-14 (×3): qty 1

## 2015-03-14 MED ORDER — ONDANSETRON HCL 4 MG/2ML IJ SOLN
4.0000 mg | Freq: Four times a day (QID) | INTRAMUSCULAR | Status: DC | PRN
  Administered 2015-03-14 (×2): 4 mg via INTRAVENOUS
  Filled 2015-03-14 (×2): qty 2

## 2015-03-14 MED ORDER — ALBUTEROL SULFATE (2.5 MG/3ML) 0.083% IN NEBU
2.5000 mg | INHALATION_SOLUTION | Freq: Four times a day (QID) | RESPIRATORY_TRACT | Status: DC
  Administered 2015-03-14 – 2015-03-15 (×6): 2.5 mg via RESPIRATORY_TRACT
  Filled 2015-03-14 (×6): qty 3

## 2015-03-14 MED ORDER — GABAPENTIN 300 MG PO CAPS
300.0000 mg | ORAL_CAPSULE | Freq: Three times a day (TID) | ORAL | Status: DC
  Administered 2015-03-14 – 2015-03-15 (×3): 300 mg via ORAL
  Filled 2015-03-14 (×4): qty 1

## 2015-03-14 MED ORDER — PANTOPRAZOLE SODIUM 40 MG PO TBEC
40.0000 mg | DELAYED_RELEASE_TABLET | Freq: Every day | ORAL | Status: DC
  Administered 2015-03-14 – 2015-03-15 (×2): 40 mg via ORAL
  Filled 2015-03-14 (×2): qty 1

## 2015-03-14 NOTE — Progress Notes (Signed)
Patient admitted to the hospital earlier this morning by Dr. Marin Comment.  Patient seen and examined. He is feeling significantly improved. Diminished breath sounds without any significant wheezing bilaterally. Exam is otherwise unremarkable.  Patient is admitted to the hospital for COPD exacerbation. Patient started on intravenous steroids and bronchodilators. Continue current treatments. Anticipate discharge in the next 24 hours if he continues to improve.  Kenneth Merritt

## 2015-03-14 NOTE — H&P (Signed)
[facility] History and Physical  Kenneth Merritt JZP:[id number] DOB: October 23, 1951    PCP:   Neale Burly, MD   Chief Complaint: increased SOB for the past 2 days.   HPI: Kenneth Merritt is an 64 y.o. male with hx COPD on home oxygen and chronic steroids, afib on anticoagulation, hx of GERD, PVD s/p bypass, RAS, prstate cancer, CAD, presented to the ER with 2 days history of worsening baseline SOB.  He also endorsed yellow productive coughs, but no fever or chills.  Evalaution in the ER showed CXR with no PNA, WBC of 11K, with normal renal fx tests.  He was given neb Tx and IV steroids, along with IV antibiotics, and hospitalist was asked to admit him for COPD exacerbation.     Rewiew of Systems:  Constitutional: Negative for malaise, fever and chills. No significant weight loss or weight gain Eyes: Negative for eye pain, redness and discharge, diplopia, visual changes, or flashes of light. ENMT: Negative for ear pain, hoarseness, nasal congestion, sinus pressure and sore throat. No headaches; tinnitus, drooling, or problem swallowing. Cardiovascular: Negative for chest pain, palpitations, diaphoresis,  and peripheral edema. ; No orthopnea, PND Respiratory: Negative for  hemoptysis, wheezing and stridor. No pleuritic chestpain. Gastrointestinal: Negative for nausea, vomiting, diarrhea, constipation, abdominal pain, melena, blood in stool, hematemesis, jaundice and rectal bleeding.    Genitourinary: Negative for frequency, dysuria, incontinence,flank pain and hematuria; Musculoskeletal: Negative for back pain and neck pain. Negative for swelling and trauma.;  Skin: . Negative for pruritus, rash, abrasions, bruising and skin lesion.; ulcerations Neuro: Negative for headache, lightheadedness and neck stiffness. Negative for weakness, altered level of consciousness , altered mental status, extremity weakness, burning feet, involuntary movement, seizure and syncope.  Psych: negative for anxiety,  depression, insomnia, tearfulness, panic attacks, hallucinations, paranoia, suicidal or homicidal ideation    Past Medical History  Diagnosis Date  . GERD (gastroesophageal reflux disease)   . COPD (chronic obstructive pulmonary disease)     severe by PFTs August 2010  . 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 Atrial fibrillation     post-op a-fib. 3/11. post Aortobifem , /  Atrial fibrillation from nebulizer treatment  May, 2012, while hospitalized  . Renal artery stenosis      presumably corrected at time of aortobifem surgery March, 2011  . Tobacco user      stop smoking  . Pneumothorax      2011  before aortobifemoral surgery,  resolved  . Peripheral vascular disease, unspecified     bilateral intermittent claudication  . CAD (coronary artery disease), native coronary artery     catheterization 06/2009.Marland KitchenMarland Kitchen70% proximal LAD and 30% proximal RCA. normal LV function, no intervention planned prior to surgery for aorta. LV normal function  by catheter 8/10.   [name] Dyslipidemia   . DJD (degenerative joint disease)   . Ejection fraction     60%, echo, May, 2012, rapid atrial fib at the time  . Cancer 2013    Prostate Radiation  . Myocardial infarction 2011    Past Surgical History  Procedure Laterality Date  . Abdominal aortic aneurysm repair  01/11/2010  . Pr vein bypass graft,aorto-fem-pop    . Inguinal hernia repair      RIGHT    Medications:  HOME MEDS: Prior to Admission medications   Medication Sig Start Date End Date Taking? Authorizing  Provider  albuterol (PROVENTIL HFA;VENTOLIN HFA) 108 (90 BASE) MCG/ACT inhaler Inhale 2 puffs into the lungs every 4 (four) hours as needed for wheezing or shortness of breath. 01/17/15  Yes Kristen N Ward, DO  albuterol (PROVENTIL) (2.5 MG/3ML) 0.083% nebulizer solution Take 2.5 mg by nebulization 3 (three) times daily as needed for wheezing or shortness  of breath.   Yes Historical Provider, MD  apixaban (ELIQUIS) 5 MG TABS tablet Take 1 tablet (5 mg total) by mouth 2 (two) times daily. 01/19/15  Yes [name], MD  gabapentin (NEURONTIN) 300 MG capsule Take 300 mg by mouth 3 (three) times daily. 02/13/14  Yes Historical Provider, MD  HYDROcodone-acetaminophen (NORCO/VICODIN) 5-325 MG per tablet Take 1 tablet by mouth every 6 (six) hours as needed for moderate pain.   Yes Historical Provider, MD  metoprolol (LOPRESSOR) 50 MG tablet Take 50 mg by mouth 2 (two) times daily. 12/27/13  Yes Historical Provider, MD  nitroGLYCERIN (NITROSTAT) 0.4 MG SL tablet Place 1 tablet (0.4 mg total) under the tongue every 5 (five) minutes as needed. 09/20/12  Yes [name], MD  omeprazole (PRILOSEC) 20 MG capsule Take 1 capsule by mouth every morning.  04/21/11  Yes Historical Provider, MD  simvastatin (ZOCOR) 40 MG tablet Take 1 tablet (40 mg total) by mouth every evening. 06/15/14  Yes [name], MD  guaiFENesin (ROBITUSSIN) 100 MG/5ML liquid Take 5-10 mLs (100-200 mg total) by mouth every 4 (four) hours as needed for cough. Patient not taking: Reported on 02/15/2015 01/17/15   [name], DO  levofloxacin (LEVAQUIN) 500 MG tablet Take 1 tablet (500 mg total) by mouth daily. Patient not taking: Reported on 02/15/2015 01/17/15   [name], DO  predniSONE (DELTASONE) 50 MG tablet 1 tablet daily for 4 days, one half tablet daily for 4 days Patient not taking: Reported on 03/13/2015 02/15/15   [name], MD     Allergies:  No Known Allergies  Social History:   reports that he quit smoking about 5 years ago. His smoking use included Cigarettes. He has a 80 pack-year smoking history. He has never used smokeless tobacco. He reports that he does not drink alcohol. His drug history is not on file.  Family History: Family History  Problem Relation Age of Onset  . Cancer Father     prostate     Physical Exam: Filed Vitals:   03/13/15 2150 03/13/15 2152  03/13/15 2205 03/14/15 0026  BP:    95/64  Pulse:    102  Temp:    97.6 F (36.4 C)  TempSrc:    Oral  Resp:    20  SpO2: 99% 99% 98% 99%   Blood pressure 95/64, pulse 102, temperature 97.6 F (36.4 C), temperature source Oral, resp. rate 20, SpO2 99 %.  GEN:  Pleasant  patient lying in the stretcher in no acute distress; cooperative with exam. PSYCH:  alert and oriented x4; does not appear anxious or depressed; affect is appropriate. HEENT: Mucous membranes pink and anicteric; PERRLA; EOM intact; no cervical lymphadenopathy nor thyromegaly or carotid bruit; no JVD; There were no stridor. Neck is very supple. Breasts:: Not examined CHEST WALL: No tenderness CHEST: Normal respiration, tight bilateral wheezing with no rales.   HEART: Regular rate and rhythm.  There are no murmur, rub, or gallops.   BACK: No kyphosis or scoliosis; no CVA tenderness ABDOMEN: soft and non-tender; no masses, no organomegaly, normal abdominal bowel sounds; no pannus; no intertriginous  candida. There is no rebound and no distention. Rectal Exam: Not done EXTREMITIES: No bone or joint deformity; age-appropriate arthropathy of the hands and knees; no edema; no ulcerations.  There is no calf tenderness. Genitalia: not examined PULSES: 2+ and symmetric SKIN: Normal hydration no rash or ulceration CNS: Cranial nerves 2-12 grossly intact no focal lateralizing neurologic deficit.  Speech is fluent; uvula elevated with phonation, facial symmetry and tongue midline. DTR are normal bilaterally, cerebella exam is intact, barbinski is negative and strengths are equaled bilaterally.  No sensory loss.   Labs on Admission:  Basic Metabolic Panel:  Recent Labs Lab 03/13/15 2230  NA 140  K 4.2  CL 106  CO2 29  GLUCOSE 101*  BUN 9  CREATININE 0.75  CALCIUM 8.6*   CBC:  Recent Labs Lab 03/13/15 2230  WBC 11.0*  NEUTROABS 7.1  HGB 13.3  HCT 42.2  MCV 92.5  PLT 240   Cardiac Enzymes:  Recent Labs Lab  03/13/15 2230  TROPONINI <0.03    CBG: No results for input(s): GLUCAP in the last 168 hours.   Radiological Exams on Admission: Dg Chest Portable 1 View  03/13/2015   CLINICAL DATA:  Increasing shortness of breath and wheezing for 2 days, gasping for breath, history prostate cancer, COPD, coronary artery disease post MI, smoking  EXAM: PORTABLE CHEST - 1 VIEW  COMPARISON:  Portable exam 2155 hr compared to 02/15/2015  FINDINGS: Normal heart size, mediastinal contours and pulmonary vascularity.  Emphysematous changes consistent with COPD.  Stable biapical scarring RIGHT greater than LEFT and slight chronic accentuation of basilar interstitial markings.  No acute infiltrate, pleural effusion or pneumothorax.  Bones unremarkable.  IMPRESSION: COPD with biapical scarring.  No acute abnormalities.   Electronically Signed   By: Lavonia Dana M.D.   On: 03/13/2015 22:09   Assessment/Plan Present on Admission:  . COPD exacerbation . Dyslipidemia . GERD (gastroesophageal reflux disease) . PAF (paroxysmal atrial fibrillation) . COPD with acute exacerbation . COPD with exacerbation  PLAN:  Will admit him for COPD exacerbation.  Will continue with IV Solumedrol, nebs, and will continue with IV antibiotics (inpatient tx of COPD exacerbation).  Please be cognizant that he is on chronic steroid when transitioning him to oral steroids.  For his afib, he is rate controlled, and will continue his anticoagulation.  He will be placed on telemetry.  He is stable, full code, and will be admitted to [facility] service.  Dr Natasha Bence, thank you for allowing me to participate in the care of your nice patient.   Other plans as per orders.  Code Status: FULL Haskel Khan, MD. [facility] Pager [phone] 7pm to 7am.  03/14/2015, 12:30 AM

## 2015-03-14 NOTE — Care Management Note (Signed)
Case Management Note  Patient Details  Name: Kenneth Merritt MRN: [id number] Date of Birth: September 23, 1951  Subjective/Objective:                  Pt admitted from home with COPD exacerbation. Pt lives with his wife and will return home at discharge. Pt is independent with ADL's. Pt has home O2 (unable to recall name of agency) and neb machine for home use. Pt only uses home O2 prn but has portables and concentrator at home.  Action/Plan: Will continue to follow for discharge planning needs.  Expected Discharge Date:  03/17/15               Expected Discharge Plan:  Home/Self Care  In-House Referral:  NA  Discharge planning Services  CM Consult  Post Acute Care Choice:    Choice offered to:     DME Arranged:    DME Agency:     HH Arranged:    HH Agency:     Status of Service:  In process, will continue to follow  Medicare Important Message Given:    Date Medicare IM Given:    Medicare IM give by:    Date Additional Medicare IM Given:    Additional Medicare Important Message give by:     If discussed at [place] of Stay Meetings, dates discussed:    Additional Comments:  Joylene Draft, RN 03/14/2015, 11:24 AM

## 2015-03-15 MED ORDER — ALBUTEROL SULFATE (2.5 MG/3ML) 0.083% IN NEBU: 2.5000 mg | INHALATION_SOLUTION | RESPIRATORY_TRACT | Status: DC | PRN

## 2015-03-15 MED ORDER — LEVOFLOXACIN 750 MG PO TABS
750.0000 mg | ORAL_TABLET | Freq: Every day | ORAL | Status: DC
Start: 2015-03-15 — End: 2015-03-15

## 2015-03-15 MED ORDER — CETYLPYRIDINIUM CHLORIDE 0.05 % MT LIQD
7.0000 mL | Freq: Two times a day (BID) | OROMUCOSAL | Status: DC
  Administered 2015-03-15: 7 mL via OROMUCOSAL

## 2015-03-15 MED ORDER — AZITHROMYCIN 250 MG PO TABS: ORAL_TABLET | ORAL | Status: DC

## 2015-03-15 MED ORDER — PREDNISONE 10 MG PO TABS: ORAL_TABLET | ORAL | Status: DC

## 2015-03-15 NOTE — Progress Notes (Signed)
PHARMACIST - PHYSICIAN COMMUNICATION  DR:   Roderic Palau CONCERNING: IV to Oral Route Change Policy  RECOMMENDATION: This patient is receiving Levaquin by the intravenous route.  Based on criteria approved by the Pharmacy and Therapeutics Committee, the intravenous medication(s) is/are being converted to the equivalent oral dose form(s).   DESCRIPTION: These criteria include:  The patient is eating (either orally or via tube) and/or has been taking other orally administered medications for a least 24 hours  The patient has no evidence of active gastrointestinal bleeding or impaired GI absorption (gastrectomy, short bowel, patient on TNA or NPO).  If you have questions about this conversion, please contact the Pharmacy Department  [x]   [phone] )  [facility] []   [phone] )  [facility] []   [phone] )  [facility] []   [phone] )  [facility] []   [phone] )  [facility], [place], [facility] 03/15/2015 1:01 PM

## 2015-03-15 NOTE — Discharge Summary (Signed)
Physician Discharge Summary  LADD CEN KAJ:[id number] DOB: 1951/09/04 DOA: 03/13/2015  PCP: Neale Burly, MD  Admit date: 03/13/2015 Discharge date: 03/15/2015  Time spent: 40 minutes  Recommendations for Outpatient Follow-up:  1. Patient to follow up with his primary care physician in 1-2 weeks  Discharge Diagnoses:  Active Problems:   GERD (gastroesophageal reflux disease)   Dyslipidemia   COPD exacerbation   PAF (paroxysmal atrial fibrillation)   HX: anticoagulation   On prednisone therapy   COPD with acute exacerbation   COPD with exacerbation Chronic respiratory failure with hypoxia  Discharge Condition: improving  Diet recommendation: low salt  Filed Weights   03/14/15 0225  Weight: 64.184 kg (141 lb 8 oz)    History of present illness:  This patient presents to the hospital with complaints of progressive shortness of breath for 2 days prior to admission. He has steroid-dependent/oxygen dependent COPD, although he only uses his oxygen when needed at home. He was treated with steroids and bronchodilators in the emergency room and admitted for further treatments.  Hospital Course:  Patient was placed on intravenous steroids, bronchodilators and antibiotics. He quickly improved following admission. He is now on room air and reports that his breathing has improved. He still has mildly increased work of breathing but reports that this is his baseline. He is very adamant about being discharged home today. He reports that his son is having surgery today and he cannot stay in the hospital. He was advised to stay in the hospital until he has further improved, closer to his baseline. The patient reports that he is currently very close to his baseline and feels comfortable discharge home. He has bronchodilator/nebulizers at home as well as supplemental oxygen. He'll be placed on a prednisone taper. He was told to return to the emergency room if his respiratory status  worsens.  Procedures:    Consultations:    Discharge Exam: Filed Vitals:   03/15/15 0446  BP: 103/68  Pulse: 93  Temp: 98.2 F (36.8 C)  Resp: 20    General: NAD Cardiovascular: S1, S2 RRR Respiratory: diminished breath sounds bilaterally, no wheezing  Discharge Instructions   Discharge Instructions    Diet - low sodium heart healthy    Complete by:  As directed      Increase activity slowly    Complete by:  As directed           Discharge Medication List as of 03/15/2015 12:33 PM    START taking these medications   Details  azithromycin (ZITHROMAX Z-PAK) 250 MG tablet Take 500mg  po daily on day 1, then 250mg  po daily until complete, Print      CONTINUE these medications which have CHANGED   Details  albuterol (PROVENTIL) (2.5 MG/3ML) 0.083% nebulizer solution Take 3 mLs (2.5 mg total) by nebulization every 4 (four) hours as needed for wheezing or shortness of breath., Starting 03/15/2015, Until Discontinued, No Print    predniSONE (DELTASONE) 10 MG tablet Take 60mg  po daily for 2 days then 40mg  po daily for 2 days then 30mg  po daily for 2 days then 20mg  po daily for 2 days then 10mg  po daily, Print      CONTINUE these medications which have NOT CHANGED   Details  albuterol (PROVENTIL HFA;VENTOLIN HFA) 108 (90 BASE) MCG/ACT inhaler Inhale 2 puffs into the lungs every 4 (four) hours as needed for wheezing or shortness of breath., Starting 01/17/2015, Until Discontinued, Print    apixaban (ELIQUIS) 5 MG TABS tablet  Take 1 tablet (5 mg total) by mouth 2 (two) times daily., Starting 01/19/2015, Until Discontinued, Normal    gabapentin (NEURONTIN) 300 MG capsule Take 300 mg by mouth 3 (three) times daily., Starting 02/13/2014, Until Discontinued, Historical Med    HYDROcodone-acetaminophen (NORCO/VICODIN) 5-325 MG per tablet Take 1 tablet by mouth every 6 (six) hours as needed for moderate pain., Until Discontinued, Historical Med    metoprolol (LOPRESSOR) 50 MG tablet  Take 50 mg by mouth 2 (two) times daily., Starting 12/27/2013, Until Discontinued, Historical Med    nitroGLYCERIN (NITROSTAT) 0.4 MG SL tablet Place 1 tablet (0.4 mg total) under the tongue every 5 (five) minutes as needed., Starting 09/20/2012, Until Discontinued, Normal    omeprazole (PRILOSEC) 20 MG capsule Take 1 capsule by mouth every morning. , Starting 04/21/2011, Until Discontinued, Historical Med    simvastatin (ZOCOR) 40 MG tablet Take 1 tablet (40 mg total) by mouth every evening., Starting 06/15/2014, Until Discontinued, Normal    guaiFENesin (ROBITUSSIN) 100 MG/5ML liquid Take 5-10 mLs (100-200 mg total) by mouth every 4 (four) hours as needed for cough., Starting 01/17/2015, Until Discontinued, Print      STOP taking these medications     levofloxacin (LEVAQUIN) 500 MG tablet        No Known Allergies    The results of significant diagnostics from this hospitalization (including imaging, microbiology, ancillary and laboratory) are listed below for reference.    Significant Diagnostic Studies: Dg Chest Portable 1 View  03/13/2015   CLINICAL DATA:  Increasing shortness of breath and wheezing for 2 days, gasping for breath, history prostate cancer, COPD, coronary artery disease post MI, smoking  EXAM: PORTABLE CHEST - 1 VIEW  COMPARISON:  Portable exam 2155 hr compared to 02/15/2015  FINDINGS: Normal heart size, mediastinal contours and pulmonary vascularity.  Emphysematous changes consistent with COPD.  Stable biapical scarring RIGHT greater than LEFT and slight chronic accentuation of basilar interstitial markings.  No acute infiltrate, pleural effusion or pneumothorax.  Bones unremarkable.  IMPRESSION: COPD with biapical scarring.  No acute abnormalities.   Electronically Signed   By: Lavonia Dana M.D.   On: 03/13/2015 22:09   Dg Chest Port 1 View  02/15/2015   CLINICAL DATA:  Left-sided chest pain. Sharp in have the chest pain onset of symptoms at 8 a.m. Short of breath.  EXAM:  PORTABLE CHEST - 1 VIEW  COMPARISON:  None.  FINDINGS: Emphysema. Cardiopericardial silhouette within normal limits. Mediastinal contours normal. Trachea midline. No airspace disease or effusion. Bilateral pleural apical scarring. Monitoring leads project over the chest.  IMPRESSION: Emphysema without acute cardiopulmonary disease.   Electronically Signed   By: Dereck Ligas M.D.   On: 02/15/2015 13:00    Microbiology: No results found for this or any previous visit (from the past 240 hour(s)).   Labs: Basic Metabolic Panel:  Recent Labs Lab 03/13/15 2230  NA 140  K 4.2  CL 106  CO2 29  GLUCOSE 101*  BUN 9  CREATININE 0.75  CALCIUM 8.6*   Liver Function Tests: No results for input(s): AST, ALT, ALKPHOS, BILITOT, PROT, ALBUMIN in the last 168 hours. No results for input(s): LIPASE, AMYLASE in the last 168 hours. No results for input(s): AMMONIA in the last 168 hours. CBC:  Recent Labs Lab 03/13/15 2230  WBC 11.0*  NEUTROABS 7.1  HGB 13.3  HCT 42.2  MCV 92.5  PLT 240   Cardiac Enzymes:  Recent Labs Lab 03/13/15 2230  TROPONINI <0.03  BNP: BNP (last 3 results)  Recent Labs  11/26/14 2101 01/17/15 0029  BNP 33.6 42.0    ProBNP (last 3 results) No results for input(s): PROBNP in the last 8760 hours.  CBG: No results for input(s): GLUCAP in the last 168 hours.     Signed:  Ruthmary Occhipinti  [facility] 03/15/2015, 4:35 PM

## 2015-03-15 NOTE — Care Management Note (Signed)
Case Management Note  Patient Details  Name: LAQUAN BEIER MRN: [id number] Date of Birth: December 01, 1950  Subjective/Objective:                    Action/Plan:   Expected Discharge Date:  03/17/15               Expected Discharge Plan:  Home/Self Care  In-House Referral:  NA  Discharge planning Services  CM Consult  Post Acute Care Choice:  NA Choice offered to:  NA  DME Arranged:    DME Agency:     HH Arranged:    [place] Agency:     Status of Service:  Completed, signed off  Medicare Important Message Given:  N/A - LOS <3 / Initial given by admissions Date Medicare IM Given:    Medicare IM give by:    Date Additional Medicare IM Given:    Additional Medicare Important Message give by:     If discussed at [place] of Stay Meetings, dates discussed:    Additional Comments: Pt discharged home today. No CM needs noted. Pt has home O2 and neb machine in place. Christinia Gully [place], RN 03/15/2015, 12:12 PM

## 2015-03-15 NOTE — Progress Notes (Signed)
UR chart review completed.  

## 2015-03-15 NOTE — Progress Notes (Signed)
Removed pt IV and telemetry, tolerated well.  Reviewed discharge instructions and prescriptions, answered pt questions.  Will continue to monitor until pt leaves floor.

## 2015-03-15 NOTE — Progress Notes (Signed)
Pt refused rest of levaquin. He said that he did not like how it made him feel.

## 2015-03-26 ENCOUNTER — Other Ambulatory Visit: Payer: Self-pay | Admitting: *Deleted

## 2015-03-26 MED ORDER — APIXABAN 5 MG PO TABS: 5.0000 mg | ORAL_TABLET | Freq: Two times a day (BID) | ORAL | Status: DC

## 2015-04-26 ENCOUNTER — Other Ambulatory Visit: Payer: Self-pay | Admitting: Cardiology

## 2015-04-27 ENCOUNTER — Encounter: Payer: Self-pay | Admitting: Family

## 2015-04-30 ENCOUNTER — Ambulatory Visit: Payer: Medicare Other | Admitting: Family

## 2015-04-30 ENCOUNTER — Encounter (HOSPITAL_COMMUNITY): Payer: Medicare Other

## 2015-05-02 ENCOUNTER — Ambulatory Visit: Payer: Medicare Other | Admitting: Family

## 2015-05-02 ENCOUNTER — Encounter (HOSPITAL_COMMUNITY): Payer: Medicare Other

## 2015-05-13 ENCOUNTER — Other Ambulatory Visit: Payer: Self-pay | Admitting: Cardiology

## 2015-05-18 ENCOUNTER — Encounter: Payer: Self-pay | Admitting: Family

## 2015-05-22 ENCOUNTER — Other Ambulatory Visit: Payer: Self-pay | Admitting: Family

## 2015-05-22 ENCOUNTER — Ambulatory Visit (HOSPITAL_COMMUNITY)
Admission: RE | Admit: 2015-05-22 | Discharge: 2015-05-22 | Disposition: A | Discharge status: Home / Self Care | Payer: Medicare Other | Source: Ambulatory Visit | Attending: Family | Admitting: Family

## 2015-05-22 ENCOUNTER — Ambulatory Visit (INDEPENDENT_AMBULATORY_CARE_PROVIDER_SITE_OTHER): Payer: Medicare Other | Admitting: Family

## 2015-05-22 ENCOUNTER — Encounter: Payer: Self-pay | Admitting: Family

## 2015-05-22 VITALS — BP 113/76 | HR 84 | Resp 16 | Ht 64.0 in | Wt 147.0 lb

## 2015-05-22 DIAGNOSIS — E785 Hyperlipidemia, unspecified: Secondary | ICD-10-CM | POA: Insufficient documentation

## 2015-05-22 DIAGNOSIS — I739 Peripheral vascular disease, unspecified: Secondary | ICD-10-CM

## 2015-05-22 DIAGNOSIS — Z95828 Presence of other vascular implants and grafts: Secondary | ICD-10-CM | POA: Diagnosis not present

## 2015-05-22 DIAGNOSIS — Z9889 Other specified postprocedural states: Secondary | ICD-10-CM

## 2015-05-22 DIAGNOSIS — Z87891 Personal history of nicotine dependence: Secondary | ICD-10-CM

## 2015-05-22 DIAGNOSIS — I779 Disorder of arteries and arterioles, unspecified: Secondary | ICD-10-CM

## 2015-05-22 DIAGNOSIS — Z48812 Encounter for surgical aftercare following surgery on the circulatory system: Secondary | ICD-10-CM

## 2015-05-22 NOTE — Progress Notes (Signed)
[facility] [place] HISTORY AND PHYSICAL -PAD  History of Present Illness Kenneth Merritt is a 64 y.o. male  patient of Dr. Trula Slade status post aortobifem bypass graft in March 2011. He returns today for follow up.  Pt. denies claudication in legs with walking, denies non healing wounds.  Pt denies any history of stroke or TIA symptoms.  The patient denies New Medical or Surgical History. Pt states that he walks a great deal, limited by dyspnea, has emphysema which he states is not doing well. He has itching and burning around his upper right rib cage from shingles, states this started 2 days after receiving the Herpes Zoster vaccine, per pt. His appetite is good.  Pt Diabetic: No Pt smoker: former smoker, quit in 2011  Pt meds include: Statin :Yes ASA: No Other anticoagulants/antiplatelets: takes Eliquis for atrial fib.     Past Medical History  Diagnosis Date  . GERD (gastroesophageal reflux disease)   . COPD (chronic obstructive pulmonary disease)     severe by PFTs August 2010  . 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 Atrial fibrillation     post-op a-fib. 3/11. post Aortobifem , /  Atrial fibrillation from nebulizer treatment  May, 2012, while hospitalized  . Renal artery stenosis      presumably corrected at time of aortobifem surgery March, 2011  . Tobacco user      stop smoking  . Pneumothorax      2011  before aortobifemoral surgery,  resolved  . Peripheral vascular disease, unspecified     bilateral intermittent claudication  . CAD (coronary artery disease), native coronary artery     catheterization 06/2009.Marland KitchenMarland Kitchen70% proximal LAD and 30% proximal RCA. normal LV function, no intervention planned prior to surgery for aorta. LV normal function  by catheter 8/10.   [name] Dyslipidemia   . DJD (degenerative joint disease)   . Ejection fraction     60%, echo, May,  2012, rapid atrial fib at the time  . Cancer 2013    Prostate Radiation  . Myocardial infarction 2011    Social History History  Substance Use Topics  . Smoking status: Former Smoker -- 2.00 packs/day for 40 years    Types: Cigarettes    Quit date: 01/11/2010  . Smokeless tobacco: Never Used  . Alcohol Use: No    Family History Family History  Problem Relation Age of Onset  . Cancer Father     prostate  . Heart attack Father     Past Surgical History  Procedure Laterality Date  . Abdominal aortic aneurysm repair  01/11/2010  . Pr vein bypass graft,aorto-fem-pop    . Inguinal hernia repair      RIGHT    No Known Allergies  Current Outpatient Prescriptions  Medication Sig Dispense Refill  . albuterol (PROVENTIL HFA;VENTOLIN HFA) 108 (90 BASE) MCG/ACT inhaler Inhale 2 puffs into the lungs every 4 (four) hours as needed for wheezing or shortness of breath. 1 Inhaler 0  . albuterol (PROVENTIL) (2.5 MG/3ML) 0.083% nebulizer solution Take 3 mLs (2.5 mg total) by nebulization every 4 (four) hours as needed for wheezing or shortness of breath. 75 mL 12  . ELIQUIS 5 MG TABS tablet TAKE 1 TABLET (5 MG TOTAL) BY MOUTH 2 (TWO) TIMES DAILY. 30 tablet 0  . ELIQUIS 5 MG TABS tablet TAKE 1 TABLET (5  MG TOTAL) BY MOUTH 2 (TWO) TIMES DAILY. 60 tablet 0  . gabapentin (NEURONTIN) 300 MG capsule Take 300 mg by mouth 3 (three) times daily.    [name] guaiFENesin (ROBITUSSIN) 100 MG/5ML liquid Take 5-10 mLs (100-200 mg total) by mouth every 4 (four) hours as needed for cough. 60 mL 0  . HYDROcodone-acetaminophen (NORCO/VICODIN) 5-325 MG per tablet Take 1 tablet by mouth every 6 (six) hours as needed for moderate pain.    . metoprolol (LOPRESSOR) 50 MG tablet Take 50 mg by mouth 2 (two) times daily.    . nitroGLYCERIN (NITROSTAT) 0.4 MG SL tablet Place 1 tablet (0.4 mg total) under the tongue every 5 (five) minutes as needed. 25 tablet 3  . nystatin (MYCOSTATIN) 100000 UNIT/ML suspension     .  omeprazole (PRILOSEC) 20 MG capsule Take 1 capsule by mouth every morning.     . predniSONE (DELTASONE) 10 MG tablet Take 60mg  po daily for 2 days then 40mg  po daily for 2 days then 30mg  po daily for 2 days then 20mg  po daily for 2 days then 10mg  po daily 32 tablet 0  . simvastatin (ZOCOR) 40 MG tablet Take 1 tablet (40 mg total) by mouth every evening. 30 tablet 3  . azithromycin (ZITHROMAX Z-PAK) 250 MG tablet Take 500mg  po daily on day 1, then 250mg  po daily until complete (Patient not taking: Reported on 05/22/2015) 6 each 0   No current facility-administered medications for this visit.    ROS: See HPI for pertinent positives and negatives.   Physical Examination  Filed Vitals:   05/22/15 1136  BP: 113/76  Pulse: 84  Resp: 16  Height: 5\' 4"  (1.626 m)  Weight: 147 lb (66.679 kg)  SpO2: 96%   Body mass index is 25.22 kg/(m^2).  General: A&O x 3, WDWN. Gait: normal Eyes: PERRLA. Pulmonary: Pursed lips respirations, diminished breath sounds in all fields, expiratory wheezes, rhonchi in anterior fields. Frequent moist cough. Cardiac: regular Rythm , without detected murmur.     Carotid Bruits Left Right   Negative Negative  Aorta is not palpable. Radial pulses: are 2+ palpable and =   VASCULAR EXAM: Extremities without ischemic changes  without Gangrene; without open wounds.     LE Pulses LEFT RIGHT   FEMORAL not palpable not palpable    POPLITEAL not palpable  not palpable   POSTERIOR TIBIAL not palpable  not palpable    DORSALIS PEDIS  ANTERIOR TIBIAL 1+ palpable  1+ palpable    Abdomen: soft, NT, no palpable masses. Skin: no rashes, no ulcers. Musculoskeletal: no muscle wasting or atrophy. Neurologic: A&O X 3; Appropriate Affect,; MOTOR  FUNCTION: moving all extremities equally, motor strength 5/5 throughout. Speech is fluent/normal. CN 2-12 intact.           Non-Invasive Vascular Imaging: DATE: 05/22/2015 ABI: RIGHT: 1.0 (1.0, 04/24/14), Waveforms: biphasic, TBI: 0.78;  LEFT: 1.0 (0.91), Waveforms: biphasic, TBI: 1.01   ASSESSMENT: Kenneth Merritt is a 64 y.o. male who is status post aortobifem bypass graft in March 2011.  His walking is limited by dyspnea from severe emphysema. He has no claudication symptoms with walking, no non healing wounds in his LE's. ABI's and TBI's today remain in the normal range with all biphasic waveforms.    PLAN:  Based on the patient's vascular studies and examination, pt will return to clinic in 1 year with ABI's.   I discussed in depth with the patient the nature of atherosclerosis, and emphasized the importance of  maximal medical management including strict control of blood pressure, blood glucose, and lipid levels, obtaining regular exercise, and continued cessation of smoking.  The patient is aware that without maximal medical management the underlying atherosclerotic disease process will progress, limiting the benefit of any interventions.  The patient was given information about PAD including signs, symptoms, treatment, what symptoms should prompt the patient to seek immediate medical care, and risk reduction measures to take.  Clemon Chambers, RN, MSN, FNP-C [facility] Phone: [phone]  Clinic MD: Early  05/22/2015 11:47 AM

## 2015-05-29 ENCOUNTER — Ambulatory Visit (INDEPENDENT_AMBULATORY_CARE_PROVIDER_SITE_OTHER): Payer: Medicare Other | Admitting: Urology

## 2015-05-29 DIAGNOSIS — C61 Malignant neoplasm of prostate: Secondary | ICD-10-CM | POA: Diagnosis not present

## 2015-05-31 ENCOUNTER — Encounter: Payer: Self-pay | Admitting: Cardiology

## 2015-05-31 ENCOUNTER — Encounter: Payer: Self-pay | Admitting: *Deleted

## 2015-05-31 ENCOUNTER — Ambulatory Visit (INDEPENDENT_AMBULATORY_CARE_PROVIDER_SITE_OTHER): Payer: Medicare Other | Admitting: Cardiology

## 2015-05-31 VITALS — BP 138/92 | HR 75 | Ht 64.0 in | Wt 145.0 lb

## 2015-05-31 DIAGNOSIS — I480 Paroxysmal atrial fibrillation: Secondary | ICD-10-CM | POA: Diagnosis not present

## 2015-05-31 DIAGNOSIS — I25110 Atherosclerotic heart disease of native coronary artery with unstable angina pectoris: Secondary | ICD-10-CM

## 2015-05-31 DIAGNOSIS — R0989 Other specified symptoms and signs involving the circulatory and respiratory systems: Secondary | ICD-10-CM | POA: Diagnosis not present

## 2015-05-31 DIAGNOSIS — IMO0002 Reserved for concepts with insufficient information to code with codable children: Secondary | ICD-10-CM

## 2015-05-31 DIAGNOSIS — I779 Disorder of arteries and arterioles, unspecified: Secondary | ICD-10-CM | POA: Diagnosis not present

## 2015-05-31 DIAGNOSIS — R9430 Abnormal result of cardiovascular function study, unspecified: Secondary | ICD-10-CM

## 2015-05-31 NOTE — Patient Instructions (Signed)
Continue all current medications. Your physician wants you to follow up in:  1 year.  You will receive a reminder letter in the mail one-two months in advance.  If you don't receive a letter, please call our office to schedule the follow up appointment   

## 2015-05-31 NOTE — Assessment & Plan Note (Signed)
The patient is anticoagulated. No further workup.

## 2015-05-31 NOTE — Assessment & Plan Note (Signed)
Left ventricular function has been normal by history. His ejection fraction was 55-60% by echo in April, 2013. Further assessment is not needed at this time.

## 2015-05-31 NOTE — Assessment & Plan Note (Signed)
Coronary disease is stable. He is not having any significant symptoms. No further workup.

## 2015-05-31 NOTE — Progress Notes (Signed)
Cardiology Office Note   Date:  05/31/2015   ID:  Kenneth Merritt, DOB 26-Jul-1951, MRN [id number]  PCP:  Neale Burly, MD  Cardiologist:  Dola Argyle, MD   Chief Complaint  Patient presents with  . Appointment    Follow-up coronary disease      History of Present Illness: Kenneth Merritt is a 64 y.o. male who presents to follow-up coronary disease. I saw him last June, 2015. His cardiac status has been stable. He has severe steroid-dependent COPD. He has recurrent visits to [facility] and [facility]. He has not had any chest pain.    Past Medical History  Diagnosis Date  . GERD (gastroesophageal reflux disease)   . COPD (chronic obstructive pulmonary disease)     severe by PFTs August 2010  . 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 Atrial fibrillation     post-op a-fib. 3/11. post Aortobifem , /  Atrial fibrillation from nebulizer treatment  May, 2012, while hospitalized  . Renal artery stenosis      presumably corrected at time of aortobifem surgery March, 2011  . Tobacco user      stop smoking  . Pneumothorax      2011  before aortobifemoral surgery,  resolved  . Peripheral vascular disease, unspecified     bilateral intermittent claudication  . CAD (coronary artery disease), native coronary artery     catheterization 06/2009.Marland KitchenMarland Kitchen70% proximal LAD and 30% proximal RCA. normal LV function, no intervention planned prior to surgery for aorta. LV normal function  by catheter 8/10.   [name] Dyslipidemia   . DJD (degenerative joint disease)   . Ejection fraction     60%, echo, May, 2012, rapid atrial fib at the time  . Cancer 2013    Prostate Radiation  . Myocardial infarction 2011    Past Surgical History  Procedure Laterality Date  . Abdominal aortic aneurysm repair  01/11/2010  . Pr vein bypass graft,aorto-fem-pop    . Inguinal hernia repair      RIGHT    Patient  Active Problem List   Diagnosis Date Noted  . On prednisone therapy 04/14/2014  . HX: anticoagulation 04/13/2014  . Acute respiratory failure 11/29/2013  . Leukocytosis, unspecified 11/24/2013  . Sinus tachycardia 11/22/2013  . BRBPR (bright red blood per rectum) 11/22/2013  . PAF (paroxysmal atrial fibrillation) 11/22/2013  . GERD (gastroesophageal reflux disease)   . COPD (chronic obstructive pulmonary disease)   . Pneumothorax   . Peripheral vascular disease, unspecified   . Dyslipidemia   . DJD (degenerative joint disease)   . S/P aortobifemoral bypass surgery   . Renal artery stenosis   . Tobacco user   . CAD (coronary artery disease), native coronary artery   . Ejection fraction       Current Outpatient Prescriptions  Medication Sig Dispense Refill  . albuterol (PROVENTIL HFA;VENTOLIN HFA) 108 (90 BASE) MCG/ACT inhaler Inhale 2 puffs into the lungs every 4 (four) hours as needed for wheezing or shortness of breath. 1 Inhaler 0  . albuterol (PROVENTIL) (2.5 MG/3ML) 0.083% nebulizer solution Take 3 mLs (2.5 mg total) by nebulization every 4 (four) hours as needed for wheezing or shortness of breath. 75 mL 12  . ELIQUIS 5 MG TABS tablet TAKE 1 TABLET (5 MG TOTAL) BY MOUTH 2 (TWO) TIMES DAILY. 30 tablet 0  .  gabapentin (NEURONTIN) 300 MG capsule Take by mouth 3 (three) times daily. 900pm AM 600mg  pm 900mg  pm    . guaiFENesin (ROBITUSSIN) 100 MG/5ML liquid Take 5-10 mLs (100-200 mg total) by mouth every 4 (four) hours as needed for cough. 60 mL 0  . HYDROcodone-acetaminophen (NORCO/VICODIN) 5-325 MG per tablet Take 1 tablet by mouth every 6 (six) hours as needed for moderate pain.    . metoprolol (LOPRESSOR) 50 MG tablet Take 50 mg by mouth 2 (two) times daily.    . nitroGLYCERIN (NITROSTAT) 0.4 MG SL tablet Place 1 tablet (0.4 mg total) under the tongue every 5 (five) minutes as needed. 25 tablet 3  . nystatin (MYCOSTATIN) 100000 UNIT/ML suspension     . omeprazole (PRILOSEC) 20  MG capsule Take 1 capsule by mouth every morning.     . predniSONE (DELTASONE) 10 MG tablet Take 60mg  po daily for 2 days then 40mg  po daily for 2 days then 30mg  po daily for 2 days then 20mg  po daily for 2 days then 10mg  po daily (Patient taking differently: Take 10 mg by mouth daily with breakfast. 10mg  po daily) 32 tablet 0  . simvastatin (ZOCOR) 40 MG tablet Take 1 tablet (40 mg total) by mouth every evening. 30 tablet 3   No current facility-administered medications for this visit.    Allergies:   Review of patient's allergies indicates no known allergies.    Social History:  The patient  reports that he quit smoking about 5 years ago. His smoking use included Cigarettes. He has a 80 pack-year smoking history. He has never used smokeless tobacco. He reports that he does not drink alcohol or use illicit drugs.   Family History:  The patient's family history includes Cancer in his father; Heart attack in his father.    ROS:  Please see the history of present illness.     Patient denies fever, chills, headache, sweats, rash, change in vision, change in hearing, chest pain, nausea vomiting, urinary symptoms. All other systems are reviewed and are negative.   PHYSICAL EXAM: VS:  BP 138/92 mmHg  Pulse 75  Ht 5\' 4"  (1.626 m)  Wt 145 lb (65.772 kg)  BMI 24.88 kg/m2  SpO2 98% , Patient is oriented to person time and place. Affect is normal. Head is atraumatic. Sclera and conjunctiva were normal. There is no jugulovenous distention. Lungs reveal scattered rhonchi. He has a persistent cough. Cardiac exam reveals S1 and S2. The abdomen is soft. There is no peripheral edema. There are no musculoskeletal deformities. Patient has ecchymoses. Neurologic is grossly intact.  EKG:   EKG is not done today. He has had tracings done [facility]   Recent Labs: 01/17/2015: B Natriuretic Peptide 42.0 03/13/2015: BUN 9; Creatinine, Ser 0.75; Hemoglobin 13.3; Platelets 240;  Potassium 4.2; Sodium 140    Lipid Panel    Component Value Date/Time   CHOL 157 11/22/2013 0920   TRIG 110 11/22/2013 0920   HDL 54 11/22/2013 0920   CHOLHDL 2.9 11/22/2013 0920   VLDL 22 11/22/2013 0920   LDLCALC 81 11/22/2013 0920      Wt Readings from Last 3 Encounters:  05/31/15 145 lb (65.772 kg)  05/22/15 147 lb (66.679 kg)  03/14/15 141 lb 8 oz (64.184 kg)      Current medicines are reviewed  The patient understands his medications.     ASSESSMENT AND PLAN:

## 2015-06-09 ENCOUNTER — Other Ambulatory Visit: Payer: Self-pay | Admitting: Cardiology

## 2015-06-20 ENCOUNTER — Other Ambulatory Visit: Payer: Self-pay | Admitting: *Deleted

## 2015-06-20 MED ORDER — APIXABAN 5 MG PO TABS: 5.0000 mg | ORAL_TABLET | Freq: Two times a day (BID) | ORAL | Status: DC

## 2015-06-25 ENCOUNTER — Telehealth: Payer: Self-pay | Admitting: *Deleted

## 2015-06-25 MED ORDER — SIMVASTATIN 40 MG PO TABS: 40.0000 mg | ORAL_TABLET | Freq: Every evening | ORAL | Status: DC

## 2015-06-25 NOTE — Telephone Encounter (Signed)
Simvastatin refilled today.

## 2015-07-03 ENCOUNTER — Emergency Department (HOSPITAL_COMMUNITY): Payer: Medicare Other

## 2015-07-03 ENCOUNTER — Inpatient Hospital Stay (HOSPITAL_COMMUNITY)
Admission: EM | Admit: 2015-07-03 | Discharge: 2015-07-05 | DRG: 191 | Disposition: A | Discharge status: Home Health | Payer: Medicare Other | Attending: Internal Medicine | Admitting: Internal Medicine

## 2015-07-03 ENCOUNTER — Encounter (HOSPITAL_COMMUNITY): Payer: Self-pay | Admitting: Emergency Medicine

## 2015-07-03 DIAGNOSIS — I480 Paroxysmal atrial fibrillation: Secondary | ICD-10-CM | POA: Diagnosis present

## 2015-07-03 DIAGNOSIS — I2510 Atherosclerotic heart disease of native coronary artery without angina pectoris: Secondary | ICD-10-CM | POA: Diagnosis present

## 2015-07-03 DIAGNOSIS — Z87891 Personal history of nicotine dependence: Secondary | ICD-10-CM

## 2015-07-03 DIAGNOSIS — M1990 Unspecified osteoarthritis, unspecified site: Secondary | ICD-10-CM | POA: Diagnosis present

## 2015-07-03 DIAGNOSIS — T380X5A Adverse effect of glucocorticoids and synthetic analogues, initial encounter: Secondary | ICD-10-CM | POA: Diagnosis present

## 2015-07-03 DIAGNOSIS — E785 Hyperlipidemia, unspecified: Secondary | ICD-10-CM | POA: Diagnosis present

## 2015-07-03 DIAGNOSIS — Z7901 Long term (current) use of anticoagulants: Secondary | ICD-10-CM

## 2015-07-03 DIAGNOSIS — I10 Essential (primary) hypertension: Secondary | ICD-10-CM | POA: Diagnosis present

## 2015-07-03 DIAGNOSIS — R0602 Shortness of breath: Secondary | ICD-10-CM | POA: Diagnosis present

## 2015-07-03 DIAGNOSIS — I252 Old myocardial infarction: Secondary | ICD-10-CM

## 2015-07-03 DIAGNOSIS — I701 Atherosclerosis of renal artery: Secondary | ICD-10-CM | POA: Diagnosis not present

## 2015-07-03 DIAGNOSIS — Z9229 Personal history of other drug therapy: Secondary | ICD-10-CM

## 2015-07-03 DIAGNOSIS — Z9981 Dependence on supplemental oxygen: Secondary | ICD-10-CM | POA: Diagnosis not present

## 2015-07-03 DIAGNOSIS — Z8042 Family history of malignant neoplasm of prostate: Secondary | ICD-10-CM | POA: Diagnosis not present

## 2015-07-03 DIAGNOSIS — E872 Acidosis: Secondary | ICD-10-CM | POA: Diagnosis present

## 2015-07-03 DIAGNOSIS — J449 Chronic obstructive pulmonary disease, unspecified: Secondary | ICD-10-CM | POA: Diagnosis present

## 2015-07-03 DIAGNOSIS — J441 Chronic obstructive pulmonary disease with (acute) exacerbation: Principal | ICD-10-CM | POA: Diagnosis present

## 2015-07-03 DIAGNOSIS — K219 Gastro-esophageal reflux disease without esophagitis: Secondary | ICD-10-CM | POA: Diagnosis present

## 2015-07-03 DIAGNOSIS — Z7952 Long term (current) use of systemic steroids: Secondary | ICD-10-CM | POA: Diagnosis not present

## 2015-07-03 DIAGNOSIS — D72829 Elevated white blood cell count, unspecified: Secondary | ICD-10-CM | POA: Diagnosis not present

## 2015-07-03 DIAGNOSIS — Z8546 Personal history of malignant neoplasm of prostate: Secondary | ICD-10-CM

## 2015-07-03 DIAGNOSIS — Z8249 Family history of ischemic heart disease and other diseases of the circulatory system: Secondary | ICD-10-CM | POA: Diagnosis not present

## 2015-07-03 DIAGNOSIS — J9600 Acute respiratory failure, unspecified whether with hypoxia or hypercapnia: Secondary | ICD-10-CM | POA: Diagnosis present

## 2015-07-03 DIAGNOSIS — Z95828 Presence of other vascular implants and grafts: Secondary | ICD-10-CM

## 2015-07-03 DIAGNOSIS — J9621 Acute and chronic respiratory failure with hypoxia: Secondary | ICD-10-CM | POA: Diagnosis not present

## 2015-07-03 LAB — CBC WITH DIFFERENTIAL/PLATELET
Basophils Absolute: 0 10*3/uL (ref 0.0–0.1)
Basophils Relative: 0 % (ref 0–1)
Eosinophils Absolute: 0.1 10*3/uL (ref 0.0–0.7)
Eosinophils Relative: 1 % (ref 0–5)
HCT: 45.5 % (ref 39.0–52.0)
Hemoglobin: 14.4 g/dL (ref 13.0–17.0)
LYMPHS ABS: 2.2 10*3/uL (ref 0.7–4.0)
LYMPHS PCT: 24 % (ref 12–46)
MCH: 28.9 pg (ref 26.0–34.0)
MCHC: 31.6 g/dL (ref 30.0–36.0)
MCV: 91.4 fL (ref 78.0–100.0)
MONO ABS: 1.2 10*3/uL — AB (ref 0.1–1.0)
Monocytes Relative: 14 % — ABNORMAL HIGH (ref 3–12)
Neutro Abs: 5.5 10*3/uL (ref 1.7–7.7)
Neutrophils Relative %: 61 % (ref 43–77)
Platelets: 216 10*3/uL (ref 150–400)
RBC: 4.98 MIL/uL (ref 4.22–5.81)
RDW: 14 % (ref 11.5–15.5)
WBC: 9.1 10*3/uL (ref 4.0–10.5)

## 2015-07-03 LAB — BLOOD GAS, ARTERIAL
Acid-Base Excess: 0.2 mmol/L (ref 0.0–2.0)
Bicarbonate: 25.2 mEq/L — ABNORMAL HIGH (ref 20.0–24.0)
Drawn by: 213101
O2 Content: 3 L/min
O2 Saturation: 97.8 %
PATIENT TEMPERATURE: 37
PCO2 ART: 47.4 mmHg — AB (ref 35.0–45.0)
TCO2: 22.1 mmol/L (ref 0–100)
pH, Arterial: 7.345 — ABNORMAL LOW (ref 7.350–7.450)
pO2, Arterial: 123 mmHg — ABNORMAL HIGH (ref 80.0–100.0)

## 2015-07-03 LAB — GLUCOSE, CAPILLARY
GLUCOSE-CAPILLARY: 128 mg/dL — AB (ref 65–99)
Glucose-Capillary: 164 mg/dL — ABNORMAL HIGH (ref 65–99)
Glucose-Capillary: 114 mg/dL — ABNORMAL HIGH (ref 65–99)

## 2015-07-03 LAB — BASIC METABOLIC PANEL
Anion gap: 7 (ref 5–15)
BUN: 9 mg/dL (ref 6–20)
CALCIUM: 8.5 mg/dL — AB (ref 8.9–10.3)
CO2: 29 mmol/L (ref 22–32)
Chloride: 107 mmol/L (ref 101–111)
Creatinine, Ser: 0.74 mg/dL (ref 0.61–1.24)
GFR calc Af Amer: >60 mL/min (ref >60)
GLUCOSE: 102 mg/dL — AB (ref 65–99)
POTASSIUM: 4.3 mmol/L (ref 3.5–5.1)
Sodium: 143 mmol/L (ref 135–145)

## 2015-07-03 LAB — BRAIN NATRIURETIC PEPTIDE: B Natriuretic Peptide: 28 pg/mL (ref 0.0–100.0)

## 2015-07-03 LAB — TROPONIN I: Troponin I: <0.03 ng/mL (ref <0.031)

## 2015-07-03 MED ORDER — LEVALBUTEROL HCL 0.63 MG/3ML IN NEBU: 0.6300 mg | INHALATION_SOLUTION | Freq: Four times a day (QID) | RESPIRATORY_TRACT | Status: DC | PRN

## 2015-07-03 MED ORDER — HYDROCODONE-ACETAMINOPHEN 5-325 MG PO TABS
1.0000 | ORAL_TABLET | Freq: Four times a day (QID) | ORAL | Status: DC | PRN
  Administered 2015-07-03 (×3): 1 via ORAL
  Filled 2015-07-03 (×3): qty 1

## 2015-07-03 MED ORDER — LEVALBUTEROL HCL 0.63 MG/3ML IN NEBU: 0.6300 mg | INHALATION_SOLUTION | Freq: Four times a day (QID) | RESPIRATORY_TRACT | Status: DC

## 2015-07-03 MED ORDER — ONDANSETRON HCL 4 MG/2ML IJ SOLN
4.0000 mg | Freq: Four times a day (QID) | INTRAMUSCULAR | Status: DC | PRN
Start: 2015-07-03 — End: 2015-07-05
  Filled 2015-07-03: qty 2

## 2015-07-03 MED ORDER — GABAPENTIN 300 MG PO CAPS
600.0000 mg | ORAL_CAPSULE | Freq: Three times a day (TID) | ORAL | Status: DC
  Administered 2015-07-03 – 2015-07-05 (×7): 600 mg via ORAL
  Filled 2015-07-03 (×7): qty 2

## 2015-07-03 MED ORDER — LEVOFLOXACIN IN D5W 750 MG/150ML IV SOLN
750.0000 mg | INTRAVENOUS | Status: DC
  Filled 2015-07-03: qty 150

## 2015-07-03 MED ORDER — APIXABAN 5 MG PO TABS
5.0000 mg | ORAL_TABLET | Freq: Two times a day (BID) | ORAL | Status: DC
  Administered 2015-07-03 – 2015-07-05 (×5): 5 mg via ORAL
  Filled 2015-07-03 (×5): qty 1

## 2015-07-03 MED ORDER — IPRATROPIUM BROMIDE 0.02 % IN SOLN
0.5000 mg | Freq: Once | RESPIRATORY_TRACT | Status: AC
  Administered 2015-07-03: 0.5 mg via RESPIRATORY_TRACT
  Filled 2015-07-03: qty 2.5

## 2015-07-03 MED ORDER — ACETAMINOPHEN 325 MG PO TABS: 650.0000 mg | ORAL_TABLET | Freq: Four times a day (QID) | ORAL | Status: DC | PRN

## 2015-07-03 MED ORDER — ONDANSETRON HCL 4 MG/2ML IJ SOLN
4.0000 mg | Freq: Four times a day (QID) | INTRAMUSCULAR | Status: DC | PRN
  Administered 2015-07-03 – 2015-07-04 (×3): 4 mg via INTRAVENOUS
  Filled 2015-07-03 (×2): qty 2

## 2015-07-03 MED ORDER — ACETAMINOPHEN 650 MG RE SUPP
650.0000 mg | Freq: Four times a day (QID) | RECTAL | Status: DC | PRN
Start: 2015-07-03 — End: 2015-07-05

## 2015-07-03 MED ORDER — ONDANSETRON HCL 4 MG PO TABS
4.0000 mg | ORAL_TABLET | Freq: Four times a day (QID) | ORAL | Status: DC | PRN
  Administered 2015-07-03: 4 mg via ORAL
  Filled 2015-07-03: qty 1

## 2015-07-03 MED ORDER — LEVALBUTEROL HCL 1.25 MG/0.5ML IN NEBU
INHALATION_SOLUTION | RESPIRATORY_TRACT | Status: AC
  Filled 2015-07-03: qty 0.5

## 2015-07-03 MED ORDER — METOPROLOL TARTRATE 50 MG PO TABS
50.0000 mg | ORAL_TABLET | Freq: Two times a day (BID) | ORAL | Status: DC
  Administered 2015-07-03 – 2015-07-05 (×5): 50 mg via ORAL
  Filled 2015-07-03 (×5): qty 1

## 2015-07-03 MED ORDER — INSULIN ASPART 100 UNIT/ML ~~LOC~~ SOLN
0.0000 [IU] | Freq: Three times a day (TID) | SUBCUTANEOUS | Status: DC
  Administered 2015-07-03: 3 [IU] via SUBCUTANEOUS
  Administered 2015-07-03 – 2015-07-05 (×5): 2 [IU] via SUBCUTANEOUS

## 2015-07-03 MED ORDER — SIMVASTATIN 20 MG PO TABS
40.0000 mg | ORAL_TABLET | Freq: Every evening | ORAL | Status: DC
  Administered 2015-07-03 – 2015-07-04 (×2): 40 mg via ORAL
  Filled 2015-07-03 (×2): qty 2

## 2015-07-03 MED ORDER — INSULIN ASPART 100 UNIT/ML ~~LOC~~ SOLN: 0.0000 [IU] | Freq: Every day | SUBCUTANEOUS | Status: DC

## 2015-07-03 MED ORDER — LEVALBUTEROL HCL 1.25 MG/0.5ML IN NEBU
1.2500 mg | INHALATION_SOLUTION | Freq: Four times a day (QID) | RESPIRATORY_TRACT | Status: DC
Start: 2015-07-03 — End: 2015-07-05
  Administered 2015-07-03 – 2015-07-05 (×6): 1.25 mg via RESPIRATORY_TRACT
  Filled 2015-07-03 (×6): qty 0.5

## 2015-07-03 MED ORDER — LEVALBUTEROL HCL 1.25 MG/0.5ML IN NEBU
1.2500 mg | INHALATION_SOLUTION | Freq: Four times a day (QID) | RESPIRATORY_TRACT | Status: DC | PRN
  Administered 2015-07-04 – 2015-07-05 (×2): 1.25 mg via RESPIRATORY_TRACT
  Filled 2015-07-03 (×2): qty 0.5

## 2015-07-03 MED ORDER — METHYLPREDNISOLONE SODIUM SUCC 125 MG IJ SOLR
60.0000 mg | Freq: Four times a day (QID) | INTRAMUSCULAR | Status: DC
  Administered 2015-07-03 – 2015-07-05 (×9): 60 mg via INTRAVENOUS
  Filled 2015-07-03 (×10): qty 2

## 2015-07-03 MED ORDER — LEVOFLOXACIN IN D5W 500 MG/100ML IV SOLN
500.0000 mg | Freq: Once | INTRAVENOUS | Status: AC
  Administered 2015-07-03: 500 mg via INTRAVENOUS
  Filled 2015-07-03: qty 100

## 2015-07-03 MED ORDER — LEVALBUTEROL HCL 1.25 MG/0.5ML IN NEBU
1.2500 mg | INHALATION_SOLUTION | Freq: Four times a day (QID) | RESPIRATORY_TRACT | Status: DC
  Administered 2015-07-03 (×3): 1.25 mg via RESPIRATORY_TRACT
  Filled 2015-07-03 (×3): qty 0.5

## 2015-07-03 MED ORDER — METHYLPREDNISOLONE SODIUM SUCC 125 MG IJ SOLR
80.0000 mg | Freq: Once | INTRAMUSCULAR | Status: DC
  Filled 2015-07-03: qty 2

## 2015-07-03 MED ORDER — SODIUM CHLORIDE 0.9 % IJ SOLN
3.0000 mL | Freq: Two times a day (BID) | INTRAMUSCULAR | Status: DC
  Administered 2015-07-03 – 2015-07-05 (×5): 3 mL via INTRAVENOUS

## 2015-07-03 MED ORDER — METHYLPREDNISOLONE SODIUM SUCC 125 MG IJ SOLR
125.0000 mg | Freq: Once | INTRAMUSCULAR | Status: AC
  Administered 2015-07-03: 125 mg via INTRAVENOUS

## 2015-07-03 MED ORDER — PANTOPRAZOLE SODIUM 40 MG PO TBEC
40.0000 mg | DELAYED_RELEASE_TABLET | Freq: Every day | ORAL | Status: DC
  Administered 2015-07-03 – 2015-07-05 (×3): 40 mg via ORAL
  Filled 2015-07-03 (×3): qty 1

## 2015-07-03 MED ORDER — ALBUTEROL (5 MG/ML) CONTINUOUS INHALATION SOLN
15.0000 mg/h | INHALATION_SOLUTION | Freq: Once | RESPIRATORY_TRACT | Status: AC
  Administered 2015-07-03: 15 mg/h via RESPIRATORY_TRACT
  Filled 2015-07-03: qty 20

## 2015-07-03 NOTE — Progress Notes (Signed)
Patient is a 65 year old man with a history of O2 and steroid dependent COPD, atrial fibrillation on anticoagulation, PVD-status post bypass, and CAD, who was admitted this morning by Dr. Marin Comment for shortness of breath, secondary to COPD exacerbation. The patient was briefly seen and examined. His chart, vital signs, laboratory studies were reviewed. Agree with management as ordered and started.  -We'll add sliding scale NovoLog for anticipated steroid-induced hyperglycemia.

## 2015-07-03 NOTE — ED Notes (Signed)
Pt c/o sob, speaking in short phrases and using accessory muscles.

## 2015-07-03 NOTE — Care Management Note (Signed)
Case Management Note  Patient Details  Name: Kenneth Merritt MRN: [id number] Date of Birth: November 03, 1951  Expected Discharge Date:      07/05/2015            Expected Discharge Plan:  [place]  In-House Referral:  NA  Discharge planning Services     Post Acute Care Choice:    Choice offered to:     DME Arranged:    DME Agency:     HH Arranged:  Respirator Therapy HH Agency:  [facility]  Status of Service:  In process, will continue to follow  Medicare Important Message Given:    Date Medicare IM Given:    Medicare IM give by:    Date Additional Medicare IM Given:    Additional Medicare Important Message give by:     If discussed at [place] of Stay Meetings, dates discussed:    Additional Comments: Pt is from home, lives with his wife and granddaughter. Pt admitted with COPD exacerbation. Pt has home O2 through [facility] and says his tanks are too heavy for him to carry. [facility] has been referred to assess patient for need for port concentrator. Pt has neb machine at home. Pt plans to return home at DC. CM will cont to follow for DC planning.  Sherald Barge, RN 07/03/2015, 1:32 PM

## 2015-07-03 NOTE — H&P (Signed)
[facility] History and Physical  Kenneth Merritt LNL:[id number] DOB: 1951/06/29    PCP:   Neale Burly, MD   Chief Complaint: Can't breath.   HPI:  Kenneth Merritt is an 64 y.o. male with hx COPD on home oxygen and chronic steroids, afib on anticoagulation, hx of GERD, PVD s/p bypass, RAS, prstate cancer, CAD, presented to the ER with 2 days history of worsening baseline SOB. EMS was summoned.  I last admitted him in May 2016 for same presentation, and he had only required one day of hospitalization.  He did not smoke and denied being exposed to second hand smoke.  He also endorsed yellow productive coughs, but no fever or chills. Evalaution in the ER showed CXR with no PNA, normal WBC , with normal renal fx tests. He was given neb Tx and IV steroids, along with IV Levaquin, and hospitalist was asked to admit him for COPD exacerbation.  He report he has not been under the care of a pulmonologist.    Rewiew of Systems:  Constitutional: Negative for malaise, fever and chills. No significant weight loss or weight gain Eyes: Negative for eye pain, redness and discharge, diplopia, visual changes, or flashes of light. ENMT: Negative for ear pain, hoarseness, nasal congestion, sinus pressure and sore throat. No headaches; tinnitus, drooling, or problem swallowing. Cardiovascular: Negative for chest pain, palpitations, diaphoresis, and peripheral edema. ; No orthopnea, PND Respiratory: Negative for  hemoptysis, wheezing and stridor. No pleuritic chestpain. Gastrointestinal: Negative for nausea, vomiting, diarrhea, constipation, abdominal pain, melena, blood in stool, hematemesis, jaundice and rectal bleeding.    Genitourinary: Negative for frequency, dysuria, incontinence,flank pain and hematuria; Musculoskeletal: Negative for back pain and neck pain. Negative for swelling and trauma.;  Skin: . Negative for pruritus, rash, abrasions, bruising and skin lesion.; ulcerations Neuro: Negative for  headache, lightheadedness and neck stiffness. Negative for weakness, altered level of consciousness , altered mental status, extremity weakness, burning feet, involuntary movement, seizure and syncope.  Psych: negative for anxiety, depression, insomnia, tearfulness, panic attacks, hallucinations, paranoia, suicidal or homicidal ideation    Past Medical History  Diagnosis Date  . GERD (gastroesophageal reflux disease)   . COPD (chronic obstructive pulmonary disease)     severe by PFTs August 2010  . 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 Atrial fibrillation     post-op a-fib. 3/11. post Aortobifem , /  Atrial fibrillation from nebulizer treatment  May, 2012, while hospitalized  . Renal artery stenosis      presumably corrected at time of aortobifem surgery March, 2011  . Tobacco user      stop smoking  . Pneumothorax      2011  before aortobifemoral surgery,  resolved  . Peripheral vascular disease, unspecified     bilateral intermittent claudication  . CAD (coronary artery disease), native coronary artery     catheterization 06/2009.Marland KitchenMarland Kitchen70% proximal LAD and 30% proximal RCA. normal LV function, no intervention planned prior to surgery for aorta. LV normal function  by catheter 8/10.   [name] Dyslipidemia   . DJD (degenerative joint disease)   . Ejection fraction     60%, echo, May, 2012, rapid atrial fib at the time  . Cancer 2013    Prostate Radiation  . Myocardial infarction 2011    Past Surgical History  Procedure Laterality Date  . Abdominal aortic aneurysm repair  01/11/2010  . Pr vein bypass graft,aorto-fem-pop    . Inguinal hernia repair      RIGHT    Medications:  HOME MEDS: Prior to Admission medications   Medication Sig Start Date End Date Taking? Authorizing Provider  albuterol (PROVENTIL HFA;VENTOLIN HFA) 108 (90 BASE) MCG/ACT inhaler Inhale 2 puffs into the lungs every 4 (four)  hours as needed for wheezing or shortness of breath. 01/17/15   Kristen N Ward, DO  albuterol (PROVENTIL) (2.5 MG/3ML) 0.083% nebulizer solution Take 3 mLs (2.5 mg total) by nebulization every 4 (four) hours as needed for wheezing or shortness of breath. 03/15/15   [name], MD  apixaban (ELIQUIS) 5 MG TABS tablet Take 1 tablet (5 mg total) by mouth 2 (two) times daily. 06/20/15   [name], MD  gabapentin (NEURONTIN) 300 MG capsule Take by mouth 3 (three) times daily. 900pm AM 600mg  pm 900mg  pm 02/13/14   Historical Provider, MD  guaiFENesin (ROBITUSSIN) 100 MG/5ML liquid Take 5-10 mLs (100-200 mg total) by mouth every 4 (four) hours as needed for cough. 01/17/15   Kristen N Ward, DO  HYDROcodone-acetaminophen (NORCO/VICODIN) 5-325 MG per tablet Take 1 tablet by mouth every 6 (six) hours as needed for moderate pain.    Historical Provider, MD  metoprolol (LOPRESSOR) 50 MG tablet Take 50 mg by mouth 2 (two) times daily. 12/27/13   Historical Provider, MD  nitroGLYCERIN (NITROSTAT) 0.4 MG SL tablet Place 1 tablet (0.4 mg total) under the tongue every 5 (five) minutes x 3 doses as needed for chest pain. 06/11/15   [name], MD  nystatin (MYCOSTATIN) 100000 UNIT/ML suspension  04/27/15   Historical Provider, MD  omeprazole (PRILOSEC) 20 MG capsule Take 1 capsule by mouth every morning.  04/21/11   Historical Provider, MD  predniSONE (DELTASONE) 10 MG tablet Take 60mg  po daily for 2 days then 40mg  po daily for 2 days then 30mg  po daily for 2 days then 20mg  po daily for 2 days then 10mg  po daily Patient taking differently: Take 10 mg by mouth daily with breakfast. 10mg  po daily 03/15/15   [name], MD  simvastatin (ZOCOR) 40 MG tablet Take 1 tablet (40 mg total) by mouth every evening. 06/25/15   [name], MD     Allergies:  No Known Allergies  Social History:   reports that he quit smoking about 5 years ago. His smoking use included Cigarettes. He has a 80 pack-year smoking history. He  has never used smokeless tobacco. He reports that he does not drink alcohol or use illicit drugs.  Family History: Family History  Problem Relation Age of Onset  . Cancer Father     prostate  . Heart attack Father      Physical Exam: Filed Vitals:   07/03/15 0330 07/03/15 0340 07/03/15 0400 07/03/15 0500  BP: 126/92  110/86 94/66  Pulse: 99  106 120  Resp: 20  14 16   SpO2: 96% 100% 100% 100%   Blood pressure 94/66, pulse 120, resp. rate 16, SpO2 100 %.  GEN:  Pleasant  patient lying in the stretcher in no acute distress; cooperative with exam. PSYCH:  alert and oriented x4; does not appear anxious or depressed; affect is appropriate. HEENT: Mucous membranes pink and anicteric; PERRLA; EOM intact; no cervical lymphadenopathy nor thyromegaly or carotid bruit; no JVD; There were no stridor. Neck is very supple. Breasts:: Not examined CHEST WALL: No tenderness CHEST: Normal respiration, tightly wheezing, and no rales.  HEART:  Regular rate and rhythm.  There are no murmur, rub, or gallops.   BACK: No kyphosis or scoliosis; no CVA tenderness ABDOMEN: soft and non-tender; no masses, no organomegaly, normal abdominal bowel sounds; no pannus; no intertriginous candida. There is no rebound and no distention. Rectal Exam: Not done EXTREMITIES: No bone or joint deformity; age-appropriate arthropathy of the hands and knees; no edema; no ulcerations.  There is no calf tenderness. Genitalia: not examined PULSES: 2+ and symmetric SKIN: Normal hydration no rash or ulceration CNS: Cranial nerves 2-12 grossly intact no focal lateralizing neurologic deficit.  Speech is fluent; uvula elevated with phonation, facial symmetry and tongue midline. DTR are normal bilaterally, cerebella exam is intact, barbinski is negative and strengths are equaled bilaterally.  No sensory loss.   Labs on Admission:  Basic Metabolic Panel:  Recent Labs Lab 07/03/15 0345  NA 143  K 4.3  CL 107  CO2 29  GLUCOSE  102*  BUN 9  CREATININE 0.74  CALCIUM 8.5*   CBC:  Recent Labs Lab 07/03/15 0345  WBC 9.1  NEUTROABS 5.5  HGB 14.4  HCT 45.5  MCV 91.4  PLT 216   Cardiac Enzymes:  Recent Labs Lab 07/03/15 0345  TROPONINI <0.03   Radiological Exams on Admission: Dg Chest Portable 1 View  07/03/2015   CLINICAL DATA:  Shortness of breath this morning.  EXAM: PORTABLE CHEST - 1 VIEW  COMPARISON:  05/24/2015  FINDINGS: The lungs are hyperinflated with emphysema. The cardiomediastinal contours are unchanged, heart size is normal. There is a prominent epicardial fat pad. Biapical scarring is again seen. Pulmonary vasculature is normal. No consolidation, pleural effusion, or pneumothorax. No acute osseous abnormalities are seen.  IMPRESSION: Emphysema without acute pulmonary process.   Electronically Signed   By: Jeb Levering M.D.   On: 07/03/2015 04:02    Assessment/Plan Present on Admission:  . COPD (chronic obstructive pulmonary disease) . Acute respiratory failure . PAF (paroxysmal atrial fibrillation) . Renal artery stenosis . COPD with exacerbation  PLAN: PLAN: Will admit him for COPD exacerbation. Will continue with IV Solumedrol, nebs, and will continue with IV antibiotics (inpatient tx of COPD exacerbation).SInce he is tachycardic, will change neb to Xopenex.  Please be cognizant that he is on chronic steroid when transitioning him to oral steroids. For his afib, he is rate controlled, and will continue his anticoagulation. He will be placed on telemetry. This time, I would like to consult Dr Luan Pulling of pulmonary for any further recommendation.   He is stable, full code, and will be admitted to [facility] service. Dr Natasha Bence, thank you for allowing me to participate in the care of your nice patient.   Other plans as per orders.  Code Status: FULL Haskel Khan, MD. [facility] Pager [phone] 7pm to 7am.  07/03/2015, 5:21 AM

## 2015-07-03 NOTE — ED Provider Notes (Signed)
CSN: [id number]     Arrival date & time 07/03/15  0326 History   First MD Initiated Contact with Patient 07/03/15 [phone]     Chief Complaint  Patient presents with  . Shortness of Breath     (Consider location/radiation/quality/duration/timing/severity/associated sxs/prior Treatment) HPI  This is a 64 year old male with a history of COPD, coronary artery disease, atrial fibrillation who presents with shortness of breath. Patient reports 2 to three-day history of worsening shortness of breath. Denies any cough or fever. Denies any chest pain. Has used his albuterol home with minimal relief. States that it got worse tonight. Denies any lower extremity swelling. Reports history of claustrophobia and inability to tolerate BiPAP.  Reports that he uses oxygen "sometimes" at home.  Level V caveat for acuity of condition  Past Medical History  Diagnosis Date  . GERD (gastroesophageal reflux disease)   . COPD (chronic obstructive pulmonary disease)     severe by PFTs August 2010  . 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 Atrial fibrillation     post-op a-fib. 3/11. post Aortobifem , /  Atrial fibrillation from nebulizer treatment  May, 2012, while hospitalized  . Renal artery stenosis      presumably corrected at time of aortobifem surgery March, 2011  . Tobacco user      stop smoking  . Pneumothorax      2011  before aortobifemoral surgery,  resolved  . Peripheral vascular disease, unspecified     bilateral intermittent claudication  . CAD (coronary artery disease), native coronary artery     catheterization 06/2009.Marland KitchenMarland Kitchen70% proximal LAD and 30% proximal RCA. normal LV function, no intervention planned prior to surgery for aorta. LV normal function  by catheter 8/10.   [name] Dyslipidemia   . DJD (degenerative joint disease)   . Ejection fraction     60%, echo, May, 2012, rapid atrial fib at the time  .  Cancer 2013    Prostate Radiation  . Myocardial infarction 2011   Past Surgical History  Procedure Laterality Date  . Abdominal aortic aneurysm repair  01/11/2010  . Pr vein bypass graft,aorto-fem-pop    . Inguinal hernia repair      RIGHT   Family History  Problem Relation Age of Onset  . Cancer Father     prostate  . Heart attack Father    Social History  Substance Use Topics  . Smoking status: Former Smoker -- 2.00 packs/day for 40 years    Types: Cigarettes    Quit date: 01/11/2010  . Smokeless tobacco: Never Used  . Alcohol Use: No    Review of Systems  Constitutional: Negative.  Negative for fever.  Respiratory: Positive for shortness of breath and wheezing. Negative for chest tightness.   Cardiovascular: Negative.  Negative for chest pain and leg swelling.  Gastrointestinal: Negative.  Negative for abdominal pain.  Genitourinary: Negative.  Negative for dysuria.  Musculoskeletal: Negative for back pain.  Skin: Negative for rash.  Neurological: Negative for headaches.  All other systems reviewed and are negative.     Allergies  Review of patient's allergies indicates no known allergies.  Home Medications   Prior to Admission medications   Medication Sig Start Date End Date Taking? Authorizing Provider  albuterol (PROVENTIL HFA;VENTOLIN HFA) 108 (90 BASE) MCG/ACT inhaler Inhale 2 puffs into the lungs every 4 (four) hours as needed for wheezing  or shortness of breath. 01/17/15   Kristen N Ward, DO  albuterol (PROVENTIL) (2.5 MG/3ML) 0.083% nebulizer solution Take 3 mLs (2.5 mg total) by nebulization every 4 (four) hours as needed for wheezing or shortness of breath. 03/15/15   [name], MD  apixaban (ELIQUIS) 5 MG TABS tablet Take 1 tablet (5 mg total) by mouth 2 (two) times daily. 06/20/15   [name], MD  gabapentin (NEURONTIN) 300 MG capsule Take by mouth 3 (three) times daily. 900pm AM 600mg  pm 900mg  pm 02/13/14   Historical Provider, MD  guaiFENesin  (ROBITUSSIN) 100 MG/5ML liquid Take 5-10 mLs (100-200 mg total) by mouth every 4 (four) hours as needed for cough. 01/17/15   Kristen N Ward, DO  HYDROcodone-acetaminophen (NORCO/VICODIN) 5-325 MG per tablet Take 1 tablet by mouth every 6 (six) hours as needed for moderate pain.    Historical Provider, MD  metoprolol (LOPRESSOR) 50 MG tablet Take 50 mg by mouth 2 (two) times daily. 12/27/13   Historical Provider, MD  nitroGLYCERIN (NITROSTAT) 0.4 MG SL tablet Place 1 tablet (0.4 mg total) under the tongue every 5 (five) minutes x 3 doses as needed for chest pain. 06/11/15   [name], MD  nystatin (MYCOSTATIN) 100000 UNIT/ML suspension  04/27/15   Historical Provider, MD  omeprazole (PRILOSEC) 20 MG capsule Take 1 capsule by mouth every morning.  04/21/11   Historical Provider, MD  predniSONE (DELTASONE) 10 MG tablet Take 60mg  po daily for 2 days then 40mg  po daily for 2 days then 30mg  po daily for 2 days then 20mg  po daily for 2 days then 10mg  po daily Patient taking differently: Take 10 mg by mouth daily with breakfast. 10mg  po daily 03/15/15   [name], MD  simvastatin (ZOCOR) 40 MG tablet Take 1 tablet (40 mg total) by mouth every evening. 06/25/15   [name], MD   BP 126/92 mmHg  Pulse 99  Resp 20  SpO2 100% Physical Exam  Constitutional: He is oriented to person, place, and time. He appears well-developed and well-nourished.  Chronically ill-appearing, tachypnea  HENT:  Head: Normocephalic and atraumatic.  Cardiovascular: Normal rate, regular rhythm and normal heart sounds.   No murmur heard. Pulmonary/Chest: He is in respiratory distress. He has wheezes.  Tachypnea, very tight, scant expiratory wheeze Midline sternotomy scar  Abdominal: Soft. Bowel sounds are normal. There is no tenderness. There is no rebound.  Musculoskeletal: He exhibits no edema.  Neurological: He is alert and oriented to person, place, and time.  Skin: Skin is warm and dry.  Psychiatric: He has a normal  mood and affect.  Nursing note and vitals reviewed.   ED Course  Procedures (including critical care time)  CRITICAL CARE Performed by: Merryl Hacker   Total critical care time: 35 min  Critical care time was exclusive of separately billable procedures and treating other patients.  Critical care was necessary to treat or prevent imminent or life-threatening deterioration.  Critical care was time spent personally by me on the following activities: development of treatment plan with patient and/or surrogate as well as nursing, discussions with consultants, evaluation of patient's response to treatment, examination of patient, obtaining history from patient or surrogate, ordering and performing treatments and interventions, ordering and review of laboratory studies, ordering and review of radiographic studies, pulse oximetry and re-evaluation of patient's condition.  Labs Review Labs Reviewed  CBC WITH DIFFERENTIAL/PLATELET - Abnormal; Notable for the following:    Monocytes Relative 14 (*)  Monocytes Absolute 1.2 (*)    All other components within normal limits  BASIC METABOLIC PANEL - Abnormal; Notable for the following:    Glucose, Bld 102 (*)    Calcium 8.5 (*)    All other components within normal limits  BLOOD GAS, ARTERIAL - Abnormal; Notable for the following:    pH, Arterial 7.345 (*)    pCO2 arterial 47.4 (*)    pO2, Arterial 123 (*)    Bicarbonate 25.2 (*)    All other components within normal limits  TROPONIN I  BRAIN NATRIURETIC PEPTIDE    Imaging Review Dg Chest Portable 1 View  07/03/2015   CLINICAL DATA:  Shortness of breath this morning.  EXAM: PORTABLE CHEST - 1 VIEW  COMPARISON:  05/24/2015  FINDINGS: The lungs are hyperinflated with emphysema. The cardiomediastinal contours are unchanged, heart size is normal. There is a prominent epicardial fat pad. Biapical scarring is again seen. Pulmonary vasculature is normal. No consolidation, pleural effusion,  or pneumothorax. No acute osseous abnormalities are seen.  IMPRESSION: Emphysema without acute pulmonary process.   Electronically Signed   By: Jeb Levering M.D.   On: 07/03/2015 04:02   I have personally reviewed and evaluated these images and lab results as part of my medical decision-making.   EKG Interpretation   Date/Time:  Tuesday July 03 2015 03:31:00 EDT Ventricular Rate:  108 PR Interval:  128 QRS Duration: 146 QT Interval:  384 QTC Calculation: 515 R Axis:   -68 Text Interpretation:  Sinus tachycardia Nonspecific IVCD with LAD  tachycardia, otherwise no significant change Confirmed by Kimbella Heisler  MD,  [facility] ([id number]) on 07/03/2015 3:39:01 AM      MDM   Final diagnoses:  COPD exacerbation    Patient presents with shortness of breath. Progressive over the last several days. Severity And tight on initial evaluation.  O2 sats 96% on 2 L. Patient given steroid-induced, antibiotics, and continuous DuoNeb. Has previously not tolerated BiPAP. ABG shows acute respiratory acidosis. Comparable to prior admission. Lab work is otherwise reassuring. EKG is nonischemic. Chest x-ray shows emphysematous change without pneumonia.  4:30 AM Patient feels improved on recheck. Moving more air but continues to be tight. DuoNeb in place.  5:01 AM Following one hour DuoNeb, patient with improved aeration but continues to have diffuse wheezing and rhonchi.  Full patient will likely need admission for frequent duo nebs.    Merryl Hacker, MD 07/03/15 [phone]

## 2015-07-03 NOTE — ED Notes (Signed)
   07/03/15 0348  Respiratory  Respiratory (WDL) X  Bilateral Breath Sounds Expiratory wheezes;Rhonchi  Respiratory Pattern Labored;Prolonged exhalation  Chest Assessment Chest expansion symmetrical  O2 Device Nasal Cannula  O2 Flow Rate (L/min) 2 L/min  Pt says started getting SOB about 3 hours ago. Took breathing treatment at home w/ little relief. Got a treatment by ems said he felt a little better. Pt present w/ congested sounding cough.

## 2015-07-04 DIAGNOSIS — J9621 Acute and chronic respiratory failure with hypoxia: Secondary | ICD-10-CM

## 2015-07-04 DIAGNOSIS — J441 Chronic obstructive pulmonary disease with (acute) exacerbation: Principal | ICD-10-CM

## 2015-07-04 DIAGNOSIS — I480 Paroxysmal atrial fibrillation: Secondary | ICD-10-CM

## 2015-07-04 DIAGNOSIS — D72829 Elevated white blood cell count, unspecified: Secondary | ICD-10-CM

## 2015-07-04 LAB — BASIC METABOLIC PANEL
Anion gap: 9 (ref 5–15)
BUN: 13 mg/dL (ref 6–20)
CALCIUM: 9.4 mg/dL (ref 8.9–10.3)
CHLORIDE: 103 mmol/L (ref 101–111)
CO2: 29 mmol/L (ref 22–32)
CREATININE: 0.68 mg/dL (ref 0.61–1.24)
GFR calc non Af Amer: >60 mL/min (ref >60)
Glucose, Bld: 137 mg/dL — ABNORMAL HIGH (ref 65–99)
Potassium: 5 mmol/L (ref 3.5–5.1)
SODIUM: 141 mmol/L (ref 135–145)

## 2015-07-04 LAB — GLUCOSE, CAPILLARY
GLUCOSE-CAPILLARY: 124 mg/dL — AB (ref 65–99)
GLUCOSE-CAPILLARY: 150 mg/dL — AB (ref 65–99)
Glucose-Capillary: 125 mg/dL — ABNORMAL HIGH (ref 65–99)
Glucose-Capillary: 125 mg/dL — ABNORMAL HIGH (ref 65–99)

## 2015-07-04 LAB — CBC
HCT: 44.9 % (ref 39.0–52.0)
Hemoglobin: 14.3 g/dL (ref 13.0–17.0)
MCH: 29.1 pg (ref 26.0–34.0)
MCHC: 31.8 g/dL (ref 30.0–36.0)
MCV: 91.3 fL (ref 78.0–100.0)
PLATELETS: 262 10*3/uL (ref 150–400)
RBC: 4.92 MIL/uL (ref 4.22–5.81)
RDW: 13.8 % (ref 11.5–15.5)
WBC: 18.3 10*3/uL — AB (ref 4.0–10.5)

## 2015-07-04 MED ORDER — GUAIFENESIN ER 600 MG PO TB12
1200.0000 mg | ORAL_TABLET | Freq: Two times a day (BID) | ORAL | Status: DC
  Administered 2015-07-04 – 2015-07-05 (×2): 1200 mg via ORAL
  Filled 2015-07-04 (×2): qty 2

## 2015-07-04 MED ORDER — BUDESONIDE-FORMOTEROL FUMARATE 160-4.5 MCG/ACT IN AERO
2.0000 | INHALATION_SPRAY | Freq: Two times a day (BID) | RESPIRATORY_TRACT | Status: DC
  Administered 2015-07-04 – 2015-07-05 (×3): 2 via RESPIRATORY_TRACT
  Filled 2015-07-04: qty 6

## 2015-07-04 MED ORDER — ROFLUMILAST 500 MCG PO TABS
500.0000 ug | ORAL_TABLET | Freq: Every day | ORAL | Status: DC
  Administered 2015-07-04 – 2015-07-05 (×2): 500 ug via ORAL
  Filled 2015-07-04 (×2): qty 1

## 2015-07-04 MED ORDER — DEXTROSE 5 % IV SOLN
1.0000 g | INTRAVENOUS | Status: DC
  Filled 2015-07-04 (×2): qty 10

## 2015-07-04 MED ORDER — AZITHROMYCIN 500 MG IV SOLR
500.0000 mg | INTRAVENOUS | Status: DC
Start: 2015-07-04 — End: 2015-07-05
  Filled 2015-07-04 (×2): qty 500

## 2015-07-04 MED ORDER — DEXTROSE 5 % IV SOLN
INTRAVENOUS | Status: AC
  Filled 2015-07-04: qty 500

## 2015-07-04 NOTE — Progress Notes (Signed)
[facility] PROGRESS NOTE  COLBI STAUBS EEF:[id number] DOB: Oct 25, 1951 DOA: 07/03/2015 PCP: Neale Burly, MD  Assessment/Plan: 1. COPD exacerbation. Patient admitted to the hospital with worsening shortness of breath. He has been started on intravenous steroids, antibiotics and bronchodilators. Pulmonology following, appreciate assistance. He is still short of breath and has diminished breath sounds. Will continue current treatments for now.  2. Acute on chronic respiratory failure. Currently on supplemental oxygen. Related to COPD exacerbation. Continue treatments as above. 3. PAF. rate controlled on metoprolol, will continue anticoagulation with Eliquis  4. Leukocytosis. Likely related to steroids. He does not appear to be septic or toxic. Will continue to monitor. 5. GERD continue PPI 6. Hyperlipidemia, continue statin 7. HTN. Currently stable. Continue metoprolol  Code Status: Full DVT prophylaxis: Eliquis  Family Communication: discussed with patient, no family present Disposition Plan: discharge home once improved   Consultants:  Pulmonology- Dr. Luan Pulling    Procedures:    Antibiotics:  Levaquin 8/24>>8/24  Rocephin 8/24  Azithromycin 8/24  HPI/Subjective: Patient is feeling better today. He reports that he is starting to expectorate some sputum. Wheezing improving.   Objective: Filed Vitals:   07/04/15 0536  BP: 132/82  Pulse: 81  Temp: 97.8 F (36.6 C)  Resp: 22    Intake/Output Summary (Last 24 hours) at 07/04/15 0651 Last data filed at 07/04/15 0500  Gross per 24 hour  Intake    543 ml  Output   1325 ml  Net   -782 ml   Filed Weights   07/03/15 0615  Weight: 64.91 kg (143 lb 1.6 oz)    Exam: General:  Appears calm and comfortable Eyes: PERRL, normal lids, irises & conjunctiva ENT: grossly normal hearing, lips & tongue Neck: no LAD, masses or thyromegaly Cardiovascular: RRR, no m/r/g. No LE edema. Respiratory: diminished breath sounds  bilaterally, no wheezing. Mildly increased resp effort. Abdomen: soft, ntnd Skin: no rash or induration seen on limited exam Musculoskeletal: grossly normal tone BUE/BLE Psychiatric: grossly normal mood and affect, speech fluent and appropriate Neurologic: grossly non-focal.  Data Reviewed: Basic Metabolic Panel:  Recent Labs Lab 07/03/15 0345  NA 143  K 4.3  CL 107  CO2 29  GLUCOSE 102*  BUN 9  CREATININE 0.74  CALCIUM 8.5*   Liver Function Tests: No results for input(s): AST, ALT, ALKPHOS, BILITOT, PROT, ALBUMIN in the last 168 hours. No results for input(s): LIPASE, AMYLASE in the last 168 hours. No results for input(s): AMMONIA in the last 168 hours. CBC:  Recent Labs Lab 07/03/15 0345  WBC 9.1  NEUTROABS 5.5  HGB 14.4  HCT 45.5  MCV 91.4  PLT 216   Cardiac Enzymes:  Recent Labs Lab 07/03/15 0345  TROPONINI <0.03   BNP (last 3 results)  Recent Labs  11/26/14 2101 01/17/15 0029 07/03/15 0345  BNP 33.6 42.0 28.0    ProBNP (last 3 results) No results for input(s): PROBNP in the last 8760 hours.  CBG:  Recent Labs Lab 07/03/15 1239 07/03/15 1627 07/03/15 2058  GLUCAP 164* 128* 114*    No results found for this or any previous visit (from the past 240 hour(s)).   Studies: Dg Chest Portable 1 View  07/03/2015   CLINICAL DATA:  Shortness of breath this morning.  EXAM: PORTABLE CHEST - 1 VIEW  COMPARISON:  05/24/2015  FINDINGS: The lungs are hyperinflated with emphysema. The cardiomediastinal contours are unchanged, heart size is normal. There is a prominent epicardial fat pad. Biapical scarring is again seen.  Pulmonary vasculature is normal. No consolidation, pleural effusion, or pneumothorax. No acute osseous abnormalities are seen.  IMPRESSION: Emphysema without acute pulmonary process.   Electronically Signed   By: Jeb Levering M.D.   On: 07/03/2015 04:02    Scheduled Meds: . apixaban  5 mg Oral BID  . gabapentin  600 mg Oral TID  .  insulin aspart  0-15 Units Subcutaneous TID WC  . insulin aspart  0-5 Units Subcutaneous QHS  . levalbuterol  1.25 mg Nebulization QID  . levofloxacin (LEVAQUIN) IV  750 mg Intravenous Q24H  . methylPREDNISolone (SOLU-MEDROL) injection  60 mg Intravenous Q6H  . metoprolol  50 mg Oral BID  . pantoprazole  40 mg Oral Daily  . simvastatin  40 mg Oral QPM  . sodium chloride  3 mL Intravenous Q12H   Continuous Infusions:   Active Problems:   COPD (chronic obstructive pulmonary disease)   S/P aortobifemoral bypass surgery   Renal artery stenosis   PAF (paroxysmal atrial fibrillation)   Acute respiratory failure   HX: anticoagulation   COPD with exacerbation    Time spent: 54minues    [name], MD   07/04/2015. [facility] Pager [phone]. If 7PM-7AM, please contact night-coverage at [email or web address], password [facility] 07/04/2015, 6:51 AM  LOS: 1 day

## 2015-07-04 NOTE — Progress Notes (Signed)
RT instructed patient on usage of flutter valve, patient tolerated well and exhibits understanding of device.

## 2015-07-04 NOTE — Consult Note (Signed)
Consult requested by: [facility] Consult requested for a COPD:  HPI: This is a 64 year old who has had problems with COPD for several years, much worse in the last 5 or so. His last PFT was in August 2010 and showed severe airflow obstruction. He says he started having trouble after he was hospitalized here for an approximately 24-hour hospitalization and once he got home he was good for about 2 weeks and then started having problems again. He says he has exacerbations of COPD at least twice a month and requires antibiotics for increasing his dose of chronic steroids or ends up in the hospital. He stopped smoking about 5 years ago. In addition to his COPD he has a history of pneumothorax. 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 He says when he is doing well at home he can go to his mailbox and get his mail and get back to the house and do fairly well. He takes 10 mg of prednisone daily. He says he tried Spiriva in the past but it made him feel dizzy.  Past Medical History  Diagnosis Date  . GERD (gastroesophageal reflux disease)   . COPD (chronic obstructive pulmonary disease)     severe by PFTs August 2010  . 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 Atrial fibrillation     post-op a-fib. 3/11. post Aortobifem , /  Atrial fibrillation from nebulizer treatment  May, 2012, while hospitalized  . Renal artery stenosis      presumably corrected at time of aortobifem surgery March, 2011  . Tobacco user      stop smoking  . Pneumothorax      2011  before aortobifemoral surgery,  resolved  . Peripheral vascular disease, unspecified     bilateral intermittent claudication  . CAD (coronary artery disease), native coronary artery     catheterization 06/2009.Marland KitchenMarland Kitchen70%  proximal LAD and 30% proximal RCA. normal LV function, no intervention planned prior to surgery for aorta. LV normal function  by catheter 8/10.   [name] Dyslipidemia   . DJD (degenerative joint disease)   . Ejection fraction     60%, echo, May, 2012, rapid atrial fib at the time  . Cancer 2013    Prostate Radiation  . Myocardial infarction 2011     Family History  Problem Relation Age of Onset  . Cancer Father     prostate  . Heart attack Father      Social History   Social History  . Marital Status: Married    Spouse Name: N/A  . Number of Children: N/A  . Years of Education: N/A   Social History Main Topics  . Smoking status: Former Smoker -- 2.00 packs/day for 40 years    Types: Cigarettes    Quit date: 01/11/2010  . Smokeless tobacco: Never Used  . Alcohol Use: No  . Drug Use: No  . Sexual Activity: Not Asked   Other Topics Concern  . None   Social History Narrative   Married, retired.      ROS: He denies chest pain hemoptysis night sweats fever or chills. The rest per the history and physical    Objective: Vital signs in last 24 hours: Temp:  [97.8 F (36.6 C)-98 F (36.7 C)] 97.8 F (36.6 C) (  08/24 0536) Pulse Rate:  [81-106] 81 (08/24 0536) Resp:  [20-22] 22 (08/24 0536) BP: (91-142)/(66-82) 132/82 mmHg (08/24 0536) SpO2:  [96 %-99 %] 98 % (08/24 0755) Weight change:  Last BM Date: 07/02/15  Intake/Output from previous day: 08/23 0701 - 08/24 0700 In: 543 [P.O.:540; I.V.:3] Out: 1325 [Urine:1325]  PHYSICAL EXAM He is awake and alert and coughing during the examination. He looks relatively comfortable. He is mildly short of breath with conversation. He is very short of breath when he exerts himself his HEENT examination is generally unremarkable and his neck is supple without JVD. His chest shows diminished breath sounds no wheezing and prolonged expiratory phase. His heart is irregular with a rate of about 80 now. His abdomen is soft he has multiple  surgical scars and his extremities showed no edema. Central nervous system examination is grossly intact  Lab Results: Basic Metabolic Panel:  Recent Labs  07/03/15 0345 07/04/15 0541  NA 143 141  K 4.3 5.0  CL 107 103  CO2 29 29  GLUCOSE 102* 137*  BUN 9 13  CREATININE 0.74 0.68  CALCIUM 8.5* 9.4   Liver Function Tests: No results for input(s): AST, ALT, ALKPHOS, BILITOT, PROT, ALBUMIN in the last 72 hours. No results for input(s): LIPASE, AMYLASE in the last 72 hours. No results for input(s): AMMONIA in the last 72 hours. CBC:  Recent Labs  07/03/15 0345 07/04/15 0541  WBC 9.1 18.3*  NEUTROABS 5.5  --   HGB 14.4 14.3  HCT 45.5 44.9  MCV 91.4 91.3  PLT 216 262   Cardiac Enzymes:  Recent Labs  07/03/15 0345  TROPONINI <0.03   BNP: No results for input(s): PROBNP in the last 72 hours. D-Dimer: No results for input(s): DDIMER in the last 72 hours. CBG:  Recent Labs  07/03/15 1239 07/03/15 1627 07/03/15 2058  GLUCAP 164* 128* 114*   Hemoglobin A1C: No results for input(s): HGBA1C in the last 72 hours. Fasting Lipid Panel: No results for input(s): CHOL, HDL, LDLCALC, TRIG, CHOLHDL, LDLDIRECT in the last 72 hours. Thyroid Function Tests: No results for input(s): TSH, T4TOTAL, FREET4, T3FREE, THYROIDAB in the last 72 hours. Anemia Panel: No results for input(s): VITAMINB12, FOLATE, FERRITIN, TIBC, IRON, RETICCTPCT in the last 72 hours. Coagulation: No results for input(s): LABPROT, INR in the last 72 hours. Urine Drug Screen: Drugs of Abuse     Component Value Date/Time   LABOPIA NONE DETECTED 01/21/2010 1927   COCAINSCRNUR NONE DETECTED 01/21/2010 1927   LABBENZ NONE DETECTED 01/21/2010 1927   AMPHETMU NONE DETECTED 01/21/2010 1927   THCU NONE DETECTED 01/21/2010 1927   LABBARB  01/21/2010 1927    NONE DETECTED        DRUG SCREEN FOR MEDICAL PURPOSES ONLY.  IF CONFIRMATION IS NEEDED FOR ANY PURPOSE, NOTIFY LAB WITHIN 5 DAYS.        LOWEST  DETECTABLE LIMITS FOR URINE DRUG SCREEN Drug Class       Cutoff (ng/mL) Amphetamine      1000 Barbiturate      200 Benzodiazepine   161 Tricyclics       096 Opiates          300 Cocaine          300 THC              50    Alcohol Level: No results for input(s): ETH in the last 72 hours. Urinalysis: No results for input(s): COLORURINE, LABSPEC, [place], [place], [place], [place],  KETONESUR, PROTEINUR, UROBILINOGEN, NITRITE, LEUKOCYTESUR in the last 72 hours.  Invalid input(s): APPERANCEUR Misc. Labs:   ABGS:  Recent Labs  07/03/15 0350  PHART 7.345*  PO2ART 123*  TCO2 22.1  HCO3 25.2*     MICROBIOLOGY: No results found for this or any previous visit (from the past 240 hour(s)).  Studies/Results: Dg Chest Portable 1 View  07/03/2015   CLINICAL DATA:  Shortness of breath this morning.  EXAM: PORTABLE CHEST - 1 VIEW  COMPARISON:  05/24/2015  FINDINGS: The lungs are hyperinflated with emphysema. The cardiomediastinal contours are unchanged, heart size is normal. There is a prominent epicardial fat pad. Biapical scarring is again seen. Pulmonary vasculature is normal. No consolidation, pleural effusion, or pneumothorax. No acute osseous abnormalities are seen.  IMPRESSION: Emphysema without acute pulmonary process.   Electronically Signed   By: Jeb Levering M.D.   On: 07/03/2015 04:02    Medications:  Prior to Admission:  Prescriptions prior to admission  Medication Sig Dispense Refill Last Dose  . albuterol (PROVENTIL HFA;VENTOLIN HFA) 108 (90 BASE) MCG/ACT inhaler Inhale 2 puffs into the lungs every 4 (four) hours as needed for wheezing or shortness of breath. 1 Inhaler 0 Past Week at Unknown time  . albuterol (PROVENTIL) (2.5 MG/3ML) 0.083% nebulizer solution Take 3 mLs (2.5 mg total) by nebulization every 4 (four) hours as needed for wheezing or shortness of breath. 75 mL 12 Past Week at Unknown time  . apixaban (ELIQUIS) 5 MG TABS tablet Take 1 tablet (5 mg total)  by mouth 2 (two) times daily. 60 tablet 6 07/02/2015 at 2000  . gabapentin (NEURONTIN) 300 MG capsule Take 600 mg by mouth 3 (three) times daily.    07/02/2015 at Unknown time  . HYDROcodone-acetaminophen (NORCO/VICODIN) 5-325 MG per tablet Take 1 tablet by mouth every 6 (six) hours as needed for moderate pain.   07/02/2015 at Unknown time  . metoprolol (LOPRESSOR) 50 MG tablet Take 50 mg by mouth 2 (two) times daily.   07/02/2015 at 2000  . nitroGLYCERIN (NITROSTAT) 0.4 MG SL tablet Place 1 tablet (0.4 mg total) under the tongue every 5 (five) minutes x 3 doses as needed for chest pain. 25 tablet 3 Past Month at Unknown time  . omeprazole (PRILOSEC) 20 MG capsule Take 1 capsule by mouth every morning.    07/02/2015 at Unknown time  . predniSONE (DELTASONE) 10 MG tablet Take 60mg  po daily for 2 days then 40mg  po daily for 2 days then 30mg  po daily for 2 days then 20mg  po daily for 2 days then 10mg  po daily (Patient taking differently: Take 10 mg by mouth daily with breakfast. 10mg  po daily) 32 tablet 0 07/02/2015 at Unknown time  . simvastatin (ZOCOR) 40 MG tablet Take 1 tablet (40 mg total) by mouth every evening. 30 tablet 2 07/02/2015 at Unknown time   Scheduled: . apixaban  5 mg Oral BID  . budesonide-formoterol  2 puff Inhalation BID  . gabapentin  600 mg Oral TID  . guaiFENesin  1,200 mg Oral BID  . insulin aspart  0-15 Units Subcutaneous TID WC  . insulin aspart  0-5 Units Subcutaneous QHS  . levalbuterol  1.25 mg Nebulization QID  . levofloxacin (LEVAQUIN) IV  750 mg Intravenous Q24H  . methylPREDNISolone (SOLU-MEDROL) injection  60 mg Intravenous Q6H  . metoprolol  50 mg Oral BID  . pantoprazole  40 mg Oral Daily  . roflumilast  500 mcg Oral Daily  . simvastatin  40 mg  Oral QPM  . sodium chloride  3 mL Intravenous Q12H   Continuous:  [id number] **OR** acetaminophen, HYDROcodone-acetaminophen, levalbuterol, ondansetron **OR** ondansetron (ZOFRAN) IV, ondansetron (ZOFRAN)  IV  Assesment: He has severe COPD. He is not really on any maintenance medications except prednisone. He says he did not tolerate Spiriva well so I think he's probably a candidate for one of the other similar anti-cholinergic  medications like Incruse. This may help reduce his exacerbations. He also should be on one of the combined steroid/LABA combinations because that may reduce his exacerbations. I started him on Roflumilast because that may reduce his exacerbations. He has oxygen at home already but he says he doesn't use it all the time. He may be a candidate for pulmonary rehabilitation as an outpatient but I did not address that with him at this visit Active Problems:   COPD (chronic obstructive pulmonary disease)   S/P aortobifemoral bypass surgery   Renal artery stenosis   PAF (paroxysmal atrial fibrillation)   Acute respiratory failure   HX: anticoagulation   COPD with exacerbation    Plan: Medication changes were made.Keenan Bachelor  is not available in the hospital so that will need to be started as an outpatient. I would continue with his other treatments including his IV steroids and antibiotics for now.  Thanks for allowing me to see him with you    LOS: 1 day   Kadejah Sandiford L 07/04/2015, 7:58 AM

## 2015-07-04 NOTE — Care Management Note (Signed)
Case Management Note  Patient Details  Name: DEVIAN BARTOLOMEI MRN: [id number] Date of Birth: [birth year]   Expected Discharge Date:                  Expected Discharge Plan:  [place]  In-House Referral:  NA  Discharge planning Services     Post Acute Care Choice:    Choice offered to:     DME Arranged:    DME Agency:     HH Arranged:  Respirator Therapy HH Agency:  [place] [facility]  Status of Service:  In process, will continue to follow  Medicare Important Message Given:    Date Medicare IM Given:    Medicare IM give by:    Date Additional Medicare IM Given:    Additional Medicare Important Message give by:     If discussed at [place] of Stay Meetings, dates discussed:    Additional Comments: Benefits check completed for Incruse and Roflumilast, both are $3.60 for 30-day supply.  Sherald Barge, RN 07/04/2015, 2:55 PM

## 2015-07-04 NOTE — Progress Notes (Signed)
Patient refused to take night time antibiotics.  Patient stated that he will tell doctor in am.  Patient stated that he would only like to take po antibiotics.  Patient is fearful because stated he had a bad experience with levaquin ( with gi bleeding).  Iv antibiotics were started, but then stopped upon patient request.  Patient became nauseated once zithromax was running, nausea medicine given.  Patient still refused iv antibiotics after nausea medication administered.

## 2015-07-04 NOTE — Progress Notes (Signed)
Patient is refusing Levofloxacin, patient states it makes him bleed from rectum. Will notify day shift RN to notify Dr Caryn Section in order to get new orders.

## 2015-07-05 LAB — GLUCOSE, CAPILLARY: Glucose-Capillary: 136 mg/dL — ABNORMAL HIGH (ref 65–99)

## 2015-07-05 MED ORDER — PREDNISONE 10 MG PO TABS: ORAL_TABLET | ORAL | Status: DC

## 2015-07-05 MED ORDER — AZITHROMYCIN 500 MG PO TABS: 500.0000 mg | ORAL_TABLET | Freq: Every day | ORAL | Status: DC

## 2015-07-05 MED ORDER — GUAIFENESIN ER 600 MG PO TB12: 600.0000 mg | ORAL_TABLET | Freq: Two times a day (BID) | ORAL | Status: DC

## 2015-07-05 NOTE — Progress Notes (Signed)
Subjective: He says he feels well and wants to go home.  Objective: Vital signs in last 24 hours: Temp:  [97.5 F (36.4 C)-97.8 F (36.6 C)] 97.5 F (36.4 C) (08/25 0536) Pulse Rate:  [80-109] 80 (08/25 0536) Resp:  [20-22] 20 (08/25 0536) BP: (115-136)/(77-86) 115/77 mmHg (08/25 0536) SpO2:  [95 %-100 %] 98 % (08/25 0740) Weight change:  Last BM Date: 07/02/15  Intake/Output from previous day: 08/24 0701 - 08/25 0700 In: 160 [P.O.:160] Out: 1550 [Urine:1550]  PHYSICAL EXAM General appearance: alert, cooperative and no distress Resp: Clear with diminished breath sounds Cardio: I can't tell for sure by exam if he is in atrial fibrillation or not. He does not have a gallop GI: soft, non-tender; bowel sounds normal; no masses,  no organomegaly Extremities: extremities normal, atraumatic, no cyanosis or edema  Lab Results:  Results for orders placed or performed during the hospital encounter of 07/03/15 (from the past 48 hour(s))  Glucose, capillary     Status: Abnormal   Collection Time: 07/03/15 12:39 PM  Result Value Ref Range   Glucose-Capillary 164 (H) 65 - 99 mg/dL  Glucose, capillary     Status: Abnormal   Collection Time: 07/03/15  4:27 PM  Result Value Ref Range   Glucose-Capillary 128 (H) 65 - 99 mg/dL   Comment 1 Notify RN    Comment 2 Document in Chart   Glucose, capillary     Status: Abnormal   Collection Time: 07/03/15  8:58 PM  Result Value Ref Range   Glucose-Capillary 114 (H) 65 - 99 mg/dL  Basic metabolic panel     Status: Abnormal   Collection Time: 07/04/15  5:41 AM  Result Value Ref Range   Sodium 141 135 - 145 mmol/L   Potassium 5.0 3.5 - 5.1 mmol/L   Chloride 103 101 - 111 mmol/L   CO2 29 22 - 32 mmol/L   Glucose, Bld 137 (H) 65 - 99 mg/dL   BUN 13 6 - 20 mg/dL   Creatinine, Ser 0.68 0.61 - 1.24 mg/dL   Calcium 9.4 8.9 - 10.3 mg/dL   GFR calc non Af Amer >60 >60 mL/min   GFR calc Af Amer >60 >60 mL/min    Comment: (NOTE) The eGFR has been  calculated using the CKD EPI equation. This calculation has not been validated in all clinical situations. eGFR's persistently <60 mL/min signify possible Chronic Kidney Disease.    Anion gap 9 5 - 15  CBC     Status: Abnormal   Collection Time: 07/04/15  5:41 AM  Result Value Ref Range   WBC 18.3 (H) 4.0 - 10.5 K/uL   RBC 4.92 4.22 - 5.81 MIL/uL   Hemoglobin 14.3 13.0 - 17.0 g/dL   HCT 44.9 39.0 - 52.0 %   MCV 91.3 78.0 - 100.0 fL   MCH 29.1 26.0 - 34.0 pg   MCHC 31.8 30.0 - 36.0 g/dL   RDW 13.8 11.5 - 15.5 %   Platelets 262 150 - 400 K/uL  Glucose, capillary     Status: Abnormal   Collection Time: 07/04/15  7:55 AM  Result Value Ref Range   Glucose-Capillary 125 (H) 65 - 99 mg/dL  Glucose, capillary     Status: Abnormal   Collection Time: 07/04/15 11:14 AM  Result Value Ref Range   Glucose-Capillary 125 (H) 65 - 99 mg/dL  Glucose, capillary     Status: Abnormal   Collection Time: 07/04/15  4:20 PM  Result Value Ref Range  Glucose-Capillary 124 (H) 65 - 99 mg/dL  Glucose, capillary     Status: Abnormal   Collection Time: 07/04/15  8:52 PM  Result Value Ref Range   Glucose-Capillary 150 (H) 65 - 99 mg/dL  Glucose, capillary     Status: Abnormal   Collection Time: 07/05/15  7:35 AM  Result Value Ref Range   Glucose-Capillary 136 (H) 65 - 99 mg/dL    ABGS  Recent Labs  07/03/15 0350  PHART 7.345*  PO2ART 123*  TCO2 22.1  HCO3 25.2*   CULTURES No results found for this or any previous visit (from the past 240 hour(s)). Studies/Results: No results found.  Medications:  Prior to Admission:  Prescriptions prior to admission  Medication Sig Dispense Refill Last Dose  . albuterol (PROVENTIL HFA;VENTOLIN HFA) 108 (90 BASE) MCG/ACT inhaler Inhale 2 puffs into the lungs every 4 (four) hours as needed for wheezing or shortness of breath. 1 Inhaler 0 Past Week at Unknown time  . albuterol (PROVENTIL) (2.5 MG/3ML) 0.083% nebulizer solution Take 3 mLs (2.5 mg total) by  nebulization every 4 (four) hours as needed for wheezing or shortness of breath. 75 mL 12 Past Week at Unknown time  . apixaban (ELIQUIS) 5 MG TABS tablet Take 1 tablet (5 mg total) by mouth 2 (two) times daily. 60 tablet 6 07/02/2015 at 2000  . gabapentin (NEURONTIN) 300 MG capsule Take 600 mg by mouth 3 (three) times daily.    07/02/2015 at Unknown time  . HYDROcodone-acetaminophen (NORCO/VICODIN) 5-325 MG per tablet Take 1 tablet by mouth every 6 (six) hours as needed for moderate pain.   07/02/2015 at Unknown time  . metoprolol (LOPRESSOR) 50 MG tablet Take 50 mg by mouth 2 (two) times daily.   07/02/2015 at 2000  . nitroGLYCERIN (NITROSTAT) 0.4 MG SL tablet Place 1 tablet (0.4 mg total) under the tongue every 5 (five) minutes x 3 doses as needed for chest pain. 25 tablet 3 Past Month at Unknown time  . omeprazole (PRILOSEC) 20 MG capsule Take 1 capsule by mouth every morning.    07/02/2015 at Unknown time  . predniSONE (DELTASONE) 10 MG tablet Take 28m po daily for 2 days then 431mpo daily for 2 days then 3064mo daily for 2 days then 3m48m daily for 2 days then 10mg65mdaily (Patient taking differently: Take 10 mg by mouth daily with breakfast. 10mg 10maily) 32 tablet 0 07/02/2015 at Unknown time  . simvastatin (ZOCOR) 40 MG tablet Take 1 tablet (40 mg total) by mouth every evening. 30 tablet 2 07/02/2015 at Unknown time   Scheduled: . apixaban  5 mg Oral BID  . azithromycin  500 mg Intravenous Q24H  . budesonide-formoterol  2 puff Inhalation BID  . cefTRIAXone (ROCEPHIN)  IV  1 g Intravenous Q24H  . gabapentin  600 mg Oral TID  . guaiFENesin  1,200 mg Oral BID  . insulin aspart  0-15 Units Subcutaneous TID WC  . insulin aspart  0-5 Units Subcutaneous QHS  . levalbuterol  1.25 mg Nebulization QID  . methylPREDNISolone (SOLU-MEDROL) injection  60 mg Intravenous Q6H  . metoprolol  50 mg Oral BID  . pantoprazole  40 mg Oral Daily  . roflumilast  500 mcg Oral Daily  . simvastatin  40 mg Oral  QPM  . sodium chloride  3 mL Intravenous Q12H   Continuous:  PRN:acAST:[id number]* acetaminophen, HYDROcodone-acetaminophen, levalbuterol, ondansetron **OR** ondansetron (ZOFRAN) IV, ondansetron (ZOFRAN) IV  Assesment: He was admitted with  COPD exacerbation. He has acute on chronic respiratory failure. He has improved. He says he is back to baseline  He has paroxysmal atrial fibrillation and by exam I can't tell for sure if he is in atrial fib or not. Active Problems:   COPD (chronic obstructive pulmonary disease)   S/P aortobifemoral bypass surgery   PAF (paroxysmal atrial fibrillation)   Leukocytosis   Acute respiratory failure   HX: anticoagulation   COPD with exacerbation    Plan: If he is able to go home today he can come by my office and I will either give him samples of incruse, or write him a prescription with a coupon to give him 1 free month or both.    LOS: 2 days   Maddison Kilner L 07/05/2015, 8:12 AM

## 2015-07-05 NOTE — Discharge Summary (Signed)
Physician Discharge Summary  Kenneth Merritt EXB:[id number] DOB: [birth year] DOA: 07/03/2015  PCP: Neale Burly, MD  Admit date: 07/03/2015 Discharge date: 07/05/2015  Time spent: 35 minutes  Recommendations for Outpatient Follow-up:  1. Follow-up with Dr. Luan Pulling as instructed for new med trial within a week. 2. Follow up with PCP within 1-2 weeks 3. Pt was offered [facility] but refused   Discharge Diagnoses:  Active Problems:   COPD (chronic obstructive pulmonary disease), steroid dependent   S/P aortobifemoral bypass surgery   PAF (paroxysmal atrial fibrillation)   Leukocytosis   Acute on chronic respiratory failure   HX: anticoagulation   COPD with exacerbation   Discharge Condition: improved   Diet recommendation: Heart Healthy   Filed Weights   07/03/15 0615  Weight: 64.91 kg (143 lb 1.6 oz)    History of present illness:  Kenneth Merritt is an 64 y.o. male with hx COPD on home oxygen and chronic steroids, afib on anticoagulation, hx of GERD, PVD s/p bypass, RAS, prostate cancer, CAD, presented to the ER with 2 days history of worsening baseline SOB. He was last admitted in May 2016 for same presentation, and he had only required one day of hospitalization. Evaluation in the ER showed CXR with no PNA, normal WBC, with normal renal function tests. He was given neb Tx and IV steroids, along with IV Levaquin, and he was admitted for further w/u of COPD exacerbation with pulmonology to consult.   Hospital Course:  Pt admitted for Acute on chronic respiratory failure and COPD exacerbation.Pt was treated with IV abx, steroids and bronchodilators. He was followed in consultation with pulmonology. With treatment pt has improved and appears to be approaching his baseline. He appears to have advanced lung disease at baseline, steroid dependant and uses O2 as needed at home. He will continue on O2 24/7 until he follows up with pulmonology. He has transitioned to Prednisone  taper and oral abx. He will be discharged with course of Azithromycin and Prednisone.     1. PAF. Rate controlled on metoprolol, will continue anticoagulation with Eliquis.  2. Leukocytosis. Likely related to steroids. He does not appear to be septic or toxic.  3. GERD continue PPI 4. Hyperlipidemia, continue statin. 5. HTN. Currently stable. Will continue metoprolol.   Consultants:  Pulmonology- Dr. Luan Pulling  Procedures:    Antibiotics:  Levaquin 8/24>>8/24  Rocephin 8/24>>8/25  Azithromycin 8/24>>8/25  Discharge Exam: Filed Vitals:   07/05/15 0815  BP: 121/83  Pulse: 89  Temp: 98.5 F (36.9 C)  Resp: 20     General: Appears calm and comfortable  Eyes: PERRL  Cardiovascular: Regular rate and rhythm, no murmur, rubs, or gallop. No BLE edema.  Respiratory:Diminished BS with faint expiratory wheezes  Musculoskeletal: FROM   Psychiatric: normal mood and affect   Neurologic: A&Ox3  Discharge Instructions   Discharge Instructions    Diet - low sodium heart healthy    Complete by:  As directed      Increase activity slowly    Complete by:  As directed           Current Discharge Medication List    START taking these medications   Details  azithromycin (ZITHROMAX) 500 MG tablet Take 1 tablet (500 mg total) by mouth daily. Qty: 2 tablet, Refills: 0    guaiFENesin (MUCINEX) 600 MG 12 hr tablet Take 1 tablet (600 mg total) by mouth 2 (two) times daily. Qty: 20 tablet, Refills: 0  CONTINUE these medications which have CHANGED   Details  predniSONE (DELTASONE) 10 MG tablet Take 60mg  po daily for 2 days then 40mg  po daily for 2 days then 30mg  po daily for 2 days then 20mg  po daily for 2 days then 10mg  po daily Qty: 32 tablet, Refills: 0      CONTINUE these medications which have NOT CHANGED   Details  albuterol (PROVENTIL HFA;VENTOLIN HFA) 108 (90 BASE) MCG/ACT inhaler Inhale 2 puffs into the lungs every 4 (four) hours as needed for  wheezing or shortness of breath. Qty: 1 Inhaler, Refills: 0    albuterol (PROVENTIL) (2.5 MG/3ML) 0.083% nebulizer solution Take 3 mLs (2.5 mg total) by nebulization every 4 (four) hours as needed for wheezing or shortness of breath. Qty: 75 mL, Refills: 12    apixaban (ELIQUIS) 5 MG TABS tablet Take 1 tablet (5 mg total) by mouth 2 (two) times daily. Qty: 60 tablet, Refills: 6    gabapentin (NEURONTIN) 300 MG capsule Take 600 mg by mouth 3 (three) times daily.     HYDROcodone-acetaminophen (NORCO/VICODIN) 5-325 MG per tablet Take 1 tablet by mouth every 6 (six) hours as needed for moderate pain.    metoprolol (LOPRESSOR) 50 MG tablet Take 50 mg by mouth 2 (two) times daily.    nitroGLYCERIN (NITROSTAT) 0.4 MG SL tablet Place 1 tablet (0.4 mg total) under the tongue every 5 (five) minutes x 3 doses as needed for chest pain. Qty: 25 tablet, Refills: 3    omeprazole (PRILOSEC) 20 MG capsule Take 1 capsule by mouth every morning.     simvastatin (ZOCOR) 40 MG tablet Take 1 tablet (40 mg total) by mouth every evening. Qty: 30 tablet, Refills: 2       No Known Allergies Follow-up Information    Follow up with HAWKINS,EDWARD L, MD. Schedule an appointment as soon as possible for a visit in 1 week.   Specialty:  Pulmonary Disease   Contact information:   [address] [address] [phone]       Follow up with Neale Burly, MD. Schedule an appointment as soon as possible for a visit in 2 weeks.   Specialty:  Internal Medicine   Contact information:   [place] [address] [phone]        The results of significant diagnostics from this hospitalization (including imaging, microbiology, ancillary and laboratory) are listed below for reference.    Significant Diagnostic Studies: Dg Chest Portable 1 View  07/03/2015   CLINICAL DATA:  Shortness of breath this morning.  EXAM: PORTABLE CHEST - 1 VIEW  COMPARISON:  05/24/2015   FINDINGS: The lungs are hyperinflated with emphysema. The cardiomediastinal contours are unchanged, heart size is normal. There is a prominent epicardial fat pad. Biapical scarring is again seen. Pulmonary vasculature is normal. No consolidation, pleural effusion, or pneumothorax. No acute osseous abnormalities are seen.  IMPRESSION: Emphysema without acute pulmonary process.   Electronically Signed   By: Jeb Levering M.D.   On: 07/03/2015 04:02    Microbiology: No results found for this or any previous visit (from the past 240 hour(s)).   Labs: Basic Metabolic Panel:  Recent Labs Lab 07/03/15 0345 07/04/15 0541  NA 143 141  K 4.3 5.0  CL 107 103  CO2 29 29  GLUCOSE 102* 137*  BUN 9 13  CREATININE 0.74 0.68  CALCIUM 8.5* 9.4   Liver Function Tests: No results for input(s): AST, ALT,  ALKPHOS, BILITOT, PROT, ALBUMIN in the last 168 hours. No results for input(s): LIPASE, AMYLASE in the last 168 hours. No results for input(s): AMMONIA in the last 168 hours. CBC:  Recent Labs Lab 07/03/15 0345 07/04/15 0541  WBC 9.1 18.3*  NEUTROABS 5.5  --   HGB 14.4 14.3  HCT 45.5 44.9  MCV 91.4 91.3  PLT 216 262   Cardiac Enzymes:  Recent Labs Lab 07/03/15 0345  TROPONINI <0.03   BNP: BNP (last 3 results)  Recent Labs  11/26/14 2101 01/17/15 0029 07/03/15 0345  BNP 33.6 42.0 28.0    ProBNP (last 3 results) No results for input(s): PROBNP in the last 8760 hours.  CBG:  Recent Labs Lab 07/04/15 0755 07/04/15 1114 07/04/15 1620 07/04/15 2052 07/05/15 0735  GLUCAP 125* 125* 124* 150* 136*       Signed:  [name], M.D. [facility] 07/05/2015, 10:17 AM    I, Norg'e Tisdol, acting a scribe, recorded this note contemporaneously in the presence of Dr. [name], M.D. on 07/05/2015 at 10:17 AM  I have reviewed the above documentation for accuracy and completeness, and I agree with the above.  Ugo Thoma

## 2015-07-05 NOTE — Care Management Note (Signed)
Case Management Note  Patient Details  Name: KONG PACKETT MRN: [id number] Date of Birth: [birth year]   Expected Discharge Date:                  Expected Discharge Plan:  [place]  In-House Referral:  NA  Discharge planning Services     Post Acute Care Choice:    Choice offered to:     DME Arranged:    DME Agency:     HH Arranged:  Respirator Therapy HH Agency:  [facility]  Status of Service:  Completed, signed off  Medicare Important Message Given:    Date Medicare IM Given:    Medicare IM give by:    Date Additional Medicare IM Given:    Additional Medicare Important Message give by:     If discussed at [place] of Stay Meetings, dates discussed:    Additional Comments: Pt is discharging home today. [facility] made aware so they may notifiy RT to do assessment for light-weight port O2 concentrator. Pt has no further CM needs.  Sherald Barge, RN 07/05/2015, 10:21 AM

## 2015-07-05 NOTE — Progress Notes (Signed)
Pt's IV catheter removed and intact. Pt's IV site clean dry and intact. Discharge instructions, medications and follow up appointments reviewed and discussed with patient. All questions answered and no further questions at this time. Pt in stable condition at time of discharge and in no acute distress. Pt escorted by nurse tech.

## 2015-07-13 ENCOUNTER — Other Ambulatory Visit (HOSPITAL_COMMUNITY): Payer: Self-pay | Admitting: Respiratory Therapy

## 2015-07-13 DIAGNOSIS — J441 Chronic obstructive pulmonary disease with (acute) exacerbation: Secondary | ICD-10-CM

## 2015-07-20 ENCOUNTER — Ambulatory Visit (HOSPITAL_COMMUNITY): Admission: RE | Admit: 2015-07-20 | Payer: Medicare Other | Source: Ambulatory Visit

## 2015-07-26 ENCOUNTER — Other Ambulatory Visit: Payer: Self-pay | Admitting: Cardiology

## 2015-08-01 ENCOUNTER — Ambulatory Visit (HOSPITAL_COMMUNITY): Admission: RE | Admit: 2015-08-01 | Payer: Medicare Other | Source: Ambulatory Visit

## 2015-08-02 ENCOUNTER — Emergency Department (HOSPITAL_COMMUNITY)
Admission: EM | Admit: 2015-08-02 | Discharge: 2015-08-02 | Disposition: A | Discharge status: Home / Self Care | Payer: Medicare Other | Attending: Emergency Medicine | Admitting: Emergency Medicine

## 2015-08-02 ENCOUNTER — Encounter (HOSPITAL_COMMUNITY): Payer: Self-pay | Admitting: Emergency Medicine

## 2015-08-02 ENCOUNTER — Emergency Department (HOSPITAL_COMMUNITY): Payer: Medicare Other

## 2015-08-02 DIAGNOSIS — Z7901 Long term (current) use of anticoagulants: Secondary | ICD-10-CM | POA: Diagnosis not present

## 2015-08-02 DIAGNOSIS — I252 Old myocardial infarction: Secondary | ICD-10-CM | POA: Diagnosis not present

## 2015-08-02 DIAGNOSIS — Z7952 Long term (current) use of systemic steroids: Secondary | ICD-10-CM | POA: Diagnosis not present

## 2015-08-02 DIAGNOSIS — Z87891 Personal history of nicotine dependence: Secondary | ICD-10-CM | POA: Insufficient documentation

## 2015-08-02 DIAGNOSIS — J441 Chronic obstructive pulmonary disease with (acute) exacerbation: Secondary | ICD-10-CM | POA: Diagnosis not present

## 2015-08-02 DIAGNOSIS — K219 Gastro-esophageal reflux disease without esophagitis: Secondary | ICD-10-CM | POA: Diagnosis not present

## 2015-08-02 DIAGNOSIS — Z9981 Dependence on supplemental oxygen: Secondary | ICD-10-CM | POA: Diagnosis not present

## 2015-08-02 DIAGNOSIS — I2510 Atherosclerotic heart disease of native coronary artery without angina pectoris: Secondary | ICD-10-CM | POA: Insufficient documentation

## 2015-08-02 DIAGNOSIS — I4891 Unspecified atrial fibrillation: Secondary | ICD-10-CM | POA: Diagnosis not present

## 2015-08-02 DIAGNOSIS — Z8546 Personal history of malignant neoplasm of prostate: Secondary | ICD-10-CM | POA: Diagnosis not present

## 2015-08-02 DIAGNOSIS — E785 Hyperlipidemia, unspecified: Secondary | ICD-10-CM | POA: Insufficient documentation

## 2015-08-02 DIAGNOSIS — Z8739 Personal history of other diseases of the musculoskeletal system and connective tissue: Secondary | ICD-10-CM | POA: Insufficient documentation

## 2015-08-02 DIAGNOSIS — R0602 Shortness of breath: Secondary | ICD-10-CM | POA: Diagnosis present

## 2015-08-02 DIAGNOSIS — Z79899 Other long term (current) drug therapy: Secondary | ICD-10-CM | POA: Insufficient documentation

## 2015-08-02 LAB — BRAIN NATRIURETIC PEPTIDE: B Natriuretic Peptide: 42 pg/mL (ref 0.0–100.0)

## 2015-08-02 LAB — BASIC METABOLIC PANEL
ANION GAP: 6 (ref 5–15)
BUN: 9 mg/dL (ref 6–20)
CO2: 25 mmol/L (ref 22–32)
Calcium: 8.5 mg/dL — ABNORMAL LOW (ref 8.9–10.3)
Chloride: 110 mmol/L (ref 101–111)
Creatinine, Ser: 0.63 mg/dL (ref 0.61–1.24)
GFR calc Af Amer: >60 mL/min (ref >60)
GLUCOSE: 119 mg/dL — AB (ref 65–99)
POTASSIUM: 3.8 mmol/L (ref 3.5–5.1)
Sodium: 141 mmol/L (ref 135–145)

## 2015-08-02 LAB — CBC
HEMATOCRIT: 40 % (ref 39.0–52.0)
Hemoglobin: 12.8 g/dL — ABNORMAL LOW (ref 13.0–17.0)
MCH: 28.8 pg (ref 26.0–34.0)
MCHC: 32 g/dL (ref 30.0–36.0)
MCV: 90.1 fL (ref 78.0–100.0)
Platelets: 209 10*3/uL (ref 150–400)
RBC: 4.44 MIL/uL (ref 4.22–5.81)
RDW: 14.4 % (ref 11.5–15.5)
WBC: 6.5 10*3/uL (ref 4.0–10.5)

## 2015-08-02 MED ORDER — LEVALBUTEROL HCL 0.63 MG/3ML IN NEBU
INHALATION_SOLUTION | RESPIRATORY_TRACT | Status: AC
  Administered 2015-08-02: 0.63 mg
  Filled 2015-08-02: qty 3

## 2015-08-02 MED ORDER — PREDNISONE 10 MG PO TABS: 40.0000 mg | ORAL_TABLET | Freq: Every day | ORAL | Status: DC

## 2015-08-02 MED ORDER — PREDNISONE 50 MG PO TABS
60.0000 mg | ORAL_TABLET | Freq: Once | ORAL | Status: AC
  Administered 2015-08-02: 60 mg via ORAL
  Filled 2015-08-02 (×2): qty 1

## 2015-08-02 MED ORDER — ALBUTEROL SULFATE HFA 108 (90 BASE) MCG/ACT IN AERS
2.0000 | INHALATION_SPRAY | Freq: Four times a day (QID) | RESPIRATORY_TRACT | Status: DC
  Administered 2015-08-02: 2 via RESPIRATORY_TRACT
  Filled 2015-08-02: qty 6.7

## 2015-08-02 MED ORDER — LEVALBUTEROL HCL 0.63 MG/3ML IN NEBU
0.6300 mg | INHALATION_SOLUTION | Freq: Once | RESPIRATORY_TRACT | Status: DC
  Filled 2015-08-02: qty 3

## 2015-08-02 MED ORDER — IPRATROPIUM-ALBUTEROL 0.5-2.5 (3) MG/3ML IN SOLN
RESPIRATORY_TRACT | Status: AC
  Filled 2015-08-02: qty 3

## 2015-08-02 MED ORDER — IPRATROPIUM BROMIDE 0.02 % IN SOLN
RESPIRATORY_TRACT | Status: AC
  Filled 2015-08-02: qty 2.5

## 2015-08-02 MED ORDER — IPRATROPIUM BROMIDE 0.02 % IN SOLN
0.5000 mg | Freq: Once | RESPIRATORY_TRACT | Status: AC
  Administered 2015-08-02: 0.5 mg via RESPIRATORY_TRACT

## 2015-08-02 NOTE — ED Notes (Signed)
Home 02 tank empty. Pt states he only uses it PRN and will be OK until he gets home. States wife on way to get him and only takes 10 min to get home. Pt in room on room oxygen 2l [place] until ride gets here and will take out to car in w/c

## 2015-08-02 NOTE — ED Notes (Signed)
See triage notes. Color wnl.

## 2015-08-02 NOTE — ED Notes (Signed)
pts 02 was not empty, pt used it on way home.

## 2015-08-02 NOTE — ED Notes (Signed)
Patient complaining of shortness of breath "for a couple of days." Patient has audible wheezing and use of accessory muscles. Patient arrived on home O2 2L. Patient also complaining of bilateral side pain.

## 2015-08-02 NOTE — Discharge Instructions (Signed)
Use your albuterol inhaler 2 puffs every 6 hours for at least the next 7 days.  Take the prednisone for the next 5 days.  Continue to use her oxygen at home.  Make an appointment with your regular Dr. For follow-up.  Return for any new or worse symptoms.

## 2015-08-02 NOTE — ED Provider Notes (Signed)
CSN: [id number]     Arrival date & time 08/02/15  1919 History   First MD Initiated Contact with Patient 08/02/15 1922     Chief Complaint  Patient presents with  . Respiratory Distress     (Consider location/radiation/quality/duration/timing/severity/associated sxs/prior Treatment) The history is provided by the patient and the EMS personnel.  64 year old male with known history of COPD. Normally takes 10 mg of prednisone a day. Patient has been feeling short of breath for the past couple of days. he arrived with audible wheezing and use of accessory muscles. Patient is normally on 2 L of oxygen uses at most the time. Patient has some bilateral chest wall discomfort which is described as a's soreness. Patient's had a little bit of productive cough. No fevers.  Past Medical History  Diagnosis Date  . GERD (gastroesophageal reflux disease)   . COPD (chronic obstructive pulmonary disease)     severe by PFTs August 2010  . 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 Atrial fibrillation     post-op a-fib. 3/11. post Aortobifem , /  Atrial fibrillation from nebulizer treatment  May, 2012, while hospitalized  . Renal artery stenosis      presumably corrected at time of aortobifem surgery March, 2011  . Tobacco user      stop smoking  . Pneumothorax      2011  before aortobifemoral surgery,  resolved  . Peripheral vascular disease, unspecified     bilateral intermittent claudication  . CAD (coronary artery disease), native coronary artery     catheterization 06/2009.Marland KitchenMarland Kitchen70% proximal LAD and 30% proximal RCA. normal LV function, no intervention planned prior to surgery for aorta. LV normal function  by catheter 8/10.   [name] Dyslipidemia   . DJD (degenerative joint disease)   . Ejection fraction     60%, echo, May, 2012, rapid atrial fib at the time  . Cancer 2013    Prostate Radiation  . Myocardial infarction  2011   Past Surgical History  Procedure Laterality Date  . Abdominal aortic aneurysm repair  01/11/2010  . Pr vein bypass graft,aorto-fem-pop    . Inguinal hernia repair      RIGHT   Family History  Problem Relation Age of Onset  . Cancer Father     prostate  . Heart attack Father    Social History  Substance Use Topics  . Smoking status: Former Smoker -- 2.00 packs/day for 40 years    Types: Cigarettes    Quit date: 01/11/2010  . Smokeless tobacco: Never Used  . Alcohol Use: No    Review of Systems  Constitutional: Negative for fever.  HENT: Negative for congestion.   Eyes: Negative for visual disturbance.  Respiratory: Positive for cough and shortness of breath.   Cardiovascular: Positive for chest pain.  Gastrointestinal: Negative for abdominal pain.  Genitourinary: Negative for dysuria.  Musculoskeletal: Negative for back pain.  Skin: Negative for rash.  Neurological: Negative for headaches.  Hematological: Does not bruise/bleed easily.  Psychiatric/Behavioral: Negative for confusion.      Allergies  Review of patient's allergies indicates no known allergies.  Home Medications   Prior to Admission medications   Medication Sig Start Date End Date Taking? Authorizing Provider  albuterol (PROVENTIL) (2.5 MG/3ML) 0.083% nebulizer solution Take 3 mLs (2.5 mg total) by nebulization every 4 (four) hours as needed for wheezing or shortness  of breath. 03/15/15  Yes [name], MD  apixaban (ELIQUIS) 5 MG TABS tablet Take 1 tablet (5 mg total) by mouth 2 (two) times daily. 06/20/15  Yes [name], MD  gabapentin (NEURONTIN) 300 MG capsule Take 600 mg by mouth 3 (three) times daily.  02/13/14  Yes Historical Provider, MD  HYDROcodone-acetaminophen (NORCO/VICODIN) 5-325 MG per tablet Take 1 tablet by mouth every 6 (six) hours as needed for moderate pain.   Yes Historical Provider, MD  ipratropium (ATROVENT) 0.02 % nebulizer solution Take 0.5 mg by nebulization every 4  (four) hours as needed for wheezing or shortness of breath.   Yes Historical Provider, MD  lidocaine (XYLOCAINE) 5 % ointment Apply 1 application topically daily as needed. 07/23/15  Yes Historical Provider, MD  metoprolol (LOPRESSOR) 50 MG tablet Take 50 mg by mouth 2 (two) times daily. 12/27/13  Yes Historical Provider, MD  nitroGLYCERIN (NITROSTAT) 0.4 MG SL tablet Place 1 tablet (0.4 mg total) under the tongue every 5 (five) minutes x 3 doses as needed for chest pain. 06/11/15  Yes [name], MD  omeprazole (PRILOSEC) 20 MG capsule Take 1 capsule by mouth every morning.  04/21/11  Yes Historical Provider, MD  predniSONE (DELTASONE) 10 MG tablet Take 60mg  po daily for 2 days then 40mg  po daily for 2 days then 30mg  po daily for 2 days then 20mg  po daily for 2 days then 10mg  po daily Patient taking differently: Take 10 mg by mouth daily.  07/05/15  Yes [name], MD  simvastatin (ZOCOR) 40 MG tablet TAKE 1 TABLET (40 MG TOTAL) BY MOUTH EVERY EVENING. 07/26/15  Yes [name], MD  albuterol (PROVENTIL HFA;VENTOLIN HFA) 108 (90 BASE) MCG/ACT inhaler Inhale 2 puffs into the lungs every 4 (four) hours as needed for wheezing or shortness of breath. 01/17/15   Kristen N Ward, DO  azithromycin (ZITHROMAX) 500 MG tablet Take 1 tablet (500 mg total) by mouth daily. Patient not taking: Reported on 08/02/2015 07/05/15   [name], MD  guaiFENesin (MUCINEX) 600 MG 12 hr tablet Take 1 tablet (600 mg total) by mouth 2 (two) times daily. Patient not taking: Reported on 08/02/2015 07/05/15   [name], MD  predniSONE (DELTASONE) 10 MG tablet Take 4 tablets (40 mg total) by mouth daily. 08/02/15   Fredia Sorrow, MD   BP 147/78 mmHg  Pulse 100  Temp(Src) 98.6 F (37 C) (Oral)  Resp 23  Ht 5\' 4"  (1.626 m)  Wt 146 lb (66.225 kg)  BMI 25.05 kg/m2  SpO2 100% Physical Exam  Constitutional: He is oriented to person, place, and time. He appears well-developed and well-nourished. He appears distressed.   HENT:  Head: Normocephalic and atraumatic.  Mouth/Throat: Oropharynx is clear and moist.  Eyes: EOM are normal. Pupils are equal, round, and reactive to light.  Neck: Normal range of motion. Neck supple.  Cardiovascular: Normal rate, regular rhythm and normal heart sounds.   No murmur heard. Pulmonary/Chest: He is in respiratory distress. He has wheezes.  Abdominal: Bowel sounds are normal. There is no tenderness.  Musculoskeletal: Normal range of motion. He exhibits no edema.  Neurological: He is alert and oriented to person, place, and time. No cranial nerve deficit. He exhibits normal muscle tone. Coordination normal.  Skin: Skin is warm. No rash noted.  Nursing note and vitals reviewed.   ED Course  Procedures (including critical care time) Labs Review Labs Reviewed  CBC - Abnormal; Notable for the following:  Hemoglobin 12.8 (*)    All other components within normal limits  BASIC METABOLIC PANEL - Abnormal; Notable for the following:    Glucose, Bld 119 (*)    Calcium 8.5 (*)    All other components within normal limits  BRAIN NATRIURETIC PEPTIDE   Results for orders placed or performed during the hospital encounter of 08/02/15  CBC  Result Value Ref Range   WBC 6.5 4.0 - 10.5 K/uL   RBC 4.44 4.22 - 5.81 MIL/uL   Hemoglobin 12.8 (L) 13.0 - 17.0 g/dL   HCT 40.0 39.0 - 52.0 %   MCV 90.1 78.0 - 100.0 fL   MCH 28.8 26.0 - 34.0 pg   MCHC 32.0 30.0 - 36.0 g/dL   RDW 14.4 11.5 - 15.5 %   Platelets 209 150 - 400 K/uL  Basic metabolic panel  Result Value Ref Range   Sodium 141 135 - 145 mmol/L   Potassium 3.8 3.5 - 5.1 mmol/L   Chloride 110 101 - 111 mmol/L   CO2 25 22 - 32 mmol/L   Glucose, Bld 119 (H) 65 - 99 mg/dL   BUN 9 6 - 20 mg/dL   Creatinine, Ser 0.63 0.61 - 1.24 mg/dL   Calcium 8.5 (L) 8.9 - 10.3 mg/dL   GFR calc non Af Amer >60 >60 mL/min   GFR calc Af Amer >60 >60 mL/min   Anion gap 6 5 - 15  Brain natriuretic peptide (only with dyspnea)  Result Value  Ref Range   B Natriuretic Peptide 42.0 0.0 - 100.0 pg/mL     Imaging Review Dg Chest 2 View  08/02/2015   CLINICAL DATA:  Sob, wheezing, productive cough, bilateral flank pain x 2 days. History of AAA, COPD, A-FIB, CAD, MI, prostate cancer, bypass surgery, pneumothorax.  EXAM: CHEST  2 VIEW  COMPARISON:  07/03/2015  FINDINGS: Cardiac silhouette is normal in size and configuration. Normal mediastinal and hilar contours.  Lungs are hyperexpanded. There is stable upper lobe and anterior lung base scarring. No lung consolidation or edema. No mass or suspicious nodule. No pleural effusion or pneumothorax.  Bony thorax is demineralized. Mild compression deformity noted of a mid thoracic vertebrae.  IMPRESSION: 1. No acute cardiopulmonary disease. 2. COPD.   Electronically Signed   By: Lajean Manes M.D.   On: 08/02/2015 20:05   I have personally reviewed and evaluated these images and lab results as part of my medical decision-making.   EKG Interpretation   Date/Time:  Thursday August 02 2015 19:27:49 EDT Ventricular Rate:  107 PR Interval:  128 QRS Duration: 82 QT Interval:  356 QTC Calculation: 475 R Axis:   49 Text Interpretation:  Sinus tachycardia RSR' in V1 or V2, right VCD or RVH  Baseline wander in lead(s) V6 Confirmed by ZACKOWSKI  MD, SCOTT [phone]) on  08/02/2015 7:53:42 PM      MDM   Final diagnoses:  COPD with exacerbation   Patient with known history of COPD. Chest x-ray negative for pneumonia. Consistent with COPD. This is most likely exacerbation of his COPD. Patient's on baseline prednisone 10 mg a day. Increased here today patient given 60 mg of prednisone and we continued on 40 mg of prednisone for the next 5 days. In addition patient had is a beautiful overall inhaler renewed. And patient will continue use home oxygen. Patient will make an appointment to follow his record Dr.  Following albuterol Atrovent treatment here in the prednisone patient without any wheezing  oxygen saturations  on his normal 2 L is 100%. Patient nontoxic no acute distress.    Fredia Sorrow, MD 08/02/15 2158

## 2015-08-08 ENCOUNTER — Ambulatory Visit (HOSPITAL_COMMUNITY): Payer: Medicare Other | Attending: Pulmonary Disease

## 2015-08-28 ENCOUNTER — Other Ambulatory Visit: Payer: Self-pay | Admitting: Cardiology

## 2015-09-17 ENCOUNTER — Encounter (HOSPITAL_COMMUNITY): Payer: Self-pay | Admitting: Emergency Medicine

## 2015-09-17 ENCOUNTER — Inpatient Hospital Stay (HOSPITAL_COMMUNITY)
Admission: EM | Admit: 2015-09-17 | Discharge: 2015-09-19 | DRG: 192 | Disposition: A | Discharge status: Home / Self Care | Payer: Medicare Other | Attending: Internal Medicine | Admitting: Internal Medicine

## 2015-09-17 ENCOUNTER — Emergency Department (HOSPITAL_COMMUNITY): Payer: Medicare Other

## 2015-09-17 DIAGNOSIS — E785 Hyperlipidemia, unspecified: Secondary | ICD-10-CM | POA: Diagnosis present

## 2015-09-17 DIAGNOSIS — Z8249 Family history of ischemic heart disease and other diseases of the circulatory system: Secondary | ICD-10-CM

## 2015-09-17 DIAGNOSIS — I10 Essential (primary) hypertension: Secondary | ICD-10-CM | POA: Diagnosis present

## 2015-09-17 DIAGNOSIS — Z8042 Family history of malignant neoplasm of prostate: Secondary | ICD-10-CM

## 2015-09-17 DIAGNOSIS — K219 Gastro-esophageal reflux disease without esophagitis: Secondary | ICD-10-CM | POA: Diagnosis present

## 2015-09-17 DIAGNOSIS — Z87891 Personal history of nicotine dependence: Secondary | ICD-10-CM

## 2015-09-17 DIAGNOSIS — Z7952 Long term (current) use of systemic steroids: Secondary | ICD-10-CM

## 2015-09-17 DIAGNOSIS — J441 Chronic obstructive pulmonary disease with (acute) exacerbation: Secondary | ICD-10-CM | POA: Diagnosis not present

## 2015-09-17 DIAGNOSIS — J9610 Chronic respiratory failure, unspecified whether with hypoxia or hypercapnia: Secondary | ICD-10-CM | POA: Diagnosis present

## 2015-09-17 DIAGNOSIS — I480 Paroxysmal atrial fibrillation: Secondary | ICD-10-CM | POA: Diagnosis present

## 2015-09-17 LAB — CBC WITH DIFFERENTIAL/PLATELET
BASOS ABS: 0 10*3/uL (ref 0.0–0.1)
Basophils Relative: 0 %
Eosinophils Absolute: 0.1 10*3/uL (ref 0.0–0.7)
Eosinophils Relative: 1 %
HCT: 39.6 % (ref 39.0–52.0)
HEMOGLOBIN: 12.7 g/dL — AB (ref 13.0–17.0)
LYMPHS ABS: 2.4 10*3/uL (ref 0.7–4.0)
LYMPHS PCT: 26 %
MCH: 28.9 pg (ref 26.0–34.0)
MCHC: 32.1 g/dL (ref 30.0–36.0)
MCV: 90 fL (ref 78.0–100.0)
Monocytes Absolute: 1.5 10*3/uL — ABNORMAL HIGH (ref 0.1–1.0)
Monocytes Relative: 17 %
NEUTROS ABS: 5.1 10*3/uL (ref 1.7–7.7)
NEUTROS PCT: 56 %
Platelets: 249 10*3/uL (ref 150–400)
RBC: 4.4 MIL/uL (ref 4.22–5.81)
RDW: 15.2 % (ref 11.5–15.5)
WBC: 9.1 10*3/uL (ref 4.0–10.5)

## 2015-09-17 LAB — BLOOD GAS, ARTERIAL
Acid-Base Excess: 1.8 mmol/L (ref 0.0–2.0)
BICARBONATE: 25.7 meq/L — AB (ref 20.0–24.0)
DRAWN BY: 22223
O2 CONTENT: 5 L/min
O2 SAT: 99.1 %
PH ART: 7.385 (ref 7.350–7.450)
Patient temperature: 37
TCO2: 17.8 mmol/L (ref 0–100)
pCO2 arterial: 44.9 mmHg (ref 35.0–45.0)
pO2, Arterial: 180 mmHg — ABNORMAL HIGH (ref 80.0–100.0)

## 2015-09-17 MED ORDER — ALBUTEROL (5 MG/ML) CONTINUOUS INHALATION SOLN: 10.0000 mg/h | INHALATION_SOLUTION | Freq: Once | RESPIRATORY_TRACT | Status: DC

## 2015-09-17 MED ORDER — METHYLPREDNISOLONE SODIUM SUCC 125 MG IJ SOLR
INTRAMUSCULAR | Status: AC
  Filled 2015-09-17: qty 2

## 2015-09-17 MED ORDER — METHYLPREDNISOLONE SODIUM SUCC 125 MG IJ SOLR
125.0000 mg | Freq: Once | INTRAMUSCULAR | Status: AC
  Administered 2015-09-17: 125 mg via INTRAVENOUS

## 2015-09-17 MED ORDER — IPRATROPIUM BROMIDE 0.02 % IN SOLN
RESPIRATORY_TRACT | Status: AC
  Administered 2015-09-17: 0.5 mg
  Filled 2015-09-17: qty 2.5

## 2015-09-17 MED ORDER — IPRATROPIUM-ALBUTEROL 0.5-2.5 (3) MG/3ML IN SOLN: 3.0000 mL | Freq: Once | RESPIRATORY_TRACT | Status: DC

## 2015-09-17 MED ORDER — ALBUTEROL (5 MG/ML) CONTINUOUS INHALATION SOLN
INHALATION_SOLUTION | RESPIRATORY_TRACT | Status: AC
  Administered 2015-09-17: 23:00:00
  Filled 2015-09-17: qty 20

## 2015-09-17 NOTE — ED Notes (Signed)
Patient complaining of shortness of breath starting suddenly tonight. Patient states he has been coughing up thick yellow sputum "for a couple of days." Patient has audible wheezing at triage.

## 2015-09-17 NOTE — ED Provider Notes (Signed)
By signing my name below, I, Kenneth Merritt, attest that this documentation has been prepared under the direction and in the presence of [place], DO. Electronically Signed: Judithann Merritt, ED Scribe. 09/17/2015. 11:24 PM.   TIME SEEN: 11:18 PM   CHIEF COMPLAINT: SOB and Wheezing  HPI: HPI Comments: Kenneth Merritt is a 64 y.o. male with a hx of COPD on intermittent home O2, A-fib on Eliquis, PVD, CAD, and prostate cancer who presents to the Emergency Department complaining of gradually worsening SOB and wheezing onset several days ago. He explains that his symptoms became suddenly worse tonight. He reports associated productive cough with thick yellow sputum and chest soreness due to the cough. Pt denies any fever. No alleviating factors noted PTA. Denies history of PE or DVT.  He denies a hx of CHF. He reports that he is former smoker.   PCP: Dr. Sherrie Merritt.    ROS: See HPI Constitutional: no fever  Eyes: no drainage  ENT: no runny nose   Cardiovascular:   chest pain  Resp: SOB  GI: no vomiting GU: no dysuria Integumentary: no rash  Allergy: no hives  Musculoskeletal: no leg swelling  Neurological: no slurred speech ROS otherwise negative  PAST MEDICAL HISTORY/PAST SURGICAL HISTORY:  Past Medical History  Diagnosis Date  . GERD (gastroesophageal reflux disease)   . COPD (chronic obstructive pulmonary disease) ([place])     severe by PFTs August 2010  . 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 Atrial fibrillation ([facility])     post-op a-fib. 3/11. post Aortobifem , /  Atrial fibrillation from nebulizer treatment  May, 2012, while hospitalized  . Renal artery stenosis ([place])      presumably corrected at time of aortobifem surgery March, 2011  . Tobacco user      stop smoking  . Pneumothorax      2011  before aortobifemoral surgery,  resolved  . Peripheral vascular disease, unspecified  ([place])     bilateral intermittent claudication  . CAD (coronary artery disease), native coronary artery     catheterization 06/2009.Kenneth KitchenMarland Kitchen70% proximal LAD and 30% proximal RCA. normal LV function, no intervention planned prior to surgery for aorta. LV normal function  by catheter 8/10.   Kenneth Merritt Dyslipidemia   . DJD (degenerative joint disease)   . Ejection fraction     60%, echo, May, 2012, rapid atrial fib at the time  . Cancer Kenneth Merritt) 2013    Prostate Radiation  . Myocardial infarction Kenneth Merritt) 2011    MEDICATIONS:  Prior to Admission medications   Medication Sig Start Date End Date Taking? Authorizing Provider  albuterol (PROVENTIL HFA;VENTOLIN HFA) 108 (90 BASE) MCG/ACT inhaler Inhale 2 puffs into the lungs every 4 (four) hours as needed for wheezing or shortness of breath. 01/17/15   Latifah Padin N Anastasija Anfinson, DO  albuterol (PROVENTIL) (2.5 MG/3ML) 0.083% nebulizer solution Take 3 mLs (2.5 mg total) by nebulization every 4 (four) hours as needed for wheezing or shortness of breath. 03/15/15   [name], MD  apixaban (ELIQUIS) 5 MG TABS tablet Take 1 tablet (5 mg total) by mouth 2 (two) times daily. 06/20/15   [name], MD  azithromycin (ZITHROMAX) 500 MG tablet Take 1 tablet (500 mg total) by mouth daily. Patient not taking: Reported on 08/02/2015 07/05/15   [name], MD  gabapentin (NEURONTIN) 300 MG capsule Take 600 mg by  mouth 3 (three) times daily.  02/13/14   Historical Provider, MD  guaiFENesin (MUCINEX) 600 MG 12 hr tablet Take 1 tablet (600 mg total) by mouth 2 (two) times daily. Patient not taking: Reported on 08/02/2015 07/05/15   [name], MD  HYDROcodone-acetaminophen (NORCO/VICODIN) 5-325 MG per tablet Take 1 tablet by mouth every 6 (six) hours as needed for moderate pain.    Historical Provider, MD  ipratropium (ATROVENT) 0.02 % nebulizer solution Take 0.5 mg by nebulization every 4 (four) hours as needed for wheezing or shortness of breath.    Historical Provider, MD  lidocaine  (XYLOCAINE) 5 % ointment Apply 1 application topically daily as needed. 07/23/15   Historical Provider, MD  metoprolol (LOPRESSOR) 50 MG tablet Take 50 mg by mouth 2 (two) times daily. 12/27/13   Historical Provider, MD  nitroGLYCERIN (NITROSTAT) 0.4 MG SL tablet Place 1 tablet (0.4 mg total) under the tongue every 5 (five) minutes x 3 doses as needed for chest pain. 06/11/15   [name], MD  omeprazole (PRILOSEC) 20 MG capsule Take 1 capsule by mouth every morning.  04/21/11   Historical Provider, MD  predniSONE (DELTASONE) 10 MG tablet Take 60mg  po daily for 2 days then 40mg  po daily for 2 days then 30mg  po daily for 2 days then 20mg  po daily for 2 days then 10mg  po daily Patient taking differently: Take 10 mg by mouth daily.  07/05/15   [name], MD  predniSONE (DELTASONE) 10 MG tablet Take 4 tablets (40 mg total) by mouth daily. 08/02/15   Fredia Sorrow, MD  simvastatin (ZOCOR) 40 MG tablet TAKE 1 TABLET (40 MG TOTAL) BY MOUTH EVERY EVENING. 08/29/15   [name], MD    ALLERGIES:  No Known Allergies  SOCIAL HISTORY:  Social History  Substance Use Topics  . Smoking status: Former Smoker -- 2.00 packs/day for 40 years    Types: Cigarettes    Quit date: 01/11/2010  . Smokeless tobacco: Never Used  . Alcohol Use: No    FAMILY HISTORY: Family History  Problem Relation Age of Onset  . Cancer Father     prostate  . Heart attack Father     EXAM: BP 162/125 mmHg  Pulse 89  Temp(Src) 97.7 F (36.5 C) (Oral)  Resp 36  Ht 5\' 4"  (1.626 m)  Wt 160 lb (72.576 kg)  BMI 27.45 kg/m2  SpO2 100% CONSTITUTIONAL: Alert and oriented and responds appropriately to questions. Chronically ill-appearing, in moderate respiratory distress HEAD: Normocephalic EYES: Conjunctivae clear, PERRL ENT: normal nose; no rhinorrhea; moist mucous membranes; pharynx without lesions noted NECK: Supple, no meningismus, no LAD  CARD: RRR; S1 and S2 appreciated; no murmurs, no clicks, no rubs, no  gallops RESP: Normal chest excursion without splinting, patient is tachypneic, significantly diminished breath sounds diffusely with very minimal expiratory wheezing, no rhonchi or rales, speaking short sentences, moderate respiratory distress, no hypoxia on 3 L nasal cannula ABD/GI: Normal bowel sounds; non-distended; soft, non-tender, no rebound, no guarding, no peritoneal signs BACK:  The back appears normal and is non-tender to palpation, there is no CVA tenderness EXT: Normal ROM in all joints; non-tender to palpation; no edema; normal capillary refill; no cyanosis, no calf tenderness or swelling    SKIN: Normal color for age and race; warm NEURO: Moves all extremities equally, sensation to light touch intact diffusely, cranial nerves II through XII intact PSYCH: The patient's mood and manner are appropriate. Grooming and personal hygiene are appropriate.  MEDICAL  DECISION MAKING: Patient here with likely COPD exacerbation versus pneumonia. No signs of volume overload on exam. Does have chest pain but describes as musculoskeletal from coughing. Patient is receiving continuous albuterol and IV Solu-Medrol. We'll obtain labs, chest x-ray. Anticipate patient will likely need admission.  ED PROGRESS: 12:30 AM  Pt's labs unremarkable. ABG shows pH is 7.385, PCO2 44.9, bicarbonate 25.7. PO2 180 on 3 L nasal cannula. After continuous albuterol, patient has better aeration but still having extra wheezing, prolonged expiratory time, tachypnea. Now able to speak longer sentences and appears more comfortable and states he feels better. I feel patient will need regular DuoNeb treatments. We'll start this in the emergency department but I will admit him. PCP is Dr. Romona Curls.   12:50 AM  D/w Dr. Waldron Labs for admission to tele bed.  He will see pt and place orders.  Pt comfortable with this plan.   EKG Interpretation  Date/Time:  Monday September 17 2015 23:06:40 EST Ventricular Rate:  86 PR  Interval:  162 QRS Duration: 82 QT Interval:  498 QTC Calculation: 596 R Axis:   53 Text Interpretation:  Sinus rhythm RSR' in V1 or V2, probably normal variant Prolonged QT interval Baseline wander in lead(s) V4 V5 Baseline wander Confirmed by Sheala Dosh,  DO, Burrel Legrand [phone]) on 09/17/2015 11:16:59 PM         EKG Interpretation  Date/Time:  Tuesday September 18 2015 00:31:00 EST Ventricular Rate:  92 PR Interval:  121 QRS Duration: 86 QT Interval:  363 QTC Calculation: 449 R Axis:   65 Text Interpretation:  Sinus rhythm Confirmed by Shamikia Linskey,  DO, Breezie Micucci ([id number]) on 09/18/2015 12:42:21 AM        [place], DO 09/18/15 0312

## 2015-09-18 DIAGNOSIS — E785 Hyperlipidemia, unspecified: Secondary | ICD-10-CM

## 2015-09-18 DIAGNOSIS — J9610 Chronic respiratory failure, unspecified whether with hypoxia or hypercapnia: Secondary | ICD-10-CM | POA: Diagnosis present

## 2015-09-18 DIAGNOSIS — K219 Gastro-esophageal reflux disease without esophagitis: Secondary | ICD-10-CM

## 2015-09-18 DIAGNOSIS — Z7952 Long term (current) use of systemic steroids: Secondary | ICD-10-CM | POA: Diagnosis not present

## 2015-09-18 DIAGNOSIS — J441 Chronic obstructive pulmonary disease with (acute) exacerbation: Secondary | ICD-10-CM | POA: Diagnosis present

## 2015-09-18 DIAGNOSIS — Z8249 Family history of ischemic heart disease and other diseases of the circulatory system: Secondary | ICD-10-CM | POA: Diagnosis not present

## 2015-09-18 DIAGNOSIS — I480 Paroxysmal atrial fibrillation: Secondary | ICD-10-CM

## 2015-09-18 DIAGNOSIS — Z87891 Personal history of nicotine dependence: Secondary | ICD-10-CM | POA: Diagnosis not present

## 2015-09-18 DIAGNOSIS — I10 Essential (primary) hypertension: Secondary | ICD-10-CM | POA: Diagnosis present

## 2015-09-18 DIAGNOSIS — Z7901 Long term (current) use of anticoagulants: Secondary | ICD-10-CM | POA: Diagnosis not present

## 2015-09-18 DIAGNOSIS — Z8042 Family history of malignant neoplasm of prostate: Secondary | ICD-10-CM | POA: Diagnosis not present

## 2015-09-18 LAB — CBC
HEMATOCRIT: 39.2 % (ref 39.0–52.0)
HEMOGLOBIN: 12.4 g/dL — AB (ref 13.0–17.0)
MCH: 28.6 pg (ref 26.0–34.0)
MCHC: 31.6 g/dL (ref 30.0–36.0)
MCV: 90.3 fL (ref 78.0–100.0)
Platelets: 257 10*3/uL (ref 150–400)
RBC: 4.34 MIL/uL (ref 4.22–5.81)
RDW: 15.1 % (ref 11.5–15.5)
WBC: 8.7 10*3/uL (ref 4.0–10.5)

## 2015-09-18 LAB — BASIC METABOLIC PANEL
ANION GAP: 10 (ref 5–15)
ANION GAP: 9 (ref 5–15)
BUN: 8 mg/dL (ref 6–20)
BUN: 8 mg/dL (ref 6–20)
CALCIUM: 8.8 mg/dL — AB (ref 8.9–10.3)
CHLORIDE: 107 mmol/L (ref 101–111)
CO2: 25 mmol/L (ref 22–32)
CO2: 26 mmol/L (ref 22–32)
CREATININE: 0.68 mg/dL (ref 0.61–1.24)
Calcium: 9 mg/dL (ref 8.9–10.3)
Chloride: 108 mmol/L (ref 101–111)
Creatinine, Ser: 0.56 mg/dL — ABNORMAL LOW (ref 0.61–1.24)
GFR calc Af Amer: >60 mL/min (ref >60)
GFR calc non Af Amer: >60 mL/min (ref >60)
GFR calc non Af Amer: >60 mL/min (ref >60)
GLUCOSE: 99 mg/dL (ref 65–99)
Glucose, Bld: 174 mg/dL — ABNORMAL HIGH (ref 65–99)
POTASSIUM: 4 mmol/L (ref 3.5–5.1)
Potassium: 4.2 mmol/L (ref 3.5–5.1)
SODIUM: 142 mmol/L (ref 135–145)
Sodium: 143 mmol/L (ref 135–145)

## 2015-09-18 LAB — TROPONIN I: Troponin I: <0.03 ng/mL (ref <0.031)

## 2015-09-18 MED ORDER — HYDROCODONE-ACETAMINOPHEN 5-325 MG PO TABS: 1.0000 | ORAL_TABLET | Freq: Four times a day (QID) | ORAL | Status: DC | PRN

## 2015-09-18 MED ORDER — ONDANSETRON HCL 4 MG PO TABS: 4.0000 mg | ORAL_TABLET | Freq: Four times a day (QID) | ORAL | Status: DC | PRN

## 2015-09-18 MED ORDER — LEVOFLOXACIN IN D5W 750 MG/150ML IV SOLN
750.0000 mg | Freq: Once | INTRAVENOUS | Status: AC
  Administered 2015-09-18: 750 mg via INTRAVENOUS
  Filled 2015-09-18: qty 150

## 2015-09-18 MED ORDER — APIXABAN 5 MG PO TABS
5.0000 mg | ORAL_TABLET | Freq: Two times a day (BID) | ORAL | Status: DC
  Administered 2015-09-18 – 2015-09-19 (×3): 5 mg via ORAL
  Filled 2015-09-18 (×4): qty 1

## 2015-09-18 MED ORDER — ONDANSETRON HCL 4 MG/2ML IJ SOLN: 4.0000 mg | Freq: Four times a day (QID) | INTRAMUSCULAR | Status: DC | PRN

## 2015-09-18 MED ORDER — GUAIFENESIN ER 600 MG PO TB12
600.0000 mg | ORAL_TABLET | Freq: Two times a day (BID) | ORAL | Status: DC
  Administered 2015-09-18 (×2): 600 mg via ORAL
  Filled 2015-09-18 (×4): qty 1

## 2015-09-18 MED ORDER — ALBUTEROL SULFATE (2.5 MG/3ML) 0.083% IN NEBU: 2.5000 mg | INHALATION_SOLUTION | Freq: Four times a day (QID) | RESPIRATORY_TRACT | Status: DC

## 2015-09-18 MED ORDER — NITROGLYCERIN 0.4 MG SL SUBL: 0.4000 mg | SUBLINGUAL_TABLET | SUBLINGUAL | Status: DC | PRN

## 2015-09-18 MED ORDER — IPRATROPIUM-ALBUTEROL 0.5-2.5 (3) MG/3ML IN SOLN
3.0000 mL | Freq: Four times a day (QID) | RESPIRATORY_TRACT | Status: DC
  Administered 2015-09-18: 3 mL via RESPIRATORY_TRACT

## 2015-09-18 MED ORDER — METOPROLOL TARTRATE 50 MG PO TABS
50.0000 mg | ORAL_TABLET | Freq: Two times a day (BID) | ORAL | Status: DC
  Administered 2015-09-18 – 2015-09-19 (×3): 50 mg via ORAL
  Filled 2015-09-18 (×4): qty 1

## 2015-09-18 MED ORDER — GABAPENTIN 300 MG PO CAPS
600.0000 mg | ORAL_CAPSULE | Freq: Three times a day (TID) | ORAL | Status: DC
  Administered 2015-09-18 – 2015-09-19 (×4): 600 mg via ORAL
  Filled 2015-09-18 (×4): qty 2

## 2015-09-18 MED ORDER — ALBUTEROL SULFATE (2.5 MG/3ML) 0.083% IN NEBU
2.5000 mg | INHALATION_SOLUTION | RESPIRATORY_TRACT | Status: DC | PRN
  Administered 2015-09-18: 2.5 mg via RESPIRATORY_TRACT
  Filled 2015-09-18: qty 3

## 2015-09-18 MED ORDER — SODIUM CHLORIDE 0.9 % IJ SOLN
3.0000 mL | Freq: Two times a day (BID) | INTRAMUSCULAR | Status: DC
  Administered 2015-09-18 (×3): 3 mL via INTRAVENOUS

## 2015-09-18 MED ORDER — IPRATROPIUM-ALBUTEROL 0.5-2.5 (3) MG/3ML IN SOLN
3.0000 mL | Freq: Four times a day (QID) | RESPIRATORY_TRACT | Status: DC
  Administered 2015-09-18 – 2015-09-19 (×5): 3 mL via RESPIRATORY_TRACT
  Filled 2015-09-18 (×5): qty 3

## 2015-09-18 MED ORDER — SIMVASTATIN 20 MG PO TABS
40.0000 mg | ORAL_TABLET | Freq: Every day | ORAL | Status: DC
  Administered 2015-09-18: 40 mg via ORAL
  Filled 2015-09-18: qty 2

## 2015-09-18 MED ORDER — IPRATROPIUM-ALBUTEROL 0.5-2.5 (3) MG/3ML IN SOLN
3.0000 mL | RESPIRATORY_TRACT | Status: DC
  Filled 2015-09-18: qty 6

## 2015-09-18 MED ORDER — SODIUM CHLORIDE 0.9 % IV SOLN
INTRAVENOUS | Status: DC
  Administered 2015-09-18: 02:00:00 via INTRAVENOUS
  Administered 2015-09-18: 50 mL/h via INTRAVENOUS

## 2015-09-18 MED ORDER — PANTOPRAZOLE SODIUM 40 MG PO TBEC
40.0000 mg | DELAYED_RELEASE_TABLET | Freq: Every day | ORAL | Status: DC
  Administered 2015-09-18 – 2015-09-19 (×2): 40 mg via ORAL
  Filled 2015-09-18 (×2): qty 1

## 2015-09-18 MED ORDER — CETYLPYRIDINIUM CHLORIDE 0.05 % MT LIQD
7.0000 mL | Freq: Two times a day (BID) | OROMUCOSAL | Status: DC
  Administered 2015-09-18 (×2): 7 mL via OROMUCOSAL

## 2015-09-18 MED ORDER — METHYLPREDNISOLONE SODIUM SUCC 125 MG IJ SOLR
60.0000 mg | Freq: Three times a day (TID) | INTRAMUSCULAR | Status: DC
  Administered 2015-09-18: 60 mg via INTRAVENOUS
  Administered 2015-09-18: 17:00:00 via INTRAVENOUS
  Administered 2015-09-18 – 2015-09-19 (×2): 60 mg via INTRAVENOUS
  Filled 2015-09-18 (×4): qty 2

## 2015-09-18 MED ORDER — IPRATROPIUM BROMIDE 0.02 % IN SOLN
0.5000 mg | Freq: Four times a day (QID) | RESPIRATORY_TRACT | Status: DC
Start: 2015-09-18 — End: 2015-09-18

## 2015-09-18 MED ORDER — LEVOFLOXACIN IN D5W 750 MG/150ML IV SOLN: 750.0000 mg | INTRAVENOUS | Status: DC

## 2015-09-18 NOTE — Progress Notes (Signed)
ANTIBIOTIC CONSULT NOTE-Initial Consult  Pharmacy Consult for levofloxacin Indication: COPD exacerbation  No Known Allergies  Patient Measurements: Height: 5\' 4"  (162.6 cm) Weight: 148 lb 9.6 oz (67.405 kg) IBW/kg (Calculated) : 59.2   Vital Signs: Temp: 97.7 F (36.5 C) (11/08 0545) Temp Source: Oral (11/08 0545) BP: 147/87 mmHg (11/08 0545) Pulse Rate: 99 (11/08 0545)  Labs:  Recent Labs  09/17/15 2330 09/18/15 0818  WBC 9.1 8.7  HGB 12.7* 12.4*  PLT 249 257  CREATININE 0.56* 0.68    Estimated Creatinine Clearance: 79.1 mL/min (by C-G formula based on Cr of 0.68).  No results for input(s): VANCOTROUGH, VANCOPEAK, VANCORANDOM, GENTTROUGH, GENTPEAK, GENTRANDOM, TOBRATROUGH, TOBRAPEAK, TOBRARND, AMIKACINPEAK, AMIKACINTROU, AMIKACIN in the last 72 hours.   Microbiology: No results found for this or any previous visit (from the past 720 hour(s)).  Medical History: Past Medical History  Diagnosis Date  . GERD (gastroesophageal reflux disease)   . COPD (chronic obstructive pulmonary disease) ([place])     severe by PFTs August 2010  . 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 Atrial fibrillation (H[facility])     post-op a-fib. 3/11. post Aortobifem , /  Atrial fibrillation from nebulizer treatment  May, 2012, while hospitalized  . Renal artery stenosis ([place])      presumably corrected at time of aortobifem surgery March, 2011  . Tobacco user      stop smoking  . Pneumothorax      2011  before aortobifemoral surgery,  resolved  . Peripheral vascular disease, unspecified ([place])     bilateral intermittent claudication  . CAD (coronary artery disease), native coronary artery     catheterization 06/2009.Marland KitchenMarland Kitchen70% proximal LAD and 30% proximal RCA. normal LV function, no intervention planned prior to surgery for aorta. LV normal function  by catheter 8/10.   [name] Dyslipidemia   . DJD (degenerative joint  disease)   . Ejection fraction     60%, echo, May, 2012, rapid atrial fib at the time  . Cancer [facility]) 2013    Prostate Radiation  . Myocardial infarction [facility]) 2011    Medications:  Scheduled:  . antiseptic oral rinse  7 mL Mouth Rinse BID  . apixaban  5 mg Oral BID  . gabapentin  600 mg Oral TID  . guaiFENesin  600 mg Oral BID  . ipratropium-albuterol  3 mL Nebulization QID  . methylPREDNISolone (SOLU-MEDROL) injection  60 mg Intravenous 3 times per day  . metoprolol  50 mg Oral BID  . pantoprazole  40 mg Oral Daily  . simvastatin  40 mg Oral q1800  . sodium chloride  3 mL Intravenous Q12H    Assessment: 64 yo male being treated for COPD exacerbation. Home oxygen at 1.5 L chronic. Patient presents with worsening dyspnea, productive cough. Chest x-ray no evidence of PNA. Tmax 99, no leukocytosis. No blood cultures. Troponins ordered.  Goal of Therapy:  improvement of infection  Plan:  Levaquin 750mg  IV q24hrs Monitor v/s and labs  Isac Sarna, BS Pharm D, BCPS Clinical Pharmacist Pager [phone] 09/18/2015,9:16 AM

## 2015-09-18 NOTE — Progress Notes (Signed)
ANTIBIOTIC CONSULT NOTE-Preliminary  Pharmacy Consult for levofloxacin Indication: COPD exacerbation  No Known Allergies  Patient Measurements: Height: 5\' 4"  (162.6 cm) Weight: 148 lb 9.6 oz (67.405 kg) IBW/kg (Calculated) : 59.2   Vital Signs: Temp: 99 F (37.2 C) (11/08 0157) Temp Source: Oral (11/08 0157) BP: 144/90 mmHg (11/08 0157) Pulse Rate: 84 (11/08 0157)  Labs:  Recent Labs  09/17/15 2330  WBC 9.1  HGB 12.7*  PLT 249  CREATININE 0.56*    Estimated Creatinine Clearance: 79.1 mL/min (by C-G formula based on Cr of 0.56).  No results for input(s): VANCOTROUGH, VANCOPEAK, VANCORANDOM, GENTTROUGH, GENTPEAK, GENTRANDOM, TOBRATROUGH, TOBRAPEAK, TOBRARND, AMIKACINPEAK, AMIKACINTROU, AMIKACIN in the last 72 hours.   Microbiology: No results found for this or any previous visit (from the past 720 hour(s)).  Medical History: Past Medical History  Diagnosis Date  . GERD (gastroesophageal reflux disease)   . COPD (chronic obstructive pulmonary disease) ([place])     severe by PFTs August 2010  . 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 Atrial fibrillation (HCC)     post-op a-fib. 3/11. post Aortobifem , /  Atrial fibrillation from nebulizer treatment  May, 2012, while hospitalized  . Renal artery stenosis ([place])      presumably corrected at time of aortobifem surgery March, 2011  . Tobacco user      stop smoking  . Pneumothorax      2011  before aortobifemoral surgery,  resolved  . Peripheral vascular disease, unspecified ([place])     bilateral intermittent claudication  . CAD (coronary artery disease), native coronary artery     catheterization 06/2009.Marland KitchenMarland Kitchen70% proximal LAD and 30% proximal RCA. normal LV function, no intervention planned prior to surgery for aorta. LV normal function  by catheter 8/10.   [name] Dyslipidemia   . DJD (degenerative joint disease)   . Ejection fraction    60%, echo, May, 2012, rapid atrial fib at the time  . Cancer [facility]) 2013    Prostate Radiation  . Myocardial infarction [facility]) 2011    Medications:  Scheduled:  . antiseptic oral rinse  7 mL Mouth Rinse BID  . apixaban  5 mg Oral BID  . gabapentin  600 mg Oral TID  . guaiFENesin  600 mg Oral BID  . ipratropium-albuterol  3 mL Nebulization Q6H  . levofloxacin (LEVAQUIN) IV  750 mg Intravenous Once  . methylPREDNISolone (SOLU-MEDROL) injection  60 mg Intravenous 3 times per day  . metoprolol  50 mg Oral BID  . pantoprazole  40 mg Oral Daily  . simvastatin  40 mg Oral q1800  . sodium chloride  3 mL Intravenous Q12H    Assessment: 64 yo male being treated for COPD exacerbation  Goal of Therapy:  improvement of infection  Plan:  Preliminary review of pertinent patient information completed.  Protocol will be initiated with a one-time dose(s) of levofloxacin 750 mg IV.  Forestine Na clinical pharmacist will complete review during morning rounds to assess patient and finalize treatment regimen.  Nyra Capes, RPH 09/18/2015,2:29 AM

## 2015-09-18 NOTE — Care Management Note (Signed)
Case Management Note  Patient Details  Name: Kenneth Merritt MRN: [id number] Date of Birth: 06/02/1951  Subjective/Objective:                  Pt is from home, lives with wife and grandson and is ind with ADL's. Pt admitted with COPD exacerbation. Pt has home O2 with port tanks, pt does not require cont home O2 use but frequently needs it at night. Pt has neb machine, cane and walker if needed. Pt has no HH services prior to admission.   Action/Plan: Pt plans to return home with self care at DC. No CM needs anticipated.   Expected Discharge Date:    09/18/2015              Expected Discharge Plan:  Home/Self Care  In-House Referral:  NA  Discharge planning Services  CM Consult  Post Acute Care Choice:  NA Choice offered to:  NA  DME Arranged:    DME Agency:     HH Arranged:    HH Agency:     Status of Service:  Completed, signed off  Medicare Important Message Given:    Date Medicare IM Given:    Medicare IM give by:    Date Additional Medicare IM Given:    Additional Medicare Important Message give by:     If discussed at [place] of Stay Meetings, dates discussed:    Additional Comments:  Sherald Barge, RN 09/18/2015, 2:01 PM

## 2015-09-18 NOTE — Clinical Social Work Note (Signed)
CSW received consult for medication assistance. CM notified. CSW will sign off, but can be reconsulted if needed.  Alvin Diffee, LCSW [phone] 

## 2015-09-18 NOTE — Progress Notes (Signed)
Patient admitted by Dr. Waldron Labs earlier this morning  Patient seen and examined.  He has been admitted for COPD exacerbation. He has been started on steroids, abx and bronchodilators. He is starting to feel better and breathing is improving. He has diminished breath sounds bilaterally and no LE edema. Heart sounds are regular. Will continue current treatments. Anticipate discharge in the next 24 hours if continues to improve.  [name],Kenneth Merritt

## 2015-09-18 NOTE — H&P (Addendum)
Patient Demographics  Kenneth Merritt, is a 64 y.o. male  MRN: [id number]   DOB - [birth year]  Admit Date - 09/17/2015  Outpatient Primary MD for the patient is Neale Burly, MD   With History of -  Past Medical History  Diagnosis Date  . GERD (gastroesophageal reflux disease)   . COPD (chronic obstructive pulmonary disease) ([place])     severe by PFTs August 2010  . 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 Atrial fibrillation (HCC)     post-op a-fib. 3/11. post Aortobifem , /  Atrial fibrillation from nebulizer treatment  May, 2012, while hospitalized  . Renal artery stenosis ([place])      presumably corrected at time of aortobifem surgery March, 2011  . Tobacco user      stop smoking  . Pneumothorax      2011  before aortobifemoral surgery,  resolved  . Peripheral vascular disease, unspecified ([place])     bilateral intermittent claudication  . CAD (coronary artery disease), native coronary artery     catheterization 06/2009.Marland KitchenMarland Kitchen70% proximal LAD and 30% proximal RCA. normal LV function, no intervention planned prior to surgery for aorta. LV normal function  by catheter 8/10.   [name] Dyslipidemia   . DJD (degenerative joint disease)   . Ejection fraction     60%, echo, May, 2012, rapid atrial fib at the time  . Cancer [facility]) 2013    Prostate Radiation  . Myocardial infarction [facility]) 2011      Past Surgical History  Procedure Laterality Date  . Abdominal aortic aneurysm repair  01/11/2010  . Pr vein bypass graft,aorto-fem-pop    . Inguinal hernia repair      RIGHT    in for   Chief Complaint  Patient presents with  . Shortness of Breath  . Wheezing     HPI  Kenneth Merritt  is a 64 y.o. male, with history of COPD on home oxygen 1.5 L as needed and chronic steroids, A. fib on anticoagulation, GERD, PVD status post bypass, prostate cancer, CAD, patient presents to ER with complaints of few days  of worsening dyspnea, cough with productive dark yellow phlegm, patient was most recently admitted in August of this year for similar presentation where he was treated for COPD exacerbation, chest x-ray with no evidence of pneumonia, afebrile, no leukocytosis, patient received IV steroids, multivitamin plus treatment in ED, despite that remained with wheezing and dyspnea, as well patient reports chest soreness from coughing,  so hospitalist requested to admit the patient, as it reports he has been seen by pulmonary as an outpatient.    Review of Systems    In addition to the HPI above,  No Fever-chills, No Headache, No changes with Vision or hearing, No problems swallowing food or Liquids, Complains of nontypical Chest pain due to cough, complains of Cough and Shortness of Breath, No Abdominal pain, No Nausea or Vommitting, Bowel movements are regular, No Blood in stool or Urine, No dysuria, No new skin rashes or bruises, No new joints pains-aches,  No new weakness, tingling, numbness in any extremity, No recent weight gain or loss, No polyuria, polydypsia or polyphagia, No significant Mental Stressors.  A full 10 point Review of Systems was done, except as stated above, all other Review of Systems were negative.   Social History Social History  Substance Use Topics  .  Smoking status: Former Smoker -- 2.00 packs/day for 40 years    Types: Cigarettes    Quit date: 01/11/2010  . Smokeless tobacco: Never Used  . Alcohol Use: No     Family History Family History  Problem Relation Age of Onset  . Cancer Father     prostate  . Heart attack Father      Prior to Admission medications   Medication Sig Start Date End Date Taking? Authorizing Provider  albuterol (PROVENTIL HFA;VENTOLIN HFA) 108 (90 BASE) MCG/ACT inhaler Inhale 2 puffs into the lungs every 4 (four) hours as needed for wheezing or shortness of breath. 01/17/15   Kristen N Ward, DO  albuterol (PROVENTIL) (2.5 MG/3ML)  0.083% nebulizer solution Take 3 mLs (2.5 mg total) by nebulization every 4 (four) hours as needed for wheezing or shortness of breath. 03/15/15   [name], MD  apixaban (ELIQUIS) 5 MG TABS tablet Take 1 tablet (5 mg total) by mouth 2 (two) times daily. 06/20/15   [name], MD  azithromycin (ZITHROMAX) 500 MG tablet Take 1 tablet (500 mg total) by mouth daily. Patient not taking: Reported on 08/02/2015 07/05/15   [name], MD  gabapentin (NEURONTIN) 300 MG capsule Take 600 mg by mouth 3 (three) times daily.  02/13/14   Historical Provider, MD  guaiFENesin (MUCINEX) 600 MG 12 hr tablet Take 1 tablet (600 mg total) by mouth 2 (two) times daily. Patient not taking: Reported on 08/02/2015 07/05/15   [name], MD  HYDROcodone-acetaminophen (NORCO/VICODIN) 5-325 MG per tablet Take 1 tablet by mouth every 6 (six) hours as needed for moderate pain.    Historical Provider, MD  ipratropium (ATROVENT) 0.02 % nebulizer solution Take 0.5 mg by nebulization every 4 (four) hours as needed for wheezing or shortness of breath.    Historical Provider, MD  lidocaine (XYLOCAINE) 5 % ointment Apply 1 application topically daily as needed. 07/23/15   Historical Provider, MD  metoprolol (LOPRESSOR) 50 MG tablet Take 50 mg by mouth 2 (two) times daily. 12/27/13   Historical Provider, MD  nitroGLYCERIN (NITROSTAT) 0.4 MG SL tablet Place 1 tablet (0.4 mg total) under the tongue every 5 (five) minutes x 3 doses as needed for chest pain. 06/11/15   [name], MD  omeprazole (PRILOSEC) 20 MG capsule Take 1 capsule by mouth every morning.  04/21/11   Historical Provider, MD  predniSONE (DELTASONE) 10 MG tablet Take 60mg  po daily for 2 days then 40mg  po daily for 2 days then 30mg  po daily for 2 days then 20mg  po daily for 2 days then 10mg  po daily Patient taking differently: Take 10 mg by mouth daily.  07/05/15   [name], MD  predniSONE (DELTASONE) 10 MG tablet Take 4 tablets (40 mg total) by mouth daily.  08/02/15   Fredia Sorrow, MD  simvastatin (ZOCOR) 40 MG tablet TAKE 1 TABLET (40 MG TOTAL) BY MOUTH EVERY EVENING. 08/29/15   [name], MD    No Known Allergies  Physical Exam  Vitals  Blood pressure 169/84, pulse 88, temperature 97.7 F (36.5 C), temperature source Oral, resp. rate 16, height 5\' 4"  (1.626 m), weight 72.576 kg (160 lb), SpO2 97 %.   1. General elderly male lying in bed in NAD,    2. Normal affect and insight, Not Suicidal or Homicidal, Awake Alert, Oriented X 3.  3. No F.N deficits, ALL C.Nerves Intact, Strength 5/5 all 4 extremities, Sensation intact all 4 extremities, Plantars down going.  4.  Ears and Eyes appear Normal, Conjunctivae clear, PERRLA. Moist Oral Mucosa.  5. Supple Neck, No JVD, No cervical lymphadenopathy appriciated, No Carotid Bruits.  6. Symmetrical Chest wall movement, fair air movement bilaterally, scattered wheezing.  7.  No Gallops, Rubs or Murmurs, No Parasternal Heave.  8. Positive Bowel Sounds, Abdomen Soft, No tenderness, No organomegaly appriciated,No rebound -guarding or rigidity.  9.  No Cyanosis, Normal Skin Turgor, No Skin Rash or Bruise.  10. Good muscle tone,  joints appear normal , no effusions, Normal ROM.    Data Review  CBC  Recent Labs Lab 09/17/15 2330  WBC 9.1  HGB 12.7*  HCT 39.6  PLT 249  MCV 90.0  MCH 28.9  MCHC 32.1  RDW 15.2  LYMPHSABS 2.4  MONOABS 1.5*  EOSABS 0.1  BASOSABS 0.0   ------------------------------------------------------------------------------------------------------------------  Chemistries   Recent Labs Lab 09/17/15 2330  NA 143  K 4.0  CL 108  CO2 26  GLUCOSE 99  BUN 8  CREATININE 0.56*  CALCIUM 9.0   ------------------------------------------------------------------------------------------------------------------ estimated creatinine clearance is 86.4 mL/min (by C-G formula based on Cr of  0.56). ------------------------------------------------------------------------------------------------------------------ No results for input(s): TSH, T4TOTAL, T3FREE, THYROIDAB in the last 72 hours.  Invalid input(s): FREET3   Coagulation profile No results for input(s): INR, PROTIME in the last 168 hours. ------------------------------------------------------------------------------------------------------------------- No results for input(s): DDIMER in the last 72 hours. -------------------------------------------------------------------------------------------------------------------  Cardiac Enzymes  Recent Labs Lab 09/17/15 2330  TROPONINI <0.03   ------------------------------------------------------------------------------------------------------------------ Invalid input(s): POCBNP   ---------------------------------------------------------------------------------------------------------------  Urinalysis    Component Value Date/Time   COLORURINE AMBER BIOCHEMICALS MAY BE AFFECTED BY COLOR* 01/21/2010 1944   APPEARANCEUR CLOUDY* 01/21/2010 1944   LABSPEC 1.027 01/21/2010 1944   PHURINE 5.5 01/21/2010 1944   GLUCOSEU NEGATIVE 01/21/2010 1944   HGBUR MODERATE* 01/21/2010 1944   BILIRUBINUR MODERATE* 01/21/2010 1944   KETONESUR >80* 01/21/2010 1944   PROTEINUR NEGATIVE 01/21/2010 1944   UROBILINOGEN 0.2 01/21/2010 1944   NITRITE NEGATIVE 01/21/2010 1944   LEUKOCYTESUR NEGATIVE 01/21/2010 1944    ----------------------------------------------------------------------------------------------------------------  Imaging results:   Dg Chest Portable 1 View  09/17/2015  CLINICAL DATA:  Shortness of breath and wheezing tonight. EXAM: PORTABLE CHEST 1 VIEW COMPARISON:  08/07/2015. FINDINGS: The cardiac silhouette, mediastinal and hilar contours are within normal limits and stable. There is mild tortuosity and calcification of the thoracic aorta. The lungs demonstrate  stable emphysematous changes and pulmonary scarring. No definite acute overlying pulmonary process. No pleural effusions. IMPRESSION: Emphysematous changes and apical scarring but no definite acute overlying pulmonary process. Electronically Signed   By: Marijo Sanes M.D.   On: 09/17/2015 23:57    My personal review of EKG: Rhythm NSR, Rate  92 /min, QTc 449 , no Acute ST changes    Assessment & Plan  Active Problems:   GERD (gastroesophageal reflux disease)   Dyslipidemia   PAF (paroxysmal atrial fibrillation) (HCC)   COPD exacerbation (HCC)    COPD exacerbation - Chest x-ray with no evidence of pneumonia, will be started on IV Solu-Medrol, nebs, will start on IV levofloxacin giving his, planes of productive dark yellow sputum. - Patient is steroid-dependent, will be transitioned back to his home dose oral steroid.  Paroxysmal atrial fibrillation - Continue with metoprolol for rate control, patient is on Eliquis for anticoagulation.  Chest pain - Nontypical, due to soreness from coughing, EKG nonacute, negative troponin, will cycle cardiac enzymes, will monitor on telemetry.  GERD - Continue with PPI  Hyperlipidemia - Continue with statin  Hypertension -  Continue with metoprolol   DVT Prophylaxis Eliquis  AM Labs Ordered, also please review Full Orders  Family Communication: Admission, patients condition and plan of care including tests being ordered have been discussed with the patient and  who indicate understanding and agree with the plan and Code Status.  Code Status Full  Likely DC to home when stable  Condition GUARDED  Time spent in minutes : 55 minutes    Selam Pietsch M.D on 09/18/2015 at 1:04 AM  Between 7am to 7pm - Pager - [phone]  After 7pm go to [email or web address] - password [email or web address]  And look for the night coverage person covering me after hours  [facility] Office  [phone]

## 2015-09-19 ENCOUNTER — Encounter (HOSPITAL_COMMUNITY): Payer: Self-pay

## 2015-09-19 ENCOUNTER — Emergency Department (HOSPITAL_COMMUNITY): Payer: Medicare Other

## 2015-09-19 ENCOUNTER — Inpatient Hospital Stay (HOSPITAL_COMMUNITY)
Admission: EM | Admit: 2015-09-19 | Discharge: 2015-09-23 | DRG: 192 | Disposition: A | Discharge status: Home / Self Care | Payer: Medicare Other | Attending: Internal Medicine | Admitting: Internal Medicine

## 2015-09-19 DIAGNOSIS — J441 Chronic obstructive pulmonary disease with (acute) exacerbation: Secondary | ICD-10-CM | POA: Diagnosis not present

## 2015-09-19 DIAGNOSIS — K219 Gastro-esophageal reflux disease without esophagitis: Secondary | ICD-10-CM | POA: Diagnosis present

## 2015-09-19 DIAGNOSIS — R0602 Shortness of breath: Secondary | ICD-10-CM | POA: Diagnosis not present

## 2015-09-19 DIAGNOSIS — Z9981 Dependence on supplemental oxygen: Secondary | ICD-10-CM

## 2015-09-19 DIAGNOSIS — Z7952 Long term (current) use of systemic steroids: Secondary | ICD-10-CM

## 2015-09-19 DIAGNOSIS — Z8546 Personal history of malignant neoplasm of prostate: Secondary | ICD-10-CM

## 2015-09-19 DIAGNOSIS — Z8042 Family history of malignant neoplasm of prostate: Secondary | ICD-10-CM

## 2015-09-19 DIAGNOSIS — I2510 Atherosclerotic heart disease of native coronary artery without angina pectoris: Secondary | ICD-10-CM | POA: Diagnosis present

## 2015-09-19 DIAGNOSIS — Z923 Personal history of irradiation: Secondary | ICD-10-CM

## 2015-09-19 DIAGNOSIS — E785 Hyperlipidemia, unspecified: Secondary | ICD-10-CM | POA: Diagnosis present

## 2015-09-19 DIAGNOSIS — Z7901 Long term (current) use of anticoagulants: Secondary | ICD-10-CM

## 2015-09-19 DIAGNOSIS — Z9229 Personal history of other drug therapy: Secondary | ICD-10-CM

## 2015-09-19 DIAGNOSIS — Z720 Tobacco use: Secondary | ICD-10-CM | POA: Diagnosis present

## 2015-09-19 DIAGNOSIS — I252 Old myocardial infarction: Secondary | ICD-10-CM

## 2015-09-19 DIAGNOSIS — I10 Essential (primary) hypertension: Secondary | ICD-10-CM | POA: Diagnosis present

## 2015-09-19 DIAGNOSIS — M1990 Unspecified osteoarthritis, unspecified site: Secondary | ICD-10-CM | POA: Diagnosis present

## 2015-09-19 DIAGNOSIS — Z8249 Family history of ischemic heart disease and other diseases of the circulatory system: Secondary | ICD-10-CM

## 2015-09-19 DIAGNOSIS — Z87891 Personal history of nicotine dependence: Secondary | ICD-10-CM

## 2015-09-19 DIAGNOSIS — I480 Paroxysmal atrial fibrillation: Secondary | ICD-10-CM | POA: Diagnosis present

## 2015-09-19 LAB — CBC WITH DIFFERENTIAL/PLATELET
BASOS ABS: 0 10*3/uL (ref 0.0–0.1)
Basophils Relative: 0 %
Eosinophils Absolute: 0 10*3/uL (ref 0.0–0.7)
Eosinophils Relative: 0 %
HEMATOCRIT: 41.3 % (ref 39.0–52.0)
HEMOGLOBIN: 13.1 g/dL (ref 13.0–17.0)
Lymphocytes Relative: 4 %
Lymphs Abs: 0.9 10*3/uL (ref 0.7–4.0)
MCH: 29 pg (ref 26.0–34.0)
MCHC: 31.7 g/dL (ref 30.0–36.0)
MCV: 91.4 fL (ref 78.0–100.0)
Monocytes Absolute: 2.3 10*3/uL — ABNORMAL HIGH (ref 0.1–1.0)
Monocytes Relative: 12 %
NEUTROS ABS: 16.5 10*3/uL — AB (ref 1.7–7.7)
NEUTROS PCT: 84 %
PLATELETS: 261 10*3/uL (ref 150–400)
RBC: 4.52 MIL/uL (ref 4.22–5.81)
RDW: 15.8 % — ABNORMAL HIGH (ref 11.5–15.5)
WBC: 19.7 10*3/uL — AB (ref 4.0–10.5)

## 2015-09-19 LAB — I-STAT CHEM 8, ED
BUN: 20 mg/dL (ref 6–20)
Calcium, Ion: 1.15 mmol/L (ref 1.13–1.30)
Chloride: 106 mmol/L (ref 101–111)
Creatinine, Ser: 0.8 mg/dL (ref 0.61–1.24)
Glucose, Bld: 118 mg/dL — ABNORMAL HIGH (ref 65–99)
HEMATOCRIT: 42 % (ref 39.0–52.0)
HEMOGLOBIN: 14.3 g/dL (ref 13.0–17.0)
POTASSIUM: 3.9 mmol/L (ref 3.5–5.1)
Sodium: 143 mmol/L (ref 135–145)
TCO2: 26 mmol/L (ref 0–100)

## 2015-09-19 MED ORDER — ALBUTEROL (5 MG/ML) CONTINUOUS INHALATION SOLN
10.0000 mg | INHALATION_SOLUTION | RESPIRATORY_TRACT | Status: AC
  Administered 2015-09-19: 10 mg via RESPIRATORY_TRACT
  Filled 2015-09-19: qty 20

## 2015-09-19 MED ORDER — METHYLPREDNISOLONE SODIUM SUCC 125 MG IJ SOLR
125.0000 mg | Freq: Once | INTRAMUSCULAR | Status: AC
  Administered 2015-09-19: 125 mg via INTRAVENOUS
  Filled 2015-09-19: qty 2

## 2015-09-19 MED ORDER — PREDNISONE 5 MG PO TABS: 5.0000 mg | ORAL_TABLET | Freq: Every day | ORAL | Status: DC

## 2015-09-19 MED ORDER — ALBUTEROL SULFATE HFA 108 (90 BASE) MCG/ACT IN AERS: 2.0000 | INHALATION_SPRAY | RESPIRATORY_TRACT | Status: AC | PRN

## 2015-09-19 MED ORDER — IPRATROPIUM BROMIDE 0.02 % IN SOLN
0.5000 mg | RESPIRATORY_TRACT | Status: AC
  Administered 2015-09-19: 0.5 mg via RESPIRATORY_TRACT
  Filled 2015-09-19: qty 2.5

## 2015-09-19 MED ORDER — ALBUTEROL SULFATE (2.5 MG/3ML) 0.083% IN NEBU
2.5000 mg | INHALATION_SOLUTION | RESPIRATORY_TRACT | Status: DC | PRN
  Administered 2015-09-19: 2.5 mg via RESPIRATORY_TRACT
  Filled 2015-09-19: qty 3

## 2015-09-19 NOTE — ED Provider Notes (Signed)
CSN: [id number]     Arrival date & time 09/19/15  2208 History  By signing my name below, I, Hansel Feinstein, attest that this documentation has been prepared under the direction and in the presence of Noemi Chapel, MD. Electronically Signed: Hansel Feinstein, ED Scribe. 09/19/2015. 10:40 PM.    Chief Complaint  Patient presents with  . Shortness of Breath   The history is provided by the patient. No language interpreter was used.   HPI Comments: Kenneth Merritt is a 64 y.o. male with h/o GERD, COPD, CAD, MI who presents to the Emergency Department complaining of an exacerbation of COPD with recurrent, severe SOB, cough and wheezing onset an hour ago. He states associated difficulty swallowing. Pt was just d/c from a 36 hr stay at [facility] for COPD exacerbation. He received breathing treatments and steroids with relief. Pt states he had just taken his night time medications when the current attack came on tonight. Non smoker for 5 years, no smoke contact. No unusual foods. Pt states he recently got a new cat. He is on 2-3 L/O2 PRN at home. Does not typically wear daily, but notes daily use for the last week. He notes that he wore his O2 for most of the day this evening and took it off for dinner, which he is normally able to do without difficulty. He notes that his symptoms are worse without home PRN O2, but similar to exacerbations of the past. O2 of 87 on RA on arrival. No leg pain.  Past Medical History  Diagnosis Date  . GERD (gastroesophageal reflux disease)   . COPD (chronic obstructive pulmonary disease) ([place])     severe by PFTs August 2010  . 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 Atrial fibrillation (H[facility])     post-op a-fib. 3/11. post Aortobifem , /  Atrial fibrillation from nebulizer treatment  May, 2012, while hospitalized  . Renal artery stenosis ([place])      presumably corrected at time of aortobifem surgery  March, 2011  . Tobacco user      stop smoking  . Pneumothorax      2011  before aortobifemoral surgery,  resolved  . Peripheral vascular disease, unspecified ([place])     bilateral intermittent claudication  . CAD (coronary artery disease), native coronary artery     catheterization 06/2009.Marland KitchenMarland Kitchen70% proximal LAD and 30% proximal RCA. normal LV function, no intervention planned prior to surgery for aorta. LV normal function  by catheter 8/10.   [name] Dyslipidemia   . DJD (degenerative joint disease)   . Ejection fraction     60%, echo, May, 2012, rapid atrial fib at the time  . Cancer [facility]) 2013    Prostate Radiation  . Myocardial infarction [facility]) 2011   Past Surgical History  Procedure Laterality Date  . Abdominal aortic aneurysm repair  01/11/2010  . Pr vein bypass graft,aorto-fem-pop    . Inguinal hernia repair      RIGHT   Family History  Problem Relation Age of Onset  . Cancer Father     prostate  . Heart attack Father    Social History  Substance Use Topics  . Smoking status: Former Smoker -- 2.00 packs/day for 40 years    Types: Cigarettes    Quit date: 01/11/2010  . Smokeless tobacco: Never Used  . Alcohol Use: No    Review  of Systems  HENT: Positive for trouble swallowing.   Respiratory: Positive for cough, shortness of breath and wheezing.   All other systems reviewed and are negative.  Allergies  Review of patient's allergies indicates no known allergies.  Home Medications   Prior to Admission medications   Medication Sig Start Date End Date Taking? Authorizing Provider  albuterol (PROVENTIL HFA;VENTOLIN HFA) 108 (90 BASE) MCG/ACT inhaler Inhale 2 puffs into the lungs every 4 (four) hours as needed for wheezing or shortness of breath. 09/19/15  Yes Donne [name], MD  albuterol (PROVENTIL) (2.5 MG/3ML) 0.083% nebulizer solution Take 3 mLs (2.5 mg total) by nebulization every 4 (four) hours as needed for wheezing or shortness of breath. 03/15/15  Yes [name], MD   apixaban (ELIQUIS) 5 MG TABS tablet Take 1 tablet (5 mg total) by mouth 2 (two) times daily. 06/20/15  Yes [name], MD  gabapentin (NEURONTIN) 300 MG capsule Take 600 mg by mouth 3 (three) times daily.  02/13/14  Yes Historical Provider, MD  HYDROcodone-acetaminophen (NORCO/VICODIN) 5-325 MG per tablet Take 1 tablet by mouth every 6 (six) hours as needed for moderate pain.   Yes Historical Provider, MD  ipratropium (ATROVENT) 0.02 % nebulizer solution Take 0.5 mg by nebulization every 4 (four) hours as needed for wheezing or shortness of breath.   Yes Historical Provider, MD  metoprolol (LOPRESSOR) 50 MG tablet Take 50 mg by mouth 2 (two) times daily. 12/27/13  Yes Historical Provider, MD  nitroGLYCERIN (NITROSTAT) 0.4 MG SL tablet Place 1 tablet (0.4 mg total) under the tongue every 5 (five) minutes x 3 doses as needed for chest pain. 06/11/15  Yes [name], MD  omeprazole (PRILOSEC) 20 MG capsule Take 1 capsule by mouth every morning.  04/21/11  Yes Historical Provider, MD  predniSONE (DELTASONE) 5 MG tablet Take 1 tablet (5 mg total) by mouth daily with breakfast. 09/19/15  Yes Donne [name], MD  simvastatin (ZOCOR) 40 MG tablet TAKE 1 TABLET (40 MG TOTAL) BY MOUTH EVERY EVENING. 08/29/15  Yes [name], MD  lidocaine (XYLOCAINE) 5 % ointment Apply 1 application topically daily as needed for mild pain or moderate pain.  07/23/15   Historical Provider, MD   BP 131/83 mmHg  Pulse 93  Temp(Src) 97.8 F (36.6 C) (Oral)  Resp 28  Ht 5\' 4"  (1.626 m)  Wt 148 lb (67.132 kg)  BMI 25.39 kg/m2  SpO2 100% Physical Exam  Constitutional: He appears well-developed and well-nourished. No distress.  HENT:  Head: Normocephalic and atraumatic.  Mouth/Throat: Oropharynx is clear and moist. No oropharyngeal exudate.  Eyes: Conjunctivae and EOM are normal. Pupils are equal, round, and reactive to light. Right eye exhibits no discharge. Left eye exhibits no discharge. No scleral icterus.  Neck:  Normal range of motion. Neck supple. No JVD present. No thyromegaly present.  Cardiovascular: Normal rate, regular rhythm, normal heart sounds and intact distal pulses.  Exam reveals no gallop and no friction rub.   No murmur heard. Pulmonary/Chest: He is in respiratory distress. He has wheezes. He has no rales.  Respiratory distress. Increased WOB accessory muscle use.   Abdominal: Soft. Bowel sounds are normal. He exhibits no distension and no mass. There is no tenderness.  Musculoskeletal: Normal range of motion. He exhibits edema. He exhibits no tenderness.  Mild edema bilaterally.   Lymphadenopathy:    He has no cervical adenopathy.  Neurological: He is alert. Coordination normal.  Skin: Skin is warm and dry.  No rash noted. No erythema.  Psychiatric: He has a normal mood and affect. His behavior is normal.  Nursing note and vitals reviewed.   ED Course  Procedures (including critical care time) DIAGNOSTIC STUDIES: Oxygen Saturation is 100% on [place] 3L/O2, normal by my interpretation.    COORDINATION OF CARE: 10:18 PM Discussed treatment plan with pt at bedside and pt agreed to plan.   Labs Review Labs Reviewed  CBC WITH DIFFERENTIAL/PLATELET - Abnormal; Notable for the following:    WBC 19.7 (*)    RDW 15.8 (*)    Neutro Abs 16.5 (*)    Monocytes Absolute 2.3 (*)    All other components within normal limits  I-STAT CHEM 8, ED - Abnormal; Notable for the following:    Glucose, Bld 118 (*)    All other components within normal limits    Imaging Review Dg Chest Port 1 View  09/19/2015  CLINICAL DATA:  Initial evaluation for shortness of breath, wheezing, nonproductive cough since yesterday. EXAM: PORTABLE CHEST 1 VIEW COMPARISON:  Prior radiograph from 09/19/2015. FINDINGS: Patient is rotated to the right. The cardiac and mediastinal silhouettes are stable in size and contour, and remain within normal limits. The lungs are hyperinflated with attenuation the pulmonary markings,  consistent with COPD, similar to prior. Asymmetric opacity at the peripheral right upper lobe favored to related to patient rotation. No airspace consolidation, pleural effusion, or pulmonary edema is identified. There is no pneumothorax. No acute osseous abnormality identified. IMPRESSION: 1. Emphysema.  No other active cardiopulmonary disease. 2. Asymmetric opacity at the peripheral right upper lobe, favored to be related to patient positioning/technique. Electronically Signed   By: Jeannine Boga M.D.   On: 09/19/2015 22:47   Dg Chest Portable 1 View  09/17/2015  CLINICAL DATA:  Shortness of breath and wheezing tonight. EXAM: PORTABLE CHEST 1 VIEW COMPARISON:  08/07/2015. FINDINGS: The cardiac silhouette, mediastinal and hilar contours are within normal limits and stable. There is mild tortuosity and calcification of the thoracic aorta. The lungs demonstrate stable emphysematous changes and pulmonary scarring. No definite acute overlying pulmonary process. No pleural effusions. IMPRESSION: Emphysematous changes and apical scarring but no definite acute overlying pulmonary process. Electronically Signed   By: Marijo Sanes M.D.   On: 09/17/2015 23:57   I have personally reviewed and evaluated these images and lab results as part of my medical decision-making.   EKG Interpretation   Date/Time:  Wednesday September 19 2015 22:25:05 EST Ventricular Rate:  91 PR Interval:  153 QRS Duration: 86 QT Interval:  354 QTC Calculation: 435 R Axis:   60 Text Interpretation:  Sinus rhythm Baseline wander in lead(s) V1 since  last tracing no significant change Confirmed by Sabra Heck  MD, Carys Malina ([id number])  on 09/19/2015 10:40:56 PM      MDM   Final diagnoses:  Chronic obstructive pulmonary disease with acute exacerbation (HCC)    The patient is improving on nebulizer therapy, he has severe COPD, his acute exacerbation will likely require readmission to the hospital. Meds given as below, chest x-ray labs  reviewed, leukocytosis has developed that this could be related to prednisone. He now reports that there is a new exposure of a new pet in the house as of yesterday, a stray cat that has fleas  Meds given in ED:  Medications  albuterol (PROVENTIL,VENTOLIN) solution continuous neb (10 mg Nebulization New Bag/Given 09/19/15 2237)  ipratropium (ATROVENT) nebulizer solution 0.5 mg (0.5 mg Nebulization Given 09/19/15 2237)  methylPREDNISolone sodium succinate (SOLU-MEDROL)  125 mg/2 mL injection 125 mg (125 mg Intravenous Given 09/19/15 2233)    New Prescriptions   No medications on file   I personally performed the services described in this documentation, which was scribed in my presence. The recorded information has been reviewed and is accurate.         Noemi Chapel, MD 09/19/15 [phone]

## 2015-09-19 NOTE — Care Management Note (Signed)
Case Management Note  Patient Details  Name: Kenneth Merritt MRN: [id number] Date of Birth: February 12, 1951  Subjective/Objective:                    Action/Plan:   Expected Discharge Date:                  Expected Discharge Plan:  Home/Self Care  In-House Referral:  NA  Discharge planning Services  CM Consult  Post Acute Care Choice:  NA Choice offered to:  NA  DME Arranged:    DME Agency:     HH Arranged:    HH Agency:     Status of Service:  Completed, signed off  Medicare Important Message Given:    Date Medicare IM Given:    Medicare IM give by:    Date Additional Medicare IM Given:    Additional Medicare Important Message give by:     If discussed at [place] of Stay Meetings, dates discussed:    Additional Comments: Pt discharged home today. No CM needs noted. Christinia Gully [place], RN 09/19/2015, 11:17 AM

## 2015-09-19 NOTE — Progress Notes (Signed)
1156 - AVS reviewed with patient.  Verbalized understanding of discharge instructions, physician follow-up, and medications.  Prescriptions provided to patient.  Patient's IV removed.  Site WNL.  Patient transported by NT via w/c to main entrance for discharge.  Patient transported by wife home in private vehicle.  Patient's wife to provided home O2 tank for transport home.  Patient stable at time of discharge.  Patient to pickup inhaler and Prednisone at discharge.

## 2015-09-19 NOTE — ED Notes (Signed)
Pt reports sob x 1 hour.  Reports was admitted 2 days ago and was discharged today.  Denies pain.  Reports is on home 02 prn at 2-3 liters.  Pt says has been using inhaler without relief.

## 2015-09-19 NOTE — Care Management Important Message (Signed)
Important Message  Patient Details  Name: Kenneth Merritt MRN: [id number] Date of Birth: 03-Jul-1951   Medicare Important Message Given:  N/A - LOS <3 / Initial given by admissions    Joylene Draft, RN 09/19/2015, 11:18 AM

## 2015-09-19 NOTE — ED Notes (Signed)
   09/19/15 2239  Respiratory  Respiratory (WDL) X  Bilateral Breath Sounds Expiratory wheezes;Inspiratory wheezes  L Breath Sounds Expiratory wheezes;Inspiratory wheezes  R Breath Sounds Expiratory wheezes;Inspiratory wheezes  Respiratory Pattern Labored  Chest Assessment Chest expansion symmetrical  O2 Device Nasal Cannula  O2 Flow Rate (L/min) 3 L/min  pt c/o increased sob since taking oxygen off to go out to eat dinner.

## 2015-09-19 NOTE — Discharge Summary (Addendum)
Physician Discharge Summary  Kenneth Merritt EVO:[id number] DOB: February 13, 1951 DOA: 09/17/2015  PCP: Neale Burly, MD  Admit date: 09/17/2015 Discharge date: 09/19/2015  Time spent: 20 minutes  Recommendations for Outpatient Follow-up:  1. Follow up with PCP in 1-2 weeks   Discharge Diagnoses:  Active Problems:   GERD (gastroesophageal reflux disease)   Dyslipidemia   PAF (paroxysmal atrial fibrillation) (HCC)   COPD exacerbation (HCC)   Chronic respiratory failure (Kenneth Merr[facility])   Discharge Condition: Improved  Diet recommendation: Heart Healthy  Filed Weights   09/17/15 2304 09/18/15 0157  Weight: 72.576 kg (160 lb) 67.405 kg (148 lb 9.6 oz)    History of present illness:  Please review dictated H and P from 11/8 for details. Briefly, 64yo admitted for COPD exacerbation. He has been started on steroids, abx and bronchodilators.  Hospital Course:  COPD exacerbation - Chest x-ray with no evidence of pneumonia -Patient was started on scheduled nebs and IV steroids with rapid improvement. - Patient's O2 requirements improved to RA and patient was medically stable to wean steroids to his baseline PO 10mg  daily prednisone  Paroxysmal atrial fibrillation - Patient was continued with metoprolol for rate control, patient is on Eliquis for anticoagulation. -CHADS-VASc of 2  Chest pain - Nontypical, likely secondary to chest wall pain from coughing, EKG nonacute, negative troponin, cardiac enzymes neg x 4  GERD - Continued with PPI  Hyperlipidemia - Continued with statin  Hypertension - Continued with metoprolol  DVT Prophylaxis Patient continued on Eliquis while admitted   Discharge Exam: Filed Vitals:   09/19/15 0352 09/19/15 0634 09/19/15 0738 09/19/15 1123  BP:  109/54  118/81  Pulse:  90  108  Temp:  97.7 F (36.5 C)    TempSrc:  Oral    Resp:  17    Height:      Weight:      SpO2: 96% 99% 100% 99%    General: Awake, in nad Cardiovascular: regular, s1,  s2 Respiratory: normal resp effort, trace end-expiratory wheezing bilaterally  Discharge Instructions     Medication List    TAKE these medications        albuterol (2.5 MG/3ML) 0.083% nebulizer solution  Commonly known as:  PROVENTIL  Take 3 mLs (2.5 mg total) by nebulization every 4 (four) hours as needed for wheezing or shortness of breath.     albuterol 108 (90 BASE) MCG/ACT inhaler  Commonly known as:  PROVENTIL HFA;VENTOLIN HFA  Inhale 2 puffs into the lungs every 4 (four) hours as needed for wheezing or shortness of breath.     apixaban 5 MG Tabs tablet  Commonly known as:  ELIQUIS  Take 1 tablet (5 mg total) by mouth 2 (two) times daily.     gabapentin 300 MG capsule  Commonly known as:  NEURONTIN  Take 600 mg by mouth 3 (three) times daily.     HYDROcodone-acetaminophen 5-325 MG tablet  Commonly known as:  NORCO/VICODIN  Take 1 tablet by mouth every 6 (six) hours as needed for moderate pain.     ipratropium 0.02 % nebulizer solution  Commonly known as:  ATROVENT  Take 0.5 mg by nebulization every 4 (four) hours as needed for wheezing or shortness of breath.     lidocaine 5 % ointment  Commonly known as:  XYLOCAINE  Apply 1 application topically daily as needed for mild pain or moderate pain.     metoprolol 50 MG tablet  Commonly known as:  LOPRESSOR  Take 50 mg  by mouth 2 (two) times daily.     nitroGLYCERIN 0.4 MG SL tablet  Commonly known as:  NITROSTAT  Place 1 tablet (0.4 mg total) under the tongue every 5 (five) minutes x 3 doses as needed for chest pain.     omeprazole 20 MG capsule  Commonly known as:  PRILOSEC  Take 1 capsule by mouth every morning.     predniSONE 5 MG tablet  Commonly known as:  DELTASONE  Take 1 tablet (5 mg total) by mouth daily with breakfast.     simvastatin 40 MG tablet  Commonly known as:  ZOCOR  TAKE 1 TABLET (40 MG TOTAL) BY MOUTH EVERY EVENING.       No Known Allergies Follow-up Information    Follow up with  Neale Burly, MD On 09/25/2015.   Specialty:  Internal Medicine   Why:  at 4:30 pm   Contact information:   [address] [place] [phone]        The results of significant diagnostics from this hospitalization (including imaging, microbiology, ancillary and laboratory) are listed below for reference.    Significant Diagnostic Studies: Dg Chest Portable 1 View  09/17/2015  CLINICAL DATA:  Shortness of breath and wheezing tonight. EXAM: PORTABLE CHEST 1 VIEW COMPARISON:  08/07/2015. FINDINGS: The cardiac silhouette, mediastinal and hilar contours are within normal limits and stable. There is mild tortuosity and calcification of the thoracic aorta. The lungs demonstrate stable emphysematous changes and pulmonary scarring. No definite acute overlying pulmonary process. No pleural effusions. IMPRESSION: Emphysematous changes and apical scarring but no definite acute overlying pulmonary process. Electronically Signed   By: Marijo Sanes M.D.   On: 09/17/2015 23:57    Microbiology: No results found for this or any previous visit (from the past 240 hour(s)).   Labs: Basic Metabolic Panel:  Recent Labs Lab 09/17/15 2330 09/18/15 0818  NA 143 142  K 4.0 4.2  CL 108 107  CO2 26 25  GLUCOSE 99 174*  BUN 8 8  CREATININE 0.56* 0.68  CALCIUM 9.0 8.8*   Liver Function Tests: No results for input(s): AST, ALT, ALKPHOS, BILITOT, PROT, ALBUMIN in the last 168 hours. No results for input(s): LIPASE, AMYLASE in the last 168 hours. No results for input(s): AMMONIA in the last 168 hours. CBC:  Recent Labs Lab 09/17/15 2330 09/18/15 0818  WBC 9.1 8.7  NEUTROABS 5.1  --   HGB 12.7* 12.4*  HCT 39.6 39.2  MCV 90.0 90.3  PLT 249 257   Cardiac Enzymes:  Recent Labs Lab 09/17/15 2330 09/18/15 0253 09/18/15 0818 09/18/15 1436  TROPONINI <0.03 <0.03 <0.03 <0.03   BNP: BNP (last 3 results)  Recent Labs  01/17/15 0029 07/03/15 0345 08/02/15 1932  BNP 42.0  28.0 42.0    ProBNP (last 3 results) No results for input(s): PROBNP in the last 8760 hours.  CBG: No results for input(s): GLUCAP in the last 168 hours.   Signed:  CHIU, Orpah Melter  [facility] 09/19/2015, 4:19 PM

## 2015-09-20 ENCOUNTER — Encounter (HOSPITAL_COMMUNITY): Payer: Self-pay | Admitting: Internal Medicine

## 2015-09-20 DIAGNOSIS — Z8546 Personal history of malignant neoplasm of prostate: Secondary | ICD-10-CM | POA: Diagnosis not present

## 2015-09-20 DIAGNOSIS — Z87891 Personal history of nicotine dependence: Secondary | ICD-10-CM | POA: Diagnosis not present

## 2015-09-20 DIAGNOSIS — I480 Paroxysmal atrial fibrillation: Secondary | ICD-10-CM | POA: Diagnosis present

## 2015-09-20 DIAGNOSIS — Z8249 Family history of ischemic heart disease and other diseases of the circulatory system: Secondary | ICD-10-CM | POA: Diagnosis not present

## 2015-09-20 DIAGNOSIS — K219 Gastro-esophageal reflux disease without esophagitis: Secondary | ICD-10-CM | POA: Diagnosis present

## 2015-09-20 DIAGNOSIS — Z923 Personal history of irradiation: Secondary | ICD-10-CM | POA: Diagnosis not present

## 2015-09-20 DIAGNOSIS — I252 Old myocardial infarction: Secondary | ICD-10-CM | POA: Diagnosis not present

## 2015-09-20 DIAGNOSIS — Z7901 Long term (current) use of anticoagulants: Secondary | ICD-10-CM | POA: Diagnosis not present

## 2015-09-20 DIAGNOSIS — I10 Essential (primary) hypertension: Secondary | ICD-10-CM | POA: Diagnosis present

## 2015-09-20 DIAGNOSIS — M1990 Unspecified osteoarthritis, unspecified site: Secondary | ICD-10-CM | POA: Diagnosis present

## 2015-09-20 DIAGNOSIS — Z9981 Dependence on supplemental oxygen: Secondary | ICD-10-CM | POA: Diagnosis not present

## 2015-09-20 DIAGNOSIS — R0602 Shortness of breath: Secondary | ICD-10-CM | POA: Diagnosis present

## 2015-09-20 DIAGNOSIS — Z7952 Long term (current) use of systemic steroids: Secondary | ICD-10-CM | POA: Diagnosis not present

## 2015-09-20 DIAGNOSIS — E785 Hyperlipidemia, unspecified: Secondary | ICD-10-CM | POA: Diagnosis present

## 2015-09-20 DIAGNOSIS — J441 Chronic obstructive pulmonary disease with (acute) exacerbation: Secondary | ICD-10-CM | POA: Diagnosis present

## 2015-09-20 DIAGNOSIS — Z8042 Family history of malignant neoplasm of prostate: Secondary | ICD-10-CM | POA: Diagnosis not present

## 2015-09-20 LAB — TROPONIN I: Troponin I: <0.03 ng/mL (ref <0.031)

## 2015-09-20 MED ORDER — SODIUM CHLORIDE 0.9 % IJ SOLN
3.0000 mL | Freq: Two times a day (BID) | INTRAMUSCULAR | Status: DC
  Administered 2015-09-20 – 2015-09-23 (×8): 3 mL via INTRAVENOUS

## 2015-09-20 MED ORDER — ALBUTEROL SULFATE (2.5 MG/3ML) 0.083% IN NEBU
2.5000 mg | INHALATION_SOLUTION | RESPIRATORY_TRACT | Status: DC | PRN
  Administered 2015-09-20 – 2015-09-22 (×4): 2.5 mg via RESPIRATORY_TRACT
  Filled 2015-09-20 (×2): qty 3

## 2015-09-20 MED ORDER — CEFUROXIME SODIUM 1.5 G IJ SOLR
INTRAMUSCULAR | Status: AC
  Filled 2015-09-20: qty 1.5

## 2015-09-20 MED ORDER — METOPROLOL TARTRATE 50 MG PO TABS
50.0000 mg | ORAL_TABLET | Freq: Two times a day (BID) | ORAL | Status: DC
  Administered 2015-09-20 – 2015-09-23 (×7): 50 mg via ORAL
  Filled 2015-09-20 (×7): qty 1

## 2015-09-20 MED ORDER — SIMVASTATIN 20 MG PO TABS
40.0000 mg | ORAL_TABLET | Freq: Every day | ORAL | Status: DC
  Administered 2015-09-20 – 2015-09-22 (×3): 40 mg via ORAL
  Filled 2015-09-20 (×3): qty 2

## 2015-09-20 MED ORDER — GABAPENTIN 300 MG PO CAPS
300.0000 mg | ORAL_CAPSULE | Freq: Three times a day (TID) | ORAL | Status: DC
  Administered 2015-09-20 – 2015-09-23 (×8): 300 mg via ORAL
  Filled 2015-09-20 (×3): qty 1
  Filled 2015-09-20: qty 3
  Filled 2015-09-20 (×4): qty 1

## 2015-09-20 MED ORDER — CETYLPYRIDINIUM CHLORIDE 0.05 % MT LIQD
7.0000 mL | Freq: Two times a day (BID) | OROMUCOSAL | Status: DC
  Administered 2015-09-20 – 2015-09-23 (×7): 7 mL via OROMUCOSAL

## 2015-09-20 MED ORDER — SODIUM CHLORIDE 0.9 % IJ SOLN: 3.0000 mL | INTRAMUSCULAR | Status: DC | PRN

## 2015-09-20 MED ORDER — DEXTROSE 5 % IV SOLN
750.0000 mg | Freq: Three times a day (TID) | INTRAVENOUS | Status: DC
  Administered 2015-09-20 – 2015-09-22 (×4): 750 mg via INTRAVENOUS
  Filled 2015-09-20 (×14): qty 750

## 2015-09-20 MED ORDER — PANTOPRAZOLE SODIUM 40 MG PO TBEC
40.0000 mg | DELAYED_RELEASE_TABLET | Freq: Every day | ORAL | Status: DC
Start: 2015-09-20 — End: 2015-09-23
  Administered 2015-09-20 – 2015-09-23 (×4): 40 mg via ORAL
  Filled 2015-09-20 (×4): qty 1

## 2015-09-20 MED ORDER — ALBUTEROL SULFATE (2.5 MG/3ML) 0.083% IN NEBU
2.5000 mg | INHALATION_SOLUTION | Freq: Four times a day (QID) | RESPIRATORY_TRACT | Status: DC
  Administered 2015-09-20 – 2015-09-21 (×6): 2.5 mg via RESPIRATORY_TRACT
  Filled 2015-09-20 (×8): qty 3

## 2015-09-20 MED ORDER — METHYLPREDNISOLONE SODIUM SUCC 40 MG IJ SOLR
40.0000 mg | Freq: Four times a day (QID) | INTRAMUSCULAR | Status: DC
  Administered 2015-09-20 – 2015-09-21 (×4): 40 mg via INTRAVENOUS
  Filled 2015-09-20 (×5): qty 1

## 2015-09-20 MED ORDER — APIXABAN 5 MG PO TABS
5.0000 mg | ORAL_TABLET | Freq: Two times a day (BID) | ORAL | Status: DC
  Administered 2015-09-20 – 2015-09-23 (×7): 5 mg via ORAL
  Filled 2015-09-20 (×7): qty 1

## 2015-09-20 MED ORDER — SODIUM CHLORIDE 0.9 % IV SOLN
250.0000 mL | INTRAVENOUS | Status: DC | PRN
  Administered 2015-09-20: 250 mL via INTRAVENOUS

## 2015-09-20 NOTE — Care Management Note (Signed)
Case Management Note  Patient Details  Name: Kenneth Merritt MRN: [id number] Date of Birth: Oct 24, 1951  Subjective/Objective:                  Pt is from home, was DC'd yesterday, says he "lost his breath" and came back. Pt admitted for COPD. Pt states he feels better now and feels he could go home. Pt has home O2 and reports wearing it when he became SOB.   Action/Plan: Anticipate return home with self care today. No CM needs.   Expected Discharge Date:     09/21/2015             Expected Discharge Plan:  Home/Self Care  In-House Referral:  NA  Discharge planning Services  CM Consult  Post Acute Care Choice:  NA Choice offered to:  NA  DME Arranged:    DME Agency:     HH Arranged:    HH Agency:     Status of Service:  Completed, signed off  Medicare Important Message Given:    Date Medicare IM Given:    Medicare IM give by:    Date Additional Medicare IM Given:    Additional Medicare Important Message give by:     If discussed at [place] of Stay Meetings, dates discussed:    Additional Comments:  Sherald Barge, RN 09/20/2015, 10:18 AM

## 2015-09-20 NOTE — Progress Notes (Signed)
SATURATION QUALIFICATIONS: (This note is used to comply with regulatory documentation for home oxygen)  Patient Saturations on Room Air at Rest = 100%  Patient Saturations on Room Air while Ambulating = 95-97%  Patient Saturations on 2 Liters of oxygen while Ambulating = 100%  Please briefly explain why patient needs home oxygen:

## 2015-09-20 NOTE — H&P (Signed)
[facility] History and Physical  Kenneth Merritt [id number] DOB: [birth year]    PCP:   Neale Burly, MD   Chief Complaint: Returned to the ER with more wheezing.  HPI:   Kenneth Merritt is an 64 y.o. male with hx COPD on home oxygen and chronic steroids, afib on anticoagulation with Eliquis, hx of GERD, PVD s/p bypass, RAS, prstate cancer, CAD, returned to the ER after being seen earlier and released with increase SOB and wheezing.   I last admitted him in May 2016 then again August 2016 for same presentation He did not smoke and denied being exposed to second hand smoke.Evalaution in the ER showed CXR with no PNA, WBC of 19K,  with normal renal fx tests. He was given neb Tx and IV steroids,  and hospitalist was asked to admit him for COPD exacerbation.  Since I last saw him, he said he had visited Dr Luan Pulling.     Rewiew of Systems:  Constitutional: Negative for malaise, fever and chills. No significant weight loss or weight gain Eyes: Negative for eye pain, redness and discharge, diplopia, visual changes, or flashes of light. ENMT: Negative for ear pain, hoarseness, nasal congestion, sinus pressure and sore throat. No headaches; tinnitus, drooling, or problem swallowing. Cardiovascular: Negative for chest pain, palpitations, diaphoresis, dyspnea and peripheral edema. ; No orthopnea, PND Respiratory: Negative for  hemoptysis,  and stridor. No pleuritic chestpain. Gastrointestinal: Negative for nausea, vomiting, diarrhea, constipation, abdominal pain, melena, blood in stool, hematemesis, jaundice and rectal bleeding.    Genitourinary: Negative for frequency, dysuria, incontinence,flank pain and hematuria; Musculoskeletal: Negative for back pain and neck pain. Negative for swelling and trauma.;  Skin: . Negative for pruritus, rash, abrasions, bruising and skin lesion.; ulcerations Neuro: Negative for headache, lightheadedness and neck stiffness. Negative for weakness, altered level of  consciousness , altered mental status, extremity weakness, burning feet, involuntary movement, seizure and syncope.  Psych: negative for anxiety, depression, insomnia, tearfulness, panic attacks, hallucinations, paranoia, suicidal or homicidal ideation    Past Medical History  Diagnosis Date  . GERD (gastroesophageal reflux disease)   . COPD (chronic obstructive pulmonary disease) ([place])     severe by PFTs August 2010  . 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 Atrial fibrillation (HCC)     post-op a-fib. 3/11. post Aortobifem , /  Atrial fibrillation from nebulizer treatment  May, 2012, while hospitalized  . Renal artery stenosis ([place])      presumably corrected at time of aortobifem surgery March, 2011  . Tobacco user      stop smoking  . Pneumothorax      2011  before aortobifemoral surgery,  resolved  . Peripheral vascular disease, unspecified ([place])     bilateral intermittent claudication  . CAD (coronary artery disease), native coronary artery     catheterization 06/2009.Marland KitchenMarland Kitchen70% proximal LAD and 30% proximal RCA. normal LV function, no intervention planned prior to surgery for aorta. LV normal function  by catheter 8/10.   [name] Dyslipidemia   . DJD (degenerative joint disease)   . Ejection fraction     60%, echo, May, 2012, rapid atrial fib at the time  . Cancer [facility]) 2013    Prostate Radiation  . Myocardial infarction [facility]) 2011    Past Surgical History  Procedure Laterality Date  . Abdominal aortic aneurysm repair  01/11/2010  . Pr  vein bypass graft,aorto-fem-pop    . Inguinal hernia repair      RIGHT    Medications:  HOME MEDS: Prior to Admission medications   Medication Sig Start Date End Date Taking? Authorizing Provider  albuterol (PROVENTIL HFA;VENTOLIN HFA) 108 (90 BASE) MCG/ACT inhaler Inhale 2 puffs into the lungs every 4 (four) hours as needed for wheezing or shortness of  breath. 09/19/15  Yes Donne [name], MD  albuterol (PROVENTIL) (2.5 MG/3ML) 0.083% nebulizer solution Take 3 mLs (2.5 mg total) by nebulization every 4 (four) hours as needed for wheezing or shortness of breath. 03/15/15  Yes [name], MD  apixaban (ELIQUIS) 5 MG TABS tablet Take 1 tablet (5 mg total) by mouth 2 (two) times daily. 06/20/15  Yes [name], MD  gabapentin (NEURONTIN) 300 MG capsule Take 600 mg by mouth 3 (three) times daily.  02/13/14  Yes Historical Provider, MD  HYDROcodone-acetaminophen (NORCO/VICODIN) 5-325 MG per tablet Take 1 tablet by mouth every 6 (six) hours as needed for moderate pain.   Yes Historical Provider, MD  ipratropium (ATROVENT) 0.02 % nebulizer solution Take 0.5 mg by nebulization every 4 (four) hours as needed for wheezing or shortness of breath.   Yes Historical Provider, MD  metoprolol (LOPRESSOR) 50 MG tablet Take 50 mg by mouth 2 (two) times daily. 12/27/13  Yes Historical Provider, MD  nitroGLYCERIN (NITROSTAT) 0.4 MG SL tablet Place 1 tablet (0.4 mg total) under the tongue every 5 (five) minutes x 3 doses as needed for chest pain. 06/11/15  Yes [name], MD  omeprazole (PRILOSEC) 20 MG capsule Take 1 capsule by mouth every morning.  04/21/11  Yes Historical Provider, MD  predniSONE (DELTASONE) 5 MG tablet Take 1 tablet (5 mg total) by mouth daily with breakfast. 09/19/15  Yes Donne [name], MD  simvastatin (ZOCOR) 40 MG tablet TAKE 1 TABLET (40 MG TOTAL) BY MOUTH EVERY EVENING. 08/29/15  Yes [name], MD  lidocaine (XYLOCAINE) 5 % ointment Apply 1 application topically daily as needed for mild pain or moderate pain.  07/23/15   Historical Provider, MD     Allergies:  Allergies  Allergen Reactions  . Levaquin [Levofloxacin In D5w]     Pt states it caused dry mouth and rectal bleeding.     Social History:   reports that he quit smoking about 5 years ago. His smoking use included Cigarettes. He has a 80 pack-year smoking history. He has  never used smokeless tobacco. He reports that he does not drink alcohol or use illicit drugs.  Family History: Family History  Problem Relation Age of Onset  . Cancer Father     prostate  . Heart attack Father      Physical Exam: Filed Vitals:   09/19/15 2219 09/19/15 2220 09/19/15 2242 09/19/15 2344  BP: 131/83   134/82  Pulse: 93   88  Temp: 97.8 F (36.6 C)     TempSrc: Oral     Resp: 28   17  Height: 5\' 4"  (1.626 m)     Weight: 67.132 kg (148 lb)     SpO2: 87% 100% 100% 100%   Blood pressure 134/82, pulse 88, temperature 97.8 F (36.6 C), temperature source Oral, resp. rate 17, height 5\' 4"  (1.626 m), weight 67.132 kg (148 lb), SpO2 100 %.  GEN:  Pleasant  patient lying in the stretcher in no acute distress; cooperative with exam. PSYCH:  alert and oriented x4; does not appear anxious or depressed;  affect is appropriate. HEENT: Mucous membranes pink and anicteric; PERRLA; EOM intact; no cervical lymphadenopathy nor thyromegaly or carotid bruit; no JVD; There were no stridor. Neck is very supple. Breasts:: Not examined CHEST WALL: No tenderness CHEST: Normal respiration, wheezing hear to both insp and exp  HEART: Regular rate and rhythm.  There are no murmur, rub, or gallops.   BACK: No kyphosis or scoliosis; no CVA tenderness ABDOMEN: soft and non-tender; no masses, no organomegaly, normal abdominal bowel sounds; no pannus; no intertriginous candida. There is no rebound and no distention. Rectal Exam: Not done EXTREMITIES: No bone or joint deformity; age-appropriate arthropathy of the hands and knees; no edema; no ulcerations.  There is no calf tenderness. Genitalia: not examined PULSES: 2+ and symmetric SKIN: Normal hydration no rash or ulceration CNS: Cranial nerves 2-12 grossly intact no focal lateralizing neurologic deficit.  Speech is fluent; uvula elevated with phonation, facial symmetry and tongue midline. DTR are normal bilaterally, cerebella exam is intact,  barbinski is negative and strengths are equaled bilaterally.  No sensory loss.   Labs on Admission:  Basic Metabolic Panel:  Recent Labs Lab 09/17/15 2330 09/18/15 0818 09/19/15 2240  NA 143 142 143  K 4.0 4.2 3.9  CL 108 107 106  CO2 26 25  --   GLUCOSE 99 174* 118*  BUN 8 8 20   CREATININE 0.56* 0.68 0.80  CALCIUM 9.0 8.8*  --    CBC:  Recent Labs Lab 09/17/15 2330 09/18/15 0818 09/19/15 2225 09/19/15 2240  WBC 9.1 8.7 19.7*  --   NEUTROABS 5.1  --  16.5*  --   HGB 12.7* 12.4* 13.1 14.3  HCT 39.6 39.2 41.3 42.0  MCV 90.0 90.3 91.4  --   PLT 249 257 261  --    Cardiac Enzymes:  Recent Labs Lab 09/17/15 2330 09/18/15 0253 09/18/15 0818 09/18/15 1436  TROPONINI <0.03 <0.03 <0.03 <0.03    Radiological Exams on Admission: Dg Chest Port 1 View  09/19/2015  CLINICAL DATA:  Initial evaluation for shortness of breath, wheezing, nonproductive cough since yesterday. EXAM: PORTABLE CHEST 1 VIEW COMPARISON:  Prior radiograph from 09/19/2015. FINDINGS: Patient is rotated to the right. The cardiac and mediastinal silhouettes are stable in size and contour, and remain within normal limits. The lungs are hyperinflated with attenuation the pulmonary markings, consistent with COPD, similar to prior. Asymmetric opacity at the peripheral right upper lobe favored to related to patient rotation. No airspace consolidation, pleural effusion, or pulmonary edema is identified. There is no pneumothorax. No acute osseous abnormality identified. IMPRESSION: 1. Emphysema.  No other active cardiopulmonary disease. 2. Asymmetric opacity at the peripheral right upper lobe, favored to be related to patient positioning/technique. Electronically Signed   By: Jeannine Boga M.D.   On: 09/19/2015 22:47    EKG: Independently reviewed.    Assessment/Plan Present on Admission:  . COPD with exacerbation ([place]) . Dyslipidemia . PAF (paroxysmal atrial fibrillation) ([place]) . Tobacco user . CAD  (coronary artery disease), native coronary artery . COPD exacerbation ([place])  PLAN:  PLAN: Will admit him for COPD exacerbation. Will continue with IV Solumedrol, nebs, and will give IV Zenicef as he requested no Zithromax and is allergic to Levoquin  (inpatient tx of COPD exacerbation). Please be cognizant that he is on chronic steroid when transitioning him to oral steroids. For his afib, he is rate controlled, and will continue his anticoagulation. He has been on Eliquis 5mg  BID. He will be placed on telemetry.  He is  stable, full code, and will be admitted to [facility] service. Dr Natasha Bence, thank you for allowing me to participate in the care of your nice patient.   Other plans as per orders.  Code Status: FULL Haskel Khan, MD. [facility] Pager [phone] 7pm to 7am.  09/20/2015, 12:32 AM

## 2015-09-20 NOTE — Progress Notes (Signed)
[facility] PROGRESS NOTE  Kenneth Merritt [id number] DOB: Aug 27, 1951 DOA: 09/19/2015 PCP: Kenneth Burly, MD  HPI/Brief narrative 64yo recently admitted for COPD exacerbation, returns for acute flare  Assessment/Plan: COPD exacerbation - Recent chest x-ray with no evidence of pneumonia -Patient was continued on scheduled nebs and IV steroids -Pt continues with B wheezing throughout -Pt continued on empiric cefuroxime -Cont to wean O2 as tolerated  Paroxysmal atrial fibrillation - Continue metoprolol for rate control - Continue Eliquis for anticoagulation.  GERD - Continued with PPI  Hyperlipidemia - Continued with statin  Hypertension - Continued with metoprolol - BP stable  DVT Prophylaxis Patient is continued on Eliquis while admitted  Code Status: Full Family Communication: Pt in room Disposition Plan: Pending   Consultants:    Procedures:    Antibiotics: Anti-infectives    Start     Dose/Rate Route Frequency Ordered Stop   09/20/15 0300  cefUROXime (ZINACEF) 750 mg in dextrose 5 % 50 mL IVPB     750 mg 100 mL/hr over 30 Minutes Intravenous Every 8 hours 09/20/15 0213        HPI/Subjective: Still sob  Objective: Filed Vitals:   09/20/15 0546 09/20/15 0716 09/20/15 1154 09/20/15 1328  BP: 113/64   151/82  Pulse: 83   75  Temp: 98.1 F (36.7 C)   97.7 F (36.5 C)  TempSrc: Oral   Oral  Resp: 20   20  Height:      Weight: 68.312 kg (150 lb 9.6 oz)     SpO2: 100% 97% 98% 98%    Intake/Output Summary (Last 24 hours) at 09/20/15 1454 Last data filed at 09/20/15 1107  Gross per 24 hour  Intake    843 ml  Output      0 ml  Net    843 ml   Filed Weights   09/19/15 2219 09/20/15 0546  Weight: 67.132 kg (148 lb) 68.312 kg (150 lb 9.6 oz)    Exam:   General:  Awake, in nad  Cardiovascular: regular, s1, s2  Respiratory: slightly increased resp effort, wheezing throughout  Abdomen: soft, nondistended  Musculoskeletal:  perfused, no clubbing   Data Reviewed: Basic Metabolic Panel:  Recent Labs Lab 09/17/15 2330 09/18/15 0818 09/19/15 2240  NA 143 142 143  K 4.0 4.2 3.9  CL 108 107 106  CO2 26 25  --   GLUCOSE 99 174* 118*  BUN 8 8 20   CREATININE 0.56* 0.68 0.80  CALCIUM 9.0 8.8*  --    Liver Function Tests: No results for input(s): AST, ALT, ALKPHOS, BILITOT, PROT, ALBUMIN in the last 168 hours. No results for input(s): LIPASE, AMYLASE in the last 168 hours. No results for input(s): AMMONIA in the last 168 hours. CBC:  Recent Labs Lab 09/17/15 2330 09/18/15 0818 09/19/15 2225 09/19/15 2240  WBC 9.1 8.7 19.7*  --   NEUTROABS 5.1  --  16.5*  --   HGB 12.7* 12.4* 13.1 14.3  HCT 39.6 39.2 41.3 42.0  MCV 90.0 90.3 91.4  --   PLT 249 257 261  --    Cardiac Enzymes:  Recent Labs Lab 09/18/15 0253 09/18/15 0818 09/18/15 1436 09/20/15 0236 09/20/15 0817  TROPONINI <0.03 <0.03 <0.03 <0.03 <0.03   BNP (last 3 results)  Recent Labs  01/17/15 0029 07/03/15 0345 08/02/15 1932  BNP 42.0 28.0 42.0    ProBNP (last 3 results) No results for input(s): PROBNP in the last 8760 hours.  CBG: No results for input(s):  GLUCAP in the last 168 hours.  No results found for this or any previous visit (from the past 240 hour(s)).   Studies: Dg Chest Port 1 View  09/19/2015  CLINICAL DATA:  Initial evaluation for shortness of breath, wheezing, nonproductive cough since yesterday. EXAM: PORTABLE CHEST 1 VIEW COMPARISON:  Prior radiograph from 09/19/2015. FINDINGS: Patient is rotated to the right. The cardiac and mediastinal silhouettes are stable in size and contour, and remain within normal limits. The lungs are hyperinflated with attenuation the pulmonary markings, consistent with COPD, similar to prior. Asymmetric opacity at the peripheral right upper lobe favored to related to patient rotation. No airspace consolidation, pleural effusion, or pulmonary edema is identified. There is no  pneumothorax. No acute osseous abnormality identified. IMPRESSION: 1. Emphysema.  No other active cardiopulmonary disease. 2. Asymmetric opacity at the peripheral right upper lobe, favored to be related to patient positioning/technique. Electronically Signed   By: Kenneth Merritt M.D.   On: 09/19/2015 22:47    Scheduled Meds: . albuterol  2.5 mg Nebulization Q6H  . antiseptic oral rinse  7 mL Mouth Rinse BID  . apixaban  5 mg Oral BID  . cefUROXime (ZINACEF)  IV  750 mg Intravenous Q8H  . methylPREDNISolone (SOLU-MEDROL) injection  40 mg Intravenous Q6H  . metoprolol  50 mg Oral BID  . pantoprazole  40 mg Oral Daily  . simvastatin  40 mg Oral q1800  . sodium chloride  3 mL Intravenous Q12H   Continuous Infusions:   Principal Problem:   COPD with exacerbation (HCC) Active Problems:   Dyslipidemia   Tobacco user   CAD (coronary artery disease), native coronary artery   PAF (paroxysmal atrial fibrillation) (HCC)   HX: anticoagulation   On prednisone therapy   COPD exacerbation ([place])    Kenneth Merritt, [facility] Pager [phone]. If 7PM-7AM, please contact night-coverage at [email or web address], password [facility] 09/20/2015, 2:54 PM  LOS: 0 days

## 2015-09-21 DIAGNOSIS — Z720 Tobacco use: Secondary | ICD-10-CM

## 2015-09-21 DIAGNOSIS — J441 Chronic obstructive pulmonary disease with (acute) exacerbation: Principal | ICD-10-CM

## 2015-09-21 DIAGNOSIS — E785 Hyperlipidemia, unspecified: Secondary | ICD-10-CM

## 2015-09-21 DIAGNOSIS — Z7901 Long term (current) use of anticoagulants: Secondary | ICD-10-CM

## 2015-09-21 DIAGNOSIS — I480 Paroxysmal atrial fibrillation: Secondary | ICD-10-CM

## 2015-09-21 MED ORDER — IPRATROPIUM-ALBUTEROL 0.5-2.5 (3) MG/3ML IN SOLN
3.0000 mL | RESPIRATORY_TRACT | Status: DC
  Administered 2015-09-21 – 2015-09-23 (×10): 3 mL via RESPIRATORY_TRACT
  Filled 2015-09-21 (×12): qty 3

## 2015-09-21 MED ORDER — METHYLPREDNISOLONE SODIUM SUCC 40 MG IJ SOLR
40.0000 mg | Freq: Two times a day (BID) | INTRAMUSCULAR | Status: DC
  Administered 2015-09-21 (×2): 40 mg via INTRAVENOUS
  Filled 2015-09-21 (×2): qty 1

## 2015-09-21 NOTE — Progress Notes (Signed)
[facility] PROGRESS NOTE  Kenneth Merritt [id number] DOB: Aug 26, 1951 DOA: 09/19/2015 PCP: Neale Burly, MD  HPI/Brief narrative 64yo recently admitted for COPD exacerbation, returns for acute flare  Assessment/Plan: COPD exacerbation - Recent chest x-ray with no evidence of pneumonia -Patient was continued on scheduled nebs and IV steroids -Pt continues with B wheezing throughout -Empiric cefuroxime ordered, however, pt has been refusing -Cont to wean O2 as tolerated -Trial to wean solumedrol from q6hrs to q12hrs  Paroxysmal atrial fibrillation - Continue metoprolol for rate control - Continue Eliquis for anticoagulation.  GERD - Continued with PPI  Hyperlipidemia - Continued with statin  Hypertension - Continued with metoprolol - BP stable  DVT Prophylaxis Patient is continued on Eliquis while admitted  Code Status: Full Family Communication: Pt in room Disposition Plan: Pending   Consultants:    Procedures:    Antibiotics: Anti-infectives    Start     Dose/Rate Route Frequency Ordered Stop   09/20/15 0300  cefUROXime (ZINACEF) 750 mg in dextrose 5 % 50 mL IVPB     750 mg 100 mL/hr over 30 Minutes Intravenous Every 8 hours 09/20/15 0213        HPI/Subjective: Reports feeling better today  Objective: Filed Vitals:   09/21/15 0724 09/21/15 1003 09/21/15 1237 09/21/15 1511  BP:  115/77  134/88  Pulse:  82  71  Temp:    97.7 F (36.5 C)  TempSrc:    Oral  Resp:    20  Height:      Weight:      SpO2: 98%  98% 100%    Intake/Output Summary (Last 24 hours) at 09/21/15 1721 Last data filed at 09/21/15 1003  Gross per 24 hour  Intake    243 ml  Output      0 ml  Net    243 ml   Filed Weights   09/19/15 2219 09/20/15 0546  Weight: 67.132 kg (148 lb) 68.312 kg (150 lb 9.6 oz)    Exam:   General:  Awake, in nad  Cardiovascular: regular, s1, s2  Respiratory: normal resp effort, decreased breath sounds B with end-expiratory  wheezig  Abdomen: soft, nondistended  Musculoskeletal: perfused, no clubbing   Data Reviewed: Basic Metabolic Panel:  Recent Labs Lab 09/17/15 2330 09/18/15 0818 09/19/15 2240  NA 143 142 143  K 4.0 4.2 3.9  CL 108 107 106  CO2 26 25  --   GLUCOSE 99 174* 118*  BUN 8 8 20   CREATININE 0.56* 0.68 0.80  CALCIUM 9.0 8.8*  --    Liver Function Tests: No results for input(s): AST, ALT, ALKPHOS, BILITOT, PROT, ALBUMIN in the last 168 hours. No results for input(s): LIPASE, AMYLASE in the last 168 hours. No results for input(s): AMMONIA in the last 168 hours. CBC:  Recent Labs Lab 09/17/15 2330 09/18/15 0818 09/19/15 2225 09/19/15 2240  WBC 9.1 8.7 19.7*  --   NEUTROABS 5.1  --  16.5*  --   HGB 12.7* 12.4* 13.1 14.3  HCT 39.6 39.2 41.3 42.0  MCV 90.0 90.3 91.4  --   PLT 249 257 261  --    Cardiac Enzymes:  Recent Labs Lab 09/18/15 0818 09/18/15 1436 09/20/15 0236 09/20/15 0817 09/20/15 1458  TROPONINI <0.03 <0.03 <0.03 <0.03 <0.03   BNP (last 3 results)  Recent Labs  01/17/15 0029 07/03/15 0345 08/02/15 1932  BNP 42.0 28.0 42.0    ProBNP (last 3 results) No results for input(s): PROBNP in the  last 8760 hours.  CBG: No results for input(s): GLUCAP in the last 168 hours.  No results found for this or any previous visit (from the past 240 hour(s)).   Studies: Dg Chest Port 1 View  09/19/2015  CLINICAL DATA:  Initial evaluation for shortness of breath, wheezing, nonproductive cough since yesterday. EXAM: PORTABLE CHEST 1 VIEW COMPARISON:  Prior radiograph from 09/19/2015. FINDINGS: Patient is rotated to the right. The cardiac and mediastinal silhouettes are stable in size and contour, and remain within normal limits. The lungs are hyperinflated with attenuation the pulmonary markings, consistent with COPD, similar to prior. Asymmetric opacity at the peripheral right upper lobe favored to related to patient rotation. No airspace consolidation, pleural  effusion, or pulmonary edema is identified. There is no pneumothorax. No acute osseous abnormality identified. IMPRESSION: 1. Emphysema.  No other active cardiopulmonary disease. 2. Asymmetric opacity at the peripheral right upper lobe, favored to be related to patient positioning/technique. Electronically Signed   By: Jeannine Boga M.D.   On: 09/19/2015 22:47    Scheduled Meds: . antiseptic oral rinse  7 mL Mouth Rinse BID  . apixaban  5 mg Oral BID  . cefUROXime (ZINACEF)  IV  750 mg Intravenous Q8H  . gabapentin  300 mg Oral TID  . ipratropium-albuterol  3 mL Nebulization Q4H  . methylPREDNISolone (SOLU-MEDROL) injection  40 mg Intravenous Q12H  . metoprolol  50 mg Oral BID  . pantoprazole  40 mg Oral Daily  . simvastatin  40 mg Oral q1800  . sodium chloride  3 mL Intravenous Q12H   Continuous Infusions:   Principal Problem:   COPD with exacerbation (HCC) Active Problems:   Dyslipidemia   Tobacco user   CAD (coronary artery disease), native coronary artery   PAF (paroxysmal atrial fibrillation) (HCC)   HX: anticoagulation   On prednisone therapy   COPD exacerbation ([place])    Kenneth Merritt, [facility] Pager [phone]. If 7PM-7AM, please contact night-coverage at [email or web address], password [facility] 09/21/2015, 5:21 PM  LOS: 1 day

## 2015-09-21 NOTE — Progress Notes (Signed)
Patient refused Zinacef doses last night and 1100 dose scheduled today.  Dr. Wyline Copas informed.  Patient reports antibiotic causes difficulty swallowing.

## 2015-09-21 NOTE — Progress Notes (Signed)
Patient wheezing.  Respiratory called and requested prn neb for wheezing.

## 2015-09-21 NOTE — Progress Notes (Signed)
No wheezing after prn neb given per patients request.

## 2015-09-21 NOTE — Care Management Note (Signed)
Case Management Note  Patient Details  Name: HINSON VLASIC MRN: [id number] Date of Birth: 15-Feb-1951  Subjective/Objective:                    Action/Plan:   Expected Discharge Date:                  Expected Discharge Plan:  Home/Self Care  In-House Referral:  NA  Discharge planning Services  CM Consult  Post Acute Care Choice:  NA Choice offered to:  NA  DME Arranged:    DME Agency:     HH Arranged:    HH Agency:     Status of Service:  Completed, signed off  Medicare Important Message Given:  Yes Date Medicare IM Given:    Medicare IM give by:    Date Additional Medicare IM Given:    Additional Medicare Important Message give by:     If discussed at [place] of Stay Meetings, dates discussed:    Additional Comments: Anticipate discharge within 48 hours. No CM needs noted. Pt has home O2 and neb machine for home use. Pt refuses any home health services at this time. Christinia Gully Charenton, RN 09/21/2015, 8:44 AM

## 2015-09-21 NOTE — Care Management Important Message (Signed)
Important Message  Patient Details  Name: Kenneth Merritt MRN: [id number] Date of Birth: 1951/08/11   Medicare Important Message Given:  Yes    Joylene Draft, RN 09/21/2015, 8:43 AM

## 2015-09-22 LAB — BASIC METABOLIC PANEL
ANION GAP: 5 (ref 5–15)
BUN: 18 mg/dL (ref 6–20)
CHLORIDE: 106 mmol/L (ref 101–111)
CO2: 30 mmol/L (ref 22–32)
Calcium: 8.3 mg/dL — ABNORMAL LOW (ref 8.9–10.3)
Creatinine, Ser: 0.58 mg/dL — ABNORMAL LOW (ref 0.61–1.24)
GFR calc non Af Amer: >60 mL/min (ref >60)
Glucose, Bld: 160 mg/dL — ABNORMAL HIGH (ref 65–99)
Potassium: 4.4 mmol/L (ref 3.5–5.1)
Sodium: 141 mmol/L (ref 135–145)

## 2015-09-22 LAB — CBC
HEMATOCRIT: 38.3 % — AB (ref 39.0–52.0)
HEMOGLOBIN: 12 g/dL — AB (ref 13.0–17.0)
MCH: 28.3 pg (ref 26.0–34.0)
MCHC: 31.3 g/dL (ref 30.0–36.0)
MCV: 90.3 fL (ref 78.0–100.0)
Platelets: 237 10*3/uL (ref 150–400)
RBC: 4.24 MIL/uL (ref 4.22–5.81)
RDW: 14.9 % (ref 11.5–15.5)
WBC: 12.9 10*3/uL — AB (ref 4.0–10.5)

## 2015-09-22 MED ORDER — METHYLPREDNISOLONE SODIUM SUCC 125 MG IJ SOLR
60.0000 mg | INTRAMUSCULAR | Status: DC
  Administered 2015-09-22 – 2015-09-23 (×2): 60 mg via INTRAVENOUS
  Filled 2015-09-22 (×2): qty 2

## 2015-09-22 MED ORDER — BENZONATATE 100 MG PO CAPS: 100.0000 mg | ORAL_CAPSULE | Freq: Three times a day (TID) | ORAL | Status: DC | PRN

## 2015-09-22 NOTE — Progress Notes (Signed)
Requesting prn neb.  Respiratory called, will come for prn treatment.  Will continue to monitor.

## 2015-09-22 NOTE — Progress Notes (Signed)
[facility] PROGRESS NOTE  Kenneth Merritt [id number] DOB: May 17, 1951 DOA: 09/19/2015 PCP: Neale Burly, MD  HPI/Brief narrative 64yo recently admitted for COPD exacerbation, returns for acute flare  Assessment/Plan: COPD exacerbation - Recent chest x-ray with no evidence of pneumonia -Patient was continued on scheduled nebs and IV steroids -Pt continues with B wheezing throughout -Empiric cefuroxime ordered, however, pt has been refusing, believing abx is causing phlem. Strongly advised pt to take abx -Cont to wean O2 as tolerated -Trial to wean solumedrol. Will decrease from 40mg  IV BID to 60mg  IV daily  Paroxysmal atrial fibrillation - CHADS-VASc of 2 - Continue metoprolol for rate control - Continue Eliquis for anticoagulation.  GERD - Continued with PPI  Hyperlipidemia - Continued with statin  Hypertension - Continued with metoprolol - BP stable  DVT Prophylaxis Patient is continued on Eliquis while admitted  Code Status: Full Family Communication: Pt in room Disposition Plan: Pending   Consultants:    Procedures:    Antibiotics: Anti-infectives    Start     Dose/Rate Route Frequency Ordered Stop   09/20/15 0300  cefUROXime (ZINACEF) 750 mg in dextrose 5 % 50 mL IVPB     750 mg 100 mL/hr over 30 Minutes Intravenous Every 8 hours 09/20/15 0213        HPI/Subjective: Still mildly sob, but pt does report feeling better  Objective: Filed Vitals:   09/21/15 2143 09/22/15 0615 09/22/15 0741 09/22/15 0916  BP: 145/86 122/69  152/79  Pulse: 74 55  77  Temp: 97.8 F (36.6 C) 97.6 F (36.4 C)  97.8 F (36.6 C)  TempSrc: Oral Oral  Oral  Resp: 20 20  20   Height:      Weight:      SpO2: 100% 100% 98% 98%    Intake/Output Summary (Last 24 hours) at 09/22/15 1024 Last data filed at 09/22/15 0302  Gross per 24 hour  Intake      0 ml  Output    100 ml  Net   -100 ml   Filed Weights   09/19/15 2219 09/20/15 0546  Weight: 67.132 kg (148  lb) 68.312 kg (150 lb 9.6 oz)    Exam:   General:  Awake, laying in bed, in nad  Cardiovascular: regular, s1, s2  Respiratory: normal resp effort, decreased breath sounds B with improved end-expiratory wheezig  Abdomen: soft, nondistended  Musculoskeletal: perfused, no clubbing, no cyanosis  Data Reviewed: Basic Metabolic Panel:  Recent Labs Lab 09/17/15 2330 09/18/15 0818 09/19/15 2240 09/22/15 0544  NA 143 142 143 141  K 4.0 4.2 3.9 4.4  CL 108 107 106 106  CO2 26 25  --  30  GLUCOSE 99 174* 118* 160*  BUN 8 8 20 18   CREATININE 0.56* 0.68 0.80 0.58*  CALCIUM 9.0 8.8*  --  8.3*   Liver Function Tests: No results for input(s): AST, ALT, ALKPHOS, BILITOT, PROT, ALBUMIN in the last 168 hours. No results for input(s): LIPASE, AMYLASE in the last 168 hours. No results for input(s): AMMONIA in the last 168 hours. CBC:  Recent Labs Lab 09/17/15 2330 09/18/15 0818 09/19/15 2225 09/19/15 2240 09/22/15 0544  WBC 9.1 8.7 19.7*  --  12.9*  NEUTROABS 5.1  --  16.5*  --   --   HGB 12.7* 12.4* 13.1 14.3 12.0*  HCT 39.6 39.2 41.3 42.0 38.3*  MCV 90.0 90.3 91.4  --  90.3  PLT 249 257 261  --  237   Cardiac Enzymes:  Recent Labs Lab 09/18/15 0818 09/18/15 1436 09/20/15 0236 09/20/15 0817 09/20/15 1458  TROPONINI <0.03 <0.03 <0.03 <0.03 <0.03   BNP (last 3 results)  Recent Labs  01/17/15 0029 07/03/15 0345 08/02/15 1932  BNP 42.0 28.0 42.0    ProBNP (last 3 results) No results for input(s): PROBNP in the last 8760 hours.  CBG: No results for input(s): GLUCAP in the last 168 hours.  No results found for this or any previous visit (from the past 240 hour(s)).   Studies: No results found.  Scheduled Meds: . antiseptic oral rinse  7 mL Mouth Rinse BID  . apixaban  5 mg Oral BID  . cefUROXime (ZINACEF)  IV  750 mg Intravenous Q8H  . gabapentin  300 mg Oral TID  . ipratropium-albuterol  3 mL Nebulization Q4H  . methylPREDNISolone (SOLU-MEDROL)  injection  60 mg Intravenous Q24H  . metoprolol  50 mg Oral BID  . pantoprazole  40 mg Oral Daily  . simvastatin  40 mg Oral q1800  . sodium chloride  3 mL Intravenous Q12H   Continuous Infusions:   Principal Problem:   COPD with exacerbation (HCC) Active Problems:   Dyslipidemia   Tobacco user   CAD (coronary artery disease), native coronary artery   PAF (paroxysmal atrial fibrillation) (HCC)   HX: anticoagulation   On prednisone therapy   COPD exacerbation ([place])    Kenneth Merritt, [place] [facility] Pager [phone]. If 7PM-7AM, please contact night-coverage at [email or web address], password [facility] 09/22/2015, 10:24 AM  LOS: 2 days

## 2015-09-23 MED ORDER — BENZONATATE 100 MG PO CAPS: 100.0000 mg | ORAL_CAPSULE | Freq: Three times a day (TID) | ORAL | Status: DC | PRN

## 2015-09-23 NOTE — Progress Notes (Signed)
IV access removed, central telemetry notified of patient's discharge, telemetry removed.  Discharge instructions reviewed with patient, questions answered, scipt given.  No respiratory distress on room air.  Waiting ride.

## 2015-09-23 NOTE — Progress Notes (Signed)
Left via wheelchair for discharge in care of family.  Stable at discharge.

## 2015-09-23 NOTE — Discharge Summary (Signed)
Physician Discharge Summary  Kenneth Merritt [id number] DOB: Mar 13, 1951 DOA: 09/19/2015  PCP: Neale Burly, MD  Admit date: 09/19/2015 Discharge date: 09/23/2015  Time spent: 20 minutes  Recommendations for Outpatient Follow-up:  1. Follow up with PCP in 1-2 weeks   Discharge Diagnoses:  Principal Problem:   COPD with exacerbation ([place]) Active Problems:   Dyslipidemia   Tobacco user   CAD (coronary artery disease), native coronary artery   PAF (paroxysmal atrial fibrillation) (HCC)   HX: anticoagulation   On prednisone therapy   COPD exacerbation ([place])   Discharge Condition: Improved  Diet recommendation: Heart healthy  Filed Weights   09/19/15 2219 09/20/15 0546  Weight: 67.132 kg (148 lb) 68.312 kg (150 lb 9.6 oz)    History of present illness:  Please review dictated H and P from 11/10 for details. Briefly, 64yo recently admitted for COPD exacerbation, returns for acute flare.  Hospital Course:  COPD exacerbation - Recent chest x-ray with no evidence of pneumonia -Patient was continued on scheduled nebs and IV steroids -Pt B wheezing resolved during this admission -Empiric cefuroxime was ordered, however, pt has been refusing, believing abx is causing phlem. Had strongly advised pt to take abx -Weaned O2 back to room air. Pt reports using home O2 at night -Weaned solumedrol to once daily. Pt to complete steroid taper with prednisone effective 11/14, prescription already given from last admit which patient never filled  Paroxysmal atrial fibrillation - CHADS-VASc of 2 - Continued metoprolol for rate control - Continue Eliquis for anticoagulation.  GERD - Continued with PPI  Hyperlipidemia - Continued with statin  Hypertension - Continued with metoprolol - BP stable  DVT Prophylaxis Patient is continued on Eliquis while admitted  Discharge Exam: Filed Vitals:   09/23/15 0621 09/23/15 0710 09/23/15 1054 09/23/15 1242  BP: 118/74  101/71   Pulse: 77   97   Temp: 98.4 F (36.9 C)     TempSrc: Oral     Resp: 20     Height:      Weight:      SpO2: 95% 99%  99%    General: Awake, in nad Cardiovascular: regular, s1, s2 Respiratory: normal resp effort, no wheezing  Discharge Instructions     Medication List    TAKE these medications        albuterol (2.5 MG/3ML) 0.083% nebulizer solution  Commonly known as:  PROVENTIL  Take 3 mLs (2.5 mg total) by nebulization every 4 (four) hours as needed for wheezing or shortness of breath.     albuterol 108 (90 BASE) MCG/ACT inhaler  Commonly known as:  PROVENTIL HFA;VENTOLIN HFA  Inhale 2 puffs into the lungs every 4 (four) hours as needed for wheezing or shortness of breath.     apixaban 5 MG Tabs tablet  Commonly known as:  ELIQUIS  Take 1 tablet (5 mg total) by mouth 2 (two) times daily.     benzonatate 100 MG capsule  Commonly known as:  TESSALON  Take 1 capsule (100 mg total) by mouth 3 (three) times daily as needed for cough.     gabapentin 300 MG capsule  Commonly known as:  NEURONTIN  Take 600 mg by mouth 3 (three) times daily.     HYDROcodone-acetaminophen 5-325 MG tablet  Commonly known as:  NORCO/VICODIN  Take 1 tablet by mouth every 6 (six) hours as needed for moderate pain.     ipratropium 0.02 % nebulizer solution  Commonly known as:  ATROVENT  Take  0.5 mg by nebulization every 4 (four) hours as needed for wheezing or shortness of breath.     lidocaine 5 % ointment  Commonly known as:  XYLOCAINE  Apply 1 application topically daily as needed for mild pain or moderate pain.     metoprolol 50 MG tablet  Commonly known as:  LOPRESSOR  Take 50 mg by mouth 2 (two) times daily.     nitroGLYCERIN 0.4 MG SL tablet  Commonly known as:  NITROSTAT  Place 1 tablet (0.4 mg total) under the tongue every 5 (five) minutes x 3 doses as needed for chest pain.     omeprazole 20 MG capsule  Commonly known as:  PRILOSEC  Take 1 capsule by mouth every morning.      predniSONE 5 MG tablet  Commonly known as:  DELTASONE  Take 1 tablet (5 mg total) by mouth daily with breakfast.     simvastatin 40 MG tablet  Commonly known as:  ZOCOR  TAKE 1 TABLET (40 MG TOTAL) BY MOUTH EVERY EVENING.       Allergies  Allergen Reactions  . Levaquin [Levofloxacin In D5w]     Pt states it caused dry mouth and rectal bleeding.    Follow-up Information    Follow up with Neale Burly, MD. Schedule an appointment as soon as possible for a visit in 1 week.   Specialty:  Internal Medicine   Why:  Hospital follow up   Contact information:   [place] [id number] [phone]        The results of significant diagnostics from this hospitalization (including imaging, microbiology, ancillary and laboratory) are listed below for reference.    Significant Diagnostic Studies: Dg Chest Port 1 View  09/19/2015  CLINICAL DATA:  Initial evaluation for shortness of breath, wheezing, nonproductive cough since yesterday. EXAM: PORTABLE CHEST 1 VIEW COMPARISON:  Prior radiograph from 09/19/2015. FINDINGS: Patient is rotated to the right. The cardiac and mediastinal silhouettes are stable in size and contour, and remain within normal limits. The lungs are hyperinflated with attenuation the pulmonary markings, consistent with COPD, similar to prior. Asymmetric opacity at the peripheral right upper lobe favored to related to patient rotation. No airspace consolidation, pleural effusion, or pulmonary edema is identified. There is no pneumothorax. No acute osseous abnormality identified. IMPRESSION: 1. Emphysema.  No other active cardiopulmonary disease. 2. Asymmetric opacity at the peripheral right upper lobe, favored to be related to patient positioning/technique. Electronically Signed   By: Jeannine Boga M.D.   On: 09/19/2015 22:47   Dg Chest Portable 1 View  09/17/2015  CLINICAL DATA:  Shortness of breath and wheezing tonight. EXAM: PORTABLE CHEST 1 VIEW  COMPARISON:  08/07/2015. FINDINGS: The cardiac silhouette, mediastinal and hilar contours are within normal limits and stable. There is mild tortuosity and calcification of the thoracic aorta. The lungs demonstrate stable emphysematous changes and pulmonary scarring. No definite acute overlying pulmonary process. No pleural effusions. IMPRESSION: Emphysematous changes and apical scarring but no definite acute overlying pulmonary process. Electronically Signed   By: Marijo Sanes M.D.   On: 09/17/2015 23:57    Microbiology: No results found for this or any previous visit (from the past 240 hour(s)).   Labs: Basic Metabolic Panel:  Recent Labs Lab 09/17/15 2330 09/18/15 0818 09/19/15 2240 09/22/15 0544  NA 143 142 143 141  K 4.0 4.2 3.9 4.4  CL 108 107 106 106  CO2 26 25  --  30  GLUCOSE  99 174* 118* 160*  BUN 8 8 20 18   CREATININE 0.56* 0.68 0.80 0.58*  CALCIUM 9.0 8.8*  --  8.3*   Liver Function Tests: No results for input(s): AST, ALT, ALKPHOS, BILITOT, PROT, ALBUMIN in the last 168 hours. No results for input(s): LIPASE, AMYLASE in the last 168 hours. No results for input(s): AMMONIA in the last 168 hours. CBC:  Recent Labs Lab 09/17/15 2330 09/18/15 0818 09/19/15 2225 09/19/15 2240 09/22/15 0544  WBC 9.1 8.7 19.7*  --  12.9*  NEUTROABS 5.1  --  16.5*  --   --   HGB 12.7* 12.4* 13.1 14.3 12.0*  HCT 39.6 39.2 41.3 42.0 38.3*  MCV 90.0 90.3 91.4  --  90.3  PLT 249 257 261  --  237   Cardiac Enzymes:  Recent Labs Lab 09/18/15 0818 09/18/15 1436 09/20/15 0236 09/20/15 0817 09/20/15 1458  TROPONINI <0.03 <0.03 <0.03 <0.03 <0.03   BNP: BNP (last 3 results)  Recent Labs  01/17/15 0029 07/03/15 0345 08/02/15 1932  BNP 42.0 28.0 42.0    ProBNP (last 3 results) No results for input(s): PROBNP in the last 8760 hours.  CBG: No results for input(s): GLUCAP in the last 168 hours.   Signed:  Tashayla Therien, Orpah Melter  [facility] 09/23/2015, 12:53 PM

## 2015-10-02 ENCOUNTER — Encounter (HOSPITAL_COMMUNITY): Payer: Self-pay | Admitting: Emergency Medicine

## 2015-10-02 ENCOUNTER — Emergency Department (HOSPITAL_COMMUNITY): Payer: Medicare Other

## 2015-10-02 ENCOUNTER — Inpatient Hospital Stay (HOSPITAL_COMMUNITY)
Admission: EM | Admit: 2015-10-02 | Discharge: 2015-10-04 | DRG: 309 | Disposition: A | Discharge status: Home / Self Care | Payer: Medicare Other | Attending: Internal Medicine | Admitting: Internal Medicine

## 2015-10-02 DIAGNOSIS — Z8042 Family history of malignant neoplasm of prostate: Secondary | ICD-10-CM

## 2015-10-02 DIAGNOSIS — I4891 Unspecified atrial fibrillation: Principal | ICD-10-CM | POA: Diagnosis present

## 2015-10-02 DIAGNOSIS — Z7982 Long term (current) use of aspirin: Secondary | ICD-10-CM

## 2015-10-02 DIAGNOSIS — Z87891 Personal history of nicotine dependence: Secondary | ICD-10-CM

## 2015-10-02 DIAGNOSIS — K219 Gastro-esophageal reflux disease without esophagitis: Secondary | ICD-10-CM | POA: Diagnosis present

## 2015-10-02 DIAGNOSIS — Z8546 Personal history of malignant neoplasm of prostate: Secondary | ICD-10-CM

## 2015-10-02 DIAGNOSIS — R079 Chest pain, unspecified: Secondary | ICD-10-CM | POA: Diagnosis not present

## 2015-10-02 DIAGNOSIS — M1990 Unspecified osteoarthritis, unspecified site: Secondary | ICD-10-CM | POA: Diagnosis present

## 2015-10-02 DIAGNOSIS — J441 Chronic obstructive pulmonary disease with (acute) exacerbation: Secondary | ICD-10-CM | POA: Diagnosis not present

## 2015-10-02 DIAGNOSIS — Z923 Personal history of irradiation: Secondary | ICD-10-CM

## 2015-10-02 DIAGNOSIS — J449 Chronic obstructive pulmonary disease, unspecified: Secondary | ICD-10-CM | POA: Diagnosis present

## 2015-10-02 DIAGNOSIS — E785 Hyperlipidemia, unspecified: Secondary | ICD-10-CM | POA: Diagnosis present

## 2015-10-02 DIAGNOSIS — I2510 Atherosclerotic heart disease of native coronary artery without angina pectoris: Secondary | ICD-10-CM | POA: Diagnosis present

## 2015-10-02 DIAGNOSIS — Z7901 Long term (current) use of anticoagulants: Secondary | ICD-10-CM

## 2015-10-02 DIAGNOSIS — Z9981 Dependence on supplemental oxygen: Secondary | ICD-10-CM

## 2015-10-02 DIAGNOSIS — I739 Peripheral vascular disease, unspecified: Secondary | ICD-10-CM | POA: Diagnosis present

## 2015-10-02 DIAGNOSIS — I252 Old myocardial infarction: Secondary | ICD-10-CM

## 2015-10-02 DIAGNOSIS — I248 Other forms of acute ischemic heart disease: Secondary | ICD-10-CM | POA: Diagnosis present

## 2015-10-02 HISTORY — DX: Dependence on supplemental oxygen: Z99.81

## 2015-10-02 LAB — BASIC METABOLIC PANEL
ANION GAP: 9 (ref 5–15)
BUN: 11 mg/dL (ref 6–20)
CO2: 28 mmol/L (ref 22–32)
Calcium: 8.8 mg/dL — ABNORMAL LOW (ref 8.9–10.3)
Chloride: 107 mmol/L (ref 101–111)
Creatinine, Ser: 0.66 mg/dL (ref 0.61–1.24)
Glucose, Bld: 176 mg/dL — ABNORMAL HIGH (ref 65–99)
POTASSIUM: 4.3 mmol/L (ref 3.5–5.1)
SODIUM: 144 mmol/L (ref 135–145)

## 2015-10-02 LAB — CBC WITH DIFFERENTIAL/PLATELET
BASOS PCT: 0 %
Basophils Absolute: 0 10*3/uL (ref 0.0–0.1)
EOS ABS: 0 10*3/uL (ref 0.0–0.7)
Eosinophils Relative: 0 %
HCT: 44.2 % (ref 39.0–52.0)
HEMOGLOBIN: 14 g/dL (ref 13.0–17.0)
Lymphocytes Relative: 9 %
Lymphs Abs: 1.2 10*3/uL (ref 0.7–4.0)
MCH: 28.9 pg (ref 26.0–34.0)
MCHC: 31.7 g/dL (ref 30.0–36.0)
MCV: 91.1 fL (ref 78.0–100.0)
Monocytes Absolute: 1.3 10*3/uL — ABNORMAL HIGH (ref 0.1–1.0)
Monocytes Relative: 10 %
NEUTROS PCT: 81 %
Neutro Abs: 11.4 10*3/uL — ABNORMAL HIGH (ref 1.7–7.7)
PLATELETS: 159 10*3/uL (ref 150–400)
RBC: 4.85 MIL/uL (ref 4.22–5.81)
RDW: 15.7 % — ABNORMAL HIGH (ref 11.5–15.5)
WBC: 14 10*3/uL — AB (ref 4.0–10.5)

## 2015-10-02 LAB — TROPONIN I: TROPONIN I: 0.04 ng/mL — AB (ref <0.031)

## 2015-10-02 LAB — BRAIN NATRIURETIC PEPTIDE: B NATRIURETIC PEPTIDE 5: 80 pg/mL (ref 0.0–100.0)

## 2015-10-02 MED ORDER — METOPROLOL TARTRATE 50 MG PO TABS
50.0000 mg | ORAL_TABLET | Freq: Two times a day (BID) | ORAL | Status: DC
  Administered 2015-10-03 (×2): 50 mg via ORAL
  Filled 2015-10-02 (×2): qty 1

## 2015-10-02 MED ORDER — SODIUM CHLORIDE 0.9 % IV BOLUS (SEPSIS)
500.0000 mL | Freq: Once | INTRAVENOUS | Status: AC
  Administered 2015-10-02: 500 mL via INTRAVENOUS

## 2015-10-02 MED ORDER — ALBUTEROL SULFATE (2.5 MG/3ML) 0.083% IN NEBU: 2.5000 mg | INHALATION_SOLUTION | RESPIRATORY_TRACT | Status: DC | PRN

## 2015-10-02 MED ORDER — SODIUM CHLORIDE 0.9 % IV BOLUS (SEPSIS)
250.0000 mL | Freq: Once | INTRAVENOUS | Status: AC
  Administered 2015-10-02: 250 mL via INTRAVENOUS

## 2015-10-02 MED ORDER — SODIUM CHLORIDE 0.9 % IV SOLN
INTRAVENOUS | Status: DC
Start: 2015-10-02 — End: 2015-10-02
  Administered 2015-10-02: 22:00:00 via INTRAVENOUS

## 2015-10-02 MED ORDER — PANTOPRAZOLE SODIUM 40 MG PO TBEC
40.0000 mg | DELAYED_RELEASE_TABLET | Freq: Every day | ORAL | Status: DC
  Administered 2015-10-03: 40 mg via ORAL
  Filled 2015-10-02: qty 1

## 2015-10-02 MED ORDER — IPRATROPIUM-ALBUTEROL 0.5-2.5 (3) MG/3ML IN SOLN
3.0000 mL | Freq: Once | RESPIRATORY_TRACT | Status: AC
  Administered 2015-10-02: 3 mL via RESPIRATORY_TRACT
  Filled 2015-10-02: qty 3

## 2015-10-02 MED ORDER — MORPHINE SULFATE (PF) 2 MG/ML IV SOLN: 2.0000 mg | INTRAVENOUS | Status: DC | PRN

## 2015-10-02 MED ORDER — IPRATROPIUM-ALBUTEROL 0.5-2.5 (3) MG/3ML IN SOLN
3.0000 mL | RESPIRATORY_TRACT | Status: DC | PRN
  Administered 2015-10-03: 3 mL via RESPIRATORY_TRACT

## 2015-10-02 MED ORDER — SODIUM CHLORIDE 0.9 % IV SOLN
INTRAVENOUS | Status: AC
  Administered 2015-10-03: via INTRAVENOUS

## 2015-10-02 MED ORDER — SIMVASTATIN 20 MG PO TABS
40.0000 mg | ORAL_TABLET | Freq: Every day | ORAL | Status: DC
  Administered 2015-10-03: 40 mg via ORAL
  Filled 2015-10-02: qty 2

## 2015-10-02 MED ORDER — PREDNISONE 20 MG PO TABS
40.0000 mg | ORAL_TABLET | Freq: Every day | ORAL | Status: DC
  Administered 2015-10-03 – 2015-10-04 (×2): 40 mg via ORAL
  Filled 2015-10-02 (×3): qty 2

## 2015-10-02 MED ORDER — GABAPENTIN 300 MG PO CAPS
600.0000 mg | ORAL_CAPSULE | Freq: Three times a day (TID) | ORAL | Status: DC
  Administered 2015-10-03 (×4): 600 mg via ORAL
  Filled 2015-10-02 (×4): qty 2

## 2015-10-02 MED ORDER — LEVALBUTEROL HCL 0.63 MG/3ML IN NEBU
0.6300 mg | INHALATION_SOLUTION | Freq: Four times a day (QID) | RESPIRATORY_TRACT | Status: DC | PRN
Start: 2015-10-02 — End: 2015-10-04
  Administered 2015-10-03: 0.63 mg via RESPIRATORY_TRACT
  Filled 2015-10-02: qty 3

## 2015-10-02 MED ORDER — IPRATROPIUM BROMIDE 0.02 % IN SOLN
0.5000 mg | RESPIRATORY_TRACT | Status: DC | PRN
  Administered 2015-10-03: 0.5 mg via RESPIRATORY_TRACT
  Filled 2015-10-02: qty 2.5

## 2015-10-02 MED ORDER — APIXABAN 5 MG PO TABS
5.0000 mg | ORAL_TABLET | Freq: Two times a day (BID) | ORAL | Status: DC
  Administered 2015-10-03 (×2): 5 mg via ORAL
  Filled 2015-10-02 (×2): qty 1

## 2015-10-02 MED ORDER — DILTIAZEM HCL 25 MG/5ML IV SOLN
15.0000 mg | Freq: Once | INTRAVENOUS | Status: AC
  Administered 2015-10-02: 15 mg via INTRAVENOUS

## 2015-10-02 MED ORDER — ACETAMINOPHEN 325 MG PO TABS: 650.0000 mg | ORAL_TABLET | Freq: Four times a day (QID) | ORAL | Status: DC | PRN

## 2015-10-02 MED ORDER — DILTIAZEM HCL 100 MG IV SOLR
5.0000 mg/h | INTRAVENOUS | Status: DC
  Administered 2015-10-02 – 2015-10-03 (×2): 5 mg/h via INTRAVENOUS
  Filled 2015-10-02 (×2): qty 100

## 2015-10-02 MED ORDER — HYDROCODONE-ACETAMINOPHEN 5-325 MG PO TABS
1.0000 | ORAL_TABLET | Freq: Four times a day (QID) | ORAL | Status: DC | PRN
  Administered 2015-10-03 (×2): 1 via ORAL
  Filled 2015-10-02 (×2): qty 1

## 2015-10-02 MED ORDER — SODIUM CHLORIDE 0.9 % IV BOLUS (SEPSIS): 500.0000 mL | Freq: Once | INTRAVENOUS | Status: DC

## 2015-10-02 MED ORDER — DILTIAZEM LOAD VIA INFUSION
15.0000 mg | Freq: Once | INTRAVENOUS | Status: AC
  Administered 2015-10-02: 15 mg via INTRAVENOUS
  Filled 2015-10-02: qty 15

## 2015-10-02 MED ORDER — ASPIRIN EC 81 MG PO TBEC
81.0000 mg | DELAYED_RELEASE_TABLET | Freq: Once | ORAL | Status: AC
  Administered 2015-10-03: 81 mg via ORAL
  Filled 2015-10-02: qty 1

## 2015-10-02 MED ORDER — ACETAMINOPHEN 650 MG RE SUPP: 650.0000 mg | Freq: Four times a day (QID) | RECTAL | Status: DC | PRN

## 2015-10-02 NOTE — ED Provider Notes (Signed)
CSN: [id number]     Arrival date & time 10/02/15  2102 History   First MD Initiated Contact with Patient 10/02/15 2123     Chief Complaint  Patient presents with  . Shortness of Breath      HPI Pt was seen at 2125. Per pt, c/o sudden onset and persistence of constant chest "pain" that began approximately 1 hour PTA. Describes the CP as "heartburn." Has been associated with SOB and nausea. Pt states he was walking around his house when his symptoms began. Denies palpitations, no cough, no abd pain, no vomiting/diarrhea, no back pain.    Past Medical History  Diagnosis Date  . GERD (gastroesophageal reflux disease)   . COPD (chronic obstructive pulmonary disease) ([place])     severe by PFTs August 2010  . 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 Atrial fibrillation (HCC)     post-op a-fib. 3/11. post Aortobifem , /  Atrial fibrillation from nebulizer treatment  May, 2012, while hospitalized  . Renal artery stenosis ([place])      presumably corrected at time of aortobifem surgery March, 2011  . Tobacco user      stop smoking  . Pneumothorax      2011  before aortobifemoral surgery,  resolved  . Peripheral vascular disease, unspecified ([place])     bilateral intermittent claudication  . CAD (coronary artery disease), native coronary artery     catheterization 06/2009.Marland KitchenMarland Kitchen70% proximal LAD and 30% proximal RCA. normal LV function, no intervention planned prior to surgery for aorta. LV normal function  by catheter 8/10.   [name] Dyslipidemia   . DJD (degenerative joint disease)   . Ejection fraction     60%, echo, May, 2012, rapid atrial fib at the time  . Cancer [facility]) 2013    Prostate Radiation  . Myocardial infarction ([place]) 2011  . On home O2     2L N/C   Past Surgical History  Procedure Laterality Date  . Abdominal aortic aneurysm repair  01/11/2010  . Pr vein bypass graft,aorto-fem-pop    . Inguinal  hernia repair      RIGHT   Family History  Problem Relation Age of Onset  . Cancer Father     prostate  . Heart attack Father    Social History  Substance Use Topics  . Smoking status: Former Smoker -- 2.00 packs/day for 40 years    Types: Cigarettes    Quit date: 01/11/2010  . Smokeless tobacco: Never Used  . Alcohol Use: No    Review of Systems ROS: Statement: All systems negative except as marked or noted in the HPI; Constitutional: Negative for fever and chills. ; ; Eyes: Negative for eye pain, redness and discharge. ; ; ENMT: Negative for ear pain, hoarseness, nasal congestion, sinus pressure and sore throat. ; ; Cardiovascular: +CP, SOB. Negative for palpitations, diaphoresis, and peripheral edema. ; ; Respiratory: Negative for cough, wheezing and stridor. ; ; Gastrointestinal: +nausea. Negative for vomiting, diarrhea, abdominal pain, blood in stool, hematemesis, jaundice and rectal bleeding. . ; ; Genitourinary: Negative for dysuria, flank pain and hematuria. ; ; Musculoskeletal: Negative for back pain and neck pain. Negative for swelling and trauma.; ; Skin: Negative for pruritus, rash, abrasions, blisters, bruising and skin lesion.; ; Neuro: Negative for headache, lightheadedness and neck stiffness. Negative for weakness, altered level of consciousness , altered mental status, extremity  weakness, paresthesias, involuntary movement, seizure and syncope.      Allergies  Levaquin  Home Medications   Prior to Admission medications   Medication Sig Start Date End Date Taking? Authorizing Provider  albuterol (PROVENTIL HFA;VENTOLIN HFA) 108 (90 BASE) MCG/ACT inhaler Inhale 2 puffs into the lungs every 4 (four) hours as needed for wheezing or shortness of breath. 09/19/15  Yes Donne [name], MD  albuterol (PROVENTIL) (2.5 MG/3ML) 0.083% nebulizer solution Take 3 mLs (2.5 mg total) by nebulization every 4 (four) hours as needed for wheezing or shortness of breath. 03/15/15  Yes [name], MD  apixaban (ELIQUIS) 5 MG TABS tablet Take 1 tablet (5 mg total) by mouth 2 (two) times daily. 06/20/15  Yes [name], MD  aspirin EC 81 MG tablet Take 81 mg by mouth once.   Yes Historical Provider, MD  gabapentin (NEURONTIN) 300 MG capsule Take 600 mg by mouth 3 (three) times daily.  02/13/14  Yes Historical Provider, MD  HYDROcodone-acetaminophen (NORCO/VICODIN) 5-325 MG per tablet Take 1 tablet by mouth every 6 (six) hours as needed for moderate pain.   Yes Historical Provider, MD  ipratropium (ATROVENT) 0.02 % nebulizer solution Take 0.5 mg by nebulization every 4 (four) hours as needed for wheezing or shortness of breath.   Yes Historical Provider, MD  metoprolol (LOPRESSOR) 50 MG tablet Take 50 mg by mouth 2 (two) times daily. 12/27/13  Yes Historical Provider, MD  omeprazole (PRILOSEC) 20 MG capsule Take 1 capsule by mouth every morning.  04/21/11  Yes Historical Provider, MD  OVER THE COUNTER MEDICATION Apply 1 application topically daily. [place] GEL: for pain   Yes Historical Provider, MD  predniSONE (DELTASONE) 10 MG tablet TAKE 6 TABS DAILY X 4 DAYS, 4 DAILY X 4 DAYS, 2 DAILY X 4 DAYS, THEN RESUME 10MG  DAILY 09/25/15  Yes Historical Provider, MD  predniSONE (DELTASONE) 5 MG tablet Take 1 tablet (5 mg total) by mouth daily with breakfast. 09/19/15  Yes Donne [name], MD  simvastatin (ZOCOR) 40 MG tablet TAKE 1 TABLET (40 MG TOTAL) BY MOUTH EVERY EVENING. 08/29/15  Yes [name], MD  benzonatate (TESSALON) 100 MG capsule Take 1 capsule (100 mg total) by mouth 3 (three) times daily as needed for cough. Patient not taking: Reported on 10/02/2015 09/23/15   Donne [name], MD  lidocaine (XYLOCAINE) 5 % ointment Apply 1 application topically daily as needed for mild pain or moderate pain.  07/23/15   Historical Provider, MD  nitroGLYCERIN (NITROSTAT) 0.4 MG SL tablet Place 1 tablet (0.4 mg total) under the tongue every 5 (five) minutes x 3 doses as needed for chest pain. 06/11/15    [name], MD   BP 82/61 mmHg  Pulse 89  Temp(Src) 97.7 F (36.5 C) (Oral)  Resp 17  Ht 5\' 4"  (1.626 m)  Wt 140 lb (63.504 kg)  BMI 24.02 kg/m2  SpO2 100% Physical Exam  2130: Physical examination:  Nursing notes reviewed; Vital signs and O2 SAT reviewed;  Constitutional: Well developed, Well nourished, Well hydrated, In no acute distress; Head:  Normocephalic, atraumatic; Eyes: EOMI, PERRL, No scleral icterus; ENMT: Mouth and pharynx normal, Mucous membranes moist; Neck: Supple, Full range of motion, No lymphadenopathy; Cardiovascular: Tachycardic rate and irregular irregular rhythm, No gallop; Respiratory: Breath sounds clear & equal bilaterally, No wheezes.  Speaking full sentences with ease, Normal respiratory effort/excursion; Chest: Nontender, Movement normal; Abdomen: Soft, Nontender, Nondistended, Normal bowel sounds; Genitourinary: No CVA tenderness; Extremities:  Pulses normal, No tenderness, No edema, No calf edema or asymmetry.; Neuro: AA&Ox3, Major CN grossly intact.  Speech clear. No gross focal motor or sensory deficits in extremities.; Skin: Color normal, Warm, Dry.   ED Course  Procedures (including critical care time) Labs Review   Imaging Review  I have personally reviewed and evaluated these images and lab results as part of my medical decision-making.   EKG Interpretation   Date/Time:  Tuesday October 02 2015 21:17:47 EST Ventricular Rate:  159 PR Interval:    QRS Duration: 97 QT Interval:  306 QTC Calculation: 498 R Axis:   43 Text Interpretation:  Atrial fibrillation with rapid V-rate Low voltage,  precordial leads Abnormal R-wave progression, early transition  Repolarization abnormality, prob rate related Baseline wander Artifact  When compared with ECG of 09/19/2015 Atrial fibrillation has replaced  Normal sinus rhythm Confirmed by [facility]  MD, Nunzio Cory [phone]) on  10/02/2015 10:08:46 PM      MDM  MDM Reviewed: previous chart, nursing note  and vitals Reviewed previous: labs and ECG Interpretation: labs, ECG and x-ray Total time providing critical care: 30-74 minutes. This excludes time spent performing separately reportable procedures and services. Consults: admitting MD     CRITICAL CARE Performed by: Alfonzo Feller Total critical care time: 35 minutes Critical care time was exclusive of separately billable procedures and treating other patients. Critical care was necessary to treat or prevent imminent or life-threatening deterioration. Critical care was time spent personally by me on the following activities: development of treatment plan with patient and/or surrogate as well as nursing, discussions with consultants, evaluation of patient's response to treatment, examination of patient, obtaining history from patient or surrogate, ordering and performing treatments and interventions, ordering and review of laboratory studies, ordering and review of radiographic studies, pulse oximetry and re-evaluation of patient's condition.   Results for orders placed or performed during the hospital encounter of [id number]  Basic metabolic panel  Result Value Ref Range   Sodium 144 135 - 145 mmol/L   Potassium 4.3 3.5 - 5.1 mmol/L   Chloride 107 101 - 111 mmol/L   CO2 28 22 - 32 mmol/L   Glucose, Bld 176 (H) 65 - 99 mg/dL   BUN 11 6 - 20 mg/dL   Creatinine, Ser 0.66 0.61 - 1.24 mg/dL   Calcium 8.8 (L) 8.9 - 10.3 mg/dL   GFR calc non Af Amer >60 >60 mL/min   GFR calc Af Amer >60 >60 mL/min   Anion gap 9 5 - 15  Troponin I  Result Value Ref Range   Troponin I 0.04 (H) <0.031 ng/mL  Brain natriuretic peptide  Result Value Ref Range   B Natriuretic Peptide 80.0 0.0 - 100.0 pg/mL  CBC with Differential  Result Value Ref Range   WBC 14.0 (H) 4.0 - 10.5 K/uL   RBC 4.85 4.22 - 5.81 MIL/uL   Hemoglobin 14.0 13.0 - 17.0 g/dL   HCT 44.2 39.0 - 52.0 %   MCV 91.1 78.0 - 100.0 fL   MCH 28.9 26.0 - 34.0 pg   MCHC 31.7 30.0 - 36.0  g/dL   RDW 15.7 (H) 11.5 - 15.5 %   Platelets 159 150 - 400 K/uL   Neutrophils Relative % 81 %   Neutro Abs 11.4 (H) 1.7 - 7.7 K/uL   Lymphocytes Relative 9 %   Lymphs Abs 1.2 0.7 - 4.0 K/uL   Monocytes Relative 10 %   Monocytes Absolute 1.3 (H) 0.1 - 1.0 K/uL  Eosinophils Relative 0 %   Eosinophils Absolute 0.0 0.0 - 0.7 K/uL   Basophils Relative 0 %   Basophils Absolute 0.0 0.0 - 0.1 K/uL   Dg Chest Port 1 View 10/02/2015  CLINICAL DATA:  Shortness of breath and chest pain EXAM: PORTABLE CHEST 1 VIEW COMPARISON:  09/19/2015 FINDINGS: Hyperinflation with parenchymal scarring compatible with background COPD/ emphysema. Chronic apical scarring. No superimposed pneumonia, collapse or consolidation. Negative for edema, effusion or pneumothorax. Normal heart size and vascularity. No significant interval change. IMPRESSION: Stable COPD/emphysema. Electronically Signed   By: Jerilynn Mages.  Shick M.D.   On: 10/02/2015 21:46     2245:  On arrival: pt's monitor afib/RVR, rates to 160's. IV cardizem bolus and gtt given with slight improvement in HR to 110's-130's. IVF bolus given for BP drop after IV cardizem bolus with improvement. 2nd IV cardizem bolus given with improvement in HR to 90-100's.  Another IVF bolus given for drop in BP after bolus with improvement. Pt "feels better," using his electronic handheld device. Appears NAD, resps easy.  Dx and testing d/w pt.  Questions answered.  Verb understanding, agreeable to admit. T/C to [facility] Dr. Darrick Meigs, case discussed, including:  HPI, pertinent PM/SHx, VS/PE, dx testing, ED course and treatment:  Agreeable to admit, requests to write temporary orders, obtain stepdown bed to team APAdmits.    Francine Graven, DO 10/04/15 1349

## 2015-10-02 NOTE — ED Notes (Signed)
Pt reports onset of chest pain and SOB approx 1 hour ago.

## 2015-10-02 NOTE — H&P (Signed)
PCP:   Neale Burly, MD   Chief Complaint:  Chest pain  HPI:  64 year old male who  has a past medical history of GERD (gastroesophageal reflux disease); COPD (chronic obstructive pulmonary disease) ([place]); S/P aortobifemoral bypass surgery; Atrial fibrillation ([place]); Renal artery stenosis ([place]); Tobacco user; Pneumothorax; Peripheral vascular disease, unspecified ([place]); CAD (coronary artery disease), native coronary artery; Dyslipidemia; DJD (degenerative joint disease); Ejection fraction; Cancer [facility]) (2013); Myocardial infarction ([place]) (2011); and On home O2. Today came to the ED with parents of chest pain which started 2 hours ago. Patient has a history of COPD and says that he used inhaler more than usual and after that he started having chest tightness. In the ED patient was found to be in A. fib with RVR. He denies any chest pain at this time. He denies nausea, vomiting or diarrhea. Patient takes anticoagulation with Eliquis for atrial fibrillation. Patient started on Cardizem infusion in the ED.  Allergies:   Allergies  Allergen Reactions  . Levaquin [Levofloxacin In D5w]     Pt states it caused dry mouth and rectal bleeding.       Past Medical History  Diagnosis Date  . GERD (gastroesophageal reflux disease)   . COPD (chronic obstructive pulmonary disease) ([place])     severe by PFTs August 2010  . 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 Atrial fibrillation (HCC)     post-op a-fib. 3/11. post Aortobifem , /  Atrial fibrillation from nebulizer treatment  May, 2012, while hospitalized  . Renal artery stenosis ([place])      presumably corrected at time of aortobifem surgery March, 2011  . Tobacco user      stop smoking  . Pneumothorax      2011  before aortobifemoral surgery,  resolved  . Peripheral vascular disease, unspecified ([place])     bilateral intermittent claudication  . CAD  (coronary artery disease), native coronary artery     catheterization 06/2009.Marland KitchenMarland Kitchen70% proximal LAD and 30% proximal RCA. normal LV function, no intervention planned prior to surgery for aorta. LV normal function  by catheter 8/10.   [name] Dyslipidemia   . DJD (degenerative joint disease)   . Ejection fraction     60%, echo, May, 2012, rapid atrial fib at the time  . Cancer [facility]) 2013    Prostate Radiation  . Myocardial infarction ([place]) 2011  . On home O2     2L N/C    Past Surgical History  Procedure Laterality Date  . Abdominal aortic aneurysm repair  01/11/2010  . Pr vein bypass graft,aorto-fem-pop    . Inguinal hernia repair      RIGHT    Prior to Admission medications   Medication Sig Start Date End Date Taking? Authorizing Provider  albuterol (PROVENTIL HFA;VENTOLIN HFA) 108 (90 BASE) MCG/ACT inhaler Inhale 2 puffs into the lungs every 4 (four) hours as needed for wheezing or shortness of breath. 09/19/15  Yes Donne [name], MD  albuterol (PROVENTIL) (2.5 MG/3ML) 0.083% nebulizer solution Take 3 mLs (2.5 mg total) by nebulization every 4 (four) hours as needed for wheezing or shortness of breath. 03/15/15  Yes [name], MD  apixaban (ELIQUIS) 5 MG TABS tablet Take 1 tablet (5 mg total) by mouth 2 (two) times daily. 06/20/15  Yes [name], MD  aspirin EC 81 MG tablet Take 81 mg by mouth  once.   Yes Historical Provider, MD  gabapentin (NEURONTIN) 300 MG capsule Take 600 mg by mouth 3 (three) times daily.  02/13/14  Yes Historical Provider, MD  HYDROcodone-acetaminophen (NORCO/VICODIN) 5-325 MG per tablet Take 1 tablet by mouth every 6 (six) hours as needed for moderate pain.   Yes Historical Provider, MD  ipratropium (ATROVENT) 0.02 % nebulizer solution Take 0.5 mg by nebulization every 4 (four) hours as needed for wheezing or shortness of breath.   Yes Historical Provider, MD  metoprolol (LOPRESSOR) 50 MG tablet Take 50 mg by mouth 2 (two) times daily. 12/27/13  Yes Historical  Provider, MD  omeprazole (PRILOSEC) 20 MG capsule Take 1 capsule by mouth every morning.  04/21/11  Yes Historical Provider, MD  OVER THE COUNTER MEDICATION Apply 1 application topically daily. [place] GEL: for pain   Yes Historical Provider, MD  predniSONE (DELTASONE) 10 MG tablet TAKE 6 TABS DAILY X 4 DAYS, 4 DAILY X 4 DAYS, 2 DAILY X 4 DAYS, THEN RESUME 10MG  DAILY 09/25/15  Yes Historical Provider, MD  predniSONE (DELTASONE) 5 MG tablet Take 1 tablet (5 mg total) by mouth daily with breakfast. 09/19/15  Yes Donne [name], MD  simvastatin (ZOCOR) 40 MG tablet TAKE 1 TABLET (40 MG TOTAL) BY MOUTH EVERY EVENING. 08/29/15  Yes [name], MD  benzonatate (TESSALON) 100 MG capsule Take 1 capsule (100 mg total) by mouth 3 (three) times daily as needed for cough. Patient not taking: Reported on 10/02/2015 09/23/15   Donne [name], MD  lidocaine (XYLOCAINE) 5 % ointment Apply 1 application topically daily as needed for mild pain or moderate pain.  07/23/15   Historical Provider, MD  nitroGLYCERIN (NITROSTAT) 0.4 MG SL tablet Place 1 tablet (0.4 mg total) under the tongue every 5 (five) minutes x 3 doses as needed for chest pain. 06/11/15   [name], MD    Social History:  reports that he quit smoking about 5 years ago. His smoking use included Cigarettes. He has a 80 pack-year smoking history. He has never used smokeless tobacco. He reports that he does not drink alcohol or use illicit drugs.  Family History  Problem Relation Age of Onset  . Cancer Father     prostate  . Heart attack Father     Danley Danker Weights   10/02/15 2114 10/02/15 2122  Weight: 63.504 kg (140 lb) 63.504 kg (140 lb)    All the positives are listed in BOLD  Review of Systems:  HEENT: Headache, blurred vision, runny nose, sore throat Neck: Hypothyroidism, hyperthyroidism,,lymphadenopathy Chest : Shortness of breath, history of COPD, Asthma Heart : Chest pain, history of coronary arterey disease GI:  Nausea,  vomiting, diarrhea, constipation, GERD GU: Dysuria, urgency, frequency of urination, hematuria Neuro: Stroke, seizures, syncope Psych: Depression, anxiety, hallucinations   Physical Exam: Blood pressure 82/61, pulse 89, temperature 97.7 F (36.5 C), temperature source Oral, resp. rate 17, height 5\' 4"  (1.626 m), weight 63.504 kg (140 lb), SpO2 100 %. Constitutional:   Patient is a well-developed and well-nourished male* in no acute distress and cooperative with exam. Head: Normocephalic and atraumatic Mouth: Mucus membranes moist Eyes: PERRL, EOMI, conjunctivae normal Neck: Supple, No Thyromegaly Cardiovascular: Irregularly irregular rhythm Pulmonary/Chest: CTAB, no wheezes, rales, or rhonchi Abdominal: Soft. Non-tender, non-distended, bowel sounds are normal, no masses, organomegaly, or guarding present.  Neurological: A&O x3, Strength is normal and symmetric bilaterally, cranial nerve II-XII are grossly intact, no focal motor deficit, sensory intact to light touch bilaterally.  Extremities : No Cyanosis, Clubbing or Edema  Labs on Admission:  Basic Metabolic Panel:  Recent Labs Lab 10/02/15 2130  NA 144  K 4.3  CL 107  CO2 28  GLUCOSE 176*  BUN 11  CREATININE 0.66  CALCIUM 8.8*   CBC:  Recent Labs Lab 10/02/15 2130  WBC 14.0*  NEUTROABS 11.4*  HGB 14.0  HCT 44.2  MCV 91.1  PLT 159   Cardiac Enzymes:  Recent Labs Lab 10/02/15 2130  TROPONINI 0.04*    BNP (last 3 results)  Recent Labs  07/03/15 0345 08/02/15 1932 10/02/15 2130  BNP 28.0 42.0 80.0     Radiological Exams on Admission: Dg Chest Port 1 View  10/02/2015  CLINICAL DATA:  Shortness of breath and chest pain EXAM: PORTABLE CHEST 1 VIEW COMPARISON:  09/19/2015 FINDINGS: Hyperinflation with parenchymal scarring compatible with background COPD/ emphysema. Chronic apical scarring. No superimposed pneumonia, collapse or consolidation. Negative for edema, effusion or pneumothorax. Normal heart  size and vascularity. No significant interval change. IMPRESSION: Stable COPD/emphysema. Electronically Signed   By: Jerilynn Mages.  Shick M.D.   On: 10/02/2015 21:46    EKG: Independently reviewed. Atrial fibrillation with RVR   Assessment/Plan Active Problems:   GERD (gastroesophageal reflux disease)   COPD (chronic obstructive pulmonary disease) (HCC)   Atrial fibrillation with rapid ventricular response (HCC)  Chest pain Likely from demand ischemia from A. fib with RVR, will cycle troponin every 6 hours 3. First troponin 0.04 likely from demand ischemia.  A. fib with RVR Patient takes metoprolol at home, he has been started on Cardizem infusion. Will continue Cardizem infusion stepdown unit. Continue anticoagulation with Eliquis. Restart metoprolol in a.m.  COPD Not in exacerbation at this time Patient was put on tapering dose of steroids as in the home med list. We'll continue prednisone 40 mg daily for 4 days. Patient needs to be sent home on prednisone taper. Start Xopenex nebulizers every 6 hours when necessary. Will avoid albuterol due to A. Fib. Continue ipratropium every 4 hours when necessary   Code status: Full code  Family discussion: No family present at bedside.   Time Spent on Admission: 60 min  [facility] Pager: [phone] 10/02/2015, 11:47 PM  If 7PM-7AM, please contact night-coverage  [email or web address]  Password [email or web address]

## 2015-10-03 DIAGNOSIS — J438 Other emphysema: Secondary | ICD-10-CM | POA: Diagnosis not present

## 2015-10-03 DIAGNOSIS — Z7982 Long term (current) use of aspirin: Secondary | ICD-10-CM | POA: Diagnosis not present

## 2015-10-03 DIAGNOSIS — E785 Hyperlipidemia, unspecified: Secondary | ICD-10-CM | POA: Diagnosis present

## 2015-10-03 DIAGNOSIS — Z87891 Personal history of nicotine dependence: Secondary | ICD-10-CM | POA: Diagnosis not present

## 2015-10-03 DIAGNOSIS — Z8042 Family history of malignant neoplasm of prostate: Secondary | ICD-10-CM | POA: Diagnosis not present

## 2015-10-03 DIAGNOSIS — Z9981 Dependence on supplemental oxygen: Secondary | ICD-10-CM | POA: Diagnosis not present

## 2015-10-03 DIAGNOSIS — I2510 Atherosclerotic heart disease of native coronary artery without angina pectoris: Secondary | ICD-10-CM | POA: Diagnosis present

## 2015-10-03 DIAGNOSIS — R079 Chest pain, unspecified: Secondary | ICD-10-CM | POA: Diagnosis present

## 2015-10-03 DIAGNOSIS — K219 Gastro-esophageal reflux disease without esophagitis: Secondary | ICD-10-CM | POA: Diagnosis present

## 2015-10-03 DIAGNOSIS — Z8546 Personal history of malignant neoplasm of prostate: Secondary | ICD-10-CM | POA: Diagnosis not present

## 2015-10-03 DIAGNOSIS — J449 Chronic obstructive pulmonary disease, unspecified: Secondary | ICD-10-CM | POA: Diagnosis present

## 2015-10-03 DIAGNOSIS — I248 Other forms of acute ischemic heart disease: Secondary | ICD-10-CM | POA: Diagnosis present

## 2015-10-03 DIAGNOSIS — Z923 Personal history of irradiation: Secondary | ICD-10-CM | POA: Diagnosis not present

## 2015-10-03 DIAGNOSIS — I739 Peripheral vascular disease, unspecified: Secondary | ICD-10-CM | POA: Diagnosis present

## 2015-10-03 DIAGNOSIS — I252 Old myocardial infarction: Secondary | ICD-10-CM | POA: Diagnosis not present

## 2015-10-03 DIAGNOSIS — M1990 Unspecified osteoarthritis, unspecified site: Secondary | ICD-10-CM | POA: Diagnosis present

## 2015-10-03 DIAGNOSIS — I4891 Unspecified atrial fibrillation: Secondary | ICD-10-CM | POA: Diagnosis present

## 2015-10-03 DIAGNOSIS — Z7901 Long term (current) use of anticoagulants: Secondary | ICD-10-CM | POA: Diagnosis not present

## 2015-10-03 LAB — COMPREHENSIVE METABOLIC PANEL
ALK PHOS: 36 U/L — AB (ref 38–126)
ALT: 42 U/L (ref 17–63)
ANION GAP: 3 — AB (ref 5–15)
AST: 32 U/L (ref 15–41)
Albumin: 2.7 g/dL — ABNORMAL LOW (ref 3.5–5.0)
BUN: 10 mg/dL (ref 6–20)
CALCIUM: 8 mg/dL — AB (ref 8.9–10.3)
CHLORIDE: 113 mmol/L — AB (ref 101–111)
CO2: 30 mmol/L (ref 22–32)
Creatinine, Ser: 0.48 mg/dL — ABNORMAL LOW (ref 0.61–1.24)
GFR calc non Af Amer: >60 mL/min (ref >60)
Glucose, Bld: 105 mg/dL — ABNORMAL HIGH (ref 65–99)
Potassium: 4.1 mmol/L (ref 3.5–5.1)
SODIUM: 146 mmol/L — AB (ref 135–145)
Total Bilirubin: 0.4 mg/dL (ref 0.3–1.2)
Total Protein: 4.9 g/dL — ABNORMAL LOW (ref 6.5–8.1)

## 2015-10-03 LAB — TROPONIN I
TROPONIN I: 0.09 ng/mL — AB (ref <0.031)
TROPONIN I: 0.1 ng/mL — AB (ref <0.031)
Troponin I: 0.18 ng/mL — ABNORMAL HIGH (ref <0.031)
Troponin I: 0.13 ng/mL — ABNORMAL HIGH (ref <0.031)

## 2015-10-03 LAB — CBC
HCT: 37.4 % — ABNORMAL LOW (ref 39.0–52.0)
Hemoglobin: 11.6 g/dL — ABNORMAL LOW (ref 13.0–17.0)
MCH: 28.6 pg (ref 26.0–34.0)
MCHC: 31 g/dL (ref 30.0–36.0)
MCV: 92.3 fL (ref 78.0–100.0)
PLATELETS: 136 10*3/uL — AB (ref 150–400)
RBC: 4.05 MIL/uL — ABNORMAL LOW (ref 4.22–5.81)
RDW: 16 % — ABNORMAL HIGH (ref 11.5–15.5)
WBC: 12.3 10*3/uL — ABNORMAL HIGH (ref 4.0–10.5)

## 2015-10-03 LAB — MRSA PCR SCREENING: MRSA BY PCR: NEGATIVE

## 2015-10-03 MED ORDER — LEVALBUTEROL HCL 0.63 MG/3ML IN NEBU
0.6300 mg | INHALATION_SOLUTION | Freq: Four times a day (QID) | RESPIRATORY_TRACT | Status: DC
  Administered 2015-10-03 – 2015-10-04 (×5): 0.63 mg via RESPIRATORY_TRACT
  Filled 2015-10-03 (×5): qty 3

## 2015-10-03 MED ORDER — IPRATROPIUM BROMIDE 0.02 % IN SOLN
0.5000 mg | Freq: Four times a day (QID) | RESPIRATORY_TRACT | Status: DC
  Administered 2015-10-03 – 2015-10-04 (×5): 0.5 mg via RESPIRATORY_TRACT
  Filled 2015-10-03 (×5): qty 2.5

## 2015-10-03 NOTE — Progress Notes (Signed)
[facility] PROGRESS NOTE  Kenneth Merritt [id number] DOB: [birth year]    PCP:   Neale Burly, MD   HPI: Kenneth Merritt is an 64 y.o. male with hx of afib on anticoagulation, COPD on ventolin inhaler, s/p Aortofemoral bypass, hx of prior cigarette use, admitted for afib with RVR, suspicious due to overuse of inhaler.  He has some chest pain and is being r/out as well.  BP is soft.  HR variable and is on diltiazem drip at this time.   Rewiew of Systems:  Constitutional: Negative for malaise, fever and chills. No significant weight loss or weight gain Eyes: Negative for eye pain, redness and discharge, diplopia, visual changes, or flashes of light. ENMT: Negative for ear pain, hoarseness, nasal congestion, sinus pressure and sore throat. No headaches; tinnitus, drooling, or problem swallowing. Cardiovascular: Negative for chest pain, palpitations, diaphoresis, dyspnea and peripheral edema. ; No orthopnea, PND Respiratory: Negative for cough, hemoptysis, wheezing and stridor. No pleuritic chestpain. Gastrointestinal: Negative for nausea, vomiting, diarrhea, constipation, abdominal pain, melena, blood in stool, hematemesis, jaundice and rectal bleeding.    Genitourinary: Negative for frequency, dysuria, incontinence,flank pain and hematuria; Musculoskeletal: Negative for back pain and neck pain. Negative for swelling and trauma.;  Skin: . Negative for pruritus, rash, abrasions, bruising and skin lesion.; ulcerations Neuro: Negative for headache, lightheadedness and neck stiffness. Negative for weakness, altered level of consciousness , altered mental status, extremity weakness, burning feet, involuntary movement, seizure and syncope.  Psych: negative for anxiety, depression, insomnia, tearfulness, panic attacks, hallucinations, paranoia, suicidal or homicidal ideation   Past Medical History  Diagnosis Date  . GERD (gastroesophageal reflux disease)   . COPD (chronic obstructive pulmonary  disease) ([place])     severe by PFTs August 2010  . 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 Atrial fibrillation (HCC)     post-op a-fib. 3/11. post Aortobifem , /  Atrial fibrillation from nebulizer treatment  May, 2012, while hospitalized  . Renal artery stenosis ([place])      presumably corrected at time of aortobifem surgery March, 2011  . Tobacco user      stop smoking  . Pneumothorax      2011  before aortobifemoral surgery,  resolved  . Peripheral vascular disease, unspecified ([place])     bilateral intermittent claudication  . CAD (coronary artery disease), native coronary artery     catheterization 06/2009.Marland KitchenMarland Kitchen70% proximal LAD and 30% proximal RCA. normal LV function, no intervention planned prior to surgery for aorta. LV normal function  by catheter 8/10.   [name] Dyslipidemia   . DJD (degenerative joint disease)   . Ejection fraction     60%, echo, May, 2012, rapid atrial fib at the time  . Cancer [facility]) 2013    Prostate Radiation  . Myocardial infarction ([place]) 2011  . On home O2     2L N/C    Past Surgical History  Procedure Laterality Date  . Abdominal aortic aneurysm repair  01/11/2010  . Pr vein bypass graft,aorto-fem-pop    . Inguinal hernia repair      RIGHT    Medications:  HOME MEDS: Prior to Admission medications   Medication Sig Start Date End Date Taking? Authorizing Provider  albuterol (PROVENTIL HFA;VENTOLIN HFA) 108 (90 BASE) MCG/ACT inhaler Inhale 2 puffs into the lungs every 4 (four) hours as needed for wheezing or shortness of  breath. 09/19/15  Yes Donne [name], MD  albuterol (PROVENTIL) (2.5 MG/3ML) 0.083% nebulizer solution Take 3 mLs (2.5 mg total) by nebulization every 4 (four) hours as needed for wheezing or shortness of breath. 03/15/15  Yes [name], MD  apixaban (ELIQUIS) 5 MG TABS tablet Take 1 tablet (5 mg total) by mouth 2 (two) times daily. 06/20/15  Yes [name], MD  aspirin EC 81 MG tablet Take 81 mg by mouth once.   Yes Historical Provider, MD  omeprazole (PRILOSEC) 20 MG capsule Take 1 capsule by mouth every morning.  04/21/11  Yes Historical Provider, MD  OVER THE COUNTER MEDICATION Apply 1 application topically daily. [place] GEL: for pain   Yes Historical Provider, MD  predniSONE (DELTASONE) 10 MG tablet TAKE 6 TABS DAILY X 4 DAYS, 4 DAILY X 4 DAYS, 2 DAILY X 4 DAYS, THEN RESUME 10MG  DAILY 09/25/15  Yes Historical Provider, MD  predniSONE (DELTASONE) 5 MG tablet Take 1 tablet (5 mg total) by mouth daily with breakfast. 09/19/15  Yes Donne [name], MD  simvastatin (ZOCOR) 40 MG tablet TAKE 1 TABLET (40 MG TOTAL) BY MOUTH EVERY EVENING. 08/29/15  Yes [name], MD  benzonatate (TESSALON) 100 MG capsule Take 1 capsule (100 mg total) by mouth 3 (three) times daily as needed for cough. Patient not taking: Reported on 10/02/2015 09/23/15   Donne [name], MD  gabapentin (NEURONTIN) 300 MG capsule Take 600 mg by mouth 3 (three) times daily.  02/13/14   Historical Provider, MD  HYDROcodone-acetaminophen (NORCO/VICODIN) 5-325 MG per tablet Take 1 tablet by mouth every 6 (six) hours as needed for moderate pain.    Historical Provider, MD  ipratropium (ATROVENT) 0.02 % nebulizer solution Take 0.5 mg by nebulization every 4 (four) hours as needed for wheezing or shortness of breath.    Historical Provider, MD  lidocaine (XYLOCAINE) 5 % ointment Apply 1 application topically daily as needed for mild pain or moderate pain.  07/23/15   Historical Provider, MD  metoprolol (LOPRESSOR) 50 MG tablet Take 50 mg by mouth 2 (two) times daily. 12/27/13   Historical Provider, MD  nitroGLYCERIN (NITROSTAT) 0.4 MG SL tablet Place 1 tablet (0.4 mg total) under the tongue every 5 (five) minutes x 3 doses as needed for chest pain. 06/11/15   [name], MD     Allergies:  Allergies  Allergen Reactions  . Levaquin [Levofloxacin In D5w]     Pt states it caused dry  mouth and rectal bleeding.     Social History:   reports that he quit smoking about 5 years ago. His smoking use included Cigarettes. He has a 80 pack-year smoking history. He has never used smokeless tobacco. He reports that he does not drink alcohol or use illicit drugs.  Family History: Family History  Problem Relation Age of Onset  . Cancer Father     prostate  . Heart attack Father      Physical Exam: Filed Vitals:   10/03/15 0600 10/03/15 0700 10/03/15 0800 10/03/15 0823  BP: 91/62 89/70 89/52    Pulse: 73 62 143   Temp:      TempSrc:      Resp: 17 15 22    Height:      Weight:      SpO2: 100% 100% 100% 100%   Blood pressure 89/52, pulse 143, temperature 97.8 F (36.6 C), temperature source Oral, resp. rate 22, height 5\' 4"  (1.626 m), weight 68.9 kg (151 lb  14.4 oz), SpO2 100 %.  GEN:  Pleasant  patient lying in the stretcher in no acute distress; cooperative with exam. PSYCH:  alert and oriented x4; does not appear anxious or depressed; affect is appropriate. HEENT: Mucous membranes pink and anicteric; PERRLA; EOM intact; no cervical lymphadenopathy nor thyromegaly or carotid bruit; no JVD; There were no stridor. Neck is very supple. Breasts:: Not examined CHEST WALL: No tenderness CHEST: Normal respiration, clear to auscultation bilaterally.  HEART: Irregular.   There are no murmur, rub, or gallops.   BACK: No kyphosis or scoliosis; no CVA tenderness ABDOMEN: soft and non-tender; no masses, no organomegaly, normal abdominal bowel sounds; no pannus; no intertriginous candida. There is no rebound and no distention. Rectal Exam: Not done EXTREMITIES: No bone or joint deformity; age-appropriate arthropathy of the hands and knees; no edema; no ulcerations.  There is no calf tenderness. Genitalia: not examined PULSES: 2+ and symmetric SKIN: Normal hydration no rash or ulceration CNS: Cranial nerves 2-12 grossly intact no focal lateralizing neurologic deficit.  Speech is  fluent; uvula elevated with phonation, facial symmetry and tongue midline. DTR are normal bilaterally, cerebella exam is intact, barbinski is negative and strengths are equaled bilaterally.  No sensory loss.   Labs on Admission:  Basic Metabolic Panel:  Recent Labs Lab 10/02/15 2130 10/03/15 0459  NA 144 146*  K 4.3 4.1  CL 107 113*  CO2 28 30  GLUCOSE 176* 105*  BUN 11 10  CREATININE 0.66 0.48*  CALCIUM 8.8* 8.0*   Liver Function Tests:  Recent Labs Lab 10/03/15 0459  AST 32  ALT 42  ALKPHOS 36*  BILITOT 0.4  PROT 4.9*  ALBUMIN 2.7*   CBC:  Recent Labs Lab 10/02/15 2130 10/03/15 0459  WBC 14.0* 12.3*  NEUTROABS 11.4*  --   HGB 14.0 11.6*  HCT 44.2 37.4*  MCV 91.1 92.3  PLT 159 136*   Cardiac Enzymes:  Recent Labs Lab 10/02/15 2130 10/03/15 0002 10/03/15 0459  TROPONINI 0.04* 0.10* 0.18*   Radiological Exams on Admission: Dg Chest Port 1 View  10/02/2015  CLINICAL DATA:  Shortness of breath and chest pain EXAM: PORTABLE CHEST 1 VIEW COMPARISON:  09/19/2015 FINDINGS: Hyperinflation with parenchymal scarring compatible with background COPD/ emphysema. Chronic apical scarring. No superimposed pneumonia, collapse or consolidation. Negative for edema, effusion or pneumothorax. Normal heart size and vascularity. No significant interval change. IMPRESSION: Stable COPD/emphysema. Electronically Signed   By: Jerilynn Mages.  Shick M.D.   On: 10/02/2015 21:46    EKG: Independently reviewed.    Assessment/Plan Present on Admission:  . Atrial fibrillation with rapid ventricular response ([place]) . GERD (gastroesophageal reflux disease) . COPD (chronic obstructive pulmonary disease) (HCC)   PLAN:  Chest pain Likely from demand ischemia from A. fib with RVR, will cycle troponin every 6 hours 3. Troponin is still going up, will contiue to trend for peak.   A. fib with RVR Patient takes metoprolol at home, he has been started on Cardizem infusion. Will continue Cardizem  infusion stepdown unit. Continue anticoagulation with Eliquis. Restart metoprolol in a.m.  COPD Not in exacerbation at this time Patient was put on tapering dose of steroids as in the home med list. We'll continue prednisone 40 mg daily for 4 days. Patient needs to be sent home on prednisone taper. Start Xopenex nebulizers every 6 hours when necessary. Will avoid albuterol due to A. Fib. Continue ipratropium every 4 hours when necessary   Other plans as per orders.  Code Status:FULL CODE.  Orvan Falconer, MD. [facility] Pager [phone] 7pm to 7am.  10/03/2015, 9:14 AM

## 2015-10-03 NOTE — Care Management Obs Status (Signed)
[facility] NOTIFICATION   Patient Details  Name: MARSHAWN CLINE MRN: [id number] Date of Birth: 06-Dec-1950   Medicare Observation Status Notification Given:  Yes    Sherald Barge, RN 10/03/2015, 2:39 PM

## 2015-10-03 NOTE — Progress Notes (Signed)
12 Lead EKG done.  MD informed about results and discontinuing cardizem drip.

## 2015-10-03 NOTE — Care Management Note (Signed)
Case Management Note  Patient Details  Name: MAALIK TEINERT MRN: [id number] Date of Birth: 07-Oct-1951  Subjective/Objective:                  Pt is from home, lives with wife and family. Pt has home O2 and neb machine prior to admission. Pt admitted for uncontrolled a-fib.   Action/Plan: Pt anticipates returning home with self care at DC. No CM needs anticipated.   Expected Discharge Date:       10/04/2015           Expected Discharge Plan:  Home/Self Care  In-House Referral:  NA  Discharge planning Services  CM Consult  Post Acute Care Choice:  NA Choice offered to:  NA  DME Arranged:    DME Agency:     HH Arranged:    HH Agency:     Status of Service:  Completed, signed off  Medicare Important Message Given:    Date Medicare IM Given:    Medicare IM give by:    Date Additional Medicare IM Given:    Additional Medicare Important Message give by:     If discussed at [place] of Stay Meetings, dates discussed:    Additional Comments:  Sherald Barge, RN 10/03/2015, 2:39 PM

## 2015-10-04 MED ORDER — PREDNISONE 10 MG (21) PO TBPK: ORAL_TABLET | ORAL | Status: DC

## 2015-10-04 NOTE — Discharge Summary (Signed)
Physician Discharge Summary  Kenneth Merritt [id number] DOB: 1951-06-24 DOA: 10/02/2015  PCP: Neale Burly, MD  Admit date: 10/02/2015 Discharge date: 10/04/2015  Time spent: 35 minutes  Recommendations for Outpatient Follow-up:  1. Follow up with PCP next week.    Discharge Diagnoses:  Active Problems:   GERD (gastroesophageal reflux disease)   COPD (chronic obstructive pulmonary disease) (HCC)   Atrial fibrillation with rapid ventricular response [facility])   Discharge Condition: Much improved.   Diet recommendation: heart healthy.   Filed Weights   10/02/15 2351 10/03/15 0500 10/04/15 0500  Weight: 68.2 kg (150 lb 5.7 oz) 68.9 kg (151 lb 14.4 oz) 66.7 kg (147 lb 0.8 oz)    History of present illness: Patient was admitted by Dr Darrick Meigs on Oct 02, 2015 for chest tightness.  As per his H and P:  " 64 year old male who  has a past medical history of GERD (gastroesophageal reflux disease); COPD (chronic obstructive pulmonary disease) ([place]); S/P aortobifemoral bypass surgery; Atrial fibrillation ([place]); Renal artery stenosis ([place]); Tobacco user; Pneumothorax; Peripheral vascular disease, unspecified ([place]); CAD (coronary artery disease), native coronary artery; Dyslipidemia; DJD (degenerative joint disease); Ejection fraction; Cancer [facility]) (2013); Myocardial infarction ([place]) (2011); and On home O2. Today came to the ED with parents of chest pain which started 2 hours ago. Patient has a history of COPD and says that he used inhaler more than usual and after that he started having chest tightness. In the ED patient was found to be in A. fib with RVR. He denies any chest pain at this time. He denies nausea, vomiting or diarrhea. Patient takes anticoagulation with Eliquis for atrial fibrillation. Patient started on Cardizem infusion in the ED.   Hospital Course: Patient was admitted OBS into the ICU, and his diltiazem drip was continued.  His troponins rose to 0.18, and trended down.  It was  felt that he had troponin leaks from demand ischemia.  His rate was controlled, and he had no recurrence of his symptoms.  It was postulated that his afib with RVR was induced in part due to his overuse of Ventolin Inhaler.  He was given Xopenex in the past as outpatient, but due to cost constraint, he was not going to get it.  He was advised not to over use his Ventolin Inhaler, and if he has COPD exacerbation, to initiate steroid.  He did not have any further chest discomfort.  His meds were resumed and diltiazem was discontinued for at least 24 hours without recurrent tachyarrythmia.  He was continued on his Eliquis as well.  He will be given a quick steroid taper.   He is anxious to go home, and is stable for discharge.  Thank you and Good Day.   Consultations:  NONE>  Discharge Exam: Filed Vitals:   10/04/15 0744 10/04/15 0800  BP:  139/85  Pulse:  93  Temp: 98.3 F (36.8 C)   Resp:       Discharge Instructions    Diet - low sodium heart healthy    Complete by:  As directed      Discharge instructions    Complete by:  As directed   Don't over use your Ventolin Inhaler as it will make your heart go too fast.  If your emphysema flares, you can use Prednisone to treat it as well.     Increase activity slowly    Complete by:  As directed           Current Discharge  Medication List    START taking these medications   Details  predniSONE (STERAPRED UNI-PAK 21 TAB) 10 MG (21) TBPK tablet Use as directed. Qty: 21 tablet, Refills: 0      CONTINUE these medications which have NOT CHANGED   Details  albuterol (PROVENTIL HFA;VENTOLIN HFA) 108 (90 BASE) MCG/ACT inhaler Inhale 2 puffs into the lungs every 4 (four) hours as needed for wheezing or shortness of breath. Qty: 1 Inhaler, Refills: 0    albuterol (PROVENTIL) (2.5 MG/3ML) 0.083% nebulizer solution Take 3 mLs (2.5 mg total) by nebulization every 4 (four) hours as needed for wheezing or shortness of breath. Qty: 75 mL, Refills:  12    apixaban (ELIQUIS) 5 MG TABS tablet Take 1 tablet (5 mg total) by mouth 2 (two) times daily. Qty: 60 tablet, Refills: 6    aspirin EC 81 MG tablet Take 81 mg by mouth once.    omeprazole (PRILOSEC) 20 MG capsule Take 1 capsule by mouth every morning.     simvastatin (ZOCOR) 40 MG tablet TAKE 1 TABLET (40 MG TOTAL) BY MOUTH EVERY EVENING. Qty: 30 tablet, Refills: 9    gabapentin (NEURONTIN) 300 MG capsule Take 600 mg by mouth 3 (three) times daily.     HYDROcodone-acetaminophen (NORCO/VICODIN) 5-325 MG per tablet Take 1 tablet by mouth every 6 (six) hours as needed for moderate pain.    ipratropium (ATROVENT) 0.02 % nebulizer solution Take 0.5 mg by nebulization every 4 (four) hours as needed for wheezing or shortness of breath.    lidocaine (XYLOCAINE) 5 % ointment Apply 1 application topically daily as needed for mild pain or moderate pain.  Refills: 0    metoprolol (LOPRESSOR) 50 MG tablet Take 50 mg by mouth 2 (two) times daily.    nitroGLYCERIN (NITROSTAT) 0.4 MG SL tablet Place 1 tablet (0.4 mg total) under the tongue every 5 (five) minutes x 3 doses as needed for chest pain. Qty: 25 tablet, Refills: 3      STOP taking these medications     OVER THE COUNTER MEDICATION      predniSONE (DELTASONE) 10 MG tablet      predniSONE (DELTASONE) 5 MG tablet      benzonatate (TESSALON) 100 MG capsule        Allergies  Allergen Reactions  . Levaquin [Levofloxacin In D5w]     Pt states it caused dry mouth and rectal bleeding.       The results of significant diagnostics from this hospitalization (including imaging, microbiology, ancillary and laboratory) are listed below for reference.    Significant Diagnostic Studies: Dg Chest Port 1 View  10/02/2015  CLINICAL DATA:  Shortness of breath and chest pain EXAM: PORTABLE CHEST 1 VIEW COMPARISON:  09/19/2015 FINDINGS: Hyperinflation with parenchymal scarring compatible with background COPD/ emphysema. Chronic apical  scarring. No superimposed pneumonia, collapse or consolidation. Negative for edema, effusion or pneumothorax. Normal heart size and vascularity. No significant interval change. IMPRESSION: Stable COPD/emphysema. Electronically Signed   By: Jerilynn Mages.  Shick M.D.   On: 10/02/2015 21:46   Dg Chest Port 1 View  09/19/2015  CLINICAL DATA:  Initial evaluation for shortness of breath, wheezing, nonproductive cough since yesterday. EXAM: PORTABLE CHEST 1 VIEW COMPARISON:  Prior radiograph from 09/19/2015. FINDINGS: Patient is rotated to the right. The cardiac and mediastinal silhouettes are stable in size and contour, and remain within normal limits. The lungs are hyperinflated with attenuation the pulmonary markings, consistent with COPD, similar to prior. Asymmetric opacity at the peripheral  right upper lobe favored to related to patient rotation. No airspace consolidation, pleural effusion, or pulmonary edema is identified. There is no pneumothorax. No acute osseous abnormality identified. IMPRESSION: 1. Emphysema.  No other active cardiopulmonary disease. 2. Asymmetric opacity at the peripheral right upper lobe, favored to be related to patient positioning/technique. Electronically Signed   By: Jeannine Boga M.D.   On: 09/19/2015 22:47   Dg Chest Portable 1 View  09/17/2015  CLINICAL DATA:  Shortness of breath and wheezing tonight. EXAM: PORTABLE CHEST 1 VIEW COMPARISON:  08/07/2015. FINDINGS: The cardiac silhouette, mediastinal and hilar contours are within normal limits and stable. There is mild tortuosity and calcification of the thoracic aorta. The lungs demonstrate stable emphysematous changes and pulmonary scarring. No definite acute overlying pulmonary process. No pleural effusions. IMPRESSION: Emphysematous changes and apical scarring but no definite acute overlying pulmonary process. Electronically Signed   By: Marijo Sanes M.D.   On: 09/17/2015 23:57    Microbiology: Recent Results (from the past  240 hour(s))  MRSA PCR Screening     Status: None   Collection Time: 10/02/15 11:44 PM  Result Value Ref Range Status   MRSA by PCR NEGATIVE NEGATIVE Final    Comment:        The GeneXpert MRSA Assay (FDA approved for NASAL specimens only), is one component of a comprehensive MRSA colonization surveillance program. It is not intended to diagnose MRSA infection nor to guide or monitor treatment for MRSA infections.      Labs: Basic Metabolic Panel:  Recent Labs Lab 10/02/15 2130 10/03/15 0459  NA 144 146*  K 4.3 4.1  CL 107 113*  CO2 28 30  GLUCOSE 176* 105*  BUN 11 10  CREATININE 0.66 0.48*  CALCIUM 8.8* 8.0*   Liver Function Tests:  Recent Labs Lab 10/03/15 0459  AST 32  ALT 42  ALKPHOS 36*  BILITOT 0.4  PROT 4.9*  ALBUMIN 2.7*   CBC:  Recent Labs Lab 10/02/15 2130 10/03/15 0459  WBC 14.0* 12.3*  NEUTROABS 11.4*  --   HGB 14.0 11.6*  HCT 44.2 37.4*  MCV 91.1 92.3  PLT 159 136*   Cardiac Enzymes:  Recent Labs Lab 10/02/15 2130 10/03/15 0002 10/03/15 0459 10/03/15 0936 10/03/15 1532  TROPONINI 0.04* 0.10* 0.18* 0.13* 0.09*    Recent Labs  07/03/15 0345 08/02/15 1932 10/02/15 2130  BNP 28.0 42.0 80.0   Signed:  Lui Bellis  [facility] 10/04/2015, 9:05 AM

## 2015-11-12 ENCOUNTER — Observation Stay (HOSPITAL_COMMUNITY)
Admission: EM | Admit: 2015-11-12 | Discharge: 2015-11-13 | Disposition: A | Discharge status: Home / Self Care | Payer: Medicare Other | Attending: Internal Medicine | Admitting: Internal Medicine

## 2015-11-12 ENCOUNTER — Encounter (HOSPITAL_COMMUNITY): Payer: Self-pay | Admitting: Emergency Medicine

## 2015-11-12 ENCOUNTER — Emergency Department (HOSPITAL_COMMUNITY): Payer: Medicare Other

## 2015-11-12 DIAGNOSIS — Z9981 Dependence on supplemental oxygen: Secondary | ICD-10-CM | POA: Insufficient documentation

## 2015-11-12 DIAGNOSIS — I739 Peripheral vascular disease, unspecified: Secondary | ICD-10-CM | POA: Diagnosis not present

## 2015-11-12 DIAGNOSIS — R0789 Other chest pain: Secondary | ICD-10-CM | POA: Insufficient documentation

## 2015-11-12 DIAGNOSIS — Z7952 Long term (current) use of systemic steroids: Secondary | ICD-10-CM | POA: Insufficient documentation

## 2015-11-12 DIAGNOSIS — I480 Paroxysmal atrial fibrillation: Secondary | ICD-10-CM | POA: Diagnosis present

## 2015-11-12 DIAGNOSIS — I252 Old myocardial infarction: Secondary | ICD-10-CM | POA: Diagnosis not present

## 2015-11-12 DIAGNOSIS — J9610 Chronic respiratory failure, unspecified whether with hypoxia or hypercapnia: Secondary | ICD-10-CM | POA: Diagnosis present

## 2015-11-12 DIAGNOSIS — J939 Pneumothorax, unspecified: Secondary | ICD-10-CM | POA: Diagnosis not present

## 2015-11-12 DIAGNOSIS — Z87891 Personal history of nicotine dependence: Secondary | ICD-10-CM | POA: Diagnosis not present

## 2015-11-12 DIAGNOSIS — J441 Chronic obstructive pulmonary disease with (acute) exacerbation: Secondary | ICD-10-CM | POA: Diagnosis not present

## 2015-11-12 DIAGNOSIS — Z95828 Presence of other vascular implants and grafts: Secondary | ICD-10-CM

## 2015-11-12 DIAGNOSIS — I4891 Unspecified atrial fibrillation: Secondary | ICD-10-CM | POA: Insufficient documentation

## 2015-11-12 DIAGNOSIS — K219 Gastro-esophageal reflux disease without esophagitis: Secondary | ICD-10-CM | POA: Diagnosis present

## 2015-11-12 DIAGNOSIS — E785 Hyperlipidemia, unspecified: Secondary | ICD-10-CM | POA: Diagnosis not present

## 2015-11-12 DIAGNOSIS — I2510 Atherosclerotic heart disease of native coronary artery without angina pectoris: Secondary | ICD-10-CM | POA: Diagnosis present

## 2015-11-12 DIAGNOSIS — Z8546 Personal history of malignant neoplasm of prostate: Secondary | ICD-10-CM | POA: Diagnosis not present

## 2015-11-12 DIAGNOSIS — I701 Atherosclerosis of renal artery: Secondary | ICD-10-CM | POA: Diagnosis not present

## 2015-11-12 DIAGNOSIS — Z79899 Other long term (current) drug therapy: Secondary | ICD-10-CM | POA: Insufficient documentation

## 2015-11-12 DIAGNOSIS — R0602 Shortness of breath: Secondary | ICD-10-CM | POA: Diagnosis present

## 2015-11-12 LAB — CBC WITH DIFFERENTIAL/PLATELET
BASOS PCT: 0 %
Basophils Absolute: 0 10*3/uL (ref 0.0–0.1)
EOS ABS: 0 10*3/uL (ref 0.0–0.7)
EOS PCT: 0 %
HCT: 43.1 % (ref 39.0–52.0)
Hemoglobin: 13.7 g/dL (ref 13.0–17.0)
Lymphocytes Relative: 14 %
Lymphs Abs: 1.6 10*3/uL (ref 0.7–4.0)
MCH: 29.2 pg (ref 26.0–34.0)
MCHC: 31.8 g/dL (ref 30.0–36.0)
MCV: 91.9 fL (ref 78.0–100.0)
MONO ABS: 1 10*3/uL (ref 0.1–1.0)
MONOS PCT: 8 %
NEUTROS PCT: 78 %
Neutro Abs: 9.1 10*3/uL — ABNORMAL HIGH (ref 1.7–7.7)
PLATELETS: 210 10*3/uL (ref 150–400)
RBC: 4.69 MIL/uL (ref 4.22–5.81)
RDW: 14.5 % (ref 11.5–15.5)
WBC: 11.7 10*3/uL — ABNORMAL HIGH (ref 4.0–10.5)

## 2015-11-12 LAB — HEPATIC FUNCTION PANEL
ALBUMIN: 3.8 g/dL (ref 3.5–5.0)
ALT: 27 U/L (ref 17–63)
AST: 28 U/L (ref 15–41)
Alkaline Phosphatase: 57 U/L (ref 38–126)
BILIRUBIN TOTAL: 0.7 mg/dL (ref 0.3–1.2)
Bilirubin, Direct: 0.2 mg/dL (ref 0.1–0.5)
Indirect Bilirubin: 0.5 mg/dL (ref 0.3–0.9)
TOTAL PROTEIN: 6.9 g/dL (ref 6.5–8.1)

## 2015-11-12 LAB — LIPASE, BLOOD: LIPASE: 30 U/L (ref 11–51)

## 2015-11-12 LAB — BASIC METABOLIC PANEL
Anion gap: 10 (ref 5–15)
BUN: 10 mg/dL (ref 6–20)
CALCIUM: 9.3 mg/dL (ref 8.9–10.3)
CO2: 29 mmol/L (ref 22–32)
CREATININE: 0.58 mg/dL — AB (ref 0.61–1.24)
Chloride: 104 mmol/L (ref 101–111)
GFR calc non Af Amer: >60 mL/min (ref >60)
Glucose, Bld: 160 mg/dL — ABNORMAL HIGH (ref 65–99)
Potassium: 4.3 mmol/L (ref 3.5–5.1)
SODIUM: 143 mmol/L (ref 135–145)

## 2015-11-12 LAB — TROPONIN I

## 2015-11-12 MED ORDER — METHYLPREDNISOLONE SODIUM SUCC 125 MG IJ SOLR
125.0000 mg | Freq: Once | INTRAMUSCULAR | Status: AC
  Administered 2015-11-12: 125 mg via INTRAVENOUS
  Filled 2015-11-12: qty 2

## 2015-11-12 MED ORDER — IPRATROPIUM-ALBUTEROL 0.5-2.5 (3) MG/3ML IN SOLN: 3.0000 mL | Freq: Once | RESPIRATORY_TRACT | Status: DC

## 2015-11-12 MED ORDER — LEVALBUTEROL HCL 1.25 MG/0.5ML IN NEBU
1.2500 mg | INHALATION_SOLUTION | Freq: Once | RESPIRATORY_TRACT | Status: AC
  Administered 2015-11-12: 1.25 mg via RESPIRATORY_TRACT
  Filled 2015-11-12: qty 0.5

## 2015-11-12 MED ORDER — LEVALBUTEROL HCL 1.25 MG/0.5ML IN NEBU
INHALATION_SOLUTION | RESPIRATORY_TRACT | Status: AC
  Administered 2015-11-12: 1.25 mg
  Filled 2015-11-12: qty 0.5

## 2015-11-12 NOTE — ED Notes (Signed)
Patient complaining of shortness of breath starting approximately 1 hour ago. Patient states he is currently on continuous home O2 2 1/2 L. Patient arrives to ED without use of oxygen. Patient has audible wheezing at triage.

## 2015-11-12 NOTE — ED Provider Notes (Signed)
CSN: [id number]     Arrival date & time 11/12/15  2051 History  By signing my name below, I, Soijett Blue, attest that this documentation has been prepared under the direction and in the presence of Milton Ferguson, MD. Electronically Signed: Soijett Blue, ED Scribe. 11/12/2015. 9:42 PM.   Chief Complaint  Patient presents with  . Shortness of Breath      Patient is a 65 y.o. male presenting with shortness of breath. The history is provided by the patient (pt is complaining of SOB). No language interpreter was used.  Shortness of Breath Severity:  Moderate Onset quality:  Sudden Duration:  1 hour Timing:  Constant Progression:  Unchanged Chronicity:  Recurrent Context comment:  After eating Relieved by:  None tried Worsened by:  Deep breathing Ineffective treatments:  None tried Associated symptoms: chest pain   Associated symptoms: no abdominal pain, no cough, no headaches and no rash     Kenneth Merritt is a 65 y.o. male with a medical hx of COPD, A-fib, peripheral vascular dx,  who presents to the Emergency Department complaining of SOB onset 1 hour ago. He states that he has his oxygen on 2.5 L and he just recently had to use more of his oxygen and typically he will only use it PRN. He reports that he was eating prior to the onset of his symptoms. He reports that he has had these symptoms before and he was seen in the ED for this in November 2016 and was dx with A-fib. He states that he has gallstones and he thinks that that is the cause of his CP and that there is no plan as of now for his gallstones. Pt is having associated symptoms of chest tightness and CP. He notes that he has not tried any medications for the relief of his symptoms. He denies any other symptoms.    Past Medical History  Diagnosis Date  . GERD (gastroesophageal reflux disease)   . COPD (chronic obstructive pulmonary disease) ([place])     severe by PFTs August 2010  . 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 Atrial fibrillation (HCC)     post-op a-fib. 3/11. post Aortobifem , /  Atrial fibrillation from nebulizer treatment  May, 2012, while hospitalized  . Renal artery stenosis ([place])      presumably corrected at time of aortobifem surgery March, 2011  . Tobacco user      stop smoking  . Pneumothorax      2011  before aortobifemoral surgery,  resolved  . Peripheral vascular disease, unspecified ([place])     bilateral intermittent claudication  . CAD (coronary artery disease), native coronary artery     catheterization 06/2009.Marland KitchenMarland Kitchen70% proximal LAD and 30% proximal RCA. normal LV function, no intervention planned prior to surgery for aorta. LV normal function  by catheter 8/10.   [name] Dyslipidemia   . DJD (degenerative joint disease)   . Ejection fraction     60%, echo, May, 2012, rapid atrial fib at the time  . Cancer [facility]) 2013    Prostate Radiation  . Myocardial infarction ([place]) 2011  . On home O2     2L N/C   Past Surgical History  Procedure Laterality Date  . Abdominal aortic aneurysm repair  01/11/2010  . Pr vein bypass graft,aorto-fem-pop    . Inguinal hernia repair  RIGHT   Family History  Problem Relation Age of Onset  . Cancer Father     prostate  . Heart attack Father    Social History  Substance Use Topics  . Smoking status: Former Smoker -- 2.00 packs/day for 40 years    Types: Cigarettes    Quit date: 01/11/2010  . Smokeless tobacco: Never Used  . Alcohol Use: No    Review of Systems  Constitutional: Negative for appetite change and fatigue.  HENT: Negative for congestion, ear discharge and sinus pressure.   Eyes: Negative for discharge.  Respiratory: Positive for chest tightness and shortness of breath. Negative for cough.   Cardiovascular: Positive for chest pain.  Gastrointestinal: Negative for abdominal pain and diarrhea.  Genitourinary: Negative for frequency and hematuria.   Musculoskeletal: Negative for back pain.  Skin: Negative for rash.  Neurological: Negative for seizures and headaches.  Psychiatric/Behavioral: Negative for hallucinations.      Allergies  Levaquin  Home Medications   Prior to Admission medications   Medication Sig Start Date End Date Taking? Authorizing Provider  albuterol (PROVENTIL HFA;VENTOLIN HFA) 108 (90 BASE) MCG/ACT inhaler Inhale 2 puffs into the lungs every 4 (four) hours as needed for wheezing or shortness of breath. 09/19/15  Yes Donne [name], MD  albuterol (PROVENTIL) (2.5 MG/3ML) 0.083% nebulizer solution Take 3 mLs (2.5 mg total) by nebulization every 4 (four) hours as needed for wheezing or shortness of breath. 03/15/15  Yes [name], MD  apixaban (ELIQUIS) 5 MG TABS tablet Take 1 tablet (5 mg total) by mouth 2 (two) times daily. 06/20/15  Yes [name], MD  gabapentin (NEURONTIN) 300 MG capsule TAKE 3 CAPSULES BY MOUTH EVERY MORNING, 3CAPS IN THE EVENING AND 2CAPS AT BEDTIME AND CONTINUE 10/30/15  Yes Historical Provider, MD  HYDROcodone-acetaminophen (NORCO/VICODIN) 5-325 MG per tablet Take 1 tablet by mouth every 6 (six) hours as needed for moderate pain.   Yes Historical Provider, MD  ipratropium (ATROVENT) 0.02 % nebulizer solution Take 0.5 mg by nebulization every 4 (four) hours as needed for wheezing or shortness of breath.   Yes Historical Provider, MD  metoprolol (LOPRESSOR) 50 MG tablet Take 50 mg by mouth 2 (two) times daily. 12/27/13  Yes Historical Provider, MD  omeprazole (PRILOSEC) 20 MG capsule Take 1 capsule by mouth every morning.  04/21/11  Yes Historical Provider, MD  predniSONE (DELTASONE) 10 MG tablet Take 10 mg by mouth daily with breakfast.   Yes Historical Provider, MD  simvastatin (ZOCOR) 40 MG tablet TAKE 1 TABLET (40 MG TOTAL) BY MOUTH EVERY EVENING. 08/29/15  Yes [name], MD  lidocaine (XYLOCAINE) 5 % ointment Apply 1 application topically daily as needed for mild pain or  moderate pain.  07/23/15   Historical Provider, MD  nitroGLYCERIN (NITROSTAT) 0.4 MG SL tablet Place 1 tablet (0.4 mg total) under the tongue every 5 (five) minutes x 3 doses as needed for chest pain. 06/11/15   [name], MD  predniSONE (STERAPRED UNI-PAK 21 TAB) 10 MG (21) TBPK tablet Use as directed. Patient not taking: Reported on 11/12/2015 10/04/15   Orvan Falconer, MD   BP 189/89 mmHg  Pulse 111  Resp 32  Ht 5\' 4"  (1.626 m)  Wt 144 lb (65.318 kg)  BMI 24.71 kg/m2  SpO2 99% Physical Exam  Constitutional: He is oriented to person, place, and time. He appears well-developed.  HENT:  Head: Normocephalic.  Eyes: Conjunctivae and EOM are normal. No scleral icterus.  Neck:  Neck supple. No thyromegaly present.  Cardiovascular: Normal rate, regular rhythm and normal heart sounds.  Exam reveals no gallop and no friction rub.   No murmur heard. Pulmonary/Chest: Effort normal. No stridor. He has wheezes. He has no rales. He exhibits no tenderness.  Moderate wheezing bilaterally   Abdominal: Soft. He exhibits no distension. There is no tenderness. There is no rebound.  Musculoskeletal: Normal range of motion. He exhibits no edema.  Lymphadenopathy:    He has no cervical adenopathy.  Neurological: He is oriented to person, place, and time. He exhibits normal muscle tone. Coordination normal.  Skin: No rash noted. No erythema.  Psychiatric: He has a normal mood and affect. His behavior is normal.  Nursing note and vitals reviewed.   ED Course  Procedures (including critical care time) DIAGNOSTIC STUDIES: Oxygen Saturation is 99% on RA, nl by my interpretation.    COORDINATION OF CARE: 9:40 PM Discussed treatment plan with pt at bedside which includes breathing treatment, labs, CXR, EKG and pt agreed to plan.    Labs Review Labs Reviewed  CBC WITH DIFFERENTIAL/PLATELET - Abnormal; Notable for the following:    WBC 11.7 (*)    Neutro Abs 9.1 (*)    All other components within normal  limits  BASIC METABOLIC PANEL - Abnormal; Notable for the following:    Glucose, Bld 160 (*)    Creatinine, Ser 0.58 (*)    All other components within normal limits  HEPATIC FUNCTION PANEL  LIPASE, BLOOD  TROPONIN I    Imaging Review Dg Chest 2 View  11/12/2015  CLINICAL DATA:  Acute onset of shortness of breath and wheezing. Chronic cough. Initial encounter. EXAM: CHEST  2 VIEW COMPARISON:  Chest radiograph performed 10/02/2015 FINDINGS: The lungs are hyperexpanded, with flattening of the hemidiaphragms, compatible with COPD. Scarring is noted at the lung apices. There is no evidence of pleural effusion or pneumothorax. The heart is normal in size; the mediastinal contour is within normal limits. No acute osseous abnormalities are seen. IMPRESSION: Findings of COPD, with scarring at the lung apices. No acute focal airspace consolidation seen. Electronically Signed   By: Garald Balding M.D.   On: 11/12/2015 21:51   I have personally reviewed and evaluated these images and lab results as part of my medical decision-making.   EKG Interpretation   Date/Time:  Monday November 12 2015 21:03:05 EST Ventricular Rate:  107 PR Interval:  112 QRS Duration: 119 QT Interval:  361 QTC Calculation: 482 R Axis:   28 Text Interpretation:  Age not entered, assumed to be  65 years old for  purpose of ECG interpretation Sinus tachycardia Ventricular premature  complex Nonspecific intraventricular conduction delay Artifact in lead(s)  I II III aVR aVL aVF V1 V2 V3 V4 V5 V6 and baseline wander in lead(s) I  III aVL V3 V4 V5 V6 Interpretation limited secondary to artifact Confirmed  by ZACKOWSKI  MD, SCOTT [id number]) on 11/12/2015 9:10:57 PM      MDM   Final diagnoses:  None    COPD exacerbation we'll admit to medicine.   The chart was scribed for me under my direct supervision.  I personally performed the history, physical, and medical decision making and all procedures in the evaluation of this  patient.Milton Ferguson, MD 11/12/15 2337

## 2015-11-13 ENCOUNTER — Encounter (HOSPITAL_COMMUNITY): Payer: Self-pay | Admitting: *Deleted

## 2015-11-13 DIAGNOSIS — J441 Chronic obstructive pulmonary disease with (acute) exacerbation: Secondary | ICD-10-CM | POA: Diagnosis not present

## 2015-11-13 DIAGNOSIS — I480 Paroxysmal atrial fibrillation: Secondary | ICD-10-CM

## 2015-11-13 DIAGNOSIS — J9611 Chronic respiratory failure with hypoxia: Secondary | ICD-10-CM

## 2015-11-13 DIAGNOSIS — Z95828 Presence of other vascular implants and grafts: Secondary | ICD-10-CM | POA: Diagnosis not present

## 2015-11-13 DIAGNOSIS — K219 Gastro-esophageal reflux disease without esophagitis: Secondary | ICD-10-CM

## 2015-11-13 LAB — TROPONIN I

## 2015-11-13 MED ORDER — PREDNISONE 10 MG PO TABS: ORAL_TABLET | ORAL | Status: DC

## 2015-11-13 MED ORDER — SODIUM CHLORIDE 0.9 % IJ SOLN
3.0000 mL | INTRAMUSCULAR | Status: DC | PRN
Start: 2015-11-13 — End: 2015-11-13

## 2015-11-13 MED ORDER — METOPROLOL TARTRATE 50 MG PO TABS
50.0000 mg | ORAL_TABLET | Freq: Two times a day (BID) | ORAL | Status: DC
  Administered 2015-11-13 (×2): 50 mg via ORAL
  Filled 2015-11-13 (×2): qty 1

## 2015-11-13 MED ORDER — APIXABAN 5 MG PO TABS
5.0000 mg | ORAL_TABLET | Freq: Two times a day (BID) | ORAL | Status: DC
Start: 2015-11-13 — End: 2015-11-13
  Administered 2015-11-13 (×2): 5 mg via ORAL
  Filled 2015-11-13 (×2): qty 1

## 2015-11-13 MED ORDER — AZITHROMYCIN 250 MG PO TABS: ORAL_TABLET | ORAL | Status: DC

## 2015-11-13 MED ORDER — CETYLPYRIDINIUM CHLORIDE 0.05 % MT LIQD: 7.0000 mL | Freq: Two times a day (BID) | OROMUCOSAL | Status: DC

## 2015-11-13 MED ORDER — HYDROCODONE-ACETAMINOPHEN 5-325 MG PO TABS
1.0000 | ORAL_TABLET | Freq: Four times a day (QID) | ORAL | Status: DC | PRN
  Administered 2015-11-13: 1 via ORAL
  Filled 2015-11-13: qty 1

## 2015-11-13 MED ORDER — CHLORHEXIDINE GLUCONATE 0.12 % MT SOLN
15.0000 mL | Freq: Two times a day (BID) | OROMUCOSAL | Status: DC
  Administered 2015-11-13: 15 mL via OROMUCOSAL
  Filled 2015-11-13: qty 15

## 2015-11-13 MED ORDER — LEVALBUTEROL HCL 0.63 MG/3ML IN NEBU
0.6300 mg | INHALATION_SOLUTION | Freq: Four times a day (QID) | RESPIRATORY_TRACT | Status: DC
  Administered 2015-11-13 (×2): 0.63 mg via RESPIRATORY_TRACT
  Filled 2015-11-13 (×2): qty 3

## 2015-11-13 MED ORDER — SODIUM CHLORIDE 0.9 % IV SOLN: 250.0000 mL | INTRAVENOUS | Status: DC | PRN

## 2015-11-13 MED ORDER — SODIUM CHLORIDE 0.9 % IJ SOLN
3.0000 mL | Freq: Two times a day (BID) | INTRAMUSCULAR | Status: DC
  Administered 2015-11-13: 3 mL via INTRAVENOUS

## 2015-11-13 MED ORDER — GUAIFENESIN-DM 100-10 MG/5ML PO SYRP: 5.0000 mL | ORAL_SOLUTION | ORAL | Status: DC | PRN

## 2015-11-13 MED ORDER — IPRATROPIUM BROMIDE 0.02 % IN SOLN
0.5000 mg | Freq: Four times a day (QID) | RESPIRATORY_TRACT | Status: DC
  Administered 2015-11-13 (×2): 0.5 mg via RESPIRATORY_TRACT
  Filled 2015-11-13 (×2): qty 2.5

## 2015-11-13 MED ORDER — LEVALBUTEROL HCL 0.63 MG/3ML IN NEBU: 0.6300 mg | INHALATION_SOLUTION | Freq: Four times a day (QID) | RESPIRATORY_TRACT | Status: DC | PRN

## 2015-11-13 MED ORDER — AZITHROMYCIN 250 MG PO TABS
500.0000 mg | ORAL_TABLET | Freq: Every day | ORAL | Status: AC
  Administered 2015-11-13: 500 mg via ORAL
  Filled 2015-11-13: qty 2

## 2015-11-13 MED ORDER — METHYLPREDNISOLONE SODIUM SUCC 125 MG IJ SOLR
80.0000 mg | Freq: Two times a day (BID) | INTRAMUSCULAR | Status: DC
  Administered 2015-11-13: 80 mg via INTRAVENOUS
  Filled 2015-11-13: qty 2

## 2015-11-13 MED ORDER — AZITHROMYCIN 250 MG PO TABS: 250.0000 mg | ORAL_TABLET | Freq: Every day | ORAL | Status: DC

## 2015-11-13 NOTE — Progress Notes (Signed)
Patient with orders to be discharge home. Discharge instructions given, patient verbalized understanding. Prescriptions given. Patient stable. Patient left in private vehicle with family.  

## 2015-11-13 NOTE — Progress Notes (Addendum)
Called to room by patient, states not feeling well, audible wheeze, holding mid chest, vitals taken and recorded.  Hospitalist informed.  Waiting reply. Breathing treatment offered, patient refused at this time.  Will continue to monitor.

## 2015-11-13 NOTE — H&P (Signed)
PCP:   Neale Burly, MD   Chief Complaint:  Sob, wheezing for a week  HPI: 65 yo male h/o copd on 2.5 liter per [place] at home chronically, afib, RAS, CAD comes in with a week of uri symptoms including nasal congestion coughing, and worsening sob over the last week.  He increased his steroids to 20mg  prednisone a day several days ago, using freq nebulizer treatments and still getting worse.  No fevers.  No n/v/d.  Referred for admission for copde. Says he is feeling some better since arrival to the ED, but still not to baseline.  Review of Systems:  Positive and negative as per HPI otherwise all other systems are negative  Past Medical History: Past Medical History  Diagnosis Date  . GERD (gastroesophageal reflux disease)   . COPD (chronic obstructive pulmonary disease) ([place])     severe by PFTs August 2010  . 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 Atrial fibrillation (HCC)     post-op a-fib. 3/11. post Aortobifem , /  Atrial fibrillation from nebulizer treatment  May, 2012, while hospitalized  . Renal artery stenosis ([place])      presumably corrected at time of aortobifem surgery March, 2011  . Tobacco user      stop smoking  . Pneumothorax      2011  before aortobifemoral surgery,  resolved  . Peripheral vascular disease, unspecified ([place])     bilateral intermittent claudication  . CAD (coronary artery disease), native coronary artery     catheterization 06/2009.Marland KitchenMarland Kitchen70% proximal LAD and 30% proximal RCA. normal LV function, no intervention planned prior to surgery for aorta. LV normal function  by catheter 8/10.   [name] Dyslipidemia   . DJD (degenerative joint disease)   . Ejection fraction     60%, echo, May, 2012, rapid atrial fib at the time  . Cancer [facility]) 2013    Prostate Radiation  . Myocardial infarction ([place]) 2011  . On home O2     2L N/[place]   Past Surgical History  Procedure Laterality  Date  . Abdominal aortic aneurysm repair  01/11/2010  . Pr vein bypass graft,aorto-fem-pop    . Inguinal hernia repair      RIGHT    Medications: Prior to Admission medications   Medication Sig Start Date End Date Taking? Authorizing Provider  albuterol (PROVENTIL HFA;VENTOLIN HFA) 108 (90 BASE) MCG/ACT inhaler Inhale 2 puffs into the lungs every 4 (four) hours as needed for wheezing or shortness of breath. 09/19/15  Yes Donne [name], MD  albuterol (PROVENTIL) (2.5 MG/3ML) 0.083% nebulizer solution Take 3 mLs (2.5 mg total) by nebulization every 4 (four) hours as needed for wheezing or shortness of breath. 03/15/15  Yes [name], MD  apixaban (ELIQUIS) 5 MG TABS tablet Take 1 tablet (5 mg total) by mouth 2 (two) times daily. 06/20/15  Yes [name], MD  gabapentin (NEURONTIN) 300 MG capsule TAKE 3 CAPSULES BY MOUTH EVERY MORNING, 3CAPS IN THE EVENING AND 2CAPS AT BEDTIME AND CONTINUE 10/30/15  Yes Historical Provider, MD  HYDROcodone-acetaminophen (NORCO/VICODIN) 5-325 MG per tablet Take 1 tablet by mouth every 6 (six) hours as needed for moderate pain.   Yes Historical Provider, MD  ipratropium (ATROVENT) 0.02 % nebulizer solution Take 0.5 mg by nebulization every 4 (four) hours as needed for wheezing or shortness of breath.   Yes  Historical Provider, MD  metoprolol (LOPRESSOR) 50 MG tablet Take 50 mg by mouth 2 (two) times daily. 12/27/13  Yes Historical Provider, MD  omeprazole (PRILOSEC) 20 MG capsule Take 1 capsule by mouth every morning.  04/21/11  Yes Historical Provider, MD  predniSONE (DELTASONE) 10 MG tablet Take 10 mg by mouth daily with breakfast.   Yes Historical Provider, MD  simvastatin (ZOCOR) 40 MG tablet TAKE 1 TABLET (40 MG TOTAL) BY MOUTH EVERY EVENING. 08/29/15  Yes [name], MD  lidocaine (XYLOCAINE) 5 % ointment Apply 1 application topically daily as needed for mild pain or moderate pain.  07/23/15   Historical Provider, MD  nitroGLYCERIN (NITROSTAT) 0.4 MG  SL tablet Place 1 tablet (0.4 mg total) under the tongue every 5 (five) minutes x 3 doses as needed for chest pain. 06/11/15   [name], MD  predniSONE (STERAPRED UNI-PAK 21 TAB) 10 MG (21) TBPK tablet Use as directed. Patient not taking: Reported on 11/12/2015 10/04/15   Orvan Falconer, MD    Allergies:   Allergies  Allergen Reactions  . Levaquin [Levofloxacin In D5w]     Pt states it caused dry mouth and rectal bleeding.     Social History:  reports that he quit smoking about 5 years ago. His smoking use included Cigarettes. He has a 80 pack-year smoking history. He has never used smokeless tobacco. He reports that he does not drink alcohol or use illicit drugs.  Family History: Family History  Problem Relation Age of Onset  . Cancer Father     prostate  . Heart attack Father     Physical Exam: Filed Vitals:   11/12/15 2230 11/12/15 2300 11/12/15 2307 11/13/15 0030  BP: 115/71 129/70 129/70 139/74  Pulse: 92 88 93 79  TempSrc:      Resp: 17 19 20 15   Height:      Weight:      SpO2: 100% 99% 99% 99%   General appearance: alert, cooperative and mild distress Head: Normocephalic, without obvious abnormality, atraumatic Eyes: negative Nose: Nares normal. Septum midline. Mucosa normal. No drainage or sinus tenderness. Neck: no JVD and supple, symmetrical, trachea midline Lungs: diminished breath sounds bilaterally and wheezes bilaterally Heart: regular rate and rhythm, S1, S2 normal, no murmur, click, rub or gallop Abdomen: soft, non-tender; bowel sounds normal; no masses,  no organomegaly Extremities: extremities normal, atraumatic, no cyanosis or edema Pulses: 2+ and symmetric Skin: Skin color, texture, turgor normal. No rashes or lesions Neurologic: Grossly normal   Labs on Admission:   Recent Labs  11/12/15 2104  NA 143  K 4.3  CL 104  CO2 29  GLUCOSE 160*  BUN 10  CREATININE 0.58*  CALCIUM 9.3    Recent Labs  11/12/15 2104  AST 28  ALT 27  ALKPHOS 57   BILITOT 0.7  PROT 6.9  ALBUMIN 3.8    Recent Labs  11/12/15 2104  LIPASE 30    Recent Labs  11/12/15 2104  WBC 11.7*  NEUTROABS 9.1*  HGB 13.7  HCT 43.1  MCV 91.9  PLT 210    Recent Labs  11/12/15 2104  TROPONINI <0.03   Radiological Exams on Admission: Dg Chest 2 View  11/12/2015  CLINICAL DATA:  Acute onset of shortness of breath and wheezing. Chronic cough. Initial encounter. EXAM: CHEST  2 VIEW COMPARISON:  Chest radiograph performed 10/02/2015 FINDINGS: The lungs are hyperexpanded, with flattening of the hemidiaphragms, compatible with COPD. Scarring is noted at the lung apices.  There is no evidence of pleural effusion or pneumothorax. The heart is normal in size; the mediastinal contour is within normal limits. No acute osseous abnormalities are seen. IMPRESSION: Findings of COPD, with scarring at the lung apices. No acute focal airspace consolidation seen. Electronically Signed   By: Garald Balding M.D.   On: 11/12/2015 21:51   Old chart reviewed Case discussed with dr zammit cxr reviewed no infiltrate or edema   Assessment/Plan  65 yo male with copde and URI  Principal Problem:   COPD exacerbation ([place])-  Likely due to underlying URI.  Mucinex.  Solumedrol.  freq nebs.  Zpack.  Oxygen sats good at his 2.5 liters.  Symptomatic treatment for uri.  Active Problems:  All stable unless o/w noted   S/P aortobifemoral bypass surgery   Renal artery stenosis (HCC)   CAD (coronary artery disease), native coronary artery   PAF (paroxysmal atrial fibrillation) ([place])   Chronic respiratory failure (HCC)  obs on med surg.  Full code.  Jaylianna Tatlock A 11/13/2015, 12:35 AM

## 2015-11-13 NOTE — Care Management Note (Signed)
Case Management Note  Patient Details  Name: Kenneth Merritt MRN: [id number] Date of Birth: 21-Aug-1951  Subjective/Objective:                  Pt admitted from home with COPD exacerbation. Pt lives with his wife and son and will return home at discharge. Pt is independent with ADL's. Pt has home O2 with [facility] and neb machine.  Action/Plan: Pt for discharge home today. No CM needs noted.  Expected Discharge Date:                  Expected Discharge Plan:  Home/Self Care  In-House Referral:  NA  Discharge planning Services  CM Consult  Post Acute Care Choice:  NA Choice offered to:  NA  DME Arranged:    DME Agency:     HH Arranged:    HH Agency:     Status of Service:  Completed, signed off  Medicare Important Message Given:    Date Medicare IM Given:    Medicare IM give by:    Date Additional Medicare IM Given:    Additional Medicare Important Message give by:     If discussed at [place] of Stay Meetings, dates discussed:    Additional Comments:  Joylene Draft, RN 11/13/2015, 10:47 AM

## 2015-11-13 NOTE — Discharge Summary (Signed)
Physician Discharge Summary  Kenneth Merritt [id number] DOB: 1951/03/11 DOA: 11/12/2015  PCP: Neale Burly, MD  Admit date: 11/12/2015 Discharge date: 11/13/2015  Time spent: 35 minutes  Recommendations for Outpatient Follow-up:  1. Follow up with PCP within 1-2 weeks.  2. Outpatient referral to Dr. Luan Pulling, pulmonology.     Discharge Diagnoses:  Principal Problem:   COPD exacerbation ([place]) Active Problems:   GERD (gastroesophageal reflux disease)   S/P aortobifemoral bypass surgery   Renal artery stenosis (HCC)   CAD (coronary artery disease), native coronary artery   PAF (paroxysmal atrial fibrillation) ([place])   Chronic respiratory failure ([place])   Discharge Condition: Improved  Diet recommendation: Heart healthy   Filed Weights   11/12/15 2056 11/13/15 0100  Weight: 65.318 kg (144 lb) 65.998 kg (145 lb 8 oz)    History of present illness:  88 yom presented with complaints of increasing SOB, cough and nasal congestion. It is noted he increased outpatient prednisone tp 20mg  several days ag and used frequent nebs with no relief. He was admitted for management of COPD exacerbation.   Hospital Course:  COPD exacerbation with underlying URI, has been symptomatically treated with Solumedrol, frequent nebs, Zpack and Mucinex. He quickly improved and his breathing is back to baseline, 2L [place]. He is stable for discharge. He is able to ambulate without difficulty and feels his SOB has improved to baseline. He will be transitioned to Predisone taper and will continue chronic Predisone once taper is complete. He will be referred back to his pulmonologist, Dr. Luan Pulling for follow up/  1.   S/P aortobifemoral bypass surgery, stable 2.   Renal artery stenosis, stable. 3.   CAD (coronary artery disease), native coronary artery, stable.  4.   PAF (paroxysmal atrial fibrillation, stable.  5.   Chronic respiratory failure, at baseline 2L [place]    Procedures:  None   Consultations:  None    Discharge Exam: Filed Vitals:   11/13/15 0500 11/13/15 0738  BP: 130/77   Pulse: 82 80  Temp: 97.4 F (36.3 C)   Resp: 24 22     General: NAD, looks comfortable  Cardiovascular: RRR, S1, S2   Respiratory: clear bilaterally, No wheezing, rales or rhonchi  Abdomen: soft, non tender, no distention , bowel sounds normal  Musculoskeletal: No edema b/l  Discharge Instructions   Discharge Instructions    Diet - low sodium heart healthy    Complete by:  As directed      Increase activity slowly    Complete by:  As directed           Current Discharge Medication List    START taking these medications   Details  azithromycin (ZITHROMAX) 250 MG tablet 1 tab po daily for 4 days Qty: 4 each, Refills: 0      CONTINUE these medications which have CHANGED   Details  predniSONE (DELTASONE) 10 MG tablet Take 60mg  daily for 2 days then 40mg  daily for 2 days then 30mg  daily for 2 days then 20mg  daily for 2 days then 10mg  daily Qty: 32 tablet, Refills: 0      CONTINUE these medications which have NOT CHANGED   Details  albuterol (PROVENTIL HFA;VENTOLIN HFA) 108 (90 BASE) MCG/ACT inhaler Inhale 2 puffs into the lungs every 4 (four) hours as needed for wheezing or shortness of breath. Qty: 1 Inhaler, Refills: 0    albuterol (PROVENTIL) (2.5 MG/3ML) 0.083% nebulizer solution Take 3 mLs (2.5 mg total) by nebulization every 4 (  four) hours as needed for wheezing or shortness of breath. Qty: 75 mL, Refills: 12    apixaban (ELIQUIS) 5 MG TABS tablet Take 1 tablet (5 mg total) by mouth 2 (two) times daily. Qty: 60 tablet, Refills: 6    gabapentin (NEURONTIN) 300 MG capsule TAKE 3 CAPSULES BY MOUTH EVERY MORNING, 3CAPS IN THE EVENING AND 2CAPS AT BEDTIME AND CONTINUE    HYDROcodone-acetaminophen (NORCO/VICODIN) 5-325 MG per tablet Take 1 tablet by mouth every 6 (six) hours as needed for moderate pain.    ipratropium (ATROVENT) 0.02 % nebulizer solution Take 0.5 mg by nebulization  every 4 (four) hours as needed for wheezing or shortness of breath.    metoprolol (LOPRESSOR) 50 MG tablet Take 50 mg by mouth 2 (two) times daily.    omeprazole (PRILOSEC) 20 MG capsule Take 1 capsule by mouth every morning.     simvastatin (ZOCOR) 40 MG tablet TAKE 1 TABLET (40 MG TOTAL) BY MOUTH EVERY EVENING. Qty: 30 tablet, Refills: 9    nitroGLYCERIN (NITROSTAT) 0.4 MG SL tablet Place 1 tablet (0.4 mg total) under the tongue every 5 (five) minutes x 3 doses as needed for chest pain. Qty: 25 tablet, Refills: 3      STOP taking these medications     lidocaine (XYLOCAINE) 5 % ointment      predniSONE (STERAPRED UNI-PAK 21 TAB) 10 MG (21) TBPK tablet        Allergies  Allergen Reactions  . Levaquin [Levofloxacin In D5w]     Pt states it caused dry mouth and rectal bleeding.    Follow-up Information    Follow up with Neale Burly, MD On 11/16/2015.   Specialty:  Internal Medicine   Why:  at 4:15 pm   Contact information:   [address] [place] [id number] [phone]        The results of significant diagnostics from this hospitalization (including imaging, microbiology, ancillary and laboratory) are listed below for reference.    Significant Diagnostic Studies: Dg Chest 2 View  11/12/2015  CLINICAL DATA:  Acute onset of shortness of breath and wheezing. Chronic cough. Initial encounter. EXAM: CHEST  2 VIEW COMPARISON:  Chest radiograph performed 10/02/2015 FINDINGS: The lungs are hyperexpanded, with flattening of the hemidiaphragms, compatible with COPD. Scarring is noted at the lung apices. There is no evidence of pleural effusion or pneumothorax. The heart is normal in size; the mediastinal contour is within normal limits. No acute osseous abnormalities are seen. IMPRESSION: Findings of COPD, with scarring at the lung apices. No acute focal airspace consolidation seen. Electronically Signed   By: Garald Balding M.D.   On: 11/12/2015 21:51    Microbiology: No  results found for this or any previous visit (from the past 240 hour(s)).   Labs: Basic Metabolic Panel:  Recent Labs Lab 11/12/15 2104  NA 143  K 4.3  CL 104  CO2 29  GLUCOSE 160*  BUN 10  CREATININE 0.58*  CALCIUM 9.3   Liver Function Tests:  Recent Labs Lab 11/12/15 2104  AST 28  ALT 27  ALKPHOS 57  BILITOT 0.7  PROT 6.9  ALBUMIN 3.8    Recent Labs Lab 11/12/15 2104  LIPASE 30   No results for input(s): AMMONIA in the last 168 hours. CBC:  Recent Labs Lab 11/12/15 2104  WBC 11.7*  NEUTROABS 9.1*  HGB 13.7  HCT 43.1  MCV 91.9  PLT 210   Cardiac Enzymes:  Recent Labs Lab 11/12/15 2104  11/13/15 0700  TROPONINI <0.03 <0.03   BNP: BNP (last 3 results)  Recent Labs  07/03/15 0345 08/02/15 1932 10/02/15 2130  BNP 28.0 42.0 80.0      Signed:  [name], MD   [facility] 11/13/2015, 10:28 AM   . By signing my name below, I, Rennis Harding, attest that this documentation has been prepared under the direction and in the presence of [name], MD. Electronically signed: Rennis Harding, Scribe. 11/13/2015 10:10  I, Dr. [name], personally performed the services described in this documentaiton. All medical record entries made by the scribe were at my direction and in my presence. I have reviewed the chart and agree that the record reflects my personal performance and is accurate and complete  [name], MD, 11/13/2015 10:28 AM

## 2015-11-13 NOTE — Progress Notes (Signed)
Hospitalist returned call, ordered stat troponin, informed MD that chest discomfort was easing, stat tropinin entered, waiting results.

## 2015-11-13 NOTE — Progress Notes (Signed)
Patient in bathroom, returned to bed, SOB with audible wheezing, respiratory called for prn neb.  Will continue to monitor.

## 2015-11-13 NOTE — Care Management Obs Status (Signed)
[place] NOTIFICATION   Patient Details  Name: Kenneth Merritt MRN: [id number] Date of Birth: 08-15-1951   Medicare Observation Status Notification Given:  Other (see comment)  Pt discharged within 24 hours. No OBS form given.  Christinia Gully [place], RN 11/13/2015, 10:46 AM

## 2016-01-08 ENCOUNTER — Emergency Department (HOSPITAL_COMMUNITY): Payer: Medicare Other

## 2016-01-08 ENCOUNTER — Observation Stay (HOSPITAL_COMMUNITY)
Admission: EM | Admit: 2016-01-08 | Discharge: 2016-01-10 | Disposition: A | Discharge status: Home / Self Care | Payer: Medicare Other | Attending: Family Medicine | Admitting: Family Medicine

## 2016-01-08 ENCOUNTER — Encounter (HOSPITAL_COMMUNITY): Payer: Self-pay | Admitting: Emergency Medicine

## 2016-01-08 DIAGNOSIS — Z7901 Long term (current) use of anticoagulants: Secondary | ICD-10-CM | POA: Insufficient documentation

## 2016-01-08 DIAGNOSIS — J441 Chronic obstructive pulmonary disease with (acute) exacerbation: Secondary | ICD-10-CM | POA: Diagnosis not present

## 2016-01-08 DIAGNOSIS — K219 Gastro-esophageal reflux disease without esophagitis: Secondary | ICD-10-CM | POA: Insufficient documentation

## 2016-01-08 DIAGNOSIS — I2510 Atherosclerotic heart disease of native coronary artery without angina pectoris: Secondary | ICD-10-CM | POA: Diagnosis not present

## 2016-01-08 DIAGNOSIS — I4891 Unspecified atrial fibrillation: Secondary | ICD-10-CM | POA: Diagnosis not present

## 2016-01-08 DIAGNOSIS — E785 Hyperlipidemia, unspecified: Secondary | ICD-10-CM | POA: Diagnosis not present

## 2016-01-08 DIAGNOSIS — R0602 Shortness of breath: Secondary | ICD-10-CM | POA: Insufficient documentation

## 2016-01-08 DIAGNOSIS — Z79899 Other long term (current) drug therapy: Secondary | ICD-10-CM | POA: Insufficient documentation

## 2016-01-08 DIAGNOSIS — Z95828 Presence of other vascular implants and grafts: Secondary | ICD-10-CM

## 2016-01-08 DIAGNOSIS — Z87891 Personal history of nicotine dependence: Secondary | ICD-10-CM | POA: Insufficient documentation

## 2016-01-08 DIAGNOSIS — J449 Chronic obstructive pulmonary disease, unspecified: Secondary | ICD-10-CM | POA: Insufficient documentation

## 2016-01-08 DIAGNOSIS — I480 Paroxysmal atrial fibrillation: Secondary | ICD-10-CM | POA: Diagnosis not present

## 2016-01-08 LAB — CBC WITH DIFFERENTIAL/PLATELET
Basophils Absolute: 0 10*3/uL (ref 0.0–0.1)
Basophils Relative: 0 %
EOS ABS: 0 10*3/uL (ref 0.0–0.7)
EOS PCT: 0 %
HCT: 45.2 % (ref 39.0–52.0)
HEMOGLOBIN: 14 g/dL (ref 13.0–17.0)
LYMPHS ABS: 1.4 10*3/uL (ref 0.7–4.0)
Lymphocytes Relative: 14 %
MCH: 28.9 pg (ref 26.0–34.0)
MCHC: 31 g/dL (ref 30.0–36.0)
MCV: 93.4 fL (ref 78.0–100.0)
MONOS PCT: 10 %
Monocytes Absolute: 1 10*3/uL (ref 0.1–1.0)
Neutro Abs: 7.8 10*3/uL — ABNORMAL HIGH (ref 1.7–7.7)
Neutrophils Relative %: 76 %
PLATELETS: 242 10*3/uL (ref 150–400)
RBC: 4.84 MIL/uL (ref 4.22–5.81)
RDW: 14.3 % (ref 11.5–15.5)
WBC: 10.3 10*3/uL (ref 4.0–10.5)

## 2016-01-08 LAB — BASIC METABOLIC PANEL
ANION GAP: 6 (ref 5–15)
BUN: 9 mg/dL (ref 6–20)
CALCIUM: 9.6 mg/dL (ref 8.9–10.3)
CHLORIDE: 106 mmol/L (ref 101–111)
CO2: 32 mmol/L (ref 22–32)
Creatinine, Ser: 0.72 mg/dL (ref 0.61–1.24)
Glucose, Bld: 132 mg/dL — ABNORMAL HIGH (ref 65–99)
Potassium: 4.6 mmol/L (ref 3.5–5.1)
Sodium: 144 mmol/L (ref 135–145)

## 2016-01-08 LAB — MRSA PCR SCREENING: MRSA BY PCR: NEGATIVE

## 2016-01-08 LAB — BRAIN NATRIURETIC PEPTIDE: B Natriuretic Peptide: 149 pg/mL — ABNORMAL HIGH (ref 0.0–100.0)

## 2016-01-08 LAB — TROPONIN I
Troponin I: <0.03 ng/mL (ref <0.031)
Troponin I: <0.03 ng/mL (ref <0.031)

## 2016-01-08 MED ORDER — SODIUM CHLORIDE 0.9% FLUSH
3.0000 mL | Freq: Two times a day (BID) | INTRAVENOUS | Status: DC
  Administered 2016-01-08 – 2016-01-10 (×3): 3 mL via INTRAVENOUS

## 2016-01-08 MED ORDER — SODIUM CHLORIDE 0.9 % IV BOLUS (SEPSIS)
250.0000 mL | Freq: Once | INTRAVENOUS | Status: AC
  Administered 2016-01-08: 250 mL via INTRAVENOUS

## 2016-01-08 MED ORDER — ALBUTEROL (5 MG/ML) CONTINUOUS INHALATION SOLN
10.0000 mg/h | INHALATION_SOLUTION | Freq: Once | RESPIRATORY_TRACT | Status: AC
  Administered 2016-01-08: 10 mg/h via RESPIRATORY_TRACT
  Filled 2016-01-08: qty 20

## 2016-01-08 MED ORDER — ALPRAZOLAM 0.25 MG PO TABS
0.2500 mg | ORAL_TABLET | Freq: Every day | ORAL | Status: DC
  Administered 2016-01-08 – 2016-01-09 (×2): 0.25 mg via ORAL
  Filled 2016-01-08 (×2): qty 1

## 2016-01-08 MED ORDER — MORPHINE SULFATE (PF) 4 MG/ML IV SOLN
4.0000 mg | INTRAVENOUS | Status: DC | PRN
  Administered 2016-01-08: 4 mg via SUBCUTANEOUS
  Filled 2016-01-08: qty 1

## 2016-01-08 MED ORDER — ATORVASTATIN CALCIUM 20 MG PO TABS
20.0000 mg | ORAL_TABLET | Freq: Every day | ORAL | Status: DC
Start: 2016-01-08 — End: 2016-01-10
  Administered 2016-01-08 – 2016-01-09 (×2): 20 mg via ORAL
  Filled 2016-01-08 (×2): qty 1

## 2016-01-08 MED ORDER — SIMVASTATIN 20 MG PO TABS: 40.0000 mg | ORAL_TABLET | Freq: Every day | ORAL | Status: DC

## 2016-01-08 MED ORDER — DIGOXIN 0.25 MG/ML IJ SOLN
0.5000 mg | Freq: Once | INTRAMUSCULAR | Status: AC
  Administered 2016-01-08: 0.5 mg via INTRAVENOUS
  Filled 2016-01-08: qty 2

## 2016-01-08 MED ORDER — LEVALBUTEROL HCL 1.25 MG/0.5ML IN NEBU: 1.2500 mg | INHALATION_SOLUTION | Freq: Once | RESPIRATORY_TRACT | Status: DC

## 2016-01-08 MED ORDER — METOPROLOL TARTRATE 50 MG PO TABS
50.0000 mg | ORAL_TABLET | Freq: Two times a day (BID) | ORAL | Status: DC
  Administered 2016-01-08 – 2016-01-10 (×4): 50 mg via ORAL
  Filled 2016-01-08 (×4): qty 1

## 2016-01-08 MED ORDER — HYDROCORTISONE NA SUCCINATE PF 100 MG IJ SOLR
50.0000 mg | Freq: Three times a day (TID) | INTRAMUSCULAR | Status: DC
  Administered 2016-01-08 – 2016-01-09 (×2): 50 mg via INTRAVENOUS
  Filled 2016-01-08 (×2): qty 2

## 2016-01-08 MED ORDER — DILTIAZEM LOAD VIA INFUSION
15.0000 mg | Freq: Once | INTRAVENOUS | Status: AC
  Administered 2016-01-08: 15 mg via INTRAVENOUS

## 2016-01-08 MED ORDER — LEVALBUTEROL HCL 0.63 MG/3ML IN NEBU
0.6300 mg | INHALATION_SOLUTION | Freq: Four times a day (QID) | RESPIRATORY_TRACT | Status: DC
  Administered 2016-01-08 – 2016-01-10 (×8): 0.63 mg via RESPIRATORY_TRACT
  Filled 2016-01-08 (×8): qty 3

## 2016-01-08 MED ORDER — HYDROCODONE-ACETAMINOPHEN 5-325 MG PO TABS
1.0000 | ORAL_TABLET | Freq: Four times a day (QID) | ORAL | Status: DC | PRN
  Administered 2016-01-08 – 2016-01-10 (×4): 1 via ORAL
  Filled 2016-01-08 (×4): qty 1

## 2016-01-08 MED ORDER — ALBUTEROL SULFATE (2.5 MG/3ML) 0.083% IN NEBU: 5.0000 mg | INHALATION_SOLUTION | Freq: Once | RESPIRATORY_TRACT | Status: DC

## 2016-01-08 MED ORDER — DILTIAZEM HCL 100 MG IV SOLR: 5.0000 mg/h | INTRAVENOUS | Status: DC

## 2016-01-08 MED ORDER — SODIUM CHLORIDE 0.9 % IV SOLN: INTRAVENOUS | Status: DC

## 2016-01-08 MED ORDER — SODIUM CHLORIDE 0.9 % IV SOLN
INTRAVENOUS | Status: DC
  Administered 2016-01-08: 21:00:00 via INTRAVENOUS

## 2016-01-08 MED ORDER — METHYLPREDNISOLONE SODIUM SUCC 125 MG IJ SOLR
125.0000 mg | Freq: Once | INTRAMUSCULAR | Status: AC
  Administered 2016-01-08: 125 mg via INTRAVENOUS
  Filled 2016-01-08: qty 2

## 2016-01-08 MED ORDER — LEVALBUTEROL HCL 1.25 MG/0.5ML IN NEBU
INHALATION_SOLUTION | RESPIRATORY_TRACT | Status: AC
  Administered 2016-01-08: 1.25 mg
  Filled 2016-01-08: qty 0.5

## 2016-01-08 MED ORDER — ALBUTEROL SULFATE (2.5 MG/3ML) 0.083% IN NEBU: 2.5000 mg | INHALATION_SOLUTION | RESPIRATORY_TRACT | Status: DC

## 2016-01-08 MED ORDER — PANTOPRAZOLE SODIUM 40 MG PO TBEC
40.0000 mg | DELAYED_RELEASE_TABLET | Freq: Every day | ORAL | Status: DC
  Administered 2016-01-09 – 2016-01-10 (×2): 40 mg via ORAL
  Filled 2016-01-08 (×2): qty 1

## 2016-01-08 MED ORDER — GABAPENTIN 300 MG PO CAPS
600.0000 mg | ORAL_CAPSULE | Freq: Two times a day (BID) | ORAL | Status: DC
  Administered 2016-01-08 – 2016-01-10 (×4): 600 mg via ORAL
  Filled 2016-01-08 (×4): qty 2

## 2016-01-08 MED ORDER — IPRATROPIUM BROMIDE 0.02 % IN SOLN
1.0000 mg | Freq: Once | RESPIRATORY_TRACT | Status: AC
  Administered 2016-01-08: 1 mg via RESPIRATORY_TRACT
  Filled 2016-01-08 (×2): qty 5

## 2016-01-08 MED ORDER — APIXABAN 5 MG PO TABS
5.0000 mg | ORAL_TABLET | Freq: Two times a day (BID) | ORAL | Status: DC
  Administered 2016-01-08 – 2016-01-10 (×4): 5 mg via ORAL
  Filled 2016-01-08 (×4): qty 1

## 2016-01-08 MED ORDER — SODIUM CHLORIDE 0.9 % IV BOLUS (SEPSIS)
1000.0000 mL | Freq: Once | INTRAVENOUS | Status: AC
  Administered 2016-01-08: 1000 mL via INTRAVENOUS

## 2016-01-08 MED ORDER — METHYLPREDNISOLONE SODIUM SUCC 40 MG IJ SOLR
40.0000 mg | Freq: Four times a day (QID) | INTRAMUSCULAR | Status: DC
Start: 2016-01-09 — End: 2016-01-09
  Administered 2016-01-08 – 2016-01-09 (×2): 40 mg via INTRAVENOUS
  Filled 2016-01-08 (×2): qty 1

## 2016-01-08 MED ORDER — DIGOXIN 0.25 MG/ML IJ SOLN
0.2500 mg | INTRAMUSCULAR | Status: DC
Start: 2016-01-09 — End: 2016-01-09

## 2016-01-08 MED ORDER — DILTIAZEM LOAD VIA INFUSION
10.0000 mg | Freq: Once | INTRAVENOUS | Status: AC
  Administered 2016-01-08: 10 mg via INTRAVENOUS
  Filled 2016-01-08: qty 10

## 2016-01-08 MED ORDER — DILTIAZEM HCL 100 MG IV SOLR
5.0000 mg/h | INTRAVENOUS | Status: DC
  Administered 2016-01-08: 5 mg/h via INTRAVENOUS
  Filled 2016-01-08 (×3): qty 100

## 2016-01-08 NOTE — H&P (Signed)
[facility] History and Physical  Kenneth Merritt [id number] DOB: May 03, 1951    PCP:   Neale Burly, MD   Chief Complaint: palpitation and chest tightness.   HPI: Kenneth Merritt is an 65 y.o. male with hx of afib on anticoagulation, COPD on ventolin inhaler, s/p Aortofemoral bypass, hx of prior cigarette use, last admitted for afib with RVR, and atypical chest pain,  suspicious due to overuse of inhalers, presented today with similar symptoms of chest tightness, palpitation but no fever, productive cough or myalgia.  He denied tobacco abuse, non compliance, and had no ill contact.  In the ER, he was found again in afib with RVR and wheezing.  He was given IV stereoid and Zopinex, along with IV Cardiazem and started on a drip, and hospitlaist was asked to admit him for afib with RVR, COPD exacerbation.  He has unremarkable serology with normal K, renal fx tests, and no leukocytosis.    Rewiew of Systems:  Constitutional: Negative for malaise, fever and chills. No significant weight loss or weight gain Eyes: Negative for eye pain, redness and discharge, diplopia, visual changes, or flashes of light. ENMT: Negative for ear pain, hoarseness, nasal congestion, sinus pressure and sore throat. No headaches; tinnitus, drooling, or problem swallowing. Cardiovascular: Negative for chest pain, palpitations, diaphoresis, dyspnea and peripheral edema. ; No orthopnea, PND Respiratory: Negative for cough, hemoptysis, wheezing and stridor. No pleuritic chestpain. Gastrointestinal: Negative for nausea, vomiting, diarrhea, constipation, abdominal pain, melena, blood in stool, hematemesis, jaundice and rectal bleeding.    Genitourinary: Negative for frequency, dysuria, incontinence,flank pain and hematuria; Musculoskeletal: Negative for back pain and neck pain. Negative for swelling and trauma.;  Skin: . Negative for pruritus, rash, abrasions, bruising and skin lesion.; ulcerations Neuro: Negative for  headache, lightheadedness and neck stiffness. Negative for weakness, altered level of consciousness , altered mental status, extremity weakness, burning feet, involuntary movement, seizure and syncope.  Psych: negative for anxiety, depression, insomnia, tearfulness, panic attacks, hallucinations, paranoia, suicidal or homicidal ideation    Past Medical History  Diagnosis Date  . GERD (gastroesophageal reflux disease)   . COPD (chronic obstructive pulmonary disease) ([place])     severe by PFTs August 2010  . 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 Atrial fibrillation (HCC)     post-op a-fib. 3/11. post Aortobifem , /  Atrial fibrillation from nebulizer treatment  May, 2012, while hospitalized  . Renal artery stenosis ([place])      presumably corrected at time of aortobifem surgery March, 2011  . Tobacco user      stop smoking  . Pneumothorax      2011  before aortobifemoral surgery,  resolved  . Peripheral vascular disease, unspecified ([place])     bilateral intermittent claudication  . CAD (coronary artery disease), native coronary artery     catheterization 06/2009.Marland KitchenMarland Kitchen70% proximal LAD and 30% proximal RCA. normal LV function, no intervention planned prior to surgery for aorta. LV normal function  by catheter 8/10.   [name] Dyslipidemia   . DJD (degenerative joint disease)   . Ejection fraction     60%, echo, May, 2012, rapid atrial fib at the time  . Cancer [facility]) 2013    Prostate Radiation  . Myocardial infarction ([place]) 2011  . On home O2     2L N/C    Past Surgical History  Procedure Laterality Date  .  Abdominal aortic aneurysm repair  01/11/2010  . Pr vein bypass graft,aorto-fem-pop    . Inguinal hernia repair      RIGHT    Medications:  HOME MEDS: Prior to Admission medications   Medication Sig Start Date End Date Taking? Authorizing Provider  albuterol (PROVENTIL HFA;VENTOLIN HFA) 108 (90  BASE) MCG/ACT inhaler Inhale 2 puffs into the lungs every 4 (four) hours as needed for wheezing or shortness of breath. 09/19/15  Yes Donne [name], MD  albuterol (PROVENTIL) (2.5 MG/3ML) 0.083% nebulizer solution Take 3 mLs (2.5 mg total) by nebulization every 4 (four) hours as needed for wheezing or shortness of breath. 03/15/15  Yes [name], MD  ALPRAZolam [name]) 0.25 MG tablet Take 0.25 mg by mouth at bedtime. 12/17/15  Yes Historical Provider, MD  apixaban (ELIQUIS) 5 MG TABS tablet Take 1 tablet (5 mg total) by mouth 2 (two) times daily. 06/20/15  Yes [name], MD  gabapentin (NEURONTIN) 300 MG capsule Take two capsules by mouth twice daily 10/30/15  Yes Historical Provider, MD  HYDROcodone-acetaminophen (NORCO/VICODIN) 5-325 MG per tablet Take 1 tablet by mouth every 6 (six) hours as needed for moderate pain.   Yes Historical Provider, MD  ipratropium (ATROVENT) 0.02 % nebulizer solution Take 0.5 mg by nebulization every 4 (four) hours as needed for wheezing or shortness of breath.   Yes Historical Provider, MD  metoprolol (LOPRESSOR) 50 MG tablet Take 50 mg by mouth 2 (two) times daily. 12/27/13  Yes Historical Provider, MD  nitroGLYCERIN (NITROSTAT) 0.4 MG SL tablet Place 1 tablet (0.4 mg total) under the tongue every 5 (five) minutes x 3 doses as needed for chest pain. 06/11/15  Yes [name], MD  omeprazole (PRILOSEC) 20 MG capsule Take 1 capsule by mouth every morning.  04/21/11  Yes Historical Provider, MD  predniSONE (DELTASONE) 10 MG tablet Take 60mg  daily for 2 days then 40mg  daily for 2 days then 30mg  daily for 2 days then 20mg  daily for 2 days then 10mg  daily Patient taking differently: Take 10 mg by mouth daily with breakfast.  11/13/15  Yes [name], MD  simvastatin (ZOCOR) 40 MG tablet TAKE 1 TABLET (40 MG TOTAL) BY MOUTH EVERY EVENING. 08/29/15  Yes [name], MD  azithromycin (ZITHROMAX) 250 MG tablet 1 tab po daily for 4 days Patient not taking: Reported on  01/08/2016 11/14/15   [name], MD     Allergies:  Allergies  Allergen Reactions  . Levaquin [Levofloxacin In D5w]     Pt states it caused dry mouth and rectal bleeding.     Social History:   reports that he quit smoking about 5 years ago. His smoking use included Cigarettes. He has a 80 pack-year smoking history. He has never used smokeless tobacco. He reports that he does not drink alcohol or use illicit drugs.  Family History: Family History  Problem Relation Age of Onset  . Cancer Father     prostate  . Heart attack Father      Physical Exam: Filed Vitals:   01/08/16 1800 01/08/16 1830 01/08/16 1900 01/08/16 1901  BP: 91/63 94/68 106/73 113/90  Pulse: 125 136 89 52  Temp:      TempSrc:      Resp: 26 13 26 18   Height:      Weight:      SpO2: 100% 99% 96% 100%   Blood pressure 113/90, pulse 52, temperature 98.7 F (37.1 C), temperature source Oral, resp. rate 18,  height 5\' 8"  (1.727 m), weight 65.772 kg (145 lb), SpO2 100 %.  GEN:  Pleasant patient lying in the stretcher in no acute distress; cooperative with exam. PSYCH:  alert and oriented x4; does not appear anxious or depressed; affect is appropriate. HEENT: Mucous membranes pink and anicteric; PERRLA; EOM intact; no cervical lymphadenopathy nor thyromegaly or carotid bruit; no JVD; There were no stridor. Neck is very supple. Breasts:: Not examined CHEST WALL: No tenderness CHEST: Normal respiration, bilateral wheezing.  No rales.  HEART: tachycardia. There are no murmur, rub, or gallops.   BACK: No kyphosis or scoliosis; no CVA tenderness ABDOMEN: soft and non-tender; no masses, no organomegaly, normal abdominal bowel sounds; no pannus; no intertriginous candida. There is no rebound and no distention. Rectal Exam: Not done EXTREMITIES: No bone or joint deformity; age-appropriate arthropathy of the hands and knees; no edema; no ulcerations.  There is no calf tenderness. Genitalia: not examined PULSES: 2+ and  symmetric SKIN: Normal hydration no rash or ulceration CNS: Cranial nerves 2-12 grossly intact no focal lateralizing neurologic deficit.  Speech is fluent; uvula elevated with phonation, facial symmetry and tongue midline. DTR are normal bilaterally, cerebella exam is intact, barbinski is negative and strengths are equaled bilaterally.  No sensory loss.   Labs on Admission:  Basic Metabolic Panel:  Recent Labs Lab 01/08/16 1707  NA 144  K 4.6  CL 106  CO2 32  GLUCOSE 132*  BUN 9  CREATININE 0.72  CALCIUM 9.6   Liver Function Tests: CBC:  Recent Labs Lab 01/08/16 1707  WBC 10.3  NEUTROABS 7.8*  HGB 14.0  HCT 45.2  MCV 93.4  PLT 242   Cardiac Enzymes:  Recent Labs Lab 01/08/16 1707  TROPONINI <0.03   Radiological Exams on Admission: Dg Chest Port 1 View  01/08/2016  CLINICAL DATA:  Increasing shortness of breath EXAM: PORTABLE CHEST 1 VIEW COMPARISON:  11/20/2015 FINDINGS: Normal heart size. There is no pleural effusion or edema. Advanced chronic interstitial coarsening and hyperinflation is noted bilaterally. Biapical pleural and parenchymal scarring is noted and appears similar to previous exam. No superimposed pulmonary edema or airspace consolidation. IMPRESSION: 1. Advanced changes of COPD. 2. No acute findings identified. Electronically Signed   By: Kerby Moors M.D.   On: 01/08/2016 17:33    EKG: Independently reviewed.   Assessment/Plan Present on Admission:  . COPD with acute exacerbation ([place]) . Atrial fibrillation with rapid ventricular response ([place]) . COPD (chronic obstructive pulmonary disease) ([place]) . CAD (coronary artery disease), native coronary artery . Dyslipidemia . GERD (gastroesophageal reflux disease) . Paroxysmal a-fib (HCC)  PLAN:  Chest pain Likely from demand ischemia from A. fib with RVR, will cycle troponin every 6 hours 3.  Rate control as promptly as possible.  He did bump his troponin last admission.   A. fib with  RVR Patient takes metoprolol at home, he has been started on Cardizem infusion. Will continue Cardizem infusion stepdown unit. Continue anticoagulation with Eliquis. Will continue with betablocker.   COPD Will give IV Steroid along with stress dose steroid.  Start Xopenex nebulizers every 6 hours when necessary. Will avoid albuterol due to A. Fib.   Other plans as per orders. Code Status:FULL CODE.   Orvan Falconer, MD. FACP [facility] Pager [phone] 7pm to 7am.  01/08/2016, 7:35 PM

## 2016-01-08 NOTE — ED Notes (Signed)
MD Thurnell Garbe gave verbal order to continue with 5 mg / hr.

## 2016-01-08 NOTE — ED Notes (Addendum)
Pt reports increased shortness of breath for last several days with severity increasing within last several hours. Moderate dyspnea and accessory muscle use noted in triage. Congested cough noted. Pt reports central chest pain. Pt reports tried several breathing treatments prior to arrival. Last treatment being approximately 1500.

## 2016-01-08 NOTE — ED Notes (Signed)
15 MG bolus Cardizem VO EDP

## 2016-01-08 NOTE — ED Notes (Signed)
Patient provided meal tray at this time.

## 2016-01-08 NOTE — ED Provider Notes (Signed)
CSN: [id number]     Arrival date & time 01/08/16  1653 History   First MD Initiated Contact with Patient 01/08/16 1704     Chief Complaint  Patient presents with  . Shortness of Breath     HPI  Pt was seen at 1710.  Per pt, c/o gradual onset and worsening of persistent cough, wheezing and SOB for the past 1 week, worse today.  Describes his symptoms as "my COPD is acting up."  Has been using home MDI and nebs without relief today. Pt also c/o palpitations and generalized chest tightness that began earlier today, worse over the past several hours.  Denies back pain, no abd pain, no N/V/D, no fevers, no rash.    Past Medical History  Diagnosis Date  . GERD (gastroesophageal reflux disease)   . COPD (chronic obstructive pulmonary disease) ([place])     severe by PFTs August 2010  . 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 Atrial fibrillation (HCC)     post-op a-fib. 3/11. post Aortobifem , /  Atrial fibrillation from nebulizer treatment  May, 2012, while hospitalized  . Renal artery stenosis ([place])      presumably corrected at time of aortobifem surgery March, 2011  . Tobacco user      stop smoking  . Pneumothorax      2011  before aortobifemoral surgery,  resolved  . Peripheral vascular disease, unspecified ([place])     bilateral intermittent claudication  . CAD (coronary artery disease), native coronary artery     catheterization 06/2009.Marland KitchenMarland Kitchen70% proximal LAD and 30% proximal RCA. normal LV function, no intervention planned prior to surgery for aorta. LV normal function  by catheter 8/10.   [name] Dyslipidemia   . DJD (degenerative joint disease)   . Ejection fraction     60%, echo, May, 2012, rapid atrial fib at the time  . Cancer [facility]) 2013    Prostate Radiation  . Myocardial infarction ([place]) 2011  . On home O2     2L N/C   Past Surgical History  Procedure Laterality Date  . Abdominal aortic aneurysm  repair  01/11/2010  . Pr vein bypass graft,aorto-fem-pop    . Inguinal hernia repair      RIGHT   Family History  Problem Relation Age of Onset  . Cancer Father     prostate  . Heart attack Father    Social History  Substance Use Topics  . Smoking status: Former Smoker -- 2.00 packs/day for 40 years    Types: Cigarettes    Quit date: 01/11/2010  . Smokeless tobacco: Never Used  . Alcohol Use: No    Review of Systems ROS: Statement: All systems negative except as marked or noted in the HPI; Constitutional: Negative for fever and chills. ; ; Eyes: Negative for eye pain, redness and discharge. ; ; ENMT: Negative for ear pain, hoarseness, nasal congestion, sinus pressure and sore throat. ; ; Cardiovascular: +CP, palpitations. Negative for diaphoresis, and peripheral edema. ; ; Respiratory: +SOB, cough, wheezing. Negative for stridor. ; ; Gastrointestinal: Negative for nausea, vomiting, diarrhea, abdominal pain, blood in stool, hematemesis, jaundice and rectal bleeding. . ; ; Genitourinary: Negative for dysuria, flank pain and hematuria. ; ; Musculoskeletal: Negative for back pain and neck pain. Negative for swelling and trauma.; ; Skin: Negative for pruritus, rash, abrasions, blisters, bruising and skin lesion.; ; Neuro:  Negative for headache, lightheadedness and neck stiffness. Negative for weakness, altered level of consciousness , altered mental status, extremity weakness, paresthesias, involuntary movement, seizure and syncope.      Allergies  Levaquin  Home Medications   Prior to Admission medications   Medication Sig Start Date End Date Taking? Authorizing Provider  albuterol (PROVENTIL HFA;VENTOLIN HFA) 108 (90 BASE) MCG/ACT inhaler Inhale 2 puffs into the lungs every 4 (four) hours as needed for wheezing or shortness of breath. 09/19/15  Yes Donne [name], MD  albuterol (PROVENTIL) (2.5 MG/3ML) 0.083% nebulizer solution Take 3 mLs (2.5 mg total) by nebulization every 4 (four)  hours as needed for wheezing or shortness of breath. 03/15/15  Yes [name], MD  ALPRAZolam [name]) 0.25 MG tablet Take 0.25 mg by mouth at bedtime. 12/17/15  Yes Historical Provider, MD  apixaban (ELIQUIS) 5 MG TABS tablet Take 1 tablet (5 mg total) by mouth 2 (two) times daily. 06/20/15  Yes [name], MD  gabapentin (NEURONTIN) 300 MG capsule Take two capsules by mouth twice daily 10/30/15  Yes Historical Provider, MD  HYDROcodone-acetaminophen (NORCO/VICODIN) 5-325 MG per tablet Take 1 tablet by mouth every 6 (six) hours as needed for moderate pain.   Yes Historical Provider, MD  ipratropium (ATROVENT) 0.02 % nebulizer solution Take 0.5 mg by nebulization every 4 (four) hours as needed for wheezing or shortness of breath.   Yes Historical Provider, MD  metoprolol (LOPRESSOR) 50 MG tablet Take 50 mg by mouth 2 (two) times daily. 12/27/13  Yes Historical Provider, MD  nitroGLYCERIN (NITROSTAT) 0.4 MG SL tablet Place 1 tablet (0.4 mg total) under the tongue every 5 (five) minutes x 3 doses as needed for chest pain. 06/11/15  Yes [name], MD  omeprazole (PRILOSEC) 20 MG capsule Take 1 capsule by mouth every morning.  04/21/11  Yes Historical Provider, MD  predniSONE (DELTASONE) 10 MG tablet Take 60mg  daily for 2 days then 40mg  daily for 2 days then 30mg  daily for 2 days then 20mg  daily for 2 days then 10mg  daily Patient taking differently: Take 10 mg by mouth daily with breakfast.  11/13/15  Yes [name], MD  simvastatin (ZOCOR) 40 MG tablet TAKE 1 TABLET (40 MG TOTAL) BY MOUTH EVERY EVENING. 08/29/15  Yes [name], MD  azithromycin (ZITHROMAX) 250 MG tablet 1 tab po daily for 4 days Patient not taking: Reported on 01/08/2016 11/14/15   [name], MD   BP 91/63 mmHg  Pulse 125  Temp(Src) 98.7 F (37.1 C) (Oral)  Resp 26  Ht 5\' 8"  (1.727 m)  Wt 145 lb (65.772 kg)  BMI 22.05 kg/m2  SpO2 100%   Patient Vitals for the past 24 hrs:  BP Temp Temp src Pulse Resp SpO2 Height  Weight  01/08/16 1901 113/90 mmHg - - (!) 52 18 100 % - -  01/08/16 1900 106/73 mmHg - - 89 26 96 % - -  01/08/16 1830 94/68 mmHg - - (!) 136 13 99 % - -  01/08/16 1800 91/63 mmHg - - (!) 125 26 100 % - -  01/08/16 1757 - - - - - 96 % - -  01/08/16 1730 100/83 mmHg - - 102 (!) 29 100 % - -  01/08/16 1715 - - - - - 99 % - -  01/08/16 1712 110/88 mmHg 98.7 F (37.1 C) Oral (!) 158 - 100 % - -  01/08/16 1704 - - - - - - 5\' 8"  (1.727 m)  145 lb (65.772 kg)     Physical Exam  1715: Physical examination:  Nursing notes reviewed; Vital signs and O2 SAT reviewed;  Constitutional: Well developed, Well nourished, uncomfortable appearing.; Head:  Normocephalic, atraumatic; Eyes: EOMI, PERRL, No scleral icterus; ENMT: Mouth and pharynx normal, Mucous membranes dry; Neck: Supple, Full range of motion, No lymphadenopathy; Cardiovascular: Irregular irregular rate and tachycardic rhythm, No gallop; Respiratory: Breath sounds diminished & equal bilaterally, insp/exp wheezes bilat, +audible wheezing.  Speaking long phrases. Tachypneic, sitting upright.; Chest: Nontender, Movement normal; Abdomen: Soft, Nontender, Nondistended, Normal bowel sounds; Genitourinary: No CVA tenderness; Extremities: Pulses normal, No tenderness, No edema, No calf edema or asymmetry.; Neuro: AA&Ox3, Major CN grossly intact.  Speech clear. No gross focal motor or sensory deficits in extremities.; Skin: Color normal, Warm, Dry.    ED Course  Procedures (including critical care time) Labs Review  Imaging Review  I have personally reviewed and evaluated these images and lab results as part of my medical decision-making.   EKG Interpretation   Date/Time:  Tuesday January 08 2016 17:05:34 EST Ventricular Rate:  157 PR Interval:  103 QRS Duration: 109 QT Interval:  281 QTC Calculation: [id number] Axis:   -46 Text Interpretation:  Poor data quality, interpretation may be  adversely affected Atrial fibrillation with rapid ventricular  response  Left axis deviation Abnormal R-wave progression, early transition  Nonspecific T abnormalities, lateral leads Artifact When compared with ECG  of 10/02/2015 No significant change was found Confirmed by [facility]  MD,  Nunzio Cory [id number]) on 01/08/2016 5:53:59 PM      MDM  MDM Reviewed: previous chart, nursing note and vitals Reviewed previous: labs and ECG Interpretation: labs, ECG and x-ray Total time providing critical care: 30-74 minutes. This excludes time spent performing separately reportable procedures and services. Consults: admitting MD     CRITICAL CARE Performed by: Alfonzo Feller Total critical care time: 35 minutes Critical care time was exclusive of separately billable procedures and treating other patients. Critical care was necessary to treat or prevent imminent or life-threatening deterioration. Critical care was time spent personally by me on the following activities: development of treatment plan with patient and/or surrogate as well as nursing, discussions with consultants, evaluation of patient's response to treatment, examination of patient, obtaining history from patient or surrogate, ordering and performing treatments and interventions, ordering and review of laboratory studies, ordering and review of radiographic studies, pulse oximetry and re-evaluation of patient's condition.  Results for orders placed or performed during the hospital encounter of [id number]  Basic metabolic panel  Result Value Ref Range   Sodium 144 135 - 145 mmol/L   Potassium 4.6 3.5 - 5.1 mmol/L   Chloride 106 101 - 111 mmol/L   CO2 32 22 - 32 mmol/L   Glucose, Bld 132 (H) 65 - 99 mg/dL   BUN 9 6 - 20 mg/dL   Creatinine, Ser 0.72 0.61 - 1.24 mg/dL   Calcium 9.6 8.9 - 10.3 mg/dL   GFR calc non Af Amer >60 >60 mL/min   GFR calc Af Amer >60 >60 mL/min   Anion gap 6 5 - 15  Brain natriuretic peptide  Result Value Ref Range   B Natriuretic Peptide 149.0 (H) 0.0 - 100.0 pg/mL   Troponin I  Result Value Ref Range   Troponin I <0.03 <0.031 ng/mL  CBC with Differential  Result Value Ref Range   WBC 10.3 4.0 - 10.5 K/uL   RBC 4.84 4.22 - 5.81 MIL/uL   Hemoglobin 14.0  13.0 - 17.0 g/dL   HCT 45.2 39.0 - 52.0 %   MCV 93.4 78.0 - 100.0 fL   MCH 28.9 26.0 - 34.0 pg   MCHC 31.0 30.0 - 36.0 g/dL   RDW 14.3 11.5 - 15.5 %   Platelets 242 150 - 400 K/uL   Neutrophils Relative % 76 %   Neutro Abs 7.8 (H) 1.7 - 7.7 K/uL   Lymphocytes Relative 14 %   Lymphs Abs 1.4 0.7 - 4.0 K/uL   Monocytes Relative 10 %   Monocytes Absolute 1.0 0.1 - 1.0 K/uL   Eosinophils Relative 0 %   Eosinophils Absolute 0.0 0.0 - 0.7 K/uL   Basophils Relative 0 %   Basophils Absolute 0.0 0.0 - 0.1 K/uL   Dg Chest Port 1 View 01/08/2016  CLINICAL DATA:  Increasing shortness of breath EXAM: PORTABLE CHEST 1 VIEW COMPARISON:  11/20/2015 FINDINGS: Normal heart size. There is no pleural effusion or edema. Advanced chronic interstitial coarsening and hyperinflation is noted bilaterally. Biapical pleural and parenchymal scarring is noted and appears similar to previous exam. No superimposed pulmonary edema or airspace consolidation. IMPRESSION: 1. Advanced changes of COPD. 2. No acute findings identified. Electronically Signed   By: Kerby Moors M.D.   On: 01/08/2016 17:33    1830:  On arrival: pt sitting upright, tachypneic, tachycardic, Sats 99 % O2 3L N/C, lungs diminished with wheezes. Monitor afib/RVR, rates 150-170's. IV cardizem bolus and gtt started. IV solumedrol and hour long neb also started. After neb: pt appears more comfortable at rest, less tachypneic, Sats 96 % on O2 3L N/C, lungs continue diminished. Pt's HR decreased to 100's, monitor continues afib. SBP dropped after IV cardizem; judicious IVF given with improvement. Dx and testing d/w pt.  Questions answered.  Verb understanding, agreeable to admit.   1910:  HR slowly increased again to 140-150's, monitor afib/RVR. Will re-bolus IV  cardizem and increase gtt. T/C to [facility]. [place] Comment, case discussed, including:  HPI, pertinent PM/SHx, VS/PE, dx testing, ED course and treatment:  Agreeable to admit, requests to write temporary orders, obtain stepdown bed to team APAdmits.    Francine Graven, DO 01/11/16 0001

## 2016-01-09 DIAGNOSIS — I4891 Unspecified atrial fibrillation: Secondary | ICD-10-CM | POA: Diagnosis not present

## 2016-01-09 LAB — TROPONIN I
TROPONIN I: 0.04 ng/mL — AB (ref <0.031)
TROPONIN I: 0.03 ng/mL (ref <0.031)

## 2016-01-09 MED ORDER — AZITHROMYCIN 250 MG PO TABS
500.0000 mg | ORAL_TABLET | Freq: Every day | ORAL | Status: AC
  Administered 2016-01-09: 500 mg via ORAL
  Filled 2016-01-09: qty 2

## 2016-01-09 MED ORDER — PREDNISONE 20 MG PO TABS
60.0000 mg | ORAL_TABLET | Freq: Every day | ORAL | Status: DC
Start: 2016-01-09 — End: 2016-01-10
  Administered 2016-01-09 – 2016-01-10 (×2): 60 mg via ORAL
  Filled 2016-01-09 (×3): qty 3

## 2016-01-09 MED ORDER — AZITHROMYCIN 250 MG PO TABS
250.0000 mg | ORAL_TABLET | Freq: Every day | ORAL | Status: DC
  Administered 2016-01-10: 250 mg via ORAL
  Filled 2016-01-09: qty 1

## 2016-01-09 NOTE — Progress Notes (Signed)
Kenneth Merritt [id number] DOB: 22-Jun-1951 DOA: 01/08/2016 PCP: Neale Burly, MD  Brief narrative: 65 y/o ? Known history mod-severe COPD with Chronic respiratory failure, baseline 2 L nasal cannula Renal artery stenosis CAD Nstemi Lad 8.25.2010 Paroxysmal A. fib, [place] score 2-3 on Elliquis for anticoagulation Hyperlipidemia History prostate cancer Peripheral vascular disease s/p Bypass aorto-bifem bypass 01/2010-PAD with 70% ostial of aorta at level of renal art stenosis.  Admitted 01/08/16 with CP and found to be in atrial fibrillation with RVR Note recent multiple admissions 11/13/15 for COPD exacerbation as well as 10/04/15. Supposed to be on Xopenex inhalerb but has been told that this probably would not be paid for by his insurance and therefore taking only prednisone and albuterol inhalers  Tells me he is been on a steroid taper prednisone 10 mg and feels some sinus pressure and discomfort in the front of his face. Also states he has not had any sputum any chills any rigors When he flips into atrial fibrillation he has discomfort in his chest like GERD, when he had his and STEMI in 2010 it felt like butterflies + severe pain in epigastrium       Past medical history-As per Problem list Chart reviewed as below- Reviewed  Consultants:  None  Procedures:  None  Antibiotics:  None   Subjective  Doing fair, tolerating diet No chest pain No nausea no vomiting No blurred vision no double vision no unilateral weakness Does still have a cough without much sputum Maxillary sinuses are tender    Objective    Interim History:   Telemetry: Converted to sinus rhythm early 01/09/16   Objective: Filed Vitals:   01/09/16 0200 01/09/16 0300 01/09/16 0400 01/09/16 0500  BP: 102/65 87/62 100/63 85/55  Pulse:      Temp:   97.6 F (36.4 C)   TempSrc:   Oral   Resp: 19 18 16 19   Height:      Weight:    66 kg (145 lb 8.1 oz)  SpO2:        Intake/Output Summary  (Last 24 hours) at 01/09/16 0719 Last data filed at 01/09/16 0300  Gross per 24 hour  Intake      0 ml  Output    350 ml  Net   -350 ml    Exam:  General: Alert oriented in no apparent distress Cardiovascular: S1-S2 regular rate rhythm cannot appreciate murmur, no JVD  Respiratory: Crackly lung sounds bilaterally, no rales no rhonchi Abdomen: Soft, scar in center of her abdomen Skin no lower extremity edema Neuro intact. No neurological deficit  Data Reviewed: Basic Metabolic Panel:  Recent Labs Lab 01/08/16 1707  NA 144  K 4.6  CL 106  CO2 32  GLUCOSE 132*  BUN 9  CREATININE 0.72  CALCIUM 9.6   Liver Function Tests: No results for input(s): AST, ALT, ALKPHOS, BILITOT, PROT, ALBUMIN in the last 168 hours. No results for input(s): LIPASE, AMYLASE in the last 168 hours. No results for input(s): AMMONIA in the last 168 hours. CBC:  Recent Labs Lab 01/08/16 1707  WBC 10.3  NEUTROABS 7.8*  HGB 14.0  HCT 45.2  MCV 93.4  PLT 242   Cardiac Enzymes:  Recent Labs Lab 01/08/16 1707 01/08/16 2058 01/09/16 0223  TROPONINI <0.03 <0.03 0.04*   BNP: Invalid input(s): POCBNP CBG: No results for input(s): GLUCAP in the last 168 hours.  Recent Results (from the past 240 hour(s))  MRSA PCR Screening  Status: None   Collection Time: 01/08/16  8:25 PM  Result Value Ref Range Status   MRSA by PCR NEGATIVE NEGATIVE Final    Comment:        The GeneXpert MRSA Assay (FDA approved for NASAL specimens only), is one component of a comprehensive MRSA colonization surveillance program. It is not intended to diagnose MRSA infection nor to guide or monitor treatment for MRSA infections.      Studies:              All Imaging reviewed and is as per above notation   Scheduled Meds: . sodium chloride   Intravenous STAT  . ALPRAZolam  0.25 mg Oral QHS  . apixaban  5 mg Oral BID  . atorvastatin  20 mg Oral q1800  . gabapentin  600 mg Oral BID  . hydrocortisone  sod succinate (SOLU-CORTEF) inj  50 mg Intravenous Q8H  . levalbuterol  0.63 mg Nebulization Q6H  . methylPREDNISolone (SOLU-MEDROL) injection  40 mg Intravenous Q6H  . metoprolol  50 mg Oral BID  . pantoprazole  40 mg Oral Daily  . sodium chloride flush  3 mL Intravenous Q12H   Continuous Infusions: . sodium chloride 100 mL/hr at 01/08/16 2314  . diltiazem (CARDIZEM) infusion 5 mg/hr (01/09/16 0015)     Assessment/Plan:  1. Atrial fibrillation with RVR, [place] score =2-home medications include Lopressor 50 twice a day.  Precipitant likely albuterol. Check magnesium in a.m., treat underlying causes. Continue Elliquis for secondary prevention 5 mg twice a day.  Discontinue stress dose steroids 2. Possible rhinosinusitis-usually is viral but as he's been having this for a long period of time, will treat with z-Pac 3. Moderate to severe COPD-transitioned from Solu-Medrol 40 every 6- steroid taper prednisone 60 mg over a long period of time. [name] Has been using oxygen since last 3 months continuously. Continue albuterol every 4 when necessary wheeze, Atrovent 0.5 every 4 when necessary-consider addition Brovana or long-acting anticholinergic.   Will need pulmonary Pulm input soon. Hyperlipidemia-Zocor substituted with Lipitor 20 mg every afternoon  4. GERD- continue Protonix 40 daily.      transition out of step down unit transfer out of step down unit today   keep on telemetry    Verneita Griffes, MD  [facility] Pager [phone] 01/09/2016, 7:19 AM

## 2016-01-09 NOTE — Care Management Note (Signed)
Case Management Note  Patient Details  Name: Kenneth Merritt MRN: [id number] Date of Birth: Sep 06, 1951  Subjective/Objective:                  Pt is from home, lives with wife and is ind with ADL's. Pt has home O2 and neb machine prior to admission. Pt plans to return home with self care at DC. Pt asking to speak with SW about help with electricity bill, informed patient that the CSW at the hospital could not assist with that but he should contact his DSS social worker to receive guidance.    Action/Plan: No CM needs anticipated. Will cont to follow.   Expected Discharge Date:                  Expected Discharge Plan:  Home/Self Care  In-House Referral:  NA  Discharge planning Services  CM Consult  Post Acute Care Choice:  NA Choice offered to:  NA  DME Arranged:    DME Agency:     HH Arranged:    HH Agency:     Status of Service:  In process, will continue to follow  Medicare Important Message Given:    Date Medicare IM Given:    Medicare IM give by:    Date Additional Medicare IM Given:    Additional Medicare Important Message give by:     If discussed at [place] of Stay Meetings, dates discussed:    Additional Comments:  Sherald Barge, RN 01/09/2016, 3:36 PM

## 2016-01-09 NOTE — Care Management Obs Status (Signed)
[place] NOTIFICATION   Patient Details  Name: AQIL WAHLE MRN: [id number] Date of Birth: July 10, 1951   Medicare Observation Status Notification Given:  Yes    Sherald Barge, RN 01/09/2016, 3:35 PM

## 2016-01-10 DIAGNOSIS — I4891 Unspecified atrial fibrillation: Secondary | ICD-10-CM | POA: Diagnosis not present

## 2016-01-10 LAB — COMPREHENSIVE METABOLIC PANEL
ALBUMIN: 3.1 g/dL — AB (ref 3.5–5.0)
ALT: 21 U/L (ref 17–63)
AST: 19 U/L (ref 15–41)
Alkaline Phosphatase: 48 U/L (ref 38–126)
Anion gap: 6 (ref 5–15)
BILIRUBIN TOTAL: 0.5 mg/dL (ref 0.3–1.2)
BUN: 14 mg/dL (ref 6–20)
CHLORIDE: 107 mmol/L (ref 101–111)
CO2: 31 mmol/L (ref 22–32)
CREATININE: 0.51 mg/dL — AB (ref 0.61–1.24)
Calcium: 8.5 mg/dL — ABNORMAL LOW (ref 8.9–10.3)
GFR calc Af Amer: >60 mL/min (ref >60)
GFR calc non Af Amer: >60 mL/min (ref >60)
GLUCOSE: 134 mg/dL — AB (ref 65–99)
POTASSIUM: 4.3 mmol/L (ref 3.5–5.1)
SODIUM: 144 mmol/L (ref 135–145)
Total Protein: 5.7 g/dL — ABNORMAL LOW (ref 6.5–8.1)

## 2016-01-10 LAB — CBC
HCT: 36 % — ABNORMAL LOW (ref 39.0–52.0)
HEMOGLOBIN: 11.3 g/dL — AB (ref 13.0–17.0)
MCH: 29.2 pg (ref 26.0–34.0)
MCHC: 31.4 g/dL (ref 30.0–36.0)
MCV: 93 fL (ref 78.0–100.0)
Platelets: 207 10*3/uL (ref 150–400)
RBC: 3.87 MIL/uL — AB (ref 4.22–5.81)
RDW: 14.5 % (ref 11.5–15.5)
WBC: 17.1 10*3/uL — AB (ref 4.0–10.5)

## 2016-01-10 LAB — MAGNESIUM: Magnesium: 2.4 mg/dL (ref 1.7–2.4)

## 2016-01-10 MED ORDER — DIGOXIN 125 MCG PO TABS: 0.1250 mg | ORAL_TABLET | Freq: Every day | ORAL | Status: DC

## 2016-01-10 MED ORDER — AZITHROMYCIN 250 MG PO TABS: ORAL_TABLET | ORAL | Status: DC

## 2016-01-10 MED ORDER — SIMVASTATIN 20 MG PO TABS: 40.0000 mg | ORAL_TABLET | Freq: Every day | ORAL | Status: DC

## 2016-01-10 MED ORDER — DILTIAZEM HCL 60 MG PO TABS: 60.0000 mg | ORAL_TABLET | Freq: Two times a day (BID) | ORAL | Status: DC

## 2016-01-10 MED ORDER — PREDNISONE 20 MG PO TABS: 20.0000 mg | ORAL_TABLET | Freq: Every day | ORAL | Status: DC

## 2016-01-10 MED ORDER — DIGOXIN 125 MCG PO TABS
0.1250 mg | ORAL_TABLET | Freq: Every day | ORAL | Status: DC
  Administered 2016-01-10: 0.125 mg via ORAL
  Filled 2016-01-10: qty 1

## 2016-01-10 NOTE — Discharge Summary (Signed)
Physician Discharge Summary  Kenneth Merritt [id number] DOB: Oct 02, 1951 DOA: 01/08/2016  PCP: Neale Burly, MD  Admit date: 01/08/2016 Discharge date: 01/10/2016  Time spent: 35 minutes  Recommendations for Outpatient Follow-up:  1. digoxin started this admission-needs Chem-7 in about one week to determine renal function still okay 2. Recommended taper of prednisone and sent into pharmacy 3. Patient should continue usual medications otherwise  Discharge Diagnoses:  Principal Problem:   Atrial fibrillation with rapid ventricular response (HCC) Active Problems:   GERD (gastroesophageal reflux disease)   COPD (chronic obstructive pulmonary disease) (HCC)   Dyslipidemia   S/P aortobifemoral bypass surgery   CAD (coronary artery disease), native coronary artery   COPD with acute exacerbation (HCC)   Paroxysmal a-fib ([place])   Discharge Condition: improved  Diet recommendation: heart healthy low-salt   Filed Weights   01/08/16 2045 01/09/16 0500 01/10/16 0500  Weight: 66 kg (145 lb 8.1 oz) 66 kg (145 lb 8.1 oz) 66 kg (145 lb 8.1 oz)    History of present illness:  65 y/o ? Known history mod-severe COPD with Chronic respiratory failure, baseline 2 L nasal cannula Renal artery stenosis CAD Nstemi Lad 8.25.2010 Paroxysmal A. fib, [place] score 2-3 on Elliquis for anticoagulation Hyperlipidemia History prostate cancer Peripheral vascular disease s/p Bypass aorto-bifem bypass 01/2010-PAD with 70% ostial of aorta at level of renal art stenosis.  Admitted 01/08/16 with CP and found to be in atrial fibrillation with RVR Note recent multiple admissions 11/13/15 for COPD exacerbation as well as 10/04/15. Supposed to be on Xopenex inhalerb but has been told that this probably would not be paid for by his insurance and therefore taking only prednisone and albuterol inhalers  Tells me he is been on a steroid taper prednisone 10 mg and feels some sinus pressure and discomfort in the front of his  face. Also states he has not had any sputum any chills any rigors When he flips into atrial fibrillation he has discomfort in his chest like GERD, when he had his and STEMI in 2010 it felt like butterflies + severe pain in epigastrium     Hospital Course:   1. Atrial fibrillation with RVR, [place] score =2-home medications include Lopressor 50 twice a day. Precipitant likely albuterol. Check magnesium in a.m., treat underlying causes. Continue Elliquis for secondary prevention 5 mg twice a day-Patient's heart rate is reasonably controlled on metoprolol and converted to sinus rhythm. I had a discussion with Dr. Domenic Polite of cardiology who indicated either diltiazem or digoxin would be reasonable and as patient has had some dips in his blood pressure to the 40s overnight we elected to use digoxin 2. Possible rhinosinusitis-usually is viral but as he's been having this for a long period of time, will treat with z-Pac--to complete 4 more doses as an outpatient 3. Moderate to severe COPD-transitioned from Solu-Medrol 40 every 6- steroid taper  Has been using oxygen since last 3 months continuously. Continue albuterol every 4 when necessary wheeze, Atrovent 0.5 every 4 when necessary-consider addition Brovana or long-acting anticholinergic. Will need pulmonary Pulm input soon.. Discontinue stress dose steroids--patient was transitioned on discharge to a prednisone taper after being placed on prednisone 60 and he will complete this taper within 5 days 4.  Hyperlipidemia-Zocor substituted with Lipitor 20 mg every afternoon  5. GERD- continue Protonix 40 daily.   Discharge Exam: Filed Vitals:   01/10/16 1053 01/10/16 1222  BP:    Pulse: 67   Temp:  96.6 F (35.9 C)  Resp:     some sinus pressure Ambulatory Seems to be close to baseline Heart rate is now controlled  General: eomi ncat Cardiovascular: s1 s 2no m/r/g Respiratory: slight wheeze but otherwise clear  Discharge  Instructions   Discharge Instructions    Diet - low sodium heart healthy    Complete by:  As directed      Discharge instructions    Complete by:  As directed   continue albuterol as scheduled  we have started you on a new medication that will help control the heart rate called digoxin. This medication is a good medication that you need lab work done in about a week to 2 weeks at a regular physician's office to check your kidney function   I will also prescribe for you a taper of steroids that you can pick up from the pharmacy as an outpatient     Increase activity slowly    Complete by:  As directed           Current Discharge Medication List    START taking these medications   Details  digoxin (LANOXIN) 0.125 MG tablet Take 1 tablet (0.125 mg total) by mouth daily. Qty: 30 tablet, Refills: 0      CONTINUE these medications which have CHANGED   Details  predniSONE (DELTASONE) 20 MG tablet Take 1 tablet (20 mg total) by mouth daily with breakfast. Qty: 20 tablet, Refills: 0      CONTINUE these medications which have NOT CHANGED   Details  albuterol (PROVENTIL HFA;VENTOLIN HFA) 108 (90 BASE) MCG/ACT inhaler Inhale 2 puffs into the lungs every 4 (four) hours as needed for wheezing or shortness of breath. Qty: 1 Inhaler, Refills: 0    albuterol (PROVENTIL) (2.5 MG/3ML) 0.083% nebulizer solution Take 3 mLs (2.5 mg total) by nebulization every 4 (four) hours as needed for wheezing or shortness of breath. Qty: 75 mL, Refills: 12    ALPRAZolam (XANAX) 0.25 MG tablet Take 0.25 mg by mouth at bedtime. Refills: 0    apixaban (ELIQUIS) 5 MG TABS tablet Take 1 tablet (5 mg total) by mouth 2 (two) times daily. Qty: 60 tablet, Refills: 6    gabapentin (NEURONTIN) 300 MG capsule Take two capsules by mouth twice daily    HYDROcodone-acetaminophen (NORCO/VICODIN) 5-325 MG per tablet Take 1 tablet by mouth every 6 (six) hours as needed for moderate pain.    ipratropium (ATROVENT) 0.02 %  nebulizer solution Take 0.5 mg by nebulization every 4 (four) hours as needed for wheezing or shortness of breath.    metoprolol (LOPRESSOR) 50 MG tablet Take 50 mg by mouth 2 (two) times daily.    nitroGLYCERIN (NITROSTAT) 0.4 MG SL tablet Place 1 tablet (0.4 mg total) under the tongue every 5 (five) minutes x 3 doses as needed for chest pain. Qty: 25 tablet, Refills: 3    omeprazole (PRILOSEC) 20 MG capsule Take 1 capsule by mouth every morning.     simvastatin (ZOCOR) 40 MG tablet TAKE 1 TABLET (40 MG TOTAL) BY MOUTH EVERY EVENING. Qty: 30 tablet, Refills: 9      STOP taking these medications     azithromycin (ZITHROMAX) 250 MG tablet        Allergies  Allergen Reactions  . Levaquin [Levofloxacin In D5w]     Pt states it caused dry mouth and rectal bleeding.       The results of significant diagnostics from this hospitalization (including imaging, microbiology, ancillary and laboratory) are listed below for  reference.    Significant Diagnostic Studies: Dg Chest Port 1 View  01/08/2016  CLINICAL DATA:  Increasing shortness of breath EXAM: PORTABLE CHEST 1 VIEW COMPARISON:  11/20/2015 FINDINGS: Normal heart size. There is no pleural effusion or edema. Advanced chronic interstitial coarsening and hyperinflation is noted bilaterally. Biapical pleural and parenchymal scarring is noted and appears similar to previous exam. No superimposed pulmonary edema or airspace consolidation. IMPRESSION: 1. Advanced changes of COPD. 2. No acute findings identified. Electronically Signed   By: Kerby Moors M.D.   On: 01/08/2016 17:33    Microbiology: Recent Results (from the past 240 hour(s))  MRSA PCR Screening     Status: None   Collection Time: 01/08/16  8:25 PM  Result Value Ref Range Status   MRSA by PCR NEGATIVE NEGATIVE Final    Comment:        The GeneXpert MRSA Assay (FDA approved for NASAL specimens only), is one component of a comprehensive MRSA colonization surveillance  program. It is not intended to diagnose MRSA infection nor to guide or monitor treatment for MRSA infections.      Labs: Basic Metabolic Panel:  Recent Labs Lab 01/08/16 1707 01/10/16 0501  NA 144 144  K 4.6 4.3  CL 106 107  CO2 32 31  GLUCOSE 132* 134*  BUN 9 14  CREATININE 0.72 0.51*  CALCIUM 9.6 8.5*  MG  --  2.4   Liver Function Tests:  Recent Labs Lab 01/10/16 0501  AST 19  ALT 21  ALKPHOS 48  BILITOT 0.5  PROT 5.7*  ALBUMIN 3.1*   No results for input(s): LIPASE, AMYLASE in the last 168 hours. No results for input(s): AMMONIA in the last 168 hours. CBC:  Recent Labs Lab 01/08/16 1707 01/10/16 0501  WBC 10.3 17.1*  NEUTROABS 7.8*  --   HGB 14.0 11.3*  HCT 45.2 36.0*  MCV 93.4 93.0  PLT 242 207   Cardiac Enzymes:  Recent Labs Lab 01/08/16 1707 01/08/16 2058 01/09/16 0223 01/09/16 0833  TROPONINI <0.03 <0.03 0.04* 0.03   BNP: BNP (last 3 results)  Recent Labs  08/02/15 1932 10/02/15 2130 01/08/16 1707  BNP 42.0 80.0 149.0*    ProBNP (last 3 results) No results for input(s): PROBNP in the last 8760 hours.  CBG: No results for input(s): GLUCAP in the last 168 hours.     SignedNita Sells MD   [facility] 01/10/2016, 12:29 PM

## 2016-01-10 NOTE — Care Management Note (Signed)
Case Management Note  Patient Details  Name: Kenneth Merritt MRN: [id number] Date of Birth: October 08, 1951  Expected Discharge Date:    01/10/2016              Expected Discharge Plan:  Home/Self Care  In-House Referral:  NA  Discharge planning Services  CM Consult  Post Acute Care Choice:  NA Choice offered to:  NA  DME Arranged:    DME Agency:     HH Arranged:    [place] Agency:     Status of Service:  Completed, signed off  Medicare Important Message Given:    Date Medicare IM Given:    Medicare IM give by:    Date Additional Medicare IM Given:    Additional Medicare Important Message give by:     If discussed at [place] of Stay Meetings, dates discussed:    Additional Comments: Pt discharging home today with self care. No CM needs.   Sherald Barge, RN 01/10/2016, 2:14 PM

## 2016-01-10 NOTE — Progress Notes (Signed)
Patient being discharged to home. Wife is coming to transport by car and we will take patient to car by wheel chair. IV aaccess removed with considerable bleeding, held pressure for 5 minutes and then 2x2  Dressing taped tight over IV removal site.

## 2016-02-10 ENCOUNTER — Other Ambulatory Visit: Payer: Self-pay | Admitting: Cardiology

## 2016-03-19 ENCOUNTER — Other Ambulatory Visit: Payer: Self-pay

## 2016-03-19 ENCOUNTER — Encounter (HOSPITAL_COMMUNITY): Payer: Self-pay

## 2016-03-19 ENCOUNTER — Emergency Department (HOSPITAL_COMMUNITY)
Admission: EM | Admit: 2016-03-19 | Discharge: 2016-03-20 | Disposition: A | Discharge status: Home / Self Care | Payer: Medicare Other | Attending: Emergency Medicine | Admitting: Emergency Medicine

## 2016-03-19 ENCOUNTER — Emergency Department (HOSPITAL_COMMUNITY): Payer: Medicare Other

## 2016-03-19 DIAGNOSIS — Z79899 Other long term (current) drug therapy: Secondary | ICD-10-CM | POA: Insufficient documentation

## 2016-03-19 DIAGNOSIS — I2510 Atherosclerotic heart disease of native coronary artery without angina pectoris: Secondary | ICD-10-CM | POA: Diagnosis not present

## 2016-03-19 DIAGNOSIS — I252 Old myocardial infarction: Secondary | ICD-10-CM | POA: Insufficient documentation

## 2016-03-19 DIAGNOSIS — J441 Chronic obstructive pulmonary disease with (acute) exacerbation: Secondary | ICD-10-CM | POA: Insufficient documentation

## 2016-03-19 DIAGNOSIS — I4891 Unspecified atrial fibrillation: Secondary | ICD-10-CM | POA: Insufficient documentation

## 2016-03-19 DIAGNOSIS — E785 Hyperlipidemia, unspecified: Secondary | ICD-10-CM | POA: Insufficient documentation

## 2016-03-19 DIAGNOSIS — Z87891 Personal history of nicotine dependence: Secondary | ICD-10-CM | POA: Diagnosis not present

## 2016-03-19 DIAGNOSIS — I739 Peripheral vascular disease, unspecified: Secondary | ICD-10-CM | POA: Insufficient documentation

## 2016-03-19 DIAGNOSIS — R0602 Shortness of breath: Secondary | ICD-10-CM | POA: Diagnosis present

## 2016-03-19 LAB — COMPREHENSIVE METABOLIC PANEL
ALT: 23 U/L (ref 17–63)
AST: 21 U/L (ref 15–41)
Albumin: 3.7 g/dL (ref 3.5–5.0)
Alkaline Phosphatase: 51 U/L (ref 38–126)
Anion gap: 9 (ref 5–15)
BILIRUBIN TOTAL: 0.6 mg/dL (ref 0.3–1.2)
BUN: 10 mg/dL (ref 6–20)
CHLORIDE: 103 mmol/L (ref 101–111)
CO2: 28 mmol/L (ref 22–32)
CREATININE: 0.57 mg/dL — AB (ref 0.61–1.24)
Calcium: 9.1 mg/dL (ref 8.9–10.3)
Glucose, Bld: 120 mg/dL — ABNORMAL HIGH (ref 65–99)
POTASSIUM: 4.1 mmol/L (ref 3.5–5.1)
Sodium: 140 mmol/L (ref 135–145)
TOTAL PROTEIN: 7 g/dL (ref 6.5–8.1)

## 2016-03-19 LAB — CBC WITH DIFFERENTIAL/PLATELET
Basophils Absolute: 0 10*3/uL (ref 0.0–0.1)
Basophils Relative: 0 %
EOS PCT: 0 %
Eosinophils Absolute: 0 10*3/uL (ref 0.0–0.7)
HEMATOCRIT: 42.4 % (ref 39.0–52.0)
Hemoglobin: 13.1 g/dL (ref 13.0–17.0)
LYMPHS ABS: 1.5 10*3/uL (ref 0.7–4.0)
LYMPHS PCT: 13 %
MCH: 28 pg (ref 26.0–34.0)
MCHC: 30.9 g/dL (ref 30.0–36.0)
MCV: 90.6 fL (ref 78.0–100.0)
MONO ABS: 1 10*3/uL (ref 0.1–1.0)
Monocytes Relative: 9 %
Neutro Abs: 8.7 10*3/uL — ABNORMAL HIGH (ref 1.7–7.7)
Neutrophils Relative %: 78 %
PLATELETS: 227 10*3/uL (ref 150–400)
RBC: 4.68 MIL/uL (ref 4.22–5.81)
RDW: 14.1 % (ref 11.5–15.5)
WBC: 11.2 10*3/uL — ABNORMAL HIGH (ref 4.0–10.5)

## 2016-03-19 LAB — TROPONIN I

## 2016-03-19 MED ORDER — IPRATROPIUM BROMIDE 0.02 % IN SOLN
0.5000 mg | Freq: Once | RESPIRATORY_TRACT | Status: AC
  Administered 2016-03-19: 0.5 mg via RESPIRATORY_TRACT
  Filled 2016-03-19: qty 2.5

## 2016-03-19 MED ORDER — IPRATROPIUM-ALBUTEROL 0.5-2.5 (3) MG/3ML IN SOLN: 3.0000 mL | Freq: Once | RESPIRATORY_TRACT | Status: DC

## 2016-03-19 MED ORDER — LEVALBUTEROL HCL 1.25 MG/0.5ML IN NEBU
1.2500 mg | INHALATION_SOLUTION | Freq: Once | RESPIRATORY_TRACT | Status: AC
  Administered 2016-03-19: 1.25 mg via RESPIRATORY_TRACT
  Filled 2016-03-19: qty 0.5

## 2016-03-19 NOTE — ED Provider Notes (Signed)
By signing my name below, I, Kenneth Merritt, attest that this documentation has been prepared under the direction and in the presence of [employer], DO. Electronically Signed: Randa Merritt, ED Scribe. 03/19/2016. 11:14 PM.  TIME SEEN: 11:14 PM  CHIEF COMPLAINT: SOB  HPI:  Kenneth Merritt is a 65 y.o. male brought in by ambulance, with history of COPD, atrial fibrillation, CAD who presents to the Emergency Department complaining of SOB onset PTA. Pt states that his symptoms feel like previous COPD exacerbation. Pt doesn't report any associated symptoms. Pt states that his breathing is worse when standing.Pt states that he has tried at home nebulizer with no relief. Pt was given solumedrol and albuterol in route that has provided some relief. Pt denies cough or new CP. Reports he is on oxygen chronically at home. No lower extremity swelling or pain.     ROS: See HPI Constitutional: no fever  Eyes: no drainage  ENT: no runny nose   Cardiovascular:  No new chest pain  Resp:  SOB  GI: no vomiting GU: no dysuria Integumentary: no rash  Allergy: no hives  Musculoskeletal: no leg swelling  Neurological: no slurred speech ROS otherwise negative  PAST MEDICAL HISTORY/PAST SURGICAL HISTORY:  Past Medical History  Diagnosis Date  . GERD (gastroesophageal reflux disease)   . COPD (chronic obstructive pulmonary disease) ([place])     severe by PFTs August 2010  . 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 Atrial fibrillation ([facility])     post-op a-fib. 3/11. post Aortobifem , /  Atrial fibrillation from nebulizer treatment  May, 2012, while hospitalized  . Renal artery stenosis ([place])      presumably corrected at time of aortobifem surgery March, 2011  . Tobacco user      stop smoking  . Pneumothorax      2011  before aortobifemoral surgery,  resolved  . Peripheral vascular disease, unspecified ([place])    bilateral intermittent claudication  . CAD (coronary artery disease), native coronary artery     catheterization 06/2009.Kenneth KitchenMarland Kitchen70% proximal LAD and 30% proximal RCA. normal LV function, no intervention planned prior to surgery for aorta. LV normal function  by catheter 8/10.   Kenneth Merritt Dyslipidemia   . DJD (degenerative joint disease)   . Ejection fraction     60%, echo, May, 2012, rapid atrial fib at the time  . Cancer [facility] Merritt) 2013    Prostate Radiation  . Myocardial infarction ([place]) 2011  . On home O2     2L N/C    MEDICATIONS:  Prior to Admission medications   Medication Sig Start Date End Date Taking? Authorizing Provider  albuterol (PROVENTIL HFA;VENTOLIN HFA) 108 (90 BASE) MCG/ACT inhaler Inhale 2 puffs into the lungs every 4 (four) hours as needed for wheezing or shortness of breath. 09/19/15  Yes Donne [name], MD  albuterol (PROVENTIL) (2.5 MG/3ML) 0.083% nebulizer solution Take 3 mLs (2.5 mg total) by nebulization every 4 (four) hours as needed for wheezing or shortness of breath. 03/15/15  Yes [name], MD  ALPRAZolam [name]) 0.25 MG tablet Take 0.25 mg by mouth at bedtime. 12/17/15  Yes Historical Provider, MD  digoxin (LANOXIN) 0.125 MG tablet Take 1 tablet (0.125 mg total) by mouth daily. 01/10/16  Yes [name], MD  ELIQUIS 5 MG TABS tablet TAKE 1 TABLET BY MOUTH TWICE A DAY 02/11/16  Yes [name]  Delman Kitten, MD  gabapentin (NEURONTIN) 300 MG capsule Take two capsules by mouth twice daily 10/30/15  Yes Historical Provider, MD  HYDROcodone-acetaminophen (NORCO/VICODIN) 5-325 MG tablet Take 1 tablet by mouth 2 (two) times daily. 05/24/16 06/23/16 Yes Historical Provider, MD  ipratropium (ATROVENT) 0.02 % nebulizer solution Take 0.5 mg by nebulization every 4 (four) hours as needed for wheezing or shortness of breath.   Yes Historical Provider, MD  metoprolol (LOPRESSOR) 50 MG tablet Take 50 mg by mouth 2 (two) times daily. 12/27/13  Yes Historical Provider, MD  nitroGLYCERIN (NITROSTAT)  0.4 MG SL tablet Place 1 tablet (0.4 mg total) under the tongue every 5 (five) minutes x 3 doses as needed for chest pain. 06/11/15  Yes [name], MD  omeprazole (PRILOSEC) 20 MG capsule Take 1 capsule by mouth every morning.  04/21/11  Yes Historical Provider, MD  predniSONE (DELTASONE) 10 MG tablet Take 10 mg by mouth daily. 03/11/16  Yes Historical Provider, MD  simvastatin (ZOCOR) 40 MG tablet TAKE 1 TABLET (40 MG TOTAL) BY MOUTH EVERY EVENING. 08/29/15  Yes [name], MD    ALLERGIES:  Allergies  Allergen Reactions  . Levaquin [Levofloxacin In D5w]     Pt states it caused dry mouth and rectal bleeding.     SOCIAL HISTORY:  Social History  Substance Use Topics  . Smoking status: Former Smoker -- 2.00 packs/day for 40 years    Types: Cigarettes    Quit date: 01/11/2010  . Smokeless tobacco: Never Used  . Alcohol Use: No    FAMILY HISTORY: Family History  Problem Relation Age of Onset  . Cancer Father     prostate  . Heart attack Father     EXAM: BP 135/111 mmHg  Pulse 79  Temp(Src) 97.5 F (36.4 C) (Oral)  Resp 17  Ht 5\' 4"  (1.626 m)  Wt 146 lb (66.225 kg)  BMI 25.05 kg/m2  SpO2 99%   CONSTITUTIONAL: Alert and oriented and responds appropriately to questions. chronically ill appearing; well-nourished HEAD: Normocephalic EYES: Conjunctivae clear, PERRL ENT: normal nose; no rhinorrhea; moist mucous membranes NECK: Supple, no meningismus, no LAD  CARD: RRR; S1 and S2 appreciated; no murmurs, no clicks, no rubs, no gallops RESP: Normal chest excursion without splinting or tachypnea; breath sounds clear and equal bilaterally; no wheezes, no rhonchi, no rales, no hypoxia or respiratory distress, speaking full sentences diminshed at bases bilaterally  ABD/GI: Normal bowel sounds; non-distended; soft, non-tender, no rebound, no guarding, no peritoneal signs BACK:  The back appears normal and is non-tender to palpation, there is no CVA tenderness EXT: Normal ROM  in all joints; non-tender to palpation; no edema; normal capillary refill; no cyanosis, no calf tenderness or swelling    SKIN: Normal color for age and race; warm; no rash NEURO: Moves all extremities equally, sensation to light touch intact diffusely, cranial nerves II through XII intact PSYCH: The patient's mood and manner are appropriate. Grooming and personal hygiene are appropriate.  MEDICAL DECISION MAKING: Patient here with COPD exacerbation. Received albuterol & soluMedrol with EMS and artery reports feeling much better. He does have diminished aeration is bases but otherwise his lungs are clear. Denies any current chest pain. Hemodynamic is stable. Will give another breathing treatment, obtain labs, chest x-ray.  ED PROGRESS: Patient's labs show mild leukocytosis but he is chronically on prednisone. Troponin negative. Chest x-ray shows no acute findings. Aeration of his lungs improved after Xopenex and Atrovent. He is able to ambulate without  any respiratory distress. Sats did not drop below 94% and heart rate does not go above 90. He states he is feeling much better. I feel he is safe to be discharged home. Will discharge with prescription for Xopenex inhaler as he feels that this works better for him. Will also discharge with steroid burst. He states he is no longer smoking. Does have oxygen at home. Discussed return precautions. He also has a PCP for follow-up. Patient is comfortable with this plan.    At this time, I do not feel there is any life-threatening condition present. I have reviewed and discussed all results (EKG, imaging, lab, urine as appropriate), exam findings with patient. I have reviewed nursing notes and appropriate previous records.  I feel the patient is safe to be discharged home without further emergent workup. Discussed usual and customary return precautions. Patient and family (if present) verbalize understanding and are comfortable with this plan.  Patient will  follow-up with their primary care provider. If they do not have a primary care provider, information for follow-up has been provided to them. All questions have been answered.    EKG Interpretation  Date/Time:  Wednesday Mar 19 2016 22:23:17 EDT Ventricular Rate:  79 PR Interval:  121 QRS Duration: 86 QT Interval:  359 QTC Calculation: 411 R Axis:   60 Text Interpretation:  Sinus rhythm Atrial premature complex Probable left atrial enlargement RSR' in V1 or V2, right VCD or RVH no longer in atrial fibrillation compared to prior EKG Confirmed by Kaylor Maiers,  DO, Jahzara Slattery [phone]) on 03/19/2016 11:14:22 PM        I personally performed the services described in this documentation, which was scribed in my presence. The recorded information has been reviewed and is accurate.      [place], DO 03/20/16 [phone]

## 2016-03-19 NOTE — ED Notes (Signed)
Pt in by ems for sob, used his own nebs at home without relief.  Pt given 125 solumedrol en route and albuterol neb 2.5.  Pt denies cp

## 2016-03-20 MED ORDER — LEVALBUTEROL TARTRATE 45 MCG/ACT IN AERO: 2.0000 | INHALATION_SPRAY | RESPIRATORY_TRACT | Status: DC | PRN

## 2016-03-20 MED ORDER — PREDNISONE 20 MG PO TABS: 60.0000 mg | ORAL_TABLET | Freq: Every day | ORAL | Status: DC

## 2016-03-20 NOTE — Discharge Instructions (Signed)

## 2016-03-20 NOTE — ED Notes (Signed)
Ambulated patient around nurses station 2x sats stayed at 94% and HR 89. Made doctor aware of results.

## 2016-03-20 NOTE — ED Notes (Signed)
Pt states understanding of care given and follow up instructions.  Called daughter for transportation home.  Ambulated from ED

## 2016-03-27 ENCOUNTER — Emergency Department (HOSPITAL_COMMUNITY): Payer: Medicare Other

## 2016-03-27 ENCOUNTER — Emergency Department (HOSPITAL_COMMUNITY)
Admission: EM | Admit: 2016-03-27 | Discharge: 2016-03-27 | Disposition: A | Discharge status: Home / Self Care | Payer: Medicare Other | Attending: Emergency Medicine | Admitting: Emergency Medicine

## 2016-03-27 ENCOUNTER — Encounter (HOSPITAL_COMMUNITY): Payer: Self-pay | Admitting: Emergency Medicine

## 2016-03-27 DIAGNOSIS — I252 Old myocardial infarction: Secondary | ICD-10-CM | POA: Diagnosis not present

## 2016-03-27 DIAGNOSIS — Z79899 Other long term (current) drug therapy: Secondary | ICD-10-CM | POA: Insufficient documentation

## 2016-03-27 DIAGNOSIS — I2510 Atherosclerotic heart disease of native coronary artery without angina pectoris: Secondary | ICD-10-CM | POA: Insufficient documentation

## 2016-03-27 DIAGNOSIS — I739 Peripheral vascular disease, unspecified: Secondary | ICD-10-CM | POA: Diagnosis not present

## 2016-03-27 DIAGNOSIS — E785 Hyperlipidemia, unspecified: Secondary | ICD-10-CM | POA: Insufficient documentation

## 2016-03-27 DIAGNOSIS — R0789 Other chest pain: Secondary | ICD-10-CM

## 2016-03-27 DIAGNOSIS — Z87891 Personal history of nicotine dependence: Secondary | ICD-10-CM | POA: Diagnosis not present

## 2016-03-27 DIAGNOSIS — I4891 Unspecified atrial fibrillation: Secondary | ICD-10-CM | POA: Diagnosis not present

## 2016-03-27 DIAGNOSIS — J441 Chronic obstructive pulmonary disease with (acute) exacerbation: Secondary | ICD-10-CM | POA: Diagnosis not present

## 2016-03-27 DIAGNOSIS — R062 Wheezing: Secondary | ICD-10-CM | POA: Diagnosis present

## 2016-03-27 HISTORY — DX: Other chronic pain: G89.29

## 2016-03-27 HISTORY — DX: Chronic pain syndrome: G89.4

## 2016-03-27 HISTORY — DX: Pain in thoracic spine: M54.6

## 2016-03-27 LAB — CBC WITH DIFFERENTIAL/PLATELET
BASOS ABS: 0 10*3/uL (ref 0.0–0.1)
BASOS PCT: 0 %
EOS ABS: 0 10*3/uL (ref 0.0–0.7)
EOS PCT: 0 %
HCT: 42.3 % (ref 39.0–52.0)
Hemoglobin: 13 g/dL (ref 13.0–17.0)
Lymphocytes Relative: 6 %
Lymphs Abs: 0.6 10*3/uL — ABNORMAL LOW (ref 0.7–4.0)
MCH: 28.2 pg (ref 26.0–34.0)
MCHC: 30.7 g/dL (ref 30.0–36.0)
MCV: 91.8 fL (ref 78.0–100.0)
MONO ABS: 0.7 10*3/uL (ref 0.1–1.0)
Monocytes Relative: 6 %
Neutro Abs: 10.3 10*3/uL — ABNORMAL HIGH (ref 1.7–7.7)
Neutrophils Relative %: 88 %
PLATELETS: 201 10*3/uL (ref 150–400)
RBC: 4.61 MIL/uL (ref 4.22–5.81)
RDW: 14.5 % (ref 11.5–15.5)
WBC: 11.6 10*3/uL — AB (ref 4.0–10.5)

## 2016-03-27 LAB — COMPREHENSIVE METABOLIC PANEL
ALT: 25 U/L (ref 17–63)
AST: 25 U/L (ref 15–41)
Albumin: 3.6 g/dL (ref 3.5–5.0)
Alkaline Phosphatase: 54 U/L (ref 38–126)
Anion gap: 8 (ref 5–15)
BUN: 13 mg/dL (ref 6–20)
CHLORIDE: 99 mmol/L — AB (ref 101–111)
CO2: 31 mmol/L (ref 22–32)
Calcium: 8.9 mg/dL (ref 8.9–10.3)
Creatinine, Ser: 0.64 mg/dL (ref 0.61–1.24)
GFR calc Af Amer: >60 mL/min (ref >60)
GLUCOSE: 149 mg/dL — AB (ref 65–99)
POTASSIUM: 4.6 mmol/L (ref 3.5–5.1)
SODIUM: 138 mmol/L (ref 135–145)
Total Bilirubin: 1 mg/dL (ref 0.3–1.2)
Total Protein: 7 g/dL (ref 6.5–8.1)

## 2016-03-27 LAB — TROPONIN I

## 2016-03-27 MED ORDER — ALBUTEROL (5 MG/ML) CONTINUOUS INHALATION SOLN
10.0000 mg/h | INHALATION_SOLUTION | Freq: Once | RESPIRATORY_TRACT | Status: AC
  Administered 2016-03-27: 10 mg/h via RESPIRATORY_TRACT
  Filled 2016-03-27: qty 20

## 2016-03-27 MED ORDER — FENTANYL CITRATE (PF) 100 MCG/2ML IJ SOLN
25.0000 ug | INTRAMUSCULAR | Status: DC | PRN
  Administered 2016-03-27: 25 ug via INTRAVENOUS
  Filled 2016-03-27: qty 2

## 2016-03-27 MED ORDER — IOPAMIDOL (ISOVUE-370) INJECTION 76%: 100.0000 mL | Freq: Once | INTRAVENOUS | Status: DC | PRN

## 2016-03-27 MED ORDER — IPRATROPIUM BROMIDE 0.02 % IN SOLN
1.0000 mg | Freq: Once | RESPIRATORY_TRACT | Status: AC
  Administered 2016-03-27: 1 mg via RESPIRATORY_TRACT
  Filled 2016-03-27: qty 5

## 2016-03-27 MED ORDER — METHOCARBAMOL 500 MG PO TABS: 1000.0000 mg | ORAL_TABLET | Freq: Four times a day (QID) | ORAL | Status: DC | PRN

## 2016-03-27 MED ORDER — MORPHINE SULFATE (PF) 2 MG/ML IV SOLN
2.0000 mg | INTRAVENOUS | Status: DC | PRN
  Administered 2016-03-27: 2 mg via INTRAVENOUS
  Filled 2016-03-27: qty 1

## 2016-03-27 MED ORDER — IOPAMIDOL (ISOVUE-370) INJECTION 76%
100.0000 mL | Freq: Once | INTRAVENOUS | Status: AC | PRN
  Administered 2016-03-27: 100 mL via INTRAVENOUS

## 2016-03-27 MED ORDER — METHYLPREDNISOLONE SODIUM SUCC 125 MG IJ SOLR
125.0000 mg | Freq: Once | INTRAMUSCULAR | Status: AC
  Administered 2016-03-27: 125 mg via INTRAVENOUS
  Filled 2016-03-27: qty 2

## 2016-03-27 MED ORDER — IPRATROPIUM-ALBUTEROL 0.5-2.5 (3) MG/3ML IN SOLN
3.0000 mL | Freq: Once | RESPIRATORY_TRACT | Status: AC
  Administered 2016-03-27: 3 mL via RESPIRATORY_TRACT
  Filled 2016-03-27: qty 3

## 2016-03-27 MED ORDER — ALPRAZOLAM 0.5 MG PO TABS
0.2500 mg | ORAL_TABLET | Freq: Once | ORAL | Status: AC
  Administered 2016-03-27: 0.25 mg via ORAL
  Filled 2016-03-27: qty 1

## 2016-03-27 MED ORDER — DOXYCYCLINE HYCLATE 100 MG PO TABS: 100.0000 mg | ORAL_TABLET | Freq: Two times a day (BID) | ORAL | Status: DC

## 2016-03-27 MED ORDER — PREDNISONE 20 MG PO TABS: 40.0000 mg | ORAL_TABLET | Freq: Every day | ORAL | Status: DC

## 2016-03-27 NOTE — ED Notes (Signed)
Patient ambulated to the nurses station with 95-100% on O2 with 3L. No issues while ambulating.

## 2016-03-27 NOTE — ED Provider Notes (Signed)
CSN: [id number]     Arrival date & time 03/27/16  1600 History   First MD Initiated Contact with Patient 03/27/16 1619     Chief Complaint  Patient presents with  . Chest Pain  . Wheezing      HPI Pt was seen at 1650.  Per pt, c/o gradual onset and worsening of persistent cough, wheezing and SOB for the past 2 days.  Describes his symptoms as "my COPD is acting up."  Has been using home O2, MDI and nebs without relief. Has been associated with constant left sided chest wall "aching." Denies palpitations, no change in chronic back pain, no abd pain, no N/V/D, no fevers, no rash.     Past Medical History  Diagnosis Date  . GERD (gastroesophageal reflux disease)   . COPD (chronic obstructive pulmonary disease) ([place])     severe by PFTs August 2010  . 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 Atrial fibrillation ([facility])     post-op a-fib. 3/11. post Aortobifem , /  Atrial fibrillation from nebulizer treatment  May, 2012, while hospitalized  . Renal artery stenosis ([place])      presumably corrected at time of aortobifem surgery March, 2011  . Tobacco user      stop smoking  . Pneumothorax      2011  before aortobifemoral surgery,  resolved  . Peripheral vascular disease, unspecified ([place])     bilateral intermittent claudication  . CAD (coronary artery disease), native coronary artery     catheterization 06/2009.Marland KitchenMarland Kitchen70% proximal LAD and 30% proximal RCA. normal LV function, no intervention planned prior to surgery for aorta. LV normal function  by catheter 8/10.   [name] Dyslipidemia   . DJD (degenerative joint disease)   . Ejection fraction     60%, echo, May, 2012, rapid atrial fib at the time  . Cancer [facility]) 2013    Prostate Radiation  . Myocardial infarction ([place]) 2011  . On home O2     2L N/C  . Chronic thoracic back pain   . Chronic pain syndrome    Past Surgical History  Procedure Laterality Date   . Abdominal aortic aneurysm repair  01/11/2010  . Pr vein bypass graft,aorto-fem-pop    . Inguinal hernia repair      RIGHT   Family History  Problem Relation Age of Onset  . Cancer Father     prostate  . Heart attack Father    Social History  Substance Use Topics  . Smoking status: Former Smoker -- 2.00 packs/day for 40 years    Types: Cigarettes    Quit date: 01/11/2010  . Smokeless tobacco: Never Used  . Alcohol Use: No    Review of Systems ROS: Statement: All systems negative except as marked or noted in the HPI; Constitutional: Negative for fever and chills. ; ; Eyes: Negative for eye pain, redness and discharge. ; ; ENMT: Negative for ear pain, hoarseness, nasal congestion, sinus pressure and sore throat. ; ; Cardiovascular: Negative for chest pain, palpitations, diaphoresis, and peripheral edema. ; ; Respiratory: +cough, wheezing, SOB. Negative for stridor. ; ; Gastrointestinal: Negative for nausea, vomiting, diarrhea, abdominal pain, blood in stool, hematemesis, jaundice and rectal bleeding. . ; ; Genitourinary: Negative for dysuria, flank pain and hematuria. ; ; Musculoskeletal: +chest wall pain. Negative for back pain and neck pain. Negative for swelling and trauma.; ;  Skin: Negative for pruritus, rash, abrasions, blisters, bruising and skin lesion.; ; Neuro: Negative for headache, lightheadedness and neck stiffness. Negative for weakness, altered level of consciousness, altered mental status, extremity weakness, paresthesias, involuntary movement, seizure and syncope.      Allergies  Levaquin  Home Medications   Prior to Admission medications   Medication Sig Start Date End Date Taking? Authorizing Provider  albuterol (PROVENTIL HFA;VENTOLIN HFA) 108 (90 BASE) MCG/ACT inhaler Inhale 2 puffs into the lungs every 4 (four) hours as needed for wheezing or shortness of breath. 09/19/15   Donne [name], MD  albuterol (PROVENTIL) (2.5 MG/3ML) 0.083% nebulizer solution Take 3  mLs (2.5 mg total) by nebulization every 4 (four) hours as needed for wheezing or shortness of breath. 03/15/15   [name], MD  ALPRAZolam [name]) 0.25 MG tablet Take 0.25 mg by mouth at bedtime. 12/17/15   Historical Provider, MD  digoxin (LANOXIN) 0.125 MG tablet Take 1 tablet (0.125 mg total) by mouth daily. 01/10/16   [name], MD  ELIQUIS 5 MG TABS tablet TAKE 1 TABLET BY MOUTH TWICE A DAY 02/11/16   Satira Sark, MD  gabapentin (NEURONTIN) 300 MG capsule Take two capsules by mouth twice daily 10/30/15   Historical Provider, MD  HYDROcodone-acetaminophen (NORCO/VICODIN) 5-325 MG tablet Take 1 tablet by mouth 2 (two) times daily. 05/24/16 06/23/16  Historical Provider, MD  ipratropium (ATROVENT) 0.02 % nebulizer solution Take 0.5 mg by nebulization every 4 (four) hours as needed for wheezing or shortness of breath.    Historical Provider, MD  levalbuterol [facility] HFA) 45 MCG/ACT inhaler Inhale 2 puffs into the lungs every 4 (four) hours as needed for wheezing. 03/20/16   [name], DO  metoprolol (LOPRESSOR) 50 MG tablet Take 50 mg by mouth 2 (two) times daily. 12/27/13   Historical Provider, MD  nitroGLYCERIN (NITROSTAT) 0.4 MG SL tablet Place 1 tablet (0.4 mg total) under the tongue every 5 (five) minutes x 3 doses as needed for chest pain. 06/11/15   [name], MD  omeprazole (PRILOSEC) 20 MG capsule Take 1 capsule by mouth every morning.  04/21/11   Historical Provider, MD  predniSONE (DELTASONE) 10 MG tablet Take 10 mg by mouth daily. 03/11/16   Historical Provider, MD  predniSONE (DELTASONE) 20 MG tablet Take 3 tablets (60 mg total) by mouth daily. 03/20/16   Kristen N Ward, DO  simvastatin (ZOCOR) 40 MG tablet TAKE 1 TABLET (40 MG TOTAL) BY MOUTH EVERY EVENING. 08/29/15   [name], MD   BP 116/90 mmHg  Pulse 91  Temp(Src) 97.6 F (36.4 C) (Oral)  Resp 18  Ht 5\' 4"  (1.626 m)  Wt 146 lb (66.225 kg)  BMI 25.05 kg/m2  SpO2 100% Physical Exam  1655: Physical  examination:  Nursing notes reviewed; Vital signs and O2 SAT reviewed;  Constitutional: Well developed, Well nourished, Well hydrated, Uncomfortable appearing.; Head:  Normocephalic, atraumatic; Eyes: EOMI, PERRL, No scleral icterus; ENMT: Mouth and pharynx normal, Mucous membranes moist; Neck: Supple, Full range of motion, No lymphadenopathy; Cardiovascular: Regular rate and rhythm, No gallop; Respiratory: Breath sounds diminished & equal bilaterally, insp/exp wheezes bilat with faint audible wheezing. Speaking phrases. Sitting upright. Tachypneic.; Chest: +left lateral chest wall tender to palp. No rash, no soft tissue crepitus. Movement normal; Abdomen: Soft, Nontender, Nondistended, Normal bowel sounds; Genitourinary: No CVA tenderness; Extremities: Pulses normal, No tenderness, No edema, No calf edema or asymmetry.; Neuro: AA&Ox3, Major CN grossly intact.  Speech clear. No gross  focal motor or sensory deficits in extremities.; Skin: Color normal, Warm, Dry.   ED Course  Procedures (including critical care time) Labs Review   Imaging Review  I have personally reviewed and evaluated these images and lab results as part of my medical decision-making.   EKG Interpretation   Date/Time:  Thursday Mar 27 2016 16:14:30 EDT Ventricular Rate:  81 PR Interval:  120 QRS Duration: 81 QT Interval:  352 QTC Calculation: 408 R Axis:   48 Text Interpretation:  Sinus rhythm RSR' in V1 or V2, right VCD or RVH  Baseline wander Artifact When compared with ECG of 03/19/2016 No  significant change was found Confirmed by [facility]  MD, Nunzio Cory [phone]) on  03/27/2016 6:22:14 PM      MDM  MDM Reviewed: previous chart, nursing note and vitals Reviewed previous: labs and ECG Interpretation: labs, ECG and x-ray     ED ECG REPORT   Date: 03/27/2016  Rate: 79  Rhythm: normal sinus rhythm and premature atrial contractions (PAC)  QRS Axis: normal  Intervals: normal  ST/T Wave abnormalities: normal   Conduction Disutrbances:none  Narrative Interpretation:   Old EKG Reviewed: unchanged; no significant changes compared to previous EKG dated 03/19/16.   Results for orders placed or performed during the hospital encounter of 03/27/16  CBC with Differential  Result Value Ref Range   WBC 11.6 (H) 4.0 - 10.5 K/uL   RBC 4.61 4.22 - 5.81 MIL/uL   Hemoglobin 13.0 13.0 - 17.0 g/dL   HCT 42.3 39.0 - 52.0 %   MCV 91.8 78.0 - 100.0 fL   MCH 28.2 26.0 - 34.0 pg   MCHC 30.7 30.0 - 36.0 g/dL   RDW 14.5 11.5 - 15.5 %   Platelets 201 150 - 400 K/uL   Neutrophils Relative % 88 %   Neutro Abs 10.3 (H) 1.7 - 7.7 K/uL   Lymphocytes Relative 6 %   Lymphs Abs 0.6 (L) 0.7 - 4.0 K/uL   Monocytes Relative 6 %   Monocytes Absolute 0.7 0.1 - 1.0 K/uL   Eosinophils Relative 0 %   Eosinophils Absolute 0.0 0.0 - 0.7 K/uL   Basophils Relative 0 %   Basophils Absolute 0.0 0.0 - 0.1 K/uL  Comprehensive metabolic panel  Result Value Ref Range   Sodium 138 135 - 145 mmol/L   Potassium 4.6 3.5 - 5.1 mmol/L   Chloride 99 (L) 101 - 111 mmol/L   CO2 31 22 - 32 mmol/L   Glucose, Bld 149 (H) 65 - 99 mg/dL   BUN 13 6 - 20 mg/dL   Creatinine, Ser 0.64 0.61 - 1.24 mg/dL   Calcium 8.9 8.9 - 10.3 mg/dL   Total Protein 7.0 6.5 - 8.1 g/dL   Albumin 3.6 3.5 - 5.0 g/dL   AST 25 15 - 41 U/L   ALT 25 17 - 63 U/L   Alkaline Phosphatase 54 38 - 126 U/L   Total Bilirubin 1.0 0.3 - 1.2 mg/dL   GFR calc non Af Amer >60 >60 mL/min   GFR calc Af Amer >60 >60 mL/min   Anion gap 8 5 - 15  Troponin I  Result Value Ref Range   Troponin I <0.03 <0.031 ng/mL    Dg Chest 2 View 03/27/2016  CLINICAL DATA:  LEFT-sided chest pain.  Increased short of breath EXAM: CHEST  2 VIEW COMPARISON:  03/19/2016 FINDINGS: Lungs are hyperinflated. Normal cardiac silhouette. No effusion, infiltrate pneumothorax. Biapical pleural thickening. Compression deformities in the mid thoracic  spine with mild kyphosis. IMPRESSION: 1.  No acute  cardiopulmonary process. 2. Hyperinflated lungs suggest emphysema. Electronically Signed   By: Suzy Bouchard M.D.   On: 03/27/2016 16:48   Ct Angio Chest Pe W/cm &/or Wo Cm 03/27/2016  CLINICAL DATA:  65 year old male with COPD with increasing shortness of breath. EXAM: CT ANGIOGRAPHY CHEST WITH CONTRAST TECHNIQUE: Multidetector CT imaging of the chest was performed using the standard protocol during bolus administration of intravenous contrast. Multiplanar CT image reconstructions and MIPs were obtained to evaluate the vascular anatomy. CONTRAST:  100 cc Isovue 370 COMPARISON:  Chest radiograph dated 03/27/2016 FINDINGS: Evaluation of this exam is limited due to respiratory motion artifact. There is emphysematous changes of the lungs with biapical scarring. There is no focal consolidation, pleural effusion, or pneumothorax. The central airways are patent. There is mild atherosclerotic calcification of the thoracic aorta. No aneurysmal dilatation or dissection. The origins of the great vessels of the aortic arch appear patent. No CT evidence of pulmonary embolism. Evaluation however is limited due to respiratory motion artifact. There is no cardiomegaly or pericardial effusion. No hilar or mediastinal adenopathy. The esophagus and the thyroid gland are grossly unremarkable. There is no axillary adenopathy. The chest wall soft tissues appear unremarkable. There is osteopenia with degenerative changes of the spine. There is age indeterminate compression deformity of the T7 vertebra with approximately 50% loss of vertebral body height and anterior wedging. Clinical correlation is recommended. Mild age indeterminate compression deformities of the T8 and T9 vertebrae is also noted. No definite acute fracture identified. Review of the MIP images confirms the above findings. IMPRESSION: No CT evidence of pulmonary embolism. Emphysema.  No focal consolidation or pneumothorax. Age indeterminate compression fracture of  the T7 vertebrae. Clinical correlation is recommended. Electronically Signed   By: Anner Crete M.D.   On: 03/27/2016 22:24    2230:  Workup reassuring. Doubt PE as cause for symptoms with negative CT-A chest.  Doubt ACS as cause for symptoms with normal troponin and unchanged EKG from previous after 2 days of constant symptoms.  After multiple doses of IV pain meds and a dose of his usual xanax; pt now states he "feels better" and wants to go home. NAD, lungs coarse bilat, no wheezing, resps easy, speaking full sentences, Sats 97-100% on his usual O2 3L N/C. Pt ambulated around the ED with Sats remaining 95-100 % on his usual 3L O2 N/C, resps easy, NAD. Tx symptomatically for COPD exacerbation (pt already has narcotic pain med rx).  Dx and testing d/w pt.  Questions answered.  Verb understanding, agreeable to d/c home with outpt f/u.       Francine Graven, DO 03/30/16 [phone]

## 2016-03-27 NOTE — Discharge Instructions (Signed)
Take the prescriptions as directed.  Apply moist heat or ice to the area(s) of discomfort, for 15 minutes at a time, several times per day for the next few days.  Do not fall asleep on a heating or ice pack. Use your albuterol inhaler (2 to 4 puffs) or your albuterol nebulizer (1 unit dose) every 4 hours for the next 7 days, then as needed for cough, wheezing, or shortness of breath.  Call your regular medical doctor tomorrow morning to schedule a follow up appointment within the next 2 days.  Return to the Emergency Department immediately sooner if worsening.

## 2016-03-27 NOTE — ED Notes (Signed)
MD at bedside. 

## 2016-03-27 NOTE — ED Notes (Signed)
PT c/o left sided chest pain with increased SOB.

## 2016-03-29 ENCOUNTER — Emergency Department (HOSPITAL_COMMUNITY): Payer: Medicare Other

## 2016-03-29 ENCOUNTER — Encounter (HOSPITAL_COMMUNITY): Payer: Self-pay | Admitting: Emergency Medicine

## 2016-03-29 ENCOUNTER — Emergency Department (HOSPITAL_COMMUNITY)
Admission: EM | Admit: 2016-03-29 | Discharge: 2016-03-29 | Disposition: A | Discharge status: Home / Self Care | Payer: Medicare Other | Attending: Emergency Medicine | Admitting: Emergency Medicine

## 2016-03-29 DIAGNOSIS — I4891 Unspecified atrial fibrillation: Secondary | ICD-10-CM | POA: Diagnosis not present

## 2016-03-29 DIAGNOSIS — IMO0002 Reserved for concepts with insufficient information to code with codable children: Secondary | ICD-10-CM

## 2016-03-29 DIAGNOSIS — Y999 Unspecified external cause status: Secondary | ICD-10-CM | POA: Diagnosis not present

## 2016-03-29 DIAGNOSIS — Z79899 Other long term (current) drug therapy: Secondary | ICD-10-CM | POA: Insufficient documentation

## 2016-03-29 DIAGNOSIS — Z8546 Personal history of malignant neoplasm of prostate: Secondary | ICD-10-CM | POA: Diagnosis not present

## 2016-03-29 DIAGNOSIS — I739 Peripheral vascular disease, unspecified: Secondary | ICD-10-CM | POA: Diagnosis not present

## 2016-03-29 DIAGNOSIS — I252 Old myocardial infarction: Secondary | ICD-10-CM | POA: Diagnosis not present

## 2016-03-29 DIAGNOSIS — S229XXA Fracture of bony thorax, part unspecified, initial encounter for closed fracture: Secondary | ICD-10-CM | POA: Insufficient documentation

## 2016-03-29 DIAGNOSIS — Z87891 Personal history of nicotine dependence: Secondary | ICD-10-CM | POA: Diagnosis not present

## 2016-03-29 DIAGNOSIS — X58XXXA Exposure to other specified factors, initial encounter: Secondary | ICD-10-CM | POA: Insufficient documentation

## 2016-03-29 DIAGNOSIS — I2510 Atherosclerotic heart disease of native coronary artery without angina pectoris: Secondary | ICD-10-CM | POA: Diagnosis not present

## 2016-03-29 DIAGNOSIS — E785 Hyperlipidemia, unspecified: Secondary | ICD-10-CM | POA: Insufficient documentation

## 2016-03-29 DIAGNOSIS — R079 Chest pain, unspecified: Secondary | ICD-10-CM | POA: Diagnosis present

## 2016-03-29 DIAGNOSIS — J449 Chronic obstructive pulmonary disease, unspecified: Secondary | ICD-10-CM | POA: Insufficient documentation

## 2016-03-29 DIAGNOSIS — Y929 Unspecified place or not applicable: Secondary | ICD-10-CM | POA: Diagnosis not present

## 2016-03-29 DIAGNOSIS — Y939 Activity, unspecified: Secondary | ICD-10-CM | POA: Diagnosis not present

## 2016-03-29 LAB — BRAIN NATRIURETIC PEPTIDE: B NATRIURETIC PEPTIDE 5: 73 pg/mL (ref 0.0–100.0)

## 2016-03-29 LAB — CBC WITH DIFFERENTIAL/PLATELET
BASOS PCT: 0 %
Basophils Absolute: 0 10*3/uL (ref 0.0–0.1)
Eosinophils Absolute: 0 10*3/uL (ref 0.0–0.7)
Eosinophils Relative: 0 %
HEMATOCRIT: 42.4 % (ref 39.0–52.0)
HEMOGLOBIN: 13 g/dL (ref 13.0–17.0)
LYMPHS ABS: 0.6 10*3/uL — AB (ref 0.7–4.0)
Lymphocytes Relative: 4 %
MCH: 28.2 pg (ref 26.0–34.0)
MCHC: 30.7 g/dL (ref 30.0–36.0)
MCV: 92 fL (ref 78.0–100.0)
MONOS PCT: 6 %
Monocytes Absolute: 0.8 10*3/uL (ref 0.1–1.0)
NEUTROS ABS: 11.6 10*3/uL — AB (ref 1.7–7.7)
NEUTROS PCT: 90 %
Platelets: 213 10*3/uL (ref 150–400)
RBC: 4.61 MIL/uL (ref 4.22–5.81)
RDW: 14.7 % (ref 11.5–15.5)
WBC: 13 10*3/uL — ABNORMAL HIGH (ref 4.0–10.5)

## 2016-03-29 LAB — BASIC METABOLIC PANEL
ANION GAP: 9 (ref 5–15)
BUN: 18 mg/dL (ref 6–20)
CALCIUM: 9.1 mg/dL (ref 8.9–10.3)
CHLORIDE: 102 mmol/L (ref 101–111)
CO2: 28 mmol/L (ref 22–32)
Creatinine, Ser: 0.61 mg/dL (ref 0.61–1.24)
GFR calc non Af Amer: >60 mL/min (ref >60)
Glucose, Bld: 182 mg/dL — ABNORMAL HIGH (ref 65–99)
POTASSIUM: 4.9 mmol/L (ref 3.5–5.1)
Sodium: 139 mmol/L (ref 135–145)

## 2016-03-29 LAB — TROPONIN I: Troponin I: <0.03 ng/mL (ref <0.031)

## 2016-03-29 MED ORDER — IPRATROPIUM-ALBUTEROL 0.5-2.5 (3) MG/3ML IN SOLN
3.0000 mL | Freq: Once | RESPIRATORY_TRACT | Status: AC
  Administered 2016-03-29: 3 mL via RESPIRATORY_TRACT
  Filled 2016-03-29: qty 3

## 2016-03-29 MED ORDER — OXYCODONE HCL ER 10 MG PO T12A: 10.0000 mg | EXTENDED_RELEASE_TABLET | Freq: Two times a day (BID) | ORAL | Status: DC

## 2016-03-29 NOTE — ED Notes (Signed)
Pt c/o continuing chest pain and difficulty breathing that started 4 days ago. Pt states his pain is not controlled with medication.

## 2016-03-29 NOTE — Discharge Instructions (Signed)
Spinal Compression Fracture °A spinal compression fracture is a collapse of the bones that form the spine (vertebrae). With this type of fracture, the vertebrae become squashed (compressed) into a wedge shape. Most compression fractures happen in the middle or lower part of the spine. °CAUSES °This condition may be caused by: °· Thinning and loss of density in the bones (osteoporosis). This is the most common cause. °· A fall. °· A car or motorcycle accident. °· Cancer. °· Trauma, such as a heavy, direct hit to the head. °RISK FACTORS °You may be at greater risk for a spinal compression fracture if you: °· Are 50 years old or older. °· Have osteoporosis. °· Have certain types of cancer, including: °¨ Multiple myeloma. °¨ Lymphoma. °¨ Prostate cancer. °¨ Lung cancer. °¨ Breast cancer. °SYMPTOMS °Symptoms of this condition include: °· Severe pain. °· Pain that gets worse over time. °· Pain that is worse when you stand, walk, sit, or bend. °· Sudden pain that is so bad that it is hard for you to move. °· Bending or humping of the spine. °· Gradual loss of height. °· Numbness, tingling, or weakness in the back and legs. °· Trouble walking. °Your symptoms will depend on the cause of the fracture and how quickly it develops. For example, fractures that are caused by osteoporosis can cause few symptoms, no symptoms, or symptoms that develop slowly over time. °DIAGNOSIS °This condition may be diagnosed based on symptoms, medical history, and a physical exam. During the physical exam, your health care provider may tap along the length of your spine to check for tenderness. Tests may be done to confirm the diagnosis. They may include: °· A bone density test to check for osteoporosis. °· Imaging tests, such as a spine X-ray, a CT scan, or MRI. °TREATMENT °Treatment for this condition depends on the cause and severity of the condition. Some fractures, such as those that are caused by osteoporosis, may heal on their own with  supportive care. This may include: °· Pain medicine. °· Rest. °· A back brace. °· Physical therapy exercises. °· Medicine that reduces bone pain. °· Calcium and vitamin D supplements. °Fractures that cause the back to become misshapen, cause nerve pain or weakness, or do not respond to other treatment may be treated with a surgical procedure, such as: °· Vertebroplasty. In this procedure, bone cement is injected into the collapsed vertebrae to stabilize them. °· Balloon kyphoplasty. In this procedure, the collapsed vertebrae are expanded with a balloon and then bone cement is injected into them. °· Spinal fusion. In this procedure, the collapsed vertebrae are connected (fused) to normal vertebrae. °HOME CARE INSTRUCTIONS °General Instructions °· Take medicines only as directed by your health care provider. °· Do not drive or operate heavy machinery while taking pain medicine. °· If directed, apply ice to the injured area: °¨ Put ice in a plastic bag. °¨ Place a towel between your skin and the bag. °¨ Leave the ice on for 30 minutes every two hours at first. Then apply the ice as needed. °· Wear your neck brace or back brace as directed by your health care provider. °· Do not drink alcohol. Alcohol can interfere with your treatment. °· Keep all follow-up visits as directed by your health care provider. This is important. It can help to prevent permanent injury, disability, and long-lasting (chronic) pain. °Activity °· Stay in bed (on bed rest) only as directed by your health care provider. Being on bed rest for too long can   make your condition worse.  Return to your normal activities as directed by your health care provider. Ask what activities are safe for you.  Do exercises to improve motion and strength in your back (physical therapy), as recommended by your health care provider.  Exercise regularly as directed by your health care provider. SEEK MEDICAL CARE IF:  You have a fever.  You develop a cough  that makes your pain worse.  Your pain medicine is not helping.  Your pain does not get better over time.  You cannot return to your normal activities as planned or expected. SEEK IMMEDIATE MEDICAL CARE IF:  Your pain is very bad and it suddenly gets worse.  You are unable to move any body part (paralysis) that is below the level of your injury.  You have numbness, tingling, or weakness in any body part that is below the level of your injury.  You cannot control your bladder or bowels.   This information is not intended to replace advice given to you by your health care provider. Make sure you discuss any questions you have with your health care provider.   Document Released: 10/27/2005 Document Revised: 03/13/2015 Document Reviewed: 10/31/2014 Elsevier Interactive Patient Education [employer].

## 2016-03-29 NOTE — ED Notes (Signed)
Pt made aware of pain med causes constipation and pt may need to take a stool softener or laxative as well.

## 2016-03-29 NOTE — ED Provider Notes (Signed)
CSN: [id number]     Arrival date & time 03/29/16  1700 History   First MD Initiated Contact with Patient 03/29/16 1703     Chief Complaint  Patient presents with  . Chest Pain      HPI Patient presents with four-day history of chest pain and wheezing.  Pain not controlled his current medication.  Patient had workup in emergency department which included CT angiogram which was negative.  Currently wears oxygen at home.  Satting 9500% on 3 L.  His pain is in his left axilla and left nipple area.  It is reproducible palpation. Past Medical History  Diagnosis Date  . GERD (gastroesophageal reflux disease)   . COPD (chronic obstructive pulmonary disease) ([place])     severe by PFTs August 2010  . 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 Atrial fibrillation ([facility])     post-op a-fib. 3/11. post Aortobifem , /  Atrial fibrillation from nebulizer treatment  May, 2012, while hospitalized  . Renal artery stenosis ([place])      presumably corrected at time of aortobifem surgery March, 2011  . Tobacco user      stop smoking  . Pneumothorax      2011  before aortobifemoral surgery,  resolved  . Peripheral vascular disease, unspecified ([place])     bilateral intermittent claudication  . CAD (coronary artery disease), native coronary artery     catheterization 06/2009.Marland KitchenMarland Kitchen70% proximal LAD and 30% proximal RCA. normal LV function, no intervention planned prior to surgery for aorta. LV normal function  by catheter 8/10.   [name] Dyslipidemia   . DJD (degenerative joint disease)   . Ejection fraction     60%, echo, May, 2012, rapid atrial fib at the time  . Cancer [facility] 2013    Prostate Radiation  . Myocardial infarction ([place]) 2011  . On home O2     2L N/C  . Chronic thoracic back pain   . Chronic pain syndrome    Past Surgical History  Procedure Laterality Date  . Abdominal aortic aneurysm repair  01/11/2010  . Pr vein  bypass graft,aorto-fem-pop    . Inguinal hernia repair      RIGHT   Family History  Problem Relation Age of Onset  . Cancer Father     prostate  . Heart attack Father    Social History  Substance Use Topics  . Smoking status: Former Smoker -- 2.00 packs/day for 40 years    Types: Cigarettes    Quit date: 01/11/2010  . Smokeless tobacco: Never Used  . Alcohol Use: No    Review of Systems    Allergies  Levaquin  Home Medications   Prior to Admission medications   Medication Sig Start Date End Date Taking? Authorizing Provider  acetaminophen (TYLENOL) 500 MG tablet Take 1,000 mg by mouth every 6 (six) hours as needed for mild pain.   Yes Historical Provider, MD  albuterol (PROVENTIL HFA;VENTOLIN HFA) 108 (90 BASE) MCG/ACT inhaler Inhale 2 puffs into the lungs every 4 (four) hours as needed for wheezing or shortness of breath. 09/19/15  Yes Donne [name], MD  albuterol (PROVENTIL) (2.5 MG/3ML) 0.083% nebulizer solution Take 3 mLs (2.5 mg total) by nebulization every 4 (four) hours as needed for wheezing or shortness of breath. 03/15/15  Yes [name], MD  ALPRAZolam [name]) 0.25 MG tablet Take 0.25 mg by  mouth at bedtime as needed for sleep.  12/17/15  Yes Historical Provider, MD  doxycycline (VIBRA-TABS) 100 MG tablet Take 1 tablet (100 mg total) by mouth 2 (two) times daily. 03/27/16  Yes Francine Graven, DO  ELIQUIS 5 MG TABS tablet TAKE 1 TABLET BY MOUTH TWICE A DAY 02/11/16  Yes Satira Sark, MD  gabapentin (NEURONTIN) 300 MG capsule Take two capsules by mouth twice daily 10/30/15  Yes Historical Provider, MD  ipratropium (ATROVENT) 0.02 % nebulizer solution Take 0.5 mg by nebulization every 4 (four) hours as needed for wheezing or shortness of breath.   Yes Historical Provider, MD  methocarbamol (ROBAXIN) 500 MG tablet Take 2 tablets (1,000 mg total) by mouth 4 (four) times daily as needed for muscle spasms (muscle spasm/pain). 03/27/16  Yes Francine Graven, DO    metoprolol (LOPRESSOR) 50 MG tablet Take 50 mg by mouth 2 (two) times daily. 12/27/13  Yes Historical Provider, MD  omeprazole (PRILOSEC) 20 MG capsule Take 1 capsule by mouth every morning.  04/21/11  Yes Historical Provider, MD  predniSONE (DELTASONE) 20 MG tablet Take 2 tablets (40 mg total) by mouth daily. Patient taking differently: Take 60 mg by mouth 2 (two) times daily.  03/27/16  Yes Francine Graven, DO  simvastatin (ZOCOR) 40 MG tablet TAKE 1 TABLET (40 MG TOTAL) BY MOUTH EVERY EVENING. 08/29/15  Yes [name], MD  digoxin (LANOXIN) 0.125 MG tablet Take 1 tablet (0.125 mg total) by mouth daily. Patient not taking: Reported on 03/27/2016 01/10/16   [name], MD  levalbuterol [facility] HFA) 45 MCG/ACT inhaler Inhale 2 puffs into the lungs every 4 (four) hours as needed for wheezing. 03/20/16   [name], DO  nitroGLYCERIN (NITROSTAT) 0.4 MG SL tablet Place 1 tablet (0.4 mg total) under the tongue every 5 (five) minutes x 3 doses as needed for chest pain. 06/11/15   [name], MD  oxyCODONE (OXYCONTIN) 10 mg 12 hr tablet Take 1 tablet (10 mg total) by mouth every 12 (twelve) hours. As needed for pain 03/29/16   Leonard Schwartz, MD   BP 105/82 mmHg  Pulse 102  Resp 20  Ht 5\' 4"  (1.626 m)  Wt 146 lb (66.225 kg)  BMI 25.05 kg/m2  SpO2 98% Physical Exam  Constitutional: He is oriented to person, place, and time. He appears well-developed and well-nourished. No distress.  HENT:  Head: Normocephalic and atraumatic.  Eyes: Pupils are equal, round, and reactive to light.  Neck: Normal range of motion.  Cardiovascular: Normal rate and intact distal pulses.   Pulmonary/Chest: He is in respiratory distress. He has wheezes.    Name reproducible to palpation were indicated  Abdominal: Normal appearance. He exhibits no distension.  Musculoskeletal: Normal range of motion.       Back:  Neurological: He is alert and oriented to person, place, and time. No cranial nerve  deficit.  Skin: Skin is warm and dry. No rash noted.  Psychiatric: He has a normal mood and affect. His behavior is normal.  Nursing note and vitals reviewed.   ED Course  Procedures (including critical care time) Labs Review Labs Reviewed  BASIC METABOLIC PANEL - Abnormal; Notable for the following:    Glucose, Bld 182 (*)    All other components within normal limits  CBC WITH DIFFERENTIAL/PLATELET - Abnormal; Notable for the following:    WBC 13.0 (*)    Neutro Abs 11.6 (*)    Lymphs Abs 0.6 (*)    All  other components within normal limits  TROPONIN I  BRAIN NATRIURETIC PEPTIDE    Imaging Review Dg Chest 2 View  03/29/2016  CLINICAL DATA:  Continuing chest pain and difficulty breathing starting 4 days ago. EXAM: CHEST  2 VIEW COMPARISON:  Chest x-rays dated 03/27/2016, 03/19/2016 and 09/19/2015. FINDINGS: Heart size is normal. Overall cardiomediastinal silhouette is stable in size and configuration. Lungs are hyperexpanded. Suspect chronic bronchitic changes centrally. Small areas of chronic scarring/ fibrosis are seen along the right lateral chest wall on at each lung apex, better characterized on recent chest CT. No evidence of pneumonia. No pleural effusion or pneumothorax seen. There are chronic-appearing compression fracture deformities within the mid thoracic spine, but the more superior vertebral body demonstrates slight progression of the compression compared to the earlier plain film exam of 11/20/2015. IMPRESSION: 1. Hyperexpanded lungs indicating COPD/emphysema. 2. No evidence of acute cardiopulmonary abnormality. No evidence of pneumonia. No pleural effusion or pneumothorax. 3. Chronic compression fracture deformity within the mid thoracic spine, but slightly increased compression compared to the earlier plain film of 11/20/2015. Any acute/recent symptoms within the mid thoracic spine? Milder compression deformity of the immediately subjacent thoracic vertebral body is stable.  Electronically Signed   By: Franki Cabot M.D.   On: 03/29/2016 18:35   Ct Angio Chest Pe W/cm &/or Wo Cm  03/27/2016  CLINICAL DATA:  65 year old male with COPD with increasing shortness of breath. EXAM: CT ANGIOGRAPHY CHEST WITH CONTRAST TECHNIQUE: Multidetector CT imaging of the chest was performed using the standard protocol during bolus administration of intravenous contrast. Multiplanar CT image reconstructions and MIPs were obtained to evaluate the vascular anatomy. CONTRAST:  100 cc Isovue 370 COMPARISON:  Chest radiograph dated 03/27/2016 FINDINGS: Evaluation of this exam is limited due to respiratory motion artifact. There is emphysematous changes of the lungs with biapical scarring. There is no focal consolidation, pleural effusion, or pneumothorax. The central airways are patent. There is mild atherosclerotic calcification of the thoracic aorta. No aneurysmal dilatation or dissection. The origins of the great vessels of the aortic arch appear patent. No CT evidence of pulmonary embolism. Evaluation however is limited due to respiratory motion artifact. There is no cardiomegaly or pericardial effusion. No hilar or mediastinal adenopathy. The esophagus and the thyroid gland are grossly unremarkable. There is no axillary adenopathy. The chest wall soft tissues appear unremarkable. There is osteopenia with degenerative changes of the spine. There is age indeterminate compression deformity of the T7 vertebra with approximately 50% loss of vertebral body height and anterior wedging. Clinical correlation is recommended. Mild age indeterminate compression deformities of the T8 and T9 vertebrae is also noted. No definite acute fracture identified. Review of the MIP images confirms the above findings. IMPRESSION: No CT evidence of pulmonary embolism. Emphysema.  No focal consolidation or pneumothorax. Age indeterminate compression fracture of the T7 vertebrae. Clinical correlation is recommended. Electronically  Signed   By: Anner Crete M.D.   On: 03/27/2016 22:24   I have personally reviewed and evaluated these images and lab results as part of my medical decision-making.   EKG Interpretation   Date/Time:  Saturday Mar 29 2016 17:08:35 EDT Ventricular Rate:  111 PR Interval:  115 QRS Duration: 76 QT Interval:  323 QTC Calculation: 439 R Axis:   -7 Text Interpretation:  Sinus tachycardia Abnormal R-wave progression, early  transition Baseline wander in lead(s) V3 Abnormal ekg Confirmed by Koby Hartfield   MD, Varnell Orvis ([id number]) on 03/29/2016 5:25:56 PM      MDM  Final diagnoses:  Compression fracture        Leonard Schwartz, MD 03/29/16 1904

## 2016-04-02 ENCOUNTER — Encounter (HOSPITAL_COMMUNITY): Payer: Self-pay | Admitting: Emergency Medicine

## 2016-04-02 ENCOUNTER — Emergency Department (HOSPITAL_COMMUNITY): Payer: Medicare Other

## 2016-04-02 ENCOUNTER — Emergency Department (HOSPITAL_COMMUNITY)
Admission: EM | Admit: 2016-04-02 | Discharge: 2016-04-02 | Disposition: A | Discharge status: Home / Self Care | Payer: Medicare Other | Attending: Emergency Medicine | Admitting: Emergency Medicine

## 2016-04-02 DIAGNOSIS — K219 Gastro-esophageal reflux disease without esophagitis: Secondary | ICD-10-CM | POA: Diagnosis not present

## 2016-04-02 DIAGNOSIS — R0602 Shortness of breath: Secondary | ICD-10-CM | POA: Diagnosis present

## 2016-04-02 DIAGNOSIS — Z79899 Other long term (current) drug therapy: Secondary | ICD-10-CM | POA: Diagnosis not present

## 2016-04-02 DIAGNOSIS — M1990 Unspecified osteoarthritis, unspecified site: Secondary | ICD-10-CM | POA: Diagnosis not present

## 2016-04-02 DIAGNOSIS — I252 Old myocardial infarction: Secondary | ICD-10-CM | POA: Diagnosis not present

## 2016-04-02 DIAGNOSIS — I4891 Unspecified atrial fibrillation: Secondary | ICD-10-CM | POA: Diagnosis not present

## 2016-04-02 DIAGNOSIS — G8929 Other chronic pain: Secondary | ICD-10-CM | POA: Insufficient documentation

## 2016-04-02 DIAGNOSIS — Z87891 Personal history of nicotine dependence: Secondary | ICD-10-CM | POA: Insufficient documentation

## 2016-04-02 DIAGNOSIS — I2510 Atherosclerotic heart disease of native coronary artery without angina pectoris: Secondary | ICD-10-CM | POA: Insufficient documentation

## 2016-04-02 DIAGNOSIS — J441 Chronic obstructive pulmonary disease with (acute) exacerbation: Secondary | ICD-10-CM | POA: Insufficient documentation

## 2016-04-02 DIAGNOSIS — R079 Chest pain, unspecified: Secondary | ICD-10-CM | POA: Diagnosis not present

## 2016-04-02 DIAGNOSIS — Z9981 Dependence on supplemental oxygen: Secondary | ICD-10-CM | POA: Diagnosis not present

## 2016-04-02 DIAGNOSIS — R52 Pain, unspecified: Secondary | ICD-10-CM

## 2016-04-02 DIAGNOSIS — E785 Hyperlipidemia, unspecified: Secondary | ICD-10-CM | POA: Insufficient documentation

## 2016-04-02 LAB — CBC WITH DIFFERENTIAL/PLATELET
BASOS ABS: 0 10*3/uL (ref 0.0–0.1)
Basophils Relative: 0 %
EOS ABS: 0 10*3/uL (ref 0.0–0.7)
EOS PCT: 0 %
HCT: 42 % (ref 39.0–52.0)
HEMOGLOBIN: 12.8 g/dL — AB (ref 13.0–17.0)
LYMPHS ABS: 1.3 10*3/uL (ref 0.7–4.0)
LYMPHS PCT: 11 %
MCH: 27.4 pg (ref 26.0–34.0)
MCHC: 30.5 g/dL (ref 30.0–36.0)
MCV: 89.9 fL (ref 78.0–100.0)
Monocytes Absolute: 1.2 10*3/uL — ABNORMAL HIGH (ref 0.1–1.0)
Monocytes Relative: 10 %
NEUTROS PCT: 79 %
Neutro Abs: 9.8 10*3/uL — ABNORMAL HIGH (ref 1.7–7.7)
PLATELETS: 228 10*3/uL (ref 150–400)
RBC: 4.67 MIL/uL (ref 4.22–5.81)
RDW: 14.6 % (ref 11.5–15.5)
WBC: 12.4 10*3/uL — AB (ref 4.0–10.5)

## 2016-04-02 LAB — BASIC METABOLIC PANEL
ANION GAP: 9 (ref 5–15)
BUN: 19 mg/dL (ref 6–20)
CHLORIDE: 97 mmol/L — AB (ref 101–111)
CO2: 32 mmol/L (ref 22–32)
Calcium: 9.1 mg/dL (ref 8.9–10.3)
Creatinine, Ser: 0.8 mg/dL (ref 0.61–1.24)
GFR calc Af Amer: >60 mL/min (ref >60)
Glucose, Bld: 129 mg/dL — ABNORMAL HIGH (ref 65–99)
POTASSIUM: 4.7 mmol/L (ref 3.5–5.1)
SODIUM: 138 mmol/L (ref 135–145)

## 2016-04-02 MED ORDER — MORPHINE SULFATE (PF) 4 MG/ML IV SOLN
4.0000 mg | Freq: Once | INTRAVENOUS | Status: AC
  Administered 2016-04-02: 4 mg via INTRAVENOUS
  Filled 2016-04-02: qty 1

## 2016-04-02 MED ORDER — MORPHINE SULFATE (PF) 4 MG/ML IV SOLN
4.0000 mg | INTRAVENOUS | Status: DC | PRN
  Administered 2016-04-02: 4 mg via INTRAVENOUS
  Filled 2016-04-02: qty 1

## 2016-04-02 MED ORDER — ONDANSETRON HCL 4 MG/2ML IJ SOLN
4.0000 mg | Freq: Once | INTRAMUSCULAR | Status: AC
  Administered 2016-04-02: 4 mg via INTRAVENOUS
  Filled 2016-04-02: qty 2

## 2016-04-02 MED ORDER — OXYCODONE-ACETAMINOPHEN 5-325 MG PO TABS
2.0000 | ORAL_TABLET | Freq: Once | ORAL | Status: AC
  Administered 2016-04-02: 2 via ORAL
  Filled 2016-04-02: qty 2

## 2016-04-02 MED ORDER — ALPRAZOLAM 0.25 MG PO TABS
0.5000 mg | ORAL_TABLET | Freq: Once | ORAL | Status: AC
  Administered 2016-04-02: 0.5 mg via ORAL
  Filled 2016-04-02 (×2): qty 2

## 2016-04-02 MED ORDER — OXYCODONE HCL ER 15 MG PO T12A: 15.0000 mg | EXTENDED_RELEASE_TABLET | Freq: Two times a day (BID) | ORAL | Status: DC

## 2016-04-02 MED ORDER — ALBUTEROL (5 MG/ML) CONTINUOUS INHALATION SOLN
10.0000 mg/h | INHALATION_SOLUTION | Freq: Once | RESPIRATORY_TRACT | Status: AC
  Administered 2016-04-02: 10 mg/h via RESPIRATORY_TRACT
  Filled 2016-04-02: qty 20

## 2016-04-02 MED ORDER — HYDROCODONE-ACETAMINOPHEN 5-325 MG PO TABS: 2.0000 | ORAL_TABLET | ORAL | Status: DC | PRN

## 2016-04-02 NOTE — ED Notes (Signed)
Pt and family are upset and states, "Why won't yall help him". Family and pt once again informed this is a chronic condition and he has a pain management clinic he sees for pain to f/u with and once again informed to f/u with PCP. Dr. Jeneen Rinks informed.

## 2016-04-02 NOTE — ED Provider Notes (Signed)
CSN: [id number]     Arrival date & time 04/02/16  1237 History   First MD Initiated Contact with Patient 04/02/16 1238     Chief Complaint  Patient presents with  . Shortness of Breath  . Rib Injury     HPI  She presents for evaluation of "pain". History of end-stage COPD on home O2. Has chronic pain. Followed by pain management at [facility]. His been seen now 3 times in the last week for left-sided chest pain. Has well localized sharp reproducible chest pain the left anterior ribs. He states to paramedics, and myself that he was told that he had "broken ribs". Recent CT angiogram and x-rays have shown a thoracic compression fracture that is perhaps slightly more compressed than in the past but likely chronic. States she's had "spells of pain" today and presents here.  Past Medical History  Diagnosis Date  . GERD (gastroesophageal reflux disease)   . COPD (chronic obstructive pulmonary disease) ([place])     severe by PFTs August 2010  . 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 Atrial fibrillation (HCC)     post-op a-fib. 3/11. post Aortobifem , /  Atrial fibrillation from nebulizer treatment  May, 2012, while hospitalized  . Renal artery stenosis ([place])      presumably corrected at time of aortobifem surgery March, 2011  . Tobacco user      stop smoking  . Pneumothorax      2011  before aortobifemoral surgery,  resolved  . Peripheral vascular disease, unspecified ([place])     bilateral intermittent claudication  . CAD (coronary artery disease), native coronary artery     catheterization 06/2009.Marland KitchenMarland Kitchen70% proximal LAD and 30% proximal RCA. normal LV function, no intervention planned prior to surgery for aorta. LV normal function  by catheter 8/10.   [name] Dyslipidemia   . DJD (degenerative joint disease)   . Ejection fraction     60%, echo, May, 2012, rapid atrial fib at the time  . Cancer [facility]) 2013    Prostate  Radiation  . Myocardial infarction ([place]) 2011  . On home O2     2L N/C  . Chronic thoracic back pain   . Chronic pain syndrome    Past Surgical History  Procedure Laterality Date  . Abdominal aortic aneurysm repair  01/11/2010  . Pr vein bypass graft,aorto-fem-pop    . Inguinal hernia repair      RIGHT   Family History  Problem Relation Age of Onset  . Cancer Father     prostate  . Heart attack Father    Social History  Substance Use Topics  . Smoking status: Former Smoker -- 2.00 packs/day for 40 years    Types: Cigarettes    Quit date: 01/11/2010  . Smokeless tobacco: Never Used  . Alcohol Use: No    Review of Systems  Constitutional: Negative for fever, chills, diaphoresis, appetite change and fatigue.  HENT: Negative for mouth sores, sore throat and trouble swallowing.   Eyes: Negative for visual disturbance.  Respiratory: Positive for cough and shortness of breath. Negative for chest tightness and wheezing.   Cardiovascular: Positive for chest pain.  Gastrointestinal: Negative for nausea, vomiting, abdominal pain, diarrhea and abdominal distention.  Endocrine: Negative for polydipsia, polyphagia and polyuria.  Genitourinary: Negative for dysuria, frequency and hematuria.  Musculoskeletal: Negative for gait problem.  Skin: Negative for  color change, pallor and rash.  Neurological: Negative for dizziness, syncope, light-headedness and headaches.  Hematological: Does not bruise/bleed easily.  Psychiatric/Behavioral: Negative for behavioral problems and confusion.      Allergies  Levaquin  Home Medications   Prior to Admission medications   Medication Sig Start Date End Date Taking? Authorizing Provider  acetaminophen (TYLENOL) 500 MG tablet Take 1,000 mg by mouth every 6 (six) hours as needed for mild pain.   Yes Historical Provider, MD  albuterol (PROVENTIL HFA;VENTOLIN HFA) 108 (90 BASE) MCG/ACT inhaler Inhale 2 puffs into the lungs every 4 (four) hours as  needed for wheezing or shortness of breath. 09/19/15  Yes Donne [name], MD  albuterol (PROVENTIL) (2.5 MG/3ML) 0.083% nebulizer solution Take 3 mLs (2.5 mg total) by nebulization every 4 (four) hours as needed for wheezing or shortness of breath. 03/15/15  Yes [name], MD  ALPRAZolam [name]) 0.25 MG tablet Take 0.25 mg by mouth at bedtime as needed for sleep.  12/17/15  Yes Historical Provider, MD  ELIQUIS 5 MG TABS tablet TAKE 1 TABLET BY MOUTH TWICE A DAY 02/11/16  Yes Satira Sark, MD  gabapentin (NEURONTIN) 300 MG capsule Take two capsules by mouth twice daily 10/30/15  Yes Historical Provider, MD  ipratropium (ATROVENT) 0.02 % nebulizer solution Take 0.5 mg by nebulization every 4 (four) hours as needed for wheezing or shortness of breath.   Yes Historical Provider, MD  methocarbamol (ROBAXIN) 500 MG tablet Take 2 tablets (1,000 mg total) by mouth 4 (four) times daily as needed for muscle spasms (muscle spasm/pain). 03/27/16  Yes Francine Graven, DO  metoprolol (LOPRESSOR) 50 MG tablet Take 50 mg by mouth 2 (two) times daily. 12/27/13  Yes Historical Provider, MD  nitroGLYCERIN (NITROSTAT) 0.4 MG SL tablet Place 1 tablet (0.4 mg total) under the tongue every 5 (five) minutes x 3 doses as needed for chest pain. 06/11/15  Yes [name], MD  omeprazole (PRILOSEC) 20 MG capsule Take 1 capsule by mouth every morning.  04/21/11  Yes Historical Provider, MD  predniSONE (DELTASONE) 20 MG tablet Take 2 tablets (40 mg total) by mouth daily. Patient taking differently: Take 60 mg by mouth 2 (two) times daily.  03/27/16  Yes Francine Graven, DO  simvastatin (ZOCOR) 40 MG tablet TAKE 1 TABLET (40 MG TOTAL) BY MOUTH EVERY EVENING. 08/29/15  Yes [name], MD  digoxin (LANOXIN) 0.125 MG tablet Take 1 tablet (0.125 mg total) by mouth daily. Patient not taking: Reported on 03/27/2016 01/10/16   [name], MD  doxycycline (VIBRA-TABS) 100 MG tablet Take 1 tablet (100 mg total) by mouth 2  (two) times daily. Patient not taking: Reported on 04/02/2016 03/27/16   Francine Graven, DO  HYDROcodone-acetaminophen (NORCO/VICODIN) 5-325 MG tablet Take 2 tablets by mouth every 4 (four) hours as needed. 04/02/16   Tanna Furry, MD  levalbuterol [facility] HFA) 45 MCG/ACT inhaler Inhale 2 puffs into the lungs every 4 (four) hours as needed for wheezing. Patient not taking: Reported on 04/02/2016 03/20/16   [name], DO  oxyCODONE (OXYCONTIN) 15 mg 12 hr tablet Take 1 tablet (15 mg total) by mouth every 12 (twelve) hours. 04/02/16   Tanna Furry, MD   BP 116/66 mmHg  Pulse 118  Temp(Src) 97.4 F (36.3 C) (Oral)  Resp 14  Ht 5\' 4"  (1.626 m)  Wt 145 lb (65.772 kg)  BMI 24.88 kg/m2  SpO2 99% Physical Exam  Constitutional: He is oriented to person, place, and time.  He appears well-developed and well-nourished. No distress.  HENT:  Head: Normocephalic.  Eyes: Conjunctivae are normal. Pupils are equal, round, and reactive to light. No scleral icterus.  Neck: Normal range of motion. Neck supple. No thyromegaly present.  Cardiovascular: Normal rate and regular rhythm.  Exam reveals no gallop and no friction rub.   No murmur heard. Pulmonary/Chest: Effort normal and breath sounds normal. No respiratory distress. He has no wheezes. He has no rales.    He has well localized and reproducible left anterior chest pain. He has wheezing on all fields.  Abdominal: Soft. Bowel sounds are normal. He exhibits no distension. There is no tenderness. There is no rebound.  Musculoskeletal: Normal range of motion.  Neurological: He is alert and oriented to person, place, and time.  Skin: Skin is warm and dry. No rash noted.  Psychiatric: He has a normal mood and affect. His behavior is normal.    ED Course  Procedures (including critical care time) Labs Review Labs Reviewed  CBC WITH DIFFERENTIAL/PLATELET - Abnormal; Notable for the following:    WBC 12.4 (*)    Hemoglobin 12.8 (*)    Neutro Abs 9.8  (*)    Monocytes Absolute 1.2 (*)    All other components within normal limits  BASIC METABOLIC PANEL - Abnormal; Notable for the following:    Chloride 97 (*)    Glucose, Bld 129 (*)    All other components within normal limits    Imaging Review Dg Chest Port 1 View  04/02/2016  CLINICAL DATA:  Shortness of breath, emphysema, COPD EXAM: PORTABLE CHEST 1 VIEW COMPARISON:  03/31/2016 FINDINGS: The heart size and mediastinal contours are within normal limits. Both lungs are clear. The visualized skeletal structures are unremarkable. IMPRESSION: No active disease. Electronically Signed   By: Kathreen Devoid   On: 04/02/2016 13:14   I have personally reviewed and evaluated these images and lab results as part of my medical decision-making.   EKG Interpretation None      MDM   Final diagnoses:  Pain    Patient with no apparent processes. He continuously and repeatedly tells me that his breathing is at his baseline. His main focus is on his pain. Both right side which is chronic and for which she follows with pain management at [facility], and left-sided which is been more recent but again becoming somewhat chronic. He's had x-rays today. CT angiogram within the last week which was negative for any acute process. He is well oxygenated on his home O2. His lungs show mildly  Wheezing and has a frequent cough but it is sputum. His chest x-ray is normal. His heart rate is 99. Written him for 2 days of his OxyContin and hydrocodone. Told him that this may very well violate his pain contract with his physician at [facility] and ask him to call before filling these prescriptions.    Tanna Furry, MD 04/02/16 1622

## 2016-04-02 NOTE — ED Notes (Signed)
Pt is in stable condition upon d/c and is escorted from ED via wheelchair. 

## 2016-04-02 NOTE — ED Notes (Signed)
PT reports he rolled over in bed 4 days ago and instantly experienced severe left rib pain. PT has been seen every day since then in [place]. PT reports no known injury. PT was diagnosed with a compound fracture. PT is experiencing SOB and severe left side/rib/chest pain. PT had 3 albuterol treatments in route, one atrovent, and 125 mg of solu medrol. PT wears 3L of O2 at home. EMS reports they trialed him on room air and he dropped to 84% on room air.

## 2016-04-02 NOTE — Discharge Instructions (Signed)
Continue your current outpatient pain medicine regimen.  You have been given prescriptions for pain medicine here.   Notify your current pain management physician prior to filling your prescriptions, as it may violate your current pain contract.

## 2016-04-06 ENCOUNTER — Encounter (HOSPITAL_COMMUNITY): Payer: Self-pay | Admitting: Emergency Medicine

## 2016-04-06 ENCOUNTER — Inpatient Hospital Stay (HOSPITAL_COMMUNITY)
Admission: EM | Admit: 2016-04-06 | Discharge: 2016-04-15 | DRG: 871 | Disposition: A | Discharge status: Home Health | Payer: Medicare Other | Attending: Internal Medicine | Admitting: Internal Medicine

## 2016-04-06 ENCOUNTER — Emergency Department (HOSPITAL_COMMUNITY): Payer: Medicare Other

## 2016-04-06 DIAGNOSIS — E876 Hypokalemia: Secondary | ICD-10-CM | POA: Diagnosis present

## 2016-04-06 DIAGNOSIS — I482 Chronic atrial fibrillation: Secondary | ICD-10-CM | POA: Diagnosis present

## 2016-04-06 DIAGNOSIS — R609 Edema, unspecified: Secondary | ICD-10-CM | POA: Diagnosis not present

## 2016-04-06 DIAGNOSIS — Z881 Allergy status to other antibiotic agents status: Secondary | ICD-10-CM | POA: Diagnosis not present

## 2016-04-06 DIAGNOSIS — Z7984 Long term (current) use of oral hypoglycemic drugs: Secondary | ICD-10-CM

## 2016-04-06 DIAGNOSIS — F40240 Claustrophobia: Secondary | ICD-10-CM | POA: Diagnosis present

## 2016-04-06 DIAGNOSIS — J441 Chronic obstructive pulmonary disease with (acute) exacerbation: Secondary | ICD-10-CM | POA: Diagnosis present

## 2016-04-06 DIAGNOSIS — Z9981 Dependence on supplemental oxygen: Secondary | ICD-10-CM | POA: Diagnosis not present

## 2016-04-06 DIAGNOSIS — I110 Hypertensive heart disease with heart failure: Secondary | ICD-10-CM | POA: Diagnosis present

## 2016-04-06 DIAGNOSIS — T380X5A Adverse effect of glucocorticoids and synthetic analogues, initial encounter: Secondary | ICD-10-CM | POA: Diagnosis present

## 2016-04-06 DIAGNOSIS — R531 Weakness: Secondary | ICD-10-CM | POA: Diagnosis present

## 2016-04-06 DIAGNOSIS — E872 Acidosis: Secondary | ICD-10-CM | POA: Diagnosis present

## 2016-04-06 DIAGNOSIS — Z79899 Other long term (current) drug therapy: Secondary | ICD-10-CM | POA: Diagnosis not present

## 2016-04-06 DIAGNOSIS — J449 Chronic obstructive pulmonary disease, unspecified: Secondary | ICD-10-CM | POA: Diagnosis present

## 2016-04-06 DIAGNOSIS — I2510 Atherosclerotic heart disease of native coronary artery without angina pectoris: Secondary | ICD-10-CM | POA: Diagnosis present

## 2016-04-06 DIAGNOSIS — I5033 Acute on chronic diastolic (congestive) heart failure: Secondary | ICD-10-CM | POA: Diagnosis present

## 2016-04-06 DIAGNOSIS — Z8249 Family history of ischemic heart disease and other diseases of the circulatory system: Secondary | ICD-10-CM | POA: Diagnosis not present

## 2016-04-06 DIAGNOSIS — E860 Dehydration: Secondary | ICD-10-CM | POA: Diagnosis present

## 2016-04-06 DIAGNOSIS — J440 Chronic obstructive pulmonary disease with acute lower respiratory infection: Secondary | ICD-10-CM | POA: Diagnosis present

## 2016-04-06 DIAGNOSIS — R0902 Hypoxemia: Secondary | ICD-10-CM

## 2016-04-06 DIAGNOSIS — I252 Old myocardial infarction: Secondary | ICD-10-CM

## 2016-04-06 DIAGNOSIS — K5900 Constipation, unspecified: Secondary | ICD-10-CM

## 2016-04-06 DIAGNOSIS — R109 Unspecified abdominal pain: Secondary | ICD-10-CM

## 2016-04-06 DIAGNOSIS — R778 Other specified abnormalities of plasma proteins: Secondary | ICD-10-CM

## 2016-04-06 DIAGNOSIS — R7989 Other specified abnormal findings of blood chemistry: Secondary | ICD-10-CM | POA: Diagnosis present

## 2016-04-06 DIAGNOSIS — Z7952 Long term (current) use of systemic steroids: Secondary | ICD-10-CM | POA: Diagnosis not present

## 2016-04-06 DIAGNOSIS — J438 Other emphysema: Secondary | ICD-10-CM | POA: Diagnosis not present

## 2016-04-06 DIAGNOSIS — I739 Peripheral vascular disease, unspecified: Secondary | ICD-10-CM | POA: Diagnosis present

## 2016-04-06 DIAGNOSIS — A419 Sepsis, unspecified organism: Secondary | ICD-10-CM | POA: Diagnosis present

## 2016-04-06 DIAGNOSIS — Z7901 Long term (current) use of anticoagulants: Secondary | ICD-10-CM | POA: Diagnosis not present

## 2016-04-06 DIAGNOSIS — Z79891 Long term (current) use of opiate analgesic: Secondary | ICD-10-CM | POA: Diagnosis not present

## 2016-04-06 DIAGNOSIS — K5792 Diverticulitis of intestine, part unspecified, without perforation or abscess without bleeding: Secondary | ICD-10-CM | POA: Diagnosis present

## 2016-04-06 DIAGNOSIS — I248 Other forms of acute ischemic heart disease: Secondary | ICD-10-CM | POA: Diagnosis present

## 2016-04-06 DIAGNOSIS — I4891 Unspecified atrial fibrillation: Secondary | ICD-10-CM

## 2016-04-06 DIAGNOSIS — R140 Abdominal distension (gaseous): Secondary | ICD-10-CM

## 2016-04-06 DIAGNOSIS — K559 Vascular disorder of intestine, unspecified: Secondary | ICD-10-CM | POA: Diagnosis present

## 2016-04-06 DIAGNOSIS — K567 Ileus, unspecified: Secondary | ICD-10-CM | POA: Diagnosis present

## 2016-04-06 DIAGNOSIS — J189 Pneumonia, unspecified organism: Secondary | ICD-10-CM | POA: Diagnosis present

## 2016-04-06 DIAGNOSIS — I480 Paroxysmal atrial fibrillation: Secondary | ICD-10-CM | POA: Diagnosis present

## 2016-04-06 DIAGNOSIS — R296 Repeated falls: Secondary | ICD-10-CM | POA: Diagnosis present

## 2016-04-06 DIAGNOSIS — G894 Chronic pain syndrome: Secondary | ICD-10-CM | POA: Diagnosis present

## 2016-04-06 DIAGNOSIS — Z8679 Personal history of other diseases of the circulatory system: Secondary | ICD-10-CM | POA: Diagnosis not present

## 2016-04-06 DIAGNOSIS — Z8042 Family history of malignant neoplasm of prostate: Secondary | ICD-10-CM

## 2016-04-06 DIAGNOSIS — I25110 Atherosclerotic heart disease of native coronary artery with unstable angina pectoris: Secondary | ICD-10-CM | POA: Diagnosis not present

## 2016-04-06 DIAGNOSIS — E119 Type 2 diabetes mellitus without complications: Secondary | ICD-10-CM | POA: Diagnosis present

## 2016-04-06 DIAGNOSIS — Z95828 Presence of other vascular implants and grafts: Secondary | ICD-10-CM

## 2016-04-06 DIAGNOSIS — Z87891 Personal history of nicotine dependence: Secondary | ICD-10-CM | POA: Diagnosis not present

## 2016-04-06 DIAGNOSIS — J9621 Acute and chronic respiratory failure with hypoxia: Secondary | ICD-10-CM | POA: Diagnosis present

## 2016-04-06 DIAGNOSIS — N390 Urinary tract infection, site not specified: Secondary | ICD-10-CM | POA: Diagnosis present

## 2016-04-06 DIAGNOSIS — K219 Gastro-esophageal reflux disease without esophagitis: Secondary | ICD-10-CM | POA: Diagnosis present

## 2016-04-06 DIAGNOSIS — R0600 Dyspnea, unspecified: Secondary | ICD-10-CM

## 2016-04-06 DIAGNOSIS — E785 Hyperlipidemia, unspecified: Secondary | ICD-10-CM | POA: Diagnosis present

## 2016-04-06 DIAGNOSIS — R0602 Shortness of breath: Secondary | ICD-10-CM

## 2016-04-06 DIAGNOSIS — Y95 Nosocomial condition: Secondary | ICD-10-CM | POA: Diagnosis present

## 2016-04-06 DIAGNOSIS — R1013 Epigastric pain: Secondary | ICD-10-CM

## 2016-04-06 LAB — URINALYSIS, ROUTINE W REFLEX MICROSCOPIC
GLUCOSE, UA: 250 mg/dL — AB
Ketones, ur: 40 mg/dL — AB
LEUKOCYTES UA: NEGATIVE
NITRITE: NEGATIVE
PROTEIN: NEGATIVE mg/dL
Specific Gravity, Urine: 1.021 (ref 1.005–1.030)
pH: 5 (ref 5.0–8.0)

## 2016-04-06 LAB — URINE MICROSCOPIC-ADD ON: SQUAMOUS EPITHELIAL / LPF: NONE SEEN

## 2016-04-06 LAB — CBC WITH DIFFERENTIAL/PLATELET
BASOS ABS: 0 10*3/uL (ref 0.0–0.1)
BASOS PCT: 0 %
EOS PCT: 0 %
Eosinophils Absolute: 0 10*3/uL (ref 0.0–0.7)
HEMATOCRIT: 39.8 % (ref 39.0–52.0)
Hemoglobin: 12.3 g/dL — ABNORMAL LOW (ref 13.0–17.0)
LYMPHS PCT: 2 %
Lymphs Abs: 0.4 10*3/uL — ABNORMAL LOW (ref 0.7–4.0)
MCH: 28.1 pg (ref 26.0–34.0)
MCHC: 30.9 g/dL (ref 30.0–36.0)
MCV: 90.9 fL (ref 78.0–100.0)
Monocytes Absolute: 1.3 10*3/uL — ABNORMAL HIGH (ref 0.1–1.0)
Monocytes Relative: 6 %
NEUTROS ABS: 19.5 10*3/uL — AB (ref 1.7–7.7)
Neutrophils Relative %: 92 %
PLATELETS: 198 10*3/uL (ref 150–400)
RBC: 4.38 MIL/uL (ref 4.22–5.81)
RDW: 14.8 % (ref 11.5–15.5)
WBC: 21.1 10*3/uL — AB (ref 4.0–10.5)

## 2016-04-06 LAB — BLOOD GAS, ARTERIAL
ACID-BASE EXCESS: 0.2 mmol/L (ref 0.0–2.0)
Bicarbonate: 25.2 mEq/L — ABNORMAL HIGH (ref 20.0–24.0)
FIO2: 1
O2 SAT: 99.5 %
PATIENT TEMPERATURE: 98.6
PCO2 ART: 45 mmHg (ref 35.0–45.0)
TCO2: 22.8 mmol/L (ref 0–100)
pH, Arterial: 7.367 (ref 7.350–7.450)
pO2, Arterial: 319 mmHg — ABNORMAL HIGH (ref 80.0–100.0)

## 2016-04-06 LAB — APTT: APTT: 32 s (ref 24–37)

## 2016-04-06 LAB — TSH: TSH: 0.523 u[IU]/mL (ref 0.350–4.500)

## 2016-04-06 LAB — COMPREHENSIVE METABOLIC PANEL
ALT: 39 U/L (ref 17–63)
ANION GAP: 15 (ref 5–15)
AST: 77 U/L — ABNORMAL HIGH (ref 15–41)
Albumin: 3.4 g/dL — ABNORMAL LOW (ref 3.5–5.0)
Alkaline Phosphatase: 53 U/L (ref 38–126)
BILIRUBIN TOTAL: 2.2 mg/dL — AB (ref 0.3–1.2)
BUN: 26 mg/dL — ABNORMAL HIGH (ref 6–20)
CO2: 26 mmol/L (ref 22–32)
Calcium: 8.6 mg/dL — ABNORMAL LOW (ref 8.9–10.3)
Chloride: 95 mmol/L — ABNORMAL LOW (ref 101–111)
Creatinine, Ser: 1.14 mg/dL (ref 0.61–1.24)
Glucose, Bld: 160 mg/dL — ABNORMAL HIGH (ref 65–99)
POTASSIUM: 3.9 mmol/L (ref 3.5–5.1)
Sodium: 136 mmol/L (ref 135–145)
TOTAL PROTEIN: 6.5 g/dL (ref 6.5–8.1)

## 2016-04-06 LAB — PROTIME-INR
INR: 1.39 (ref 0.00–1.49)
PROTHROMBIN TIME: 17.1 s — AB (ref 11.6–15.2)

## 2016-04-06 LAB — I-STAT TROPONIN, ED: TROPONIN I, POC: 0.2 ng/mL — AB (ref 0.00–0.08)

## 2016-04-06 LAB — TROPONIN I: Troponin I: 0.18 ng/mL — ABNORMAL HIGH (ref <0.031)

## 2016-04-06 LAB — CBG MONITORING, ED: Glucose-Capillary: 178 mg/dL — ABNORMAL HIGH (ref 65–99)

## 2016-04-06 LAB — MRSA PCR SCREENING: MRSA by PCR: NEGATIVE

## 2016-04-06 LAB — AMMONIA: AMMONIA: 12 umol/L (ref 9–35)

## 2016-04-06 LAB — BRAIN NATRIURETIC PEPTIDE: B Natriuretic Peptide: 140 pg/mL — ABNORMAL HIGH (ref 0.0–100.0)

## 2016-04-06 LAB — I-STAT CG4 LACTIC ACID, ED: LACTIC ACID, VENOUS: 4.55 mmol/L — AB (ref 0.5–2.0)

## 2016-04-06 LAB — PROCALCITONIN: Procalcitonin: 2.91 ng/mL

## 2016-04-06 LAB — LACTIC ACID, PLASMA: Lactic Acid, Venous: 3.8 mmol/L (ref 0.5–2.0)

## 2016-04-06 MED ORDER — ONDANSETRON HCL 4 MG/2ML IJ SOLN: 4.0000 mg | Freq: Four times a day (QID) | INTRAMUSCULAR | Status: DC | PRN

## 2016-04-06 MED ORDER — SODIUM CHLORIDE 0.9 % IV BOLUS (SEPSIS)
1000.0000 mL | Freq: Once | INTRAVENOUS | Status: AC
  Administered 2016-04-06: 1000 mL via INTRAVENOUS

## 2016-04-06 MED ORDER — MORPHINE SULFATE (PF) 2 MG/ML IV SOLN
1.0000 mg | INTRAVENOUS | Status: DC | PRN
  Administered 2016-04-06 – 2016-04-13 (×6): 1 mg via INTRAVENOUS
  Filled 2016-04-06 (×7): qty 1

## 2016-04-06 MED ORDER — IPRATROPIUM BROMIDE 0.02 % IN SOLN
0.5000 mg | Freq: Four times a day (QID) | RESPIRATORY_TRACT | Status: DC
  Administered 2016-04-06 – 2016-04-14 (×31): 0.5 mg via RESPIRATORY_TRACT
  Filled 2016-04-06 (×31): qty 2.5

## 2016-04-06 MED ORDER — LEVALBUTEROL HCL 0.63 MG/3ML IN NEBU
0.6300 mg | INHALATION_SOLUTION | Freq: Four times a day (QID) | RESPIRATORY_TRACT | Status: DC | PRN
  Filled 2016-04-06: qty 3

## 2016-04-06 MED ORDER — ALBUTEROL SULFATE (2.5 MG/3ML) 0.083% IN NEBU
5.0000 mg | INHALATION_SOLUTION | RESPIRATORY_TRACT | Status: DC
  Administered 2016-04-06 (×2): 5 mg via RESPIRATORY_TRACT
  Filled 2016-04-06: qty 6

## 2016-04-06 MED ORDER — LEVALBUTEROL HCL 0.63 MG/3ML IN NEBU
0.6300 mg | INHALATION_SOLUTION | Freq: Four times a day (QID) | RESPIRATORY_TRACT | Status: DC
  Administered 2016-04-06 – 2016-04-14 (×31): 0.63 mg via RESPIRATORY_TRACT
  Filled 2016-04-06 (×30): qty 3

## 2016-04-06 MED ORDER — ACETAMINOPHEN 325 MG PO TABS: 650.0000 mg | ORAL_TABLET | Freq: Four times a day (QID) | ORAL | Status: DC | PRN

## 2016-04-06 MED ORDER — PANTOPRAZOLE SODIUM 40 MG PO TBEC
40.0000 mg | DELAYED_RELEASE_TABLET | Freq: Every day | ORAL | Status: DC
  Administered 2016-04-07 – 2016-04-15 (×9): 40 mg via ORAL
  Filled 2016-04-06 (×9): qty 1

## 2016-04-06 MED ORDER — ALPRAZOLAM 0.25 MG PO TABS
0.2500 mg | ORAL_TABLET | Freq: Once | ORAL | Status: AC
  Administered 2016-04-06: 0.25 mg via ORAL
  Filled 2016-04-06: qty 1

## 2016-04-06 MED ORDER — ALPRAZOLAM 0.25 MG PO TABS
0.2500 mg | ORAL_TABLET | Freq: Every evening | ORAL | Status: DC | PRN
Start: 2016-04-06 — End: 2016-04-11
  Administered 2016-04-08 – 2016-04-10 (×3): 0.25 mg via ORAL
  Filled 2016-04-06 (×3): qty 1

## 2016-04-06 MED ORDER — METOPROLOL TARTRATE 50 MG PO TABS
50.0000 mg | ORAL_TABLET | Freq: Two times a day (BID) | ORAL | Status: DC
  Administered 2016-04-06 – 2016-04-15 (×17): 50 mg via ORAL
  Filled 2016-04-06 (×2): qty 2
  Filled 2016-04-06: qty 1
  Filled 2016-04-06 (×4): qty 2
  Filled 2016-04-06: qty 1
  Filled 2016-04-06: qty 2
  Filled 2016-04-06: qty 1
  Filled 2016-04-06: qty 2
  Filled 2016-04-06: qty 1
  Filled 2016-04-06 (×3): qty 2
  Filled 2016-04-06 (×2): qty 1

## 2016-04-06 MED ORDER — ACETAMINOPHEN 650 MG RE SUPP: 650.0000 mg | Freq: Four times a day (QID) | RECTAL | Status: DC | PRN

## 2016-04-06 MED ORDER — VANCOMYCIN HCL IN DEXTROSE 750-5 MG/150ML-% IV SOLN
750.0000 mg | Freq: Two times a day (BID) | INTRAVENOUS | Status: DC
  Administered 2016-04-07 – 2016-04-09 (×5): 750 mg via INTRAVENOUS
  Filled 2016-04-06 (×6): qty 150

## 2016-04-06 MED ORDER — PIPERACILLIN-TAZOBACTAM 3.375 G IVPB
3.3750 g | Freq: Three times a day (TID) | INTRAVENOUS | Status: DC
Start: 2016-04-07 — End: 2016-04-14
  Administered 2016-04-07 – 2016-04-14 (×22): 3.375 g via INTRAVENOUS
  Filled 2016-04-06 (×24): qty 50

## 2016-04-06 MED ORDER — DILTIAZEM HCL 25 MG/5ML IV SOLN
10.0000 mg | Freq: Once | INTRAVENOUS | Status: DC
  Filled 2016-04-06: qty 5

## 2016-04-06 MED ORDER — NITROGLYCERIN 0.4 MG SL SUBL
0.4000 mg | SUBLINGUAL_TABLET | SUBLINGUAL | Status: DC | PRN
Start: 2016-04-06 — End: 2016-04-15

## 2016-04-06 MED ORDER — OXYCODONE HCL ER 15 MG PO T12A
15.0000 mg | EXTENDED_RELEASE_TABLET | Freq: Two times a day (BID) | ORAL | Status: DC
  Administered 2016-04-06 – 2016-04-15 (×17): 15 mg via ORAL
  Filled 2016-04-06 (×17): qty 1

## 2016-04-06 MED ORDER — METHOCARBAMOL 500 MG PO TABS: 1000.0000 mg | ORAL_TABLET | Freq: Four times a day (QID) | ORAL | Status: DC | PRN

## 2016-04-06 MED ORDER — SODIUM CHLORIDE 0.9 % IV SOLN
INTRAVENOUS | Status: DC
  Administered 2016-04-06: 23:00:00 via INTRAVENOUS

## 2016-04-06 MED ORDER — ONDANSETRON HCL 4 MG PO TABS
4.0000 mg | ORAL_TABLET | Freq: Four times a day (QID) | ORAL | Status: DC | PRN
  Administered 2016-04-15: 4 mg via ORAL
  Filled 2016-04-06: qty 1

## 2016-04-06 MED ORDER — METHYLPREDNISOLONE SODIUM SUCC 125 MG IJ SOLR
125.0000 mg | Freq: Once | INTRAMUSCULAR | Status: AC
Start: 2016-04-06 — End: 2016-04-06
  Administered 2016-04-06: 125 mg via INTRAVENOUS
  Filled 2016-04-06: qty 2

## 2016-04-06 MED ORDER — INSULIN ASPART 100 UNIT/ML ~~LOC~~ SOLN
0.0000 [IU] | Freq: Three times a day (TID) | SUBCUTANEOUS | Status: DC
  Administered 2016-04-07: 1 [IU] via SUBCUTANEOUS
  Administered 2016-04-07 (×2): 2 [IU] via SUBCUTANEOUS
  Administered 2016-04-08: 5 [IU] via SUBCUTANEOUS
  Administered 2016-04-08: 1 [IU] via SUBCUTANEOUS
  Administered 2016-04-09: 3 [IU] via SUBCUTANEOUS
  Administered 2016-04-09: 2 [IU] via SUBCUTANEOUS
  Administered 2016-04-09: 1 [IU] via SUBCUTANEOUS
  Administered 2016-04-10 – 2016-04-11 (×5): 2 [IU] via SUBCUTANEOUS
  Administered 2016-04-11: 1 [IU] via SUBCUTANEOUS
  Administered 2016-04-12 – 2016-04-13 (×4): 2 [IU] via SUBCUTANEOUS
  Administered 2016-04-13: 1 [IU] via SUBCUTANEOUS
  Administered 2016-04-14: 2 [IU] via SUBCUTANEOUS
  Administered 2016-04-14: 1 [IU] via SUBCUTANEOUS

## 2016-04-06 MED ORDER — SIMVASTATIN 40 MG PO TABS
40.0000 mg | ORAL_TABLET | Freq: Every day | ORAL | Status: DC
  Administered 2016-04-08 – 2016-04-14 (×7): 40 mg via ORAL
  Filled 2016-04-06 (×7): qty 1

## 2016-04-06 MED ORDER — METHYLPREDNISOLONE SODIUM SUCC 40 MG IJ SOLR
40.0000 mg | Freq: Every day | INTRAMUSCULAR | Status: DC
  Administered 2016-04-06 – 2016-04-10 (×5): 40 mg via INTRAVENOUS
  Filled 2016-04-06 (×5): qty 1

## 2016-04-06 MED ORDER — GABAPENTIN 300 MG PO CAPS
600.0000 mg | ORAL_CAPSULE | Freq: Two times a day (BID) | ORAL | Status: DC
  Administered 2016-04-06 – 2016-04-07 (×2): 600 mg via ORAL
  Filled 2016-04-06 (×2): qty 2

## 2016-04-06 MED ORDER — VANCOMYCIN HCL IN DEXTROSE 1-5 GM/200ML-% IV SOLN
1000.0000 mg | Freq: Once | INTRAVENOUS | Status: AC
  Administered 2016-04-06: 1000 mg via INTRAVENOUS
  Filled 2016-04-06: qty 200

## 2016-04-06 MED ORDER — HEPARIN (PORCINE) IN NACL 100-0.45 UNIT/ML-% IJ SOLN
1150.0000 [IU]/h | INTRAMUSCULAR | Status: DC
  Administered 2016-04-06: 1150 [IU]/h via INTRAVENOUS
  Filled 2016-04-06 (×2): qty 250

## 2016-04-06 MED ORDER — PIPERACILLIN-TAZOBACTAM 3.375 G IVPB 30 MIN
3.3750 g | Freq: Once | INTRAVENOUS | Status: AC
  Administered 2016-04-06: 3.375 g via INTRAVENOUS
  Filled 2016-04-06: qty 50

## 2016-04-06 MED ORDER — IPRATROPIUM BROMIDE 0.02 % IN SOLN
0.5000 mg | Freq: Once | RESPIRATORY_TRACT | Status: AC
  Administered 2016-04-06: 0.5 mg via RESPIRATORY_TRACT
  Filled 2016-04-06: qty 2.5

## 2016-04-06 NOTE — ED Provider Notes (Addendum)
CSN: [id number]     Arrival date & time 04/06/16  1805 History   First MD Initiated Contact with Patient 04/06/16 1827     Chief Complaint  Patient presents with  . Fall   HPI Patient presents to the emergency room for evaluation of lethargy following frequent falls over this past week. Patient has a history of COPD and chronic pain issues. He has been having intermittent episodes of falling over this past week. Patient has come to the emergency room a few times to be evaluated for sharp pains in his anterior ribs. He has had x-rays as well as CT angiogram that demonstrated chronic compression fractures but no evidence of rib fractures. Family states that today the patient has been very listless and lethargic. He has not gotten out of bed. He has had a harsh junky cough.  He has been short of breath. They deny any trouble with fevers. No vomiting.  Patient states he feels weak all over. He is able to answer my questions. Denies any headache Past Medical History  Diagnosis Date  . GERD (gastroesophageal reflux disease)   . COPD (chronic obstructive pulmonary disease) ([place])     severe by PFTs August 2010  . 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 Atrial fibrillation (HCC)     post-op a-fib. 3/11. post Aortobifem , /  Atrial fibrillation from nebulizer treatment  May, 2012, while hospitalized  . Renal artery stenosis ([place])      presumably corrected at time of aortobifem surgery March, 2011  . Tobacco user      stop smoking  . Pneumothorax      2011  before aortobifemoral surgery,  resolved  . Peripheral vascular disease, unspecified ([place])     bilateral intermittent claudication  . CAD (coronary artery disease), native coronary artery     catheterization 06/2009.Marland KitchenMarland Kitchen70% proximal LAD and 30% proximal RCA. normal LV function, no intervention planned prior to surgery for aorta. LV normal function  by catheter  8/10.   [name] Dyslipidemia   . DJD (degenerative joint disease)   . Ejection fraction     60%, echo, May, 2012, rapid atrial fib at the time  . Cancer [facility]) 2013    Prostate Radiation  . Myocardial infarction ([place]) 2011  . On home O2     2L N/C  . Chronic thoracic back pain   . Chronic pain syndrome    Past Surgical History  Procedure Laterality Date  . Abdominal aortic aneurysm repair  01/11/2010  . Pr vein bypass graft,aorto-fem-pop    . Inguinal hernia repair      RIGHT   Family History  Problem Relation Age of Onset  . Cancer Father     prostate  . Heart attack Father    Social History  Substance Use Topics  . Smoking status: Former Smoker -- 2.00 packs/day for 40 years    Types: Cigarettes    Quit date: 01/11/2010  . Smokeless tobacco: Never Used  . Alcohol Use: No    Review of Systems  All other systems reviewed and are negative.     Allergies  Levaquin  Home Medications   Prior to Admission medications   Medication Sig Start Date End Date Taking? Authorizing Provider  acetaminophen (TYLENOL) 500 MG tablet Take 1,000 mg by mouth every 6 (six) hours as needed for mild pain.  Yes Historical Provider, MD  albuterol (PROVENTIL HFA;VENTOLIN HFA) 108 (90 BASE) MCG/ACT inhaler Inhale 2 puffs into the lungs every 4 (four) hours as needed for wheezing or shortness of breath. 09/19/15  Yes Donne [name], MD  albuterol (PROVENTIL) (2.5 MG/3ML) 0.083% nebulizer solution Take 3 mLs (2.5 mg total) by nebulization every 4 (four) hours as needed for wheezing or shortness of breath. 03/15/15  Yes [name], MD  ALPRAZolam [name]) 0.25 MG tablet Take 0.25 mg by mouth at bedtime as needed for sleep.  12/17/15  Yes Historical Provider, MD  ELIQUIS 5 MG TABS tablet TAKE 1 TABLET BY MOUTH TWICE A DAY 02/11/16  Yes Satira Sark, MD  gabapentin (NEURONTIN) 300 MG capsule Take two capsules by mouth twice daily 10/30/15  Yes Historical Provider, MD  HYDROcodone-acetaminophen  (NORCO/VICODIN) 5-325 MG tablet Take 2 tablets by mouth every 4 (four) hours as needed. Patient taking differently: Take 2 tablets by mouth every 4 (four) hours as needed for moderate pain or severe pain.  04/02/16  Yes Tanna Furry, MD  ipratropium (ATROVENT) 0.02 % nebulizer solution Take 0.5 mg by nebulization every 4 (four) hours as needed for wheezing or shortness of breath.   Yes Historical Provider, MD  metFORMIN (GLUCOPHAGE) 1000 MG tablet Take 1,000 mg by mouth daily with breakfast.   Yes Historical Provider, MD  methocarbamol (ROBAXIN) 500 MG tablet Take 2 tablets (1,000 mg total) by mouth 4 (four) times daily as needed for muscle spasms (muscle spasm/pain). 03/27/16  Yes Francine Graven, DO  metoprolol (LOPRESSOR) 50 MG tablet Take 50 mg by mouth 2 (two) times daily. 12/27/13  Yes Historical Provider, MD  nitroGLYCERIN (NITROSTAT) 0.4 MG SL tablet Place 1 tablet (0.4 mg total) under the tongue every 5 (five) minutes x 3 doses as needed for chest pain. 06/11/15  Yes [name], MD  omeprazole (PRILOSEC) 20 MG capsule Take 20 mg by mouth every morning.  04/21/11  Yes Historical Provider, MD  oxyCODONE (OXYCONTIN) 15 mg 12 hr tablet Take 1 tablet (15 mg total) by mouth every 12 (twelve) hours. 04/02/16  Yes Tanna Furry, MD  simvastatin (ZOCOR) 40 MG tablet TAKE 1 TABLET (40 MG TOTAL) BY MOUTH EVERY EVENING. 08/29/15  Yes [name], MD  digoxin (LANOXIN) 0.125 MG tablet Take 1 tablet (0.125 mg total) by mouth daily. Patient not taking: Reported on 03/27/2016 01/10/16   [name], MD  doxycycline (VIBRA-TABS) 100 MG tablet Take 1 tablet (100 mg total) by mouth 2 (two) times daily. Patient not taking: Reported on 04/02/2016 03/27/16   Francine Graven, DO  levalbuterol [facility] HFA) 45 MCG/ACT inhaler Inhale 2 puffs into the lungs every 4 (four) hours as needed for wheezing. Patient not taking: Reported on 04/02/2016 03/20/16   [name], DO  predniSONE (DELTASONE) 20 MG tablet Take 2  tablets (40 mg total) by mouth daily. Patient not taking: Reported on 04/06/2016 03/27/16   Francine Graven, DO   BP 136/68 mmHg  Pulse 132  Temp(Src) 97.6 F (36.4 C) (Oral)  Resp 14  SpO2 100% Physical Exam  Constitutional: No distress.  Appears chronically ill  HENT:  Head: Normocephalic and atraumatic.  Right Ear: External ear normal.  Left Ear: External ear normal.  Mouth/Throat: No oropharyngeal exudate.  Mucous membranes are dry  Eyes: Conjunctivae are normal. Right eye exhibits no discharge. Left eye exhibits no discharge. No scleral icterus.  Neck: Neck supple. No tracheal deviation present.  Cardiovascular: Normal rate, regular rhythm and  intact distal pulses.   Pulmonary/Chest: Effort normal. No stridor. Tachypnea noted. No respiratory distress. He has wheezes. He has rhonchi. He has rales.  Abdominal: Soft. Bowel sounds are normal. He exhibits no distension. There is no tenderness. There is no rebound and no guarding.  Musculoskeletal: He exhibits edema. He exhibits no tenderness.  Neurological: He is alert. He has normal strength. No cranial nerve deficit (no facial droop, extraocular movements intact, no slurred speech) or sensory deficit. He exhibits normal muscle tone. He displays no seizure activity. Coordination normal.  Skin: Skin is warm and dry. No rash noted.  Thin skin, ecchymoses in the upper extremities  Psychiatric: He has a normal mood and affect.  Nursing note and vitals reviewed.   ED Course  Procedures  CRITICAL CARE Performed by: [id number] Total critical care time: 35 minutes Critical care time was exclusive of separately billable procedures and treating other patients. Critical care was necessary to treat or prevent imminent or life-threatening deterioration. Critical care was time spent personally by me on the following activities: development of treatment plan with patient and/or surrogate as well as nursing, discussions with consultants,  evaluation of patient's response to treatment, examination of patient, obtaining history from patient or surrogate, ordering and performing treatments and interventions, ordering and review of laboratory studies, ordering and review of radiographic studies, pulse oximetry and re-evaluation of patient's condition.  Medications  sodium chloride 0.9 % bolus 1,000 mL (1,000 mLs Intravenous New Bag/Given 04/06/16 1938)    And  sodium chloride 0.9 % bolus 1,000 mL (not administered)  albuterol (PROVENTIL) (2.5 MG/3ML) 0.083% nebulizer solution 5 mg (5 mg Nebulization Given 04/06/16 2018)  vancomycin (VANCOCIN) IVPB 1000 mg/200 mL premix (1,000 mg Intravenous New Bag/Given 04/06/16 2009)  ipratropium (ATROVENT) nebulizer solution 0.5 mg (0.5 mg Nebulization Given 04/06/16 1929)  piperacillin-tazobactam (ZOSYN) IVPB 3.375 g (0 g Intravenous Stopped 04/06/16 2003)    8:44 PM Sepsis - Repeat Assessment  Performed at:    8:44 PM   Vitals     Blood pressure 127/60, pulse 135, temperature 97.6 F (36.4 C), temperature source Oral, resp. rate 23, SpO2 95 %.  Heart:     Tachycardic  Lungs:    Wheezing and Rhonchi  Capillary Refill:   <2 sec  Peripheral Pulse:   Radial pulse palpable  Skin:     Pale   Labs Review Labs Reviewed  COMPREHENSIVE METABOLIC PANEL - Abnormal; Notable for the following:    Chloride 95 (*)    Glucose, Bld 160 (*)    BUN 26 (*)    Calcium 8.6 (*)    Albumin 3.4 (*)    AST 77 (*)    Total Bilirubin 2.2 (*)    All other components within normal limits  CBC WITH DIFFERENTIAL/PLATELET - Abnormal; Notable for the following:    WBC 21.1 (*)    Hemoglobin 12.3 (*)    Neutro Abs 19.5 (*)    Lymphs Abs 0.4 (*)    Monocytes Absolute 1.3 (*)    All other components within normal limits  URINALYSIS, ROUTINE W REFLEX MICROSCOPIC (NOT AT [facility]) - Abnormal; Notable for the following:    Color, Urine AMBER (*)    APPearance CLOUDY (*)    Glucose, UA 250 (*)    Hgb urine  dipstick MODERATE (*)    Bilirubin Urine MODERATE (*)    Ketones, ur 40 (*)    All other components within normal limits  BLOOD GAS, ARTERIAL - Abnormal; Notable for the  following:    pO2, Arterial 319 (*)    Bicarbonate 25.2 (*)    All other components within normal limits  BRAIN NATRIURETIC PEPTIDE - Abnormal; Notable for the following:    B Natriuretic Peptide 140.0 (*)    All other components within normal limits  URINE MICROSCOPIC-ADD ON - Abnormal; Notable for the following:    Bacteria, UA RARE (*)    Casts HYALINE CASTS (*)    All other components within normal limits  CBG MONITORING, ED - Abnormal; Notable for the following:    Glucose-Capillary 178 (*)    All other components within normal limits  I-STAT CG4 LACTIC ACID, ED - Abnormal; Notable for the following:    Lactic Acid, Venous 4.55 (*)    All other components within normal limits  I-STAT TROPOININ, ED - Abnormal; Notable for the following:    Troponin i, poc 0.20 (*)    All other components within normal limits  CULTURE, BLOOD (ROUTINE X 2)  CULTURE, BLOOD (ROUTINE X 2)  URINE CULTURE    Imaging Review Dg Chest Portable 1 View  04/06/2016  CLINICAL DATA:  Status post fall, with worsening shortness of breath. Syncope and lower extremity weakness. Initial encounter. EXAM: PORTABLE CHEST 1 VIEW COMPARISON:  Chest radiograph performed 04/02/2016 FINDINGS: The lungs are well-aerated. Vascular congestion is noted. Mild left basilar airspace opacity could reflect pneumonia. Mild right midlung and right apical scarring is noted. There is no evidence of pleural effusion or pneumothorax. The cardiomediastinal silhouette is within normal limits. No acute osseous abnormalities are seen. IMPRESSION: Vascular congestion noted. Mild left basilar opacity could reflect pneumonia. Mild right midlung and right apical scarring noted. Electronically Signed   By: Garald Balding M.D.   On: 04/06/2016 19:00   I have personally reviewed and  evaluated these images and lab results as part of my medical decision-making.   EKG Interpretation   Date/Time:  Sunday Apr 06 2016 20:09:32 EDT Ventricular Rate:  132 PR Interval:  115 QRS Duration: 80 QT Interval:  330 QTC Calculation: 489 R Axis:   24 Text Interpretation:  Probable Sinus tachycardia Probable left atrial  enlargement Nonspecific repol abnormality, lateral leads Poor data quality  in current ECG precludes serial comparison Confirmed by Benjimin Hadden  MD-J, Sharvil Hoey  [id number]) on 04/06/2016 8:29:08 PM      MDM   Final diagnoses:  HCAP (healthcare-associated pneumonia)  Sepsis, due to unspecified organism [facility])  UTI (lower urinary tract infection)  Elevated troponin   Patient presents to the emergency room with complaints of increasing weakness.  Patient's had significant respiratory congestion and is tachycardic. Laboratory tests suggest possible urinary tract infection. Lactic acid and WBC are elevated suggesting sepsis. Patient's troponin also was slightly elevated suggesting demand ischemia.  Poor data quality but likely sinus tachy without ischemia.  ABG without acidosis or hypercarbia  Empiric abx started although most likely uti and possibly pneumonia.  BP is decreasing with IV fluids.    Will admit for further treatment.   Dorie Rank, MD 04/06/16 2045  Rechecked at 2103.  Still with very ronchorous breath sounds.  Oxygen saturation is 100%.  CXR and BNP earlier not suggesting pulmonary edema.  Will need to monitor with the fluid bolus he was given.  Will try bipap for resp support however pt is very claustrophobic and will only try with a dose of xanax.  Will continue to monitor while awaiting admission.  Dorie Rank, MD 04/06/16 2105

## 2016-04-06 NOTE — Progress Notes (Signed)
ANTICOAGULATION CONSULT NOTE - Initial Consult  Pharmacy Consult for Heparin Indication: afib--heparin bridge apixaban prior to admission  Allergies  Allergen Reactions  . Levaquin [Levofloxacin In D5w] Other (See Comments)    Pt states it caused dry mouth and rectal bleeding.     Patient Measurements: Height: 5\' 4"  (162.6 cm) Weight: 146 lb 6.2 oz (66.4 kg) IBW/kg (Calculated) : 59.2 Heparin Dosing Weight:   Vital Signs: Temp: 98.1 F (36.7 C) (05/28 2200) Temp Source: Oral (05/28 2200) BP: 140/98 mmHg (05/28 2200) Pulse Rate: 135 (05/28 2212)  Labs:  Recent Labs  04/06/16 1900  HGB 12.3*  HCT 39.8  PLT 198  CREATININE 1.14    Estimated Creatinine Clearance: 54.8 mL/min (by C-G formula based on Cr of 1.14).   Medical History: Past Medical History  Diagnosis Date  . GERD (gastroesophageal reflux disease)   . COPD (chronic obstructive pulmonary disease) ([place])     severe by PFTs August 2010  . 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 Atrial fibrillation (HCC)     post-op a-fib. 3/11. post Aortobifem , /  Atrial fibrillation from nebulizer treatment  May, 2012, while hospitalized  . Renal artery stenosis ([place])      presumably corrected at time of aortobifem surgery March, 2011  . Tobacco user      stop smoking  . Pneumothorax      2011  before aortobifemoral surgery,  resolved  . Peripheral vascular disease, unspecified ([place])     bilateral intermittent claudication  . CAD (coronary artery disease), native coronary artery     catheterization 06/2009.Marland KitchenMarland Kitchen70% proximal LAD and 30% proximal RCA. normal LV function, no intervention planned prior to surgery for aorta. LV normal function  by catheter 8/10.   [name] Dyslipidemia   . DJD (degenerative joint disease)   . Ejection fraction     60%, echo, May, 2012, rapid atrial fib at the time  . Cancer [facility]) 2013    Prostate Radiation  .  Myocardial infarction ([place]) 2011  . On home O2     2L N/C  . Chronic thoracic back pain   . Chronic pain syndrome     Medications:  Infusions:  . sodium chloride    . heparin      Assessment: Patient on apixaban prior to admission for afib.  Last dose noted 5/27 at 2000.  Baseline PTT ordered, but not resulted yet.  Heparin to start by pharmacy, per MD consult.  PTT ordered with Heparin level until both correlate due to possible drug-lab interaction between oral anticoagulant (rivaroxaban, edoxaban, or apixaban) and anti-Xa level (aka heparin level)   Goal of Therapy:  Heparin level 0.3-0.7 units/ml aPTT 66-102 seconds Monitor platelets by anticoagulation protocol: Yes   Plan:  No heparin bolus due to prior apixaban Heparin drip at 1150 units/hr Daily CBC Heparin level and PTT at 0800 5/29  Tyler Deis, Shea Stakes [place] 04/06/2016,10:31 PM

## 2016-04-06 NOTE — ED Notes (Signed)
Family at bedside with pt. And awaiting disposition by MD.

## 2016-04-06 NOTE — ED Notes (Signed)
EDP made aware of patient troponin result.

## 2016-04-06 NOTE — Progress Notes (Signed)
Pharmacy Antibiotic Follow-up Note  Kenneth Merritt is a 65 y.o. year-old male admitted on 04/06/2016.  The patient is currently on day 1 of Vancomycin & Zosyn  for sepsis.  Assessment/Plan: Vancomycin 1gm x1, then 750mg   IV every 12 hours.  Goal trough 15-20 mcg/mL. Zosyn 3.375g IV q8h (4 hour infusion).  Temp (24hrs), Avg:97.6 F (36.4 C), Min:97.6 F (36.4 C), Max:97.6 F (36.4 C)   Recent Labs Lab 04/02/16 1312 04/06/16 1900  WBC 12.4* 21.1*    Recent Labs Lab 04/02/16 1312 04/06/16 1900  CREATININE 0.80 1.14   Estimated Creatinine Clearance: 54.8 mL/min (by C-G formula based on Cr of 1.14).    Allergies  Allergen Reactions  . Levaquin [Levofloxacin In D5w] Other (See Comments)    Pt states it caused dry mouth and rectal bleeding.    Antimicrobials this admission: 5/28 Vanc >> 5/28 Zosyn >>  Dose adjustments this admission:  Microbiology results: 5/28 BCx: sent 5/28 UCx: sent    Thank you for allowing pharmacy to be a part of this patient's care.  Minda Ditto PharmD 04/06/2016 8:35 PM

## 2016-04-06 NOTE — ED Notes (Signed)
MD at bedside. 

## 2016-04-06 NOTE — ED Notes (Signed)
Per daughter, states recent falls-was diagnosed with fractured vertebrae-states fell again yesterday-increased SOB, dizzy, LE weakness

## 2016-04-06 NOTE — ED Notes (Signed)
Per family, states has been falling

## 2016-04-06 NOTE — H&P (Addendum)
History and Physical    Kenneth Merritt [id number] DOB: [birth year] DOA: 04/06/2016  PCP: Neale Burly, MD  Patient coming from: Home.  History obtained from patient's daughter, wife and patient in previous charts and ER physician.  Chief Complaint: Weakness lethargy and falls.  HPI: Kenneth Merritt is a 65 y.o. male with medical history significant of chronic atrial fibrillation, hyperlipidemia, peripheral vascular disease status post aorta femoral bypass was brought to the ER after patient was found to have a fall with persistent pain. Patient fell 2 days ago in the bathroom witnessed by patient's daughter. Patient states he did not hit his head but has been having left flank pain since the fall. Patient had come to the ER a week ago for back pain and was found to have a compression fracture at that time. In the ER patient was found to be tachycardic hypotensive with blood work showing elevated lactic acid and leukocytosis. Chest x-ray was showing possible infiltrates for pneumonia and on exam patient also has left flank and left lower quadrant tenderness. Patient has been admitted for sepsis with possible pneumonia as the source but patient also has some tenderness in the left flank for which I have ordered CT scan is pending. Patient is tachycardic around 120-130 but in sinus rhythm. Patient's blood pressure improved with fluid bolus. Patient also has lower extremity swelling which family states developed over the last 1 week.   ED Course: Was started on IV fluids antibiotics and blood cultures were obtained. Patient also was placed on BiPAP.  Review of Systems: As per HPI otherwise 10 point review of systems negative.    Past Medical History  Diagnosis Date  . GERD (gastroesophageal reflux disease)   . COPD (chronic obstructive pulmonary disease) ([place])     severe by PFTs August 2010  . 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 Atrial fibrillation (HCC)     post-op a-fib. 3/11. post Aortobifem , /  Atrial fibrillation from nebulizer treatment  May, 2012, while hospitalized  . Renal artery stenosis ([place])      presumably corrected at time of aortobifem surgery March, 2011  . Tobacco user      stop smoking  . Pneumothorax      2011  before aortobifemoral surgery,  resolved  . Peripheral vascular disease, unspecified ([place])     bilateral intermittent claudication  . CAD (coronary artery disease), native coronary artery     catheterization 06/2009.Marland KitchenMarland Kitchen70% proximal LAD and 30% proximal RCA. normal LV function, no intervention planned prior to surgery for aorta. LV normal function  by catheter 8/10.   [name] Dyslipidemia   . DJD (degenerative joint disease)   . Ejection fraction     60%, echo, May, 2012, rapid atrial fib at the time  . Cancer [facility]) 2013    Prostate Radiation  . Myocardial infarction ([place]) 2011  . On home O2     2L [place]  . Chronic thoracic back pain   . Chronic pain syndrome     Past Surgical History  Procedure Laterality Date  . Abdominal aortic aneurysm repair  01/11/2010  . Pr vein bypass graft,aorto-fem-pop    . Inguinal hernia repair      RIGHT     reports that he quit smoking about 6 years ago. His smoking use included Cigarettes. He has a 80 pack-year  smoking history. He has never used smokeless tobacco. He reports that he does not drink alcohol or use illicit drugs.  Allergies  Allergen Reactions  . Levaquin [Levofloxacin In D5w] Other (See Comments)    Pt states it caused dry mouth and rectal bleeding.     Family History  Problem Relation Age of Onset  . Cancer Father     prostate  . Heart attack Father     Prior to Admission medications   Medication Sig Start Date End Date Taking? Authorizing Provider  acetaminophen (TYLENOL) 500 MG tablet Take 1,000 mg by mouth every 6 (six) hours as needed for mild pain.   Yes Historical Provider, MD    albuterol (PROVENTIL HFA;VENTOLIN HFA) 108 (90 BASE) MCG/ACT inhaler Inhale 2 puffs into the lungs every 4 (four) hours as needed for wheezing or shortness of breath. 09/19/15  Yes Donne [name], MD  albuterol (PROVENTIL) (2.5 MG/3ML) 0.083% nebulizer solution Take 3 mLs (2.5 mg total) by nebulization every 4 (four) hours as needed for wheezing or shortness of breath. 03/15/15  Yes [name], MD  ALPRAZolam [name]) 0.25 MG tablet Take 0.25 mg by mouth at bedtime as needed for sleep.  12/17/15  Yes Historical Provider, MD  ELIQUIS 5 MG TABS tablet TAKE 1 TABLET BY MOUTH TWICE A DAY 02/11/16  Yes Satira Sark, MD  gabapentin (NEURONTIN) 300 MG capsule Take two capsules by mouth twice daily 10/30/15  Yes Historical Provider, MD  HYDROcodone-acetaminophen (NORCO/VICODIN) 5-325 MG tablet Take 2 tablets by mouth every 4 (four) hours as needed. Patient taking differently: Take 2 tablets by mouth every 4 (four) hours as needed for moderate pain or severe pain.  04/02/16  Yes Tanna Furry, MD  ipratropium (ATROVENT) 0.02 % nebulizer solution Take 0.5 mg by nebulization every 4 (four) hours as needed for wheezing or shortness of breath.   Yes Historical Provider, MD  metFORMIN (GLUCOPHAGE) 1000 MG tablet Take 1,000 mg by mouth daily with breakfast.   Yes Historical Provider, MD  methocarbamol (ROBAXIN) 500 MG tablet Take 2 tablets (1,000 mg total) by mouth 4 (four) times daily as needed for muscle spasms (muscle spasm/pain). 03/27/16  Yes Francine Graven, DO  metoprolol (LOPRESSOR) 50 MG tablet Take 50 mg by mouth 2 (two) times daily. 12/27/13  Yes Historical Provider, MD  nitroGLYCERIN (NITROSTAT) 0.4 MG SL tablet Place 1 tablet (0.4 mg total) under the tongue every 5 (five) minutes x 3 doses as needed for chest pain. 06/11/15  Yes [name], MD  omeprazole (PRILOSEC) 20 MG capsule Take 20 mg by mouth every morning.  04/21/11  Yes Historical Provider, MD  oxyCODONE (OXYCONTIN) 15 mg 12 hr tablet Take 1  tablet (15 mg total) by mouth every 12 (twelve) hours. 04/02/16  Yes Tanna Furry, MD  simvastatin (ZOCOR) 40 MG tablet TAKE 1 TABLET (40 MG TOTAL) BY MOUTH EVERY EVENING. 08/29/15  Yes [name], MD  digoxin (LANOXIN) 0.125 MG tablet Take 1 tablet (0.125 mg total) by mouth daily. Patient not taking: Reported on 03/27/2016 01/10/16   [name], MD  doxycycline (VIBRA-TABS) 100 MG tablet Take 1 tablet (100 mg total) by mouth 2 (two) times daily. Patient not taking: Reported on 04/02/2016 03/27/16   Francine Graven, DO  levalbuterol [facility] HFA) 45 MCG/ACT inhaler Inhale 2 puffs into the lungs every 4 (four) hours as needed for wheezing. Patient not taking: Reported on 04/02/2016 03/20/16   [name], DO  predniSONE (DELTASONE) 20 MG tablet  Take 2 tablets (40 mg total) by mouth daily. 03/27/16   Francine Graven, DO    Physical Exam: Filed Vitals:   04/06/16 2038 04/06/16 2100 04/06/16 2200 04/06/16 2212  BP: 127/60 124/73 140/98   Pulse: 135 137  135  Temp:   98.1 F (36.7 C)   TempSrc:   Oral   Resp: 23 16 28 23   Height:   5\' 4"  (1.626 m)   Weight:   146 lb 6.2 oz (66.4 kg)   SpO2: 95% 100% 100% 100%      Constitutional: Not in distress. Filed Vitals:   04/06/16 2038 04/06/16 2100 04/06/16 2200 04/06/16 2212  BP: 127/60 124/73 140/98   Pulse: 135 137  135  Temp:   98.1 F (36.7 C)   TempSrc:   Oral   Resp: 23 16 28 23   Height:   5\' 4"  (1.626 m)   Weight:   146 lb 6.2 oz (66.4 kg)   SpO2: 95% 100% 100% 100%   Eyes: Anicteric no pallor. ENMT: No discharge from the ears eyes nose and mouth. Neck: No JVD appreciated. No mass felt. Respiratory: No rhonchi or crepitations. Cardiovascular: S1-S2 heard tachycardic. Abdomen: Left flank tenderness no guarding or rigidity. Musculoskeletal: Bilateral lower extremity edema. Skin: No rash. Neurologic: Alert awake oriented to time place and person. Moves all extremities. Psychiatric: Appears normal.   Labs on  Admission: I have personally reviewed following labs and imaging studies  CBC:  Recent Labs Lab 04/02/16 1312 04/06/16 1900  WBC 12.4* 21.1*  NEUTROABS 9.8* 19.5*  HGB 12.8* 12.3*  HCT 42.0 39.8  MCV 89.9 90.9  PLT 228 [id number]   Basic Metabolic Panel:  Recent Labs Lab 04/02/16 1312 04/06/16 1900  NA 138 136  K 4.7 3.9  CL 97* 95*  CO2 32 26  GLUCOSE 129* 160*  BUN 19 26*  CREATININE 0.80 1.14  CALCIUM 9.1 8.6*   GFR: Estimated Creatinine Clearance: 54.8 mL/min (by C-G formula based on Cr of 1.14). Liver Function Tests:  Recent Labs Lab 04/06/16 1900  AST 77*  ALT 39  ALKPHOS 53  BILITOT 2.2*  PROT 6.5  ALBUMIN 3.4*   No results for input(s): LIPASE, AMYLASE in the last 168 hours. No results for input(s): AMMONIA in the last 168 hours. Coagulation Profile: No results for input(s): INR, PROTIME in the last 168 hours. Cardiac Enzymes: No results for input(s): CKTOTAL, CKMB, CKMBINDEX, TROPONINI in the last 168 hours. BNP (last 3 results) No results for input(s): PROBNP in the last 8760 hours. HbA1C: No results for input(s): HGBA1C in the last 72 hours. CBG:  Recent Labs Lab 04/06/16 1833  GLUCAP 178*   Lipid Profile: No results for input(s): CHOL, HDL, LDLCALC, TRIG, CHOLHDL, LDLDIRECT in the last 72 hours. Thyroid Function Tests: No results for input(s): TSH, T4TOTAL, FREET4, T3FREE, THYROIDAB in the last 72 hours. Anemia Panel: No results for input(s): VITAMINB12, FOLATE, FERRITIN, TIBC, IRON, RETICCTPCT in the last 72 hours. Urine analysis:    Component Value Date/Time   COLORURINE AMBER* 04/06/2016 1942   APPEARANCEUR CLOUDY* 04/06/2016 1942   LABSPEC 1.021 04/06/2016 1942   PHURINE 5.0 04/06/2016 1942   GLUCOSEU 250* 04/06/2016 1942   HGBUR MODERATE* 04/06/2016 1942   BILIRUBINUR MODERATE* 04/06/2016 1942   KETONESUR 40* 04/06/2016 1942   PROTEINUR NEGATIVE 04/06/2016 1942   UROBILINOGEN 0.2 01/21/2010 1944   NITRITE NEGATIVE 04/06/2016  1942   LEUKOCYTESUR NEGATIVE 04/06/2016 1942   Sepsis Labs: @LABRCNTIP (procalcitonin:4,lacticidven:4) )No results found for  this or any previous visit (from the past 240 hour(s)).   Radiological Exams on Admission: Dg Chest Portable 1 View  04/06/2016  CLINICAL DATA:  Status post fall, with worsening shortness of breath. Syncope and lower extremity weakness. Initial encounter. EXAM: PORTABLE CHEST 1 VIEW COMPARISON:  Chest radiograph performed 04/02/2016 FINDINGS: The lungs are well-aerated. Vascular congestion is noted. Mild left basilar airspace opacity could reflect pneumonia. Mild right midlung and right apical scarring is noted. There is no evidence of pleural effusion or pneumothorax. The cardiomediastinal silhouette is within normal limits. No acute osseous abnormalities are seen. IMPRESSION: Vascular congestion noted. Mild left basilar opacity could reflect pneumonia. Mild right midlung and right apical scarring noted. Electronically Signed   By: Garald Balding M.D.   On: 04/06/2016 19:00    EKG: Independently reviewed. Sinus tachycardia.  Assessment/Plan Principal Problem:   Sepsis ([place]) Active Problems:   COPD (chronic obstructive pulmonary disease) (HCC)   S/P aortobifemoral bypass surgery   CAD (coronary artery disease), native coronary artery   PAF (paroxysmal atrial fibrillation) ([place])   HCAP (healthcare-associated pneumonia)    #1. Sepsis - source could be pneumonia but patient also has left flank pain for which I have ordered CT abdomen and pelvis. Continue with aggressive hydration check procalcitonin levels blood cultures urine cultures and I have placed patient on empiric antibiotics for now. #2. Left flank pain - check CT abdomen and pelvis. #3. History of atrial fibrillation presently in sinus tachycardia - I have ordered 1 dose of patient's home dose of metoprolol and if still tachycardic may need Cardizem infusion. Patient is on Apixaban which I will hold off for now  anticipating procedure and will start patient on heparin infusion. Last dose of Apixaban was this evening. #4. Peripheral vascular disease status post aorta femoral bypass - has lower extremity edema. Check Dopplers. Check 2-D echo. #5. CAD - check troponins. Denies any chest pain. #6. COPD on chronic prednisone therapy - I have placed patient on IV Solu-Medrol as a stress dose continue inhalers patient is personally not wheezing.  Addendum - patient's repeat ABG did show some respiratory acidosis which I did discuss with on-call pulmonary critical care. At this time they have advised to continue BiPAP and repeat ABG. CT was showing possible ischemic colitis for which I will place patient nothing by mouth for now. I have discussed with oncoming physician Dr. Hartford Poli about patient's condition.   DVT prophylaxis: Heparin infusion. Code Status: Full code.  Family Communication: Patient's wife and daughters.  Disposition Plan: Home.  Consults called: None.  Admission status: Inpatient stepdown. Likely stay 3-4 days.    Rise Patience MD [facility] Pager [phone].  If 7PM-7AM, please contact night-coverage [email or web address] Password [facility]  04/06/2016, 10:24 PM

## 2016-04-07 ENCOUNTER — Inpatient Hospital Stay (HOSPITAL_COMMUNITY): Payer: Medicare Other

## 2016-04-07 ENCOUNTER — Encounter (HOSPITAL_COMMUNITY): Payer: Self-pay

## 2016-04-07 DIAGNOSIS — R609 Edema, unspecified: Secondary | ICD-10-CM

## 2016-04-07 DIAGNOSIS — I4891 Unspecified atrial fibrillation: Secondary | ICD-10-CM

## 2016-04-07 LAB — COMPREHENSIVE METABOLIC PANEL
ALK PHOS: 46 U/L (ref 38–126)
ALT: 38 U/L (ref 17–63)
ANION GAP: 10 (ref 5–15)
AST: 84 U/L — ABNORMAL HIGH (ref 15–41)
Albumin: 2.9 g/dL — ABNORMAL LOW (ref 3.5–5.0)
BILIRUBIN TOTAL: 1.8 mg/dL — AB (ref 0.3–1.2)
BUN: 14 mg/dL (ref 6–20)
CALCIUM: 7.6 mg/dL — AB (ref 8.9–10.3)
CO2: 26 mmol/L (ref 22–32)
Chloride: 102 mmol/L (ref 101–111)
Creatinine, Ser: 0.77 mg/dL (ref 0.61–1.24)
GFR calc non Af Amer: >60 mL/min (ref >60)
Glucose, Bld: 202 mg/dL — ABNORMAL HIGH (ref 65–99)
Potassium: 4.8 mmol/L (ref 3.5–5.1)
SODIUM: 138 mmol/L (ref 135–145)
TOTAL PROTEIN: 5.6 g/dL — AB (ref 6.5–8.1)

## 2016-04-07 LAB — CBC WITH DIFFERENTIAL/PLATELET
Basophils Absolute: 0 10*3/uL (ref 0.0–0.1)
Basophils Relative: 0 %
EOS ABS: 0 10*3/uL (ref 0.0–0.7)
EOS PCT: 0 %
HCT: 35.4 % — ABNORMAL LOW (ref 39.0–52.0)
HEMOGLOBIN: 11.3 g/dL — AB (ref 13.0–17.0)
LYMPHS ABS: 0.1 10*3/uL — AB (ref 0.7–4.0)
Lymphocytes Relative: 1 %
MCH: 28.9 pg (ref 26.0–34.0)
MCHC: 31.9 g/dL (ref 30.0–36.0)
MCV: 90.5 fL (ref 78.0–100.0)
MONOS PCT: 4 %
Monocytes Absolute: 0.7 10*3/uL (ref 0.1–1.0)
NEUTROS PCT: 95 %
Neutro Abs: 16.9 10*3/uL — ABNORMAL HIGH (ref 1.7–7.7)
Platelets: 180 10*3/uL (ref 150–400)
RBC: 3.91 MIL/uL — ABNORMAL LOW (ref 4.22–5.81)
RDW: 15.2 % (ref 11.5–15.5)
WBC: 17.8 10*3/uL — ABNORMAL HIGH (ref 4.0–10.5)

## 2016-04-07 LAB — BLOOD GAS, ARTERIAL
ACID-BASE DEFICIT: 2 mmol/L (ref 0.0–2.0)
ACID-BASE EXCESS: 1.9 mmol/L (ref 0.0–2.0)
ACID-BASE EXCESS: 7.6 mmol/L — AB (ref 0.0–2.0)
Bicarbonate: 24.8 mEq/L — ABNORMAL HIGH (ref 20.0–24.0)
Bicarbonate: 27.8 mEq/L — ABNORMAL HIGH (ref 20.0–24.0)
Bicarbonate: 33 mEq/L — ABNORMAL HIGH (ref 20.0–24.0)
DELIVERY SYSTEMS: POSITIVE
DRAWN BY: 44138
Delivery systems: POSITIVE
Drawn by: 295031
Drawn by: 422461
EXPIRATORY PAP: 5
FIO2: 0.35
FIO2: 0.35
FIO2: 0.35
INSPIRATORY PAP: 15
Mode: POSITIVE
Mode: POSITIVE
O2 SAT: 99.1 %
O2 Saturation: 98.1 %
O2 Saturation: 98.3 %
PATIENT TEMPERATURE: 98.6
PATIENT TEMPERATURE: 97
PCO2 ART: 53 mmHg — AB (ref 35.0–45.0)
PCO2 ART: 51.1 mmHg — AB (ref 35.0–45.0)
PEEP/CPAP: 5 cmH2O
PEEP/CPAP: 5 cmH2O
PH ART: 7.283 — AB (ref 7.350–7.450)
PO2 ART: 228 mmHg — AB (ref 80.0–100.0)
PO2 ART: 120 mmHg — AB (ref 80.0–100.0)
PO2 ART: 114 mmHg — AB (ref 80.0–100.0)
PRESSURE CONTROL: 10 cmH2O
PRESSURE CONTROL: 10 cmH2O
Patient temperature: 98.6
RATE: 12 resp/min
RATE: 14 resp/min
RATE: 14 resp/min
TCO2: 23.1 mmol/L (ref 0–100)
TCO2: 25.9 mmol/L (ref 0–100)
TCO2: 30.5 mmol/L (ref 0–100)
pCO2 arterial: 54.1 mmHg — ABNORMAL HIGH (ref 35.0–45.0)
pH, Arterial: 7.339 — ABNORMAL LOW (ref 7.350–7.450)
pH, Arterial: 7.421 (ref 7.350–7.450)

## 2016-04-07 LAB — LACTIC ACID, PLASMA: LACTIC ACID, VENOUS: 2.2 mmol/L — AB (ref 0.5–2.0)

## 2016-04-07 LAB — GLUCOSE, CAPILLARY
GLUCOSE-CAPILLARY: 201 mg/dL — AB (ref 65–99)
GLUCOSE-CAPILLARY: 199 mg/dL — AB (ref 65–99)
Glucose-Capillary: 143 mg/dL — ABNORMAL HIGH (ref 65–99)
Glucose-Capillary: 170 mg/dL — ABNORMAL HIGH (ref 65–99)
Glucose-Capillary: 128 mg/dL — ABNORMAL HIGH (ref 65–99)

## 2016-04-07 LAB — APTT
APTT: 86 s — AB (ref 24–37)
aPTT: 196 seconds — ABNORMAL HIGH (ref 24–37)

## 2016-04-07 LAB — TROPONIN I
Troponin I: 0.12 ng/mL — ABNORMAL HIGH (ref <0.031)
Troponin I: 0.11 ng/mL — ABNORMAL HIGH (ref <0.031)

## 2016-04-07 LAB — ECHOCARDIOGRAM COMPLETE
Height: 64 in
Weight: 2405.66 oz

## 2016-04-07 LAB — HEPARIN LEVEL (UNFRACTIONATED)
HEPARIN UNFRACTIONATED: 1.22 [IU]/mL — AB (ref 0.30–0.70)
Heparin Unfractionated: 1.8 IU/mL — ABNORMAL HIGH (ref 0.30–0.70)

## 2016-04-07 MED ORDER — ALPRAZOLAM 0.5 MG PO TABS
0.5000 mg | ORAL_TABLET | Freq: Once | ORAL | Status: AC
  Administered 2016-04-07: 0.5 mg via ORAL
  Filled 2016-04-07: qty 1

## 2016-04-07 MED ORDER — PERFLUTREN LIPID MICROSPHERE
1.0000 mL | INTRAVENOUS | Status: AC | PRN
  Administered 2016-04-07: 2 mL via INTRAVENOUS
  Filled 2016-04-07: qty 10

## 2016-04-07 MED ORDER — FUROSEMIDE 10 MG/ML IJ SOLN
40.0000 mg | Freq: Two times a day (BID) | INTRAMUSCULAR | Status: DC
  Administered 2016-04-07 – 2016-04-11 (×9): 40 mg via INTRAVENOUS
  Filled 2016-04-07 (×9): qty 4

## 2016-04-07 MED ORDER — POLYETHYLENE GLYCOL 3350 17 G PO PACK
17.0000 g | PACK | Freq: Every day | ORAL | Status: DC
  Administered 2016-04-08 – 2016-04-10 (×3): 17 g via ORAL
  Filled 2016-04-07 (×3): qty 1

## 2016-04-07 MED ORDER — LEVALBUTEROL HCL 0.63 MG/3ML IN NEBU
0.6300 mg | INHALATION_SOLUTION | RESPIRATORY_TRACT | Status: DC | PRN
  Administered 2016-04-07 – 2016-04-09 (×4): 0.63 mg via RESPIRATORY_TRACT
  Filled 2016-04-07 (×4): qty 3

## 2016-04-07 MED ORDER — HEPARIN (PORCINE) IN NACL 100-0.45 UNIT/ML-% IJ SOLN
900.0000 [IU]/h | INTRAMUSCULAR | Status: DC
Start: 2016-04-07 — End: 2016-04-08
  Filled 2016-04-07: qty 250

## 2016-04-07 MED ORDER — IOPAMIDOL (ISOVUE-300) INJECTION 61%
100.0000 mL | Freq: Once | INTRAVENOUS | Status: AC | PRN
  Administered 2016-04-07: 100 mL via INTRAVENOUS

## 2016-04-07 MED ORDER — FUROSEMIDE 10 MG/ML IJ SOLN
40.0000 mg | Freq: Once | INTRAMUSCULAR | Status: AC
  Administered 2016-04-07: 40 mg via INTRAVENOUS
  Filled 2016-04-07: qty 4

## 2016-04-07 NOTE — Progress Notes (Signed)
ANTICOAGULATION CONSULT NOTE - Follow Up Consult  Pharmacy Consult for Heparin Indication: atrial fibrillation  Allergies  Allergen Reactions  . Levaquin [Levofloxacin In D5w] Other (See Comments)    Pt states it caused dry mouth and rectal bleeding.     Patient Measurements: Height: 5\' 4"  (162.6 cm) Weight: 150 lb 5.7 oz (68.2 kg) IBW/kg (Calculated) : 59.2 Heparin Dosing Weight: actual weight  Vital Signs: Temp: 97.3 F (36.3 C) (05/29 0400) Temp Source: Axillary (05/29 0400) BP: 112/98 mmHg (05/29 0600) Pulse Rate: 109 (05/29 0600)  Labs:  Recent Labs  04/06/16 1900 04/06/16 2238 04/07/16 0444  HGB 12.3*  --  11.3*  HCT 39.8  --  35.4*  PLT 198  --  180  APTT  --  32  --   LABPROT  --  17.1*  --   INR  --  1.39  --   CREATININE 1.14  --  0.77  TROPONINI  --  0.18* 0.12*    Estimated Creatinine Clearance: 78.1 mL/min (by C-G formula based on Cr of 0.77).   Medications:  Infusions:  . sodium chloride 100 mL/hr at 04/07/16 0600  . heparin 1,150 Units/hr (04/07/16 0600)    Assessment: 26 yoM admitted on 5/28 after falling at home, persistent pain, and now with suspected sepsis d/t pneumonia.  PMH includes Apixaban for afib which was held on admission.  Pharmacy is consulted to dose Heparin in anticipation of possible invasive procedure.   Last Apixaban dose 5mg  BID, 5/27 at 2000.  Today, 04/07/2016: Heparin level 1.22, supratherapeutic likely d/t interaction with PTA apixaban. APTT 86, therapeutic within goal range CBC: Hbg 11.3 slightly decreased, Plt 180 No bleeding or complications reported.  RN notes bruising on arms.   Goal of Therapy:  INR 2-3 aPTT 66-102 seconds Monitor platelets by anticoagulation protocol: Yes   Plan:   Continue heparin IV infusion at 1150 units/hr  Heparin level in 6 hours to confirm therapeutic rate.  APTT ordered with Heparin level until both correlate due to possible drug-lab interaction between apixaban and anti-Xa  level (aka heparin level)   Daily heparin level and CBC  Follow up plans for procedures, and/or resuming oral anticoagulation.   Gretta Arab PharmD, BCPS Pager [phone] 04/07/2016 7:21 AM

## 2016-04-07 NOTE — Progress Notes (Signed)
  Echocardiogram 2D Echocardiogram has been performed.  Aggie Cosier 04/07/2016, 9:36 AM

## 2016-04-07 NOTE — Progress Notes (Signed)
PROGRESS NOTE  Kenneth Merritt  [id number] DOB: 08/22/1951 DOA: 04/06/2016 PCP: Neale Burly, MD Outpatient Specialists:  Subjective: On BiPAP seen with nursing staff and respiratory at bedside.  Brief Narrative:  65 year old male with history of COPD and PAF came into the hospital because of falls and somnolence, acute respiratory distress was placed on BiPAP and admitted to stepdown. CXR showed congestion and possible pneumonia, CT of abdomen and pelvis showed colitis, per radiology differential include diverticulitis but leans more toward ischemic colitis  Assessment & Plan:   Principal Problem:   Sepsis ([place]) Active Problems:   COPD (chronic obstructive pulmonary disease) (HCC)   S/P aortobifemoral bypass surgery   CAD (coronary artery disease), native coronary artery   PAF (paroxysmal atrial fibrillation) ([place])   HCAP (healthcare-associated pneumonia)   This is a no charge note, patient seen earlier today by likely Dr. Hal Hope. I have seen the patient, examined him and reviewed the chart. Complex situation with the pneumonia, volume overload, possible ischemic colitis in the background of atrial fibrillation. Started on Lasix, heparin drip, BiPAP and treatment for pneumonia. For the ischemic colitis try to avoid hypotension, low threshold to consult general surgery.   DVT prophylaxis:  Code Status: Full Code Family Communication:  Disposition Plan:  Diet: Diet NPO time specified Except for: Sips with Meds  Consultants:   None  Procedures:   None  Antimicrobials:   None   Objective: Filed Vitals:   04/07/16 0400 04/07/16 0500 04/07/16 0600 04/07/16 0735  BP: 125/79  112/98   Pulse: 95 110 109   Temp: 97.3 F (36.3 C)     TempSrc: Axillary     Resp: 10 8 8    Height:      Weight:  68.2 kg (150 lb 5.7 oz)    SpO2: 100% 98% 98% 98%    Intake/Output Summary (Last 24 hours) at 04/07/16 1052 Last data filed at 04/07/16 0614  Gross per 24 hour    Intake 940.57 ml  Output    900 ml  Net  40.57 ml   Filed Weights   04/06/16 2200 04/07/16 0500  Weight: 66.4 kg (146 lb 6.2 oz) 68.2 kg (150 lb 5.7 oz)    Examination: General exam: Appears calm and comfortable  Respiratory system: Clear to auscultation. Respiratory effort normal. Cardiovascular system: S1 & S2 heard, RRR. No JVD, murmurs, rubs, gallops or clicks. No pedal edema. Gastrointestinal system: Abdomen is nondistended, soft and nontender. No organomegaly or masses felt. Normal bowel sounds heard. Central nervous system: Alert and oriented. No focal neurological deficits. Extremities: Symmetric 5 x 5 power. Skin: No rashes, lesions or ulcers Psychiatry: Judgement and insight appear normal. Mood & affect appropriate.   Data Reviewed: I have personally reviewed following labs and imaging studies  CBC:  Recent Labs Lab 04/02/16 1312 04/06/16 1900 04/07/16 0444  WBC 12.4* 21.1* 17.8*  NEUTROABS 9.8* 19.5* 16.9*  HGB 12.8* 12.3* 11.3*  HCT 42.0 39.8 35.4*  MCV 89.9 90.9 90.5  PLT 228 198 [id number]   Basic Metabolic Panel:  Recent Labs Lab 04/02/16 1312 04/06/16 1900 04/07/16 0444  NA 138 136 138  K 4.7 3.9 4.8  CL 97* 95* 102  CO2 32 26 26  GLUCOSE 129* 160* 202*  BUN 19 26* 14  CREATININE 0.80 1.14 0.77  CALCIUM 9.1 8.6* 7.6*   GFR: Estimated Creatinine Clearance: 78.1 mL/min (by C-G formula based on Cr of 0.77). Liver Function Tests:  Recent Labs Lab 04/06/16 1900 04/07/16 0444  AST 77* 84*  ALT 39 38  ALKPHOS 53 46  BILITOT 2.2* 1.8*  PROT 6.5 5.6*  ALBUMIN 3.4* 2.9*   No results for input(s): LIPASE, AMYLASE in the last 168 hours.  Recent Labs Lab 04/06/16 2238  AMMONIA 12   Coagulation Profile:  Recent Labs Lab 04/06/16 2238  INR 1.39   Cardiac Enzymes:  Recent Labs Lab 04/06/16 2238 04/07/16 0444  TROPONINI 0.18* 0.12*   BNP (last 3 results) No results for input(s): PROBNP in the last 8760 hours. HbA1C: No results for  input(s): HGBA1C in the last 72 hours. CBG:  Recent Labs Lab 04/06/16 1833 04/07/16 0523 04/07/16 0733  GLUCAP 178* 201* 199*   Lipid Profile: No results for input(s): CHOL, HDL, LDLCALC, TRIG, CHOLHDL, LDLDIRECT in the last 72 hours. Thyroid Function Tests:  Recent Labs  04/06/16 2238  TSH 0.523   Anemia Panel: No results for input(s): VITAMINB12, FOLATE, FERRITIN, TIBC, IRON, RETICCTPCT in the last 72 hours. Urine analysis:    Component Value Date/Time   COLORURINE AMBER* 04/06/2016 1942   APPEARANCEUR CLOUDY* 04/06/2016 1942   LABSPEC 1.021 04/06/2016 1942   PHURINE 5.0 04/06/2016 1942   GLUCOSEU 250* 04/06/2016 1942   HGBUR MODERATE* 04/06/2016 1942   BILIRUBINUR MODERATE* 04/06/2016 1942   KETONESUR 40* 04/06/2016 1942   PROTEINUR NEGATIVE 04/06/2016 1942   UROBILINOGEN 0.2 01/21/2010 1944   NITRITE NEGATIVE 04/06/2016 1942   LEUKOCYTESUR NEGATIVE 04/06/2016 1942   Sepsis Labs: @LABRCNTIP (procalcitonin:4,lacticidven:4)  ) Recent Results (from the past 240 hour(s))  Blood Culture (routine x 2)     Status: None (Preliminary result)   Collection Time: 04/06/16  7:00 PM  Result Value Ref Range Status   Specimen Description BLOOD LEFT WRIST  Final   Special Requests IN PEDIATRIC BOTTLE 5CC  Final   Culture PENDING  Incomplete   Report Status PENDING  Incomplete  MRSA PCR Screening     Status: None   Collection Time: 04/06/16 10:21 PM  Result Value Ref Range Status   MRSA by PCR NEGATIVE NEGATIVE Final    Comment:        The GeneXpert MRSA Assay (FDA approved for NASAL specimens only), is one component of a comprehensive MRSA colonization surveillance program. It is not intended to diagnose MRSA infection nor to guide or monitor treatment for MRSA infections.      Invalid input(s): PROCALCITONIN, LACTICACIDVEN   Radiology Studies: Ct Abdomen Pelvis W Contrast  04/07/2016  CLINICAL DATA:  Left flank pain. Lethargy, frequent falls. Elevated white  blood cell count. EXAM: CT ABDOMEN AND PELVIS WITH CONTRAST TECHNIQUE: Multidetector CT imaging of the abdomen and pelvis was performed using the standard protocol following bolus administration of intravenous contrast. CONTRAST:  141mL ISOVUE-300 IOPAMIDOL (ISOVUE-300) INJECTION 61% COMPARISON:  Chest CT 03/27/2016. Report from CT abdomen/ pelvis 12/26/2012, images not available. CT 03/03/2012 FINDINGS: Lower chest: Consolidation in the lingula with air bronchograms, new from chest CT 10 days prior. Small focus of tree in bud opacities in the right lower lobe is also new. Right middle lobe scarring is unchanged. Liver: No focal lesion. Hepatobiliary: Gallbladder distention but no calcified stone or pericholecystic inflammation. Common bile duct normal in caliber. Pancreas: No ductal dilatation or inflammation. Spleen: Normal in size, no focal lesion. Adrenal glands: No nodule. Kidneys: Symmetric renal enhancement and excretion. No hydronephrosis. No perinephric stranding or focal abnormality. Stomach/Bowel: Stomach physiologically distended. There are no dilated or thickened small bowel loops. Mild soft tissue stranding in the left pericolic gutter,  there is a questionable colonic wall thickening and small diverticulum at the junction of the descending and sigmoid colon best depicted on coronal image 53 series 602. This is in the region of the pericolonic fat stranding. No definite diverticular disease elsewhere. No pneumatosis or free air. Portions of the appendix are visualized and normal. There is no right-sided inflammation. Vascular/Lymphatic: No retroperitoneal adenopathy. Post abdominal aortobifemoral bypass graft no periaortic soft tissue stranding. The graft appears patent. Aortic dimensions at the proximal aspect of the graft 2.3 cm maximal dimension. No large vessel mesenteric occlusion. The IMA is not seen, likely excluded postsurgical. Celiac and superior mesenteric arteries are patent. Reproductive:   Brachytherapy seeds in the prostate gland. Bladder: Distended.  No bladder wall thickening. Other: No free air, free fluid, or intra-abdominal fluid collection. Musculoskeletal: There are no acute or suspicious osseous abnormalities. Avascular necrosis of both femoral heads. Question subchondral collapse on the right. IMPRESSION: 1. Soft tissue stranding in the left pericolic gutter. There short segment colonic wall thickening with possible small diverticulum at the junction of the descending and sigmoid colon a region of fat stranding. Differential considerations include mild acute diverticulitis, however no significant diverticular burden is seen elsewhere. Focal colitis is considered, with distribution suggesting ischemic etiology. 2. 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 3. Lingular consolidation is new from chest CT eleven days prior. Additional right lower lobe tree in bud opacities. Findings suspicious for infectious etiology/ pneumonia and bronchiolitis. Electronically Signed   By: Jeb Levering M.D.   On: 04/07/2016 01:51   Dg Chest Port 1 View  04/07/2016  CLINICAL DATA:  Worsening shortness of breath. EXAM: PORTABLE CHEST 1 VIEW COMPARISON:  Radiograph yesterday at 1832 hours. Chest CT 03/27/2016 FINDINGS: Diminished vascular congestion from prior exam. Improved left basilar aeration with minimal residual patchy opacity. The lingular opacity on CT is not well seen. Hyperinflation and emphysema. Biapical and mid lung zone scarring again seen. Cardiomediastinal contours are unchanged. IMPRESSION: Decreasing pulmonary vascular congestion. Lingular opacity on CT is not well seen. Minimal patchy left basilar opacity, with mild improvement from prior radiograph. Electronically Signed   By: Jeb Levering M.D.   On: 04/07/2016 05:55   Dg Chest Portable 1 View  04/06/2016  CLINICAL DATA:  Status post fall, with worsening shortness of breath. Syncope and lower extremity weakness.  Initial encounter. EXAM: PORTABLE CHEST 1 VIEW COMPARISON:  Chest radiograph performed 04/02/2016 FINDINGS: The lungs are well-aerated. Vascular congestion is noted. Mild left basilar airspace opacity could reflect pneumonia. Mild right midlung and right apical scarring is noted. There is no evidence of pleural effusion or pneumothorax. The cardiomediastinal silhouette is within normal limits. No acute osseous abnormalities are seen. IMPRESSION: Vascular congestion noted. Mild left basilar opacity could reflect pneumonia. Mild right midlung and right apical scarring noted. Electronically Signed   By: Garald Balding M.D.   On: 04/06/2016 19:00        Scheduled Meds: . gabapentin  600 mg Oral BID  . insulin aspart  0-9 Units Subcutaneous TID WC  . ipratropium  0.5 mg Nebulization Q6H  . levalbuterol  0.63 mg Nebulization Q6H  . methylPREDNISolone (SOLU-MEDROL) injection  40 mg Intravenous Daily  . metoprolol  50 mg Oral BID  . oxyCODONE  15 mg Oral Q12H  . pantoprazole  40 mg Oral Daily  . piperacillin-tazobactam (ZOSYN)  IV  3.375 g Intravenous Q8H  . simvastatin  40 mg Oral q1800  . vancomycin  750 mg Intravenous Q12H  Continuous Infusions: . sodium chloride 100 mL/hr at 04/07/16 0600  . heparin 1,150 Units/hr (04/07/16 0600)     LOS: 1 day    Time spent: 35 minutes    Lorn Butcher A, MD [facility] Pager [phone]  If 7PM-7AM, please contact night-coverage [email or web address] Password [facility] 04/07/2016, 10:52 AM

## 2016-04-07 NOTE — Progress Notes (Signed)
VASCULAR LAB PRELIMINARY  PRELIMINARY  PRELIMINARY  PRELIMINARY  Bilateral lower extremity venous duplex completed.     Bilateral:  No evidence of DVT, superficial thrombosis, or Baker's Cyst.  Gave patient's nurse the results  Janifer Adie, RVT, RDMS 04/07/2016, 12:53 PM

## 2016-04-07 NOTE — Progress Notes (Signed)
Patient did well off of Bipap for aprox 4 hours then after a nap became confused. Patient placed on BiPap and family and MD notified. Will continue to monitor.

## 2016-04-07 NOTE — Progress Notes (Signed)
ANTICOAGULATION CONSULT NOTE - Follow Up Consult  Pharmacy Consult for Heparin Indication: atrial fibrillation  Allergies  Allergen Reactions  . Levaquin [Levofloxacin In D5w] Other (See Comments)    Pt states it caused dry mouth and rectal bleeding.     Patient Measurements: Height: 5\' 4"  (162.6 cm) Weight: 150 lb 5.7 oz (68.2 kg) IBW/kg (Calculated) : 59.2 Heparin Dosing Weight: actual weight  Vital Signs: Temp: 97 F (36.1 C) (05/29 1200) Temp Source: Axillary (05/29 1200) BP: 121/86 mmHg (05/29 1200) Pulse Rate: 90 (05/29 1400)  Labs:  Recent Labs  04/06/16 1900 04/06/16 2238 04/07/16 0444 04/07/16 0808 04/07/16 0817 04/07/16 1015 04/07/16 1452  HGB 12.3*  --  11.3*  --   --   --   --   HCT 39.8  --  35.4*  --   --   --   --   PLT 198  --  180  --   --   --   --   APTT  --  32  --   --  86*  --  196*  LABPROT  --  17.1*  --   --   --   --   --   INR  --  1.39  --   --   --   --   --   HEPARINUNFRC  --   --   --  1.22*  --   --  1.80*  CREATININE 1.14  --  0.77  --   --   --   --   TROPONINI  --  0.18* 0.12*  --   --  0.11*  --     Estimated Creatinine Clearance: 78.1 mL/min (by C-G formula based on Cr of 0.77).   Medications:  Infusions:  . heparin 1,150 Units/hr (04/07/16 0600)    Assessment: 2 yoM admitted on 5/28 after falling at home, persistent pain, and now with suspected sepsis d/t pneumonia.  PMH includes Apixaban for afib which was held on admission.  Pharmacy is consulted to dose Heparin in anticipation of possible invasive procedure.   Last Apixaban dose 5mg  BID, 5/27 at 2000.  Today, 04/07/2016: Heparin level 1.80, supratherapeutic likely d/t interaction with PTA apixaban. APTT 196, supratherapeutic CBC: Hbg 11.3 slightly decreased, Plt 180 RN noted pt had bloody BM   Goal of Therapy:  INR 2-3 aPTT 66-102 seconds Monitor platelets by anticoagulation protocol: Yes   Plan:   Hold heparin for 1 hour  Then resume heparin drip at  900 units/hr  Heparin level in 6 hours   APTT ordered with Heparin level until both correlate due to possible drug-lab interaction between apixaban and anti-Xa level (aka heparin level)   Daily heparin level and CBC  Follow up plans for procedures, and/or resuming oral anticoagulation.   Dolly Rias RPh 04/07/2016, 4:36 PM Pager [phone]

## 2016-04-07 NOTE — Progress Notes (Signed)
Patient had Bloody BM MD immidiately notified and MD prescribed Miralax. RN to continue observation

## 2016-04-07 NOTE — Care Management Note (Signed)
Case Management Note  Patient Details  Name: KJ COENEN MRN: [id number] Date of Birth: 07/31/1951  Subjective/Objective:     resp distress and on bipap               Action/Plan:Date:  Apr 07, 2016 Chart reviewed for concurrent status and case management needs. Will continue to follow patient for changes and needs: Expected discharge date: [id number] Velva Harman, BSN, [place], [place]   Expected Discharge Date:                  Expected Discharge Plan:  Home/Self Care  In-House Referral:  NA  Discharge planning Services  CM Consult  Post Acute Care Choice:  NA Choice offered to:  NA  DME Arranged:  N/A DME Agency:  NA  HH Arranged:  NA HH Agency:  NA  Status of Service:  In process, will continue to follow  Medicare Important Message Given:    Date Medicare IM Given:    Medicare IM give by:    Date Additional Medicare IM Given:    Additional Medicare Important Message give by:     If discussed at [place] of Stay Meetings, dates discussed:    Additional Comments:  Leeroy Cha, RN 04/07/2016, 10:34 AM

## 2016-04-08 DIAGNOSIS — R778 Other specified abnormalities of plasma proteins: Secondary | ICD-10-CM | POA: Diagnosis present

## 2016-04-08 DIAGNOSIS — J438 Other emphysema: Secondary | ICD-10-CM

## 2016-04-08 DIAGNOSIS — R7989 Other specified abnormal findings of blood chemistry: Secondary | ICD-10-CM

## 2016-04-08 LAB — CBC
HCT: 33.1 % — ABNORMAL LOW (ref 39.0–52.0)
Hemoglobin: 10.4 g/dL — ABNORMAL LOW (ref 13.0–17.0)
MCH: 28.5 pg (ref 26.0–34.0)
MCHC: 31.4 g/dL (ref 30.0–36.0)
MCV: 90.7 fL (ref 78.0–100.0)
PLATELETS: 184 10*3/uL (ref 150–400)
RBC: 3.65 MIL/uL — AB (ref 4.22–5.81)
RDW: 14.8 % (ref 11.5–15.5)
WBC: 18 10*3/uL — ABNORMAL HIGH (ref 4.0–10.5)

## 2016-04-08 LAB — BASIC METABOLIC PANEL
Anion gap: 11 (ref 5–15)
BUN: 17 mg/dL (ref 6–20)
CALCIUM: 7.8 mg/dL — AB (ref 8.9–10.3)
CO2: 31 mmol/L (ref 22–32)
CREATININE: 0.66 mg/dL (ref 0.61–1.24)
Chloride: 99 mmol/L — ABNORMAL LOW (ref 101–111)
GFR calc Af Amer: >60 mL/min (ref >60)
Glucose, Bld: 135 mg/dL — ABNORMAL HIGH (ref 65–99)
POTASSIUM: 4 mmol/L (ref 3.5–5.1)
SODIUM: 141 mmol/L (ref 135–145)

## 2016-04-08 LAB — GLUCOSE, CAPILLARY
Glucose-Capillary: 98 mg/dL (ref 65–99)
Glucose-Capillary: 143 mg/dL — ABNORMAL HIGH (ref 65–99)
Glucose-Capillary: 264 mg/dL — ABNORMAL HIGH (ref 65–99)
Glucose-Capillary: 130 mg/dL — ABNORMAL HIGH (ref 65–99)

## 2016-04-08 LAB — URINE CULTURE: Culture: NO GROWTH

## 2016-04-08 LAB — APTT
aPTT: >200 seconds (ref 24–37)
aPTT: 108 seconds — ABNORMAL HIGH (ref 24–37)

## 2016-04-08 LAB — LACTIC ACID, PLASMA: LACTIC ACID, VENOUS: 1.6 mmol/L (ref 0.5–2.0)

## 2016-04-08 LAB — HEPARIN LEVEL (UNFRACTIONATED)
HEPARIN UNFRACTIONATED: 1.72 [IU]/mL — AB (ref 0.30–0.70)
Heparin Unfractionated: 1.28 IU/mL — ABNORMAL HIGH (ref 0.30–0.70)

## 2016-04-08 MED ORDER — HEPARIN (PORCINE) IN NACL 100-0.45 UNIT/ML-% IJ SOLN: 800.0000 [IU]/h | INTRAMUSCULAR | Status: DC

## 2016-04-08 MED ORDER — APIXABAN 5 MG PO TABS
5.0000 mg | ORAL_TABLET | Freq: Two times a day (BID) | ORAL | Status: DC
  Administered 2016-04-08 – 2016-04-15 (×15): 5 mg via ORAL
  Filled 2016-04-08 (×15): qty 1

## 2016-04-08 MED ORDER — DILTIAZEM HCL 100 MG IV SOLR
INTRAVENOUS | Status: AC
  Filled 2016-04-08: qty 100

## 2016-04-08 MED ORDER — DILTIAZEM HCL 100 MG IV SOLR
5.0000 mg/h | INTRAVENOUS | Status: DC
  Administered 2016-04-08: 6 mg/h via INTRAVENOUS
  Administered 2016-04-08: 7 mg/h via INTRAVENOUS
  Administered 2016-04-08: 5 mg/h via INTRAVENOUS
  Administered 2016-04-09: 15 mg/h via INTRAVENOUS
  Filled 2016-04-08 (×5): qty 100

## 2016-04-08 MED ORDER — DIGOXIN 0.25 MG/ML IJ SOLN
0.2500 mg | Freq: Once | INTRAMUSCULAR | Status: AC
  Administered 2016-04-08: 0.25 mg via INTRAVENOUS
  Filled 2016-04-08: qty 1

## 2016-04-08 NOTE — Progress Notes (Signed)
MD and pharmacy notified of new onset pink tinged urine

## 2016-04-08 NOTE — Progress Notes (Signed)
Made K Schorr NP aware of the Pt's HR in the 170's , currently maxed out IV Cardizem, new order's received  Will continue to monitor. 98/70, 156, 98, 13.

## 2016-04-08 NOTE — Progress Notes (Signed)
Procedures CRITICAL VALUE ALERT  Critical value received: PTT >200  Date of notification:  04/07/16  Time of notification:  0110  Critical value read back: yes  Nurse who received alert:  Manson Allan RN  MD notified (1st page):  Pharmacy Managing  Time of first page:  0115  MD notified (2nd page):N/A  Time of second page:N/A  Responding MD:  N/A  Time MD responded:  N/A

## 2016-04-08 NOTE — Progress Notes (Signed)
ANTICOAGULATION CONSULT NOTE - Follow Up Consult  Pharmacy Consult for Heparin Indication: atrial fibrillation  Allergies  Allergen Reactions  . Levaquin [Levofloxacin In D5w] Other (See Comments)    Pt states it caused dry mouth and rectal bleeding.     Patient Measurements: Height: 5\' 4"  (162.6 cm) Weight: 150 lb 5.7 oz (68.2 kg) IBW/kg (Calculated) : 59.2 Heparin Dosing Weight:   Vital Signs: Temp: 98.1 F (36.7 C) (05/29 2312) Temp Source: Oral (05/29 2312) BP: 100/62 mmHg (05/30 0400) Pulse Rate: 84 (05/30 0400)  Labs:  Recent Labs  04/06/16 1900  04/06/16 2238 04/07/16 0001 04/07/16 0444 04/07/16 0808 04/07/16 0817 04/07/16 1015 04/07/16 1452  HGB 12.3*  --   --   --  11.3*  --   --   --   --   HCT 39.8  --   --   --  35.4*  --   --   --   --   PLT 198  --   --   --  180  --   --   --   --   APTT  --   < > 32 >200*  --   --  86*  --  196*  LABPROT  --   --  17.1*  --   --   --   --   --   --   INR  --   --  1.39  --   --   --   --   --   --   HEPARINUNFRC  --   --   --  1.72*  --  1.22*  --   --  1.80*  CREATININE 1.14  --   --   --  0.77  --   --   --   --   TROPONINI  --   --  0.18*  --  0.12*  --   --  0.11*  --   < > = values in this interval not displayed.  Estimated Creatinine Clearance: 78.1 mL/min (by C-G formula based on Cr of 0.77).   Medications:  Infusions:  . heparin 800 Units/hr (04/08/16 0300)    Assessment: Patient with high heparin level and PTT.  No heparin issues per RN.  PTT ordered with Heparin level until both correlate due to possible drug-lab interaction between oral anticoagulant (rivaroxaban, edoxaban, or apixaban) and anti-Xa level (aka heparin level).  Goal of Therapy:  Heparin level 0.3-0.7 units/ml aPTT 66-102 seconds Monitor platelets by anticoagulation protocol: Yes   Plan:  Hold heparin ~1.5 hours Restart heparin at 800 units/hr at 0300 Recheck heparin level/PTT at 0900  Tyler Deis, Shea Stakes  [place] 04/08/2016,4:54 AM

## 2016-04-08 NOTE — Progress Notes (Signed)
PROGRESS NOTE  Kenneth Merritt  [id number] DOB: 08-Sep-1951 DOA: 04/06/2016 PCP: Neale Burly, MD Outpatient Specialists:  Subjective: Off of BiPAP, per nursing staff had harder BM yesterday with some blood streaks at the end of it.  Brief Narrative:  65 year old male with history of COPD and PAF came into the hospital because of falls and somnolence, acute respiratory distress was placed on BiPAP and admitted to stepdown. CXR showed congestion and possible pneumonia, CT of abdomen and pelvis showed colitis, per radiology differential include diverticulitis but leans more toward ischemic colitis  Assessment & Plan:   Principal Problem:   Sepsis ([place]) Active Problems:   COPD (chronic obstructive pulmonary disease) (HCC)   S/P aortobifemoral bypass surgery   CAD (coronary artery disease), native coronary artery   PAF (paroxysmal atrial fibrillation) (HCC)   HCAP (healthcare-associated pneumonia)   Elevated troponin    Sepsis Presented the hospital with pulse rate of 110 and WBC of 17,000 and probable pneumonia. Initially given IV fluids but patient already had fluid overload so discontinued. As was on broad-spectrum antibiotics on vancomycin and Zosyn. Sepsis pathophysiology appears to be improving. Procalcitonin was 2.9 and lactic acid was 4.55.  Acute on chronic diastolic CHF Patient has pulmonary congestion on x-ray, +2 lower extremity edema and BNP slightly elevated at 140. Started on IV Lasix 40 mg twice a day. Follow strict intake/output, daily weight and 2 g salt restriction, clinically he is improving.  Acute respiratory failure with hypoxia Patient developed respiratory distress while he was in the emergency department placed on BiPAP. Currently he is on nasal cannula, continue to monitor and step down. Wean to room air as tolerated.  Healthcare associated pneumonia Presented with tachycardia, shortness of breath and leukocytosis, CXR showed left basilar  opacity. Started on Zosyn and vancomycin, please know that his procalcitonin was 2.9 on admission. Supportive management with bronchodilators, mucolytics, antitussives and oxygen as needed.  Paroxysmal atrial fibrillation Patient currently on atrial fibrillation but his rate is controlled, he is on liquids at home. CHA2DS2-VASc is to for CAD and PAD. Placed transiently on heparin drip, will switch back to Eliquis as he is able to take oral medications.  Colitis Mentioned left flank pain, CT scan showed colitis cannot rule out ischemic. Pain is improving, patient is having bowel movements without difficulty, started on clear liquids. Avoid hypotension.  Elevated troponin Subtle elevation of troponin (0.18, 0.12 and 0.11) with a flat curve not suggesting ACS. Does not have chest pain or wall motion abnormalities in the echo, no further workup, continue beta blockers.   DVT prophylaxis: On Eliquis Code Status: Full Code Family Communication: Called her daughter Museum/gallery conservator yesterday. Disposition Plan: Likely can advance diet and get to the floor in 5/31 AM Diet: Diet clear liquid Room service appropriate?: Yes; Fluid consistency:: Thin  Consultants:   None  Procedures:   None  Antimicrobials:   None   Objective: Filed Vitals:   04/08/16 0700 04/08/16 0800 04/08/16 0825 04/08/16 0912  BP: 145/66 93/32 123/101   Pulse: 68 103 96   Temp:  97.7 F (36.5 C)    TempSrc:  Oral    Resp: 17 24 13    Height:      Weight:      SpO2: 100% 100% 100% 98%    Intake/Output Summary (Last 24 hours) at 04/08/16 1023 Last data filed at 04/08/16 0800  Gross per 24 hour  Intake   1353 ml  Output   3101 ml  Net  -1748  ml   Filed Weights   04/06/16 2200 04/07/16 0500  Weight: 66.4 kg (146 lb 6.2 oz) 68.2 kg (150 lb 5.7 oz)    Examination: General exam: Appears calm and comfortable  Respiratory system: Clear to auscultation. Respiratory effort normal. Cardiovascular system: S1 & S2  heard, RRR. No JVD, murmurs, rubs, gallops or clicks. No pedal edema. Gastrointestinal system: Abdomen is nondistended, soft and nontender. No organomegaly or masses felt. Normal bowel sounds heard. Central nervous system: Alert and oriented. No focal neurological deficits. Extremities: Symmetric 5 x 5 power. Skin: No rashes, lesions or ulcers Psychiatry: Judgement and insight appear normal. Mood & affect appropriate.   Data Reviewed: I have personally reviewed following labs and imaging studies  CBC:  Recent Labs Lab 04/02/16 1312 04/06/16 1900 04/07/16 0444 04/08/16 0524  WBC 12.4* 21.1* 17.8* 18.0*  NEUTROABS 9.8* 19.5* 16.9*  --   HGB 12.8* 12.3* 11.3* 10.4*  HCT 42.0 39.8 35.4* 33.1*  MCV 89.9 90.9 90.5 90.7  PLT 228 198 180 Q000111Q   Basic Metabolic Panel:  Recent Labs Lab 04/02/16 1312 04/06/16 1900 04/07/16 0444 04/08/16 0524  NA 138 136 138 141  K 4.7 3.9 4.8 4.0  CL 97* 95* 102 99*  CO2 32 26 26 31   GLUCOSE 129* 160* 202* 135*  BUN 19 26* 14 17  CREATININE 0.80 1.14 0.77 0.66  CALCIUM 9.1 8.6* 7.6* 7.8*   GFR: Estimated Creatinine Clearance: 78.1 mL/min (by C-G formula based on Cr of 0.66). Liver Function Tests:  Recent Labs Lab 04/06/16 1900 04/07/16 0444  AST 77* 84*  ALT 39 38  ALKPHOS 53 46  BILITOT 2.2* 1.8*  PROT 6.5 5.6*  ALBUMIN 3.4* 2.9*   No results for input(s): LIPASE, AMYLASE in the last 168 hours.  Recent Labs Lab 04/06/16 2238  AMMONIA 12   Coagulation Profile:  Recent Labs Lab 04/06/16 2238  INR 1.39   Cardiac Enzymes:  Recent Labs Lab 04/06/16 2238 04/07/16 0444 04/07/16 1015  TROPONINI 0.18* 0.12* 0.11*   BNP (last 3 results) No results for input(s): PROBNP in the last 8760 hours. HbA1C: No results for input(s): HGBA1C in the last 72 hours. CBG:  Recent Labs Lab 04/07/16 0733 04/07/16 1144 04/07/16 1716 04/07/16 2028 04/08/16 0730  GLUCAP 199* 143* 170* 128* 98   Lipid Profile: No results for  input(s): CHOL, HDL, LDLCALC, TRIG, CHOLHDL, LDLDIRECT in the last 72 hours. Thyroid Function Tests:  Recent Labs  04/06/16 2238  TSH 0.523   Anemia Panel: No results for input(s): VITAMINB12, FOLATE, FERRITIN, TIBC, IRON, RETICCTPCT in the last 72 hours. Urine analysis:    Component Value Date/Time   COLORURINE AMBER* 04/06/2016 1942   APPEARANCEUR CLOUDY* 04/06/2016 1942   LABSPEC 1.021 04/06/2016 1942   PHURINE 5.0 04/06/2016 1942   GLUCOSEU 250* 04/06/2016 1942   HGBUR MODERATE* 04/06/2016 1942   BILIRUBINUR MODERATE* 04/06/2016 1942   KETONESUR 40* 04/06/2016 1942   PROTEINUR NEGATIVE 04/06/2016 1942   UROBILINOGEN 0.2 01/21/2010 1944   NITRITE NEGATIVE 04/06/2016 1942   LEUKOCYTESUR NEGATIVE 04/06/2016 1942   Sepsis Labs: @LABRCNTIP (procalcitonin:4,lacticidven:4)  ) Recent Results (from the past 240 hour(s))  Blood Culture (routine x 2)     Status: None (Preliminary result)   Collection Time: 04/06/16  7:00 PM  Result Value Ref Range Status   Specimen Description BLOOD LEFT WRIST  Final   Special Requests IN PEDIATRIC BOTTLE 5CC  Final   Culture PENDING  Incomplete   Report Status PENDING  Incomplete  MRSA PCR Screening     Status: None   Collection Time: 04/06/16 10:21 PM  Result Value Ref Range Status   MRSA by PCR NEGATIVE NEGATIVE Final    Comment:        The GeneXpert MRSA Assay (FDA approved for NASAL specimens only), is one component of a comprehensive MRSA colonization surveillance program. It is not intended to diagnose MRSA infection nor to guide or monitor treatment for MRSA infections.      Invalid input(s): PROCALCITONIN, LACTICACIDVEN   Radiology Studies: Ct Abdomen Pelvis W Contrast  04/07/2016  CLINICAL DATA:  Left flank pain. Lethargy, frequent falls. Elevated white blood cell count. EXAM: CT ABDOMEN AND PELVIS WITH CONTRAST TECHNIQUE: Multidetector CT imaging of the abdomen and pelvis was performed using the standard protocol  following bolus administration of intravenous contrast. CONTRAST:  144mL ISOVUE-300 IOPAMIDOL (ISOVUE-300) INJECTION 61% COMPARISON:  Chest CT 03/27/2016. Report from CT abdomen/ pelvis 12/26/2012, images not available. CT 03/03/2012 FINDINGS: Lower chest: Consolidation in the lingula with air bronchograms, new from chest CT 10 days prior. Small focus of tree in bud opacities in the right lower lobe is also new. Right middle lobe scarring is unchanged. Liver: No focal lesion. Hepatobiliary: Gallbladder distention but no calcified stone or pericholecystic inflammation. Common bile duct normal in caliber. Pancreas: No ductal dilatation or inflammation. Spleen: Normal in size, no focal lesion. Adrenal glands: No nodule. Kidneys: Symmetric renal enhancement and excretion. No hydronephrosis. No perinephric stranding or focal abnormality. Stomach/Bowel: Stomach physiologically distended. There are no dilated or thickened small bowel loops. Mild soft tissue stranding in the left pericolic gutter, there is a questionable colonic wall thickening and small diverticulum at the junction of the descending and sigmoid colon best depicted on coronal image 53 series 602. This is in the region of the pericolonic fat stranding. No definite diverticular disease elsewhere. No pneumatosis or free air. Portions of the appendix are visualized and normal. There is no right-sided inflammation. Vascular/Lymphatic: No retroperitoneal adenopathy. Post abdominal aortobifemoral bypass graft no periaortic soft tissue stranding. The graft appears patent. Aortic dimensions at the proximal aspect of the graft 2.3 cm maximal dimension. No large vessel mesenteric occlusion. The IMA is not seen, likely excluded postsurgical. Celiac and superior mesenteric arteries are patent. Reproductive:  Brachytherapy seeds in the prostate gland. Bladder: Distended.  No bladder wall thickening. Other: No free air, free fluid, or intra-abdominal fluid collection.  Musculoskeletal: There are no acute or suspicious osseous abnormalities. Avascular necrosis of both femoral heads. Question subchondral collapse on the right. IMPRESSION: 1. Soft tissue stranding in the left pericolic gutter. There short segment colonic wall thickening with possible small diverticulum at the junction of the descending and sigmoid colon a region of fat stranding. Differential considerations include mild acute diverticulitis, however no significant diverticular burden is seen elsewhere. Focal colitis is considered, with distribution suggesting ischemic etiology. 2. 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 3. Lingular consolidation is new from chest CT eleven days prior. Additional right lower lobe tree in bud opacities. Findings suspicious for infectious etiology/ pneumonia and bronchiolitis. Electronically Signed   By: Jeb Levering M.D.   On: 04/07/2016 01:51   Dg Chest Port 1 View  04/07/2016  CLINICAL DATA:  Worsening shortness of breath. EXAM: PORTABLE CHEST 1 VIEW COMPARISON:  Radiograph yesterday at 1832 hours. Chest CT 03/27/2016 FINDINGS: Diminished vascular congestion from prior exam. Improved left basilar aeration with minimal residual patchy opacity. The lingular opacity on CT is not well seen. Hyperinflation  and emphysema. Biapical and mid lung zone scarring again seen. Cardiomediastinal contours are unchanged. IMPRESSION: Decreasing pulmonary vascular congestion. Lingular opacity on CT is not well seen. Minimal patchy left basilar opacity, with mild improvement from prior radiograph. Electronically Signed   By: Jeb Levering M.D.   On: 04/07/2016 05:55   Dg Chest Portable 1 View  04/06/2016  CLINICAL DATA:  Status post fall, with worsening shortness of breath. Syncope and lower extremity weakness. Initial encounter. EXAM: PORTABLE CHEST 1 VIEW COMPARISON:  Chest radiograph performed 04/02/2016 FINDINGS: The lungs are well-aerated. Vascular congestion is noted.  Mild left basilar airspace opacity could reflect pneumonia. Mild right midlung and right apical scarring is noted. There is no evidence of pleural effusion or pneumothorax. The cardiomediastinal silhouette is within normal limits. No acute osseous abnormalities are seen. IMPRESSION: Vascular congestion noted. Mild left basilar opacity could reflect pneumonia. Mild right midlung and right apical scarring noted. Electronically Signed   By: Garald Balding M.D.   On: 04/06/2016 19:00        Scheduled Meds: . apixaban  5 mg Oral BID  . furosemide  40 mg Intravenous BID  . insulin aspart  0-9 Units Subcutaneous TID WC  . ipratropium  0.5 mg Nebulization Q6H  . levalbuterol  0.63 mg Nebulization Q6H  . methylPREDNISolone (SOLU-MEDROL) injection  40 mg Intravenous Daily  . metoprolol  50 mg Oral BID  . oxyCODONE  15 mg Oral Q12H  . pantoprazole  40 mg Oral Daily  . piperacillin-tazobactam (ZOSYN)  IV  3.375 g Intravenous Q8H  . polyethylene glycol  17 g Oral Daily  . simvastatin  40 mg Oral q1800  . vancomycin  750 mg Intravenous Q12H   Continuous Infusions:     LOS: 2 days    Time spent: 35 minutes    Ignacia Gentzler A, MD [facility] Pager [phone]  If 7PM-7AM, please contact night-coverage [email or web address] Password [email or web address] 04/08/2016, 10:23 AM

## 2016-04-08 NOTE — Progress Notes (Signed)
Pt alert and oriented to person, place, time and why he's at the hospital.  Pt is stable on 3Lpm nasal cannula.  No BiPAP iondicated at this time.  RT will continue to monitor as needed.

## 2016-04-08 NOTE — Progress Notes (Signed)
Patient has new onset of non-rate controlled Afib. MD notified. Cardizem to be started

## 2016-04-09 LAB — GLUCOSE, CAPILLARY
GLUCOSE-CAPILLARY: 204 mg/dL — AB (ref 65–99)
GLUCOSE-CAPILLARY: 172 mg/dL — AB (ref 65–99)
Glucose-Capillary: 192 mg/dL — ABNORMAL HIGH (ref 65–99)
Glucose-Capillary: 149 mg/dL — ABNORMAL HIGH (ref 65–99)

## 2016-04-09 MED ORDER — GUAIFENESIN 100 MG/5ML PO SOLN
5.0000 mL | Freq: Four times a day (QID) | ORAL | Status: DC | PRN
  Administered 2016-04-09: 100 mg via ORAL
  Filled 2016-04-09: qty 10

## 2016-04-09 MED ORDER — HYDROCOD POLST-CPM POLST ER 10-8 MG/5ML PO SUER
5.0000 mL | Freq: Once | ORAL | Status: AC
  Administered 2016-04-09: 5 mL via ORAL
  Filled 2016-04-09: qty 5

## 2016-04-09 NOTE — Progress Notes (Signed)
Pt alert and oriented.  Pt stable sitting on the edge of the bed wearing 3 Lpm nasal cannula.  No BiPAP indicated at this time.  RT will continue to monitor as needed

## 2016-04-09 NOTE — Progress Notes (Signed)
Pharmacy Antibiotic Note  Kenneth Merritt is a 65 y.o. male admitted on 04/06/2016 with sepsis 2/2 possible HCAP.  Pharmacy has been consulted for vancomycin and zosyn dosing.    With MRSA PCR negative and blood cultures negative x 2 days, vancomycin will be discontinued today per discussion with Dr. Charlies Silvers.  Plan: Continue Zosyn 3.375g IV q8h (4 hour infusion time).   Height: 5\' 4"  (162.6 cm) Weight: 152 lb 5.4 oz (69.1 kg) IBW/kg (Calculated) : 59.2  Temp (24hrs), Avg:97.9 F (36.6 C), Min:97.6 F (36.4 C), Max:98.6 F (37 C)   Recent Labs Lab 04/02/16 1312 04/06/16 1900 04/06/16 1909 04/06/16 2238 04/07/16 0143 04/07/16 0444 04/08/16 0524  WBC 12.4* 21.1*  --   --   --  17.8* 18.0*  CREATININE 0.80 1.14  --   --   --  0.77 0.66  LATICACIDVEN  --   --  4.55* 3.8* 2.2*  --  1.6    Estimated Creatinine Clearance: 78.1 mL/min (by C-G formula based on Cr of 0.66).    Allergies  Allergen Reactions  . Levaquin [Levofloxacin In D5w] Other (See Comments)    Pt states it caused dry mouth and rectal bleeding.     Antimicrobials this admission: 5/28 Vanc >> 5/31 5/28 Zosyn >>  Dose adjustments this admission:  Microbiology results: 5/28 MRSA PCR: negative 5/28 BCx: ngtd x 2 days 5/28 UCx: NGF  Thank you for allowing pharmacy to be a part of this patient's care.  Hershal Coria 04/09/2016 12:12 PM

## 2016-04-09 NOTE — Progress Notes (Addendum)
Patient ID: Kenneth Merritt, male   DOB: 1951/03/04, 65 y.o.   MRN: [id number]  PROGRESS NOTE    Kenneth Merritt  [id number] DOB: 1951/08/13 DOA: 04/06/2016  PCP: Neale Burly, MD   Brief Narrative:   65 year old male with history of COPD and PAF on anticoagulation with Eliquis, hypertension, dyslipidemia who presented to [facility] ED with altered metnal status and respiratory distress with hypoxia requiring BIPAP. CXR showed congestion and possible pneumonia, CT of abdomen and pelvis showed colitis.  Assessment & Plan:  Sepsis secondary to pneumonia and colitis / Leukocytosis - Sepsis present on admission with tachycardia, tachypnea, hypoxia, leukocytosis. Procalcitonin was 2.9 and lactic acid was 4.55. - Presumed source of infection is pneumonia and /or colitis - He is on Zosyn (Vanco stopped today  - Currently on VM and O2 saturation 97% - Continue current nebulizer treatments  - Blood cultures so far show no growth   Acute on chronic diastolic CHF - Pulmonary congestion on x-ray, +2 lower extremity edema and BNP slightly elevated at 140. - Continue  IV Lasix 40 mg twice a day. - Weight in past 48 hours: 66.4 kg --> 68.2 kg --> 69.1 kg - Cr is WNL  Acute respiratory failure with hypoxia / HCAP / Acute COPD exacerbation  - Now on VM and saturating 97% - Continue zosyn as noted above - Continue current steroid treatment: solumedrol 40 mg IV daily - Continue current nebulizers  Paroxysmal atrial fibrillation - CHADS vasc score is 4 - Continue Eliquis for AC - Continue rate control with Cardizem drip and metoprolol  Essential hypertension - Continue metoprolol    Colitis - CT scan showed colitis cannot rule out ischemic. - On clear liquids, will not advance until mental status better   Elevated troponin - Likely demand ischemia from hypoxic respiratory failure - No chest pain    DVT prophylaxis: On full dose anticoagulation with Eliquis Code Status: Full Code Family  Communication: Family not at the bedside this am; called pt daughter [phone], not able to speak with her, no answer, called pt spouse Jackelyn Poling at [phone]; left my Cell number on her VM to call me back for updates Disposition Plan: on ventimask, is not stable for transfer out of SDU   Consultants:   None  Procedures:   None  Antimicrobials:   Zosyn  Vanco stopped 04/09/2016    Subjective: No overnight events.   Objective: Filed Vitals:   04/09/16 0326 04/09/16 0400 04/09/16 0500 04/09/16 0700  BP:  100/82 101/71 103/74  Pulse:  84 76 72  Temp: 97.6 F (36.4 C)     TempSrc: Oral     Resp:  6 7 6   Height:      Weight:   69.1 kg (152 lb 5.4 oz)   SpO2:  97% 98% 98%    Intake/Output Summary (Last 24 hours) at 04/09/16 0741 Last data filed at 04/09/16 0400  Gross per 24 hour  Intake 283.15 ml  Output   3025 ml  Net -2741.85 ml   Filed Weights   04/06/16 2200 04/07/16 0500 04/09/16 0500  Weight: 66.4 kg (146 lb 6.2 oz) 68.2 kg (150 lb 5.7 oz) 69.1 kg (152 lb 5.4 oz)    Examination:  General exam: Has venti mask, no distress Respiratory system: Rattling sounds on wheezing, wheezing in upper lung lobes Cardiovascular system: tachcyardic, appreciate S1, S2. Gastrointestinal system: Abdomen is distended, bowel sounds appreciated  Central nervous system: Non focal but pt lethargic and not  following commands  Extremities: Symmetric extremities, trace pitting LE edema  Skin: No rashes, lesions or ulcers Psychiatry: Normal mood, no agitation   Data Reviewed: I have personally reviewed following labs and imaging studies  CBC:  Recent Labs Lab 04/02/16 1312 04/06/16 1900 04/07/16 0444 04/08/16 0524  WBC 12.4* 21.1* 17.8* 18.0*  NEUTROABS 9.8* 19.5* 16.9*  --   HGB 12.8* 12.3* 11.3* 10.4*  HCT 42.0 39.8 35.4* 33.1*  MCV 89.9 90.9 90.5 90.7  PLT 228 198 180 Q000111Q   Basic Metabolic Panel:  Recent Labs Lab 04/02/16 1312 04/06/16 1900 04/07/16 0444  04/08/16 0524  NA 138 136 138 141  K 4.7 3.9 4.8 4.0  CL 97* 95* 102 99*  CO2 32 26 26 31   GLUCOSE 129* 160* 202* 135*  BUN 19 26* 14 17  CREATININE 0.80 1.14 0.77 0.66  CALCIUM 9.1 8.6* 7.6* 7.8*   GFR: Estimated Creatinine Clearance: 78.1 mL/min (by C-G formula based on Cr of 0.66). Liver Function Tests:  Recent Labs Lab 04/06/16 1900 04/07/16 0444  AST 77* 84*  ALT 39 38  ALKPHOS 53 46  BILITOT 2.2* 1.8*  PROT 6.5 5.6*  ALBUMIN 3.4* 2.9*   No results for input(s): LIPASE, AMYLASE in the last 168 hours.  Recent Labs Lab 04/06/16 2238  AMMONIA 12   Coagulation Profile:  Recent Labs Lab 04/06/16 2238  INR 1.39   Cardiac Enzymes:  Recent Labs Lab 04/06/16 2238 04/07/16 0444 04/07/16 1015  TROPONINI 0.18* 0.12* 0.11*   BNP (last 3 results) No results for input(s): PROBNP in the last 8760 hours. HbA1C: No results for input(s): HGBA1C in the last 72 hours. CBG:  Recent Labs Lab 04/07/16 2028 04/08/16 0730 04/08/16 1132 04/08/16 1625 04/08/16 2132  GLUCAP 128* 98 143* 264* 130*   Lipid Profile: No results for input(s): CHOL, HDL, LDLCALC, TRIG, CHOLHDL, LDLDIRECT in the last 72 hours. Thyroid Function Tests:  Recent Labs  04/06/16 2238  TSH 0.523   Anemia Panel: No results for input(s): VITAMINB12, FOLATE, FERRITIN, TIBC, IRON, RETICCTPCT in the last 72 hours. Urine analysis:    Component Value Date/Time   COLORURINE AMBER* 04/06/2016 1942   APPEARANCEUR CLOUDY* 04/06/2016 1942   LABSPEC 1.021 04/06/2016 1942   PHURINE 5.0 04/06/2016 1942   GLUCOSEU 250* 04/06/2016 1942   HGBUR MODERATE* 04/06/2016 1942   BILIRUBINUR MODERATE* 04/06/2016 1942   KETONESUR 40* 04/06/2016 1942   PROTEINUR NEGATIVE 04/06/2016 1942   UROBILINOGEN 0.2 01/21/2010 1944   NITRITE NEGATIVE 04/06/2016 1942   LEUKOCYTESUR NEGATIVE 04/06/2016 1942   Sepsis Labs: @LABRCNTIP (procalcitonin:4,lacticidven:4)   Blood Culture (routine x 2)     Status: None  (Preliminary result)   Collection Time: 04/06/16  6:40 PM  Result Value Ref Range Status   Specimen Description BLOOD RIGHT ANTECUBITAL  Final   Special Requests BOTTLES DRAWN AEROBIC AND ANAEROBIC 5CC EA  Final   Culture   Final    NO GROWTH 1 DAY Performed at [facility]    Report Status PENDING  Incomplete  Blood Culture (routine x 2)     Status: None (Preliminary result)   Collection Time: 04/06/16  7:00 PM  Result Value Ref Range Status   Specimen Description BLOOD LEFT WRIST  Final   Special Requests IN PEDIATRIC BOTTLE 5CC  Final   Culture   Final    NO GROWTH 1 DAY Performed at [facility]    Report Status PENDING  Incomplete  Urine culture  Status: None   Collection Time: 04/06/16  7:42 PM  Result Value Ref Range Status   Specimen Description URINE, CATHETERIZED  Final   Special Requests NONE  Final   Culture NO GROWTH   Final   Report Status 04/08/2016 FINAL  Final  MRSA PCR Screening     Status: None   Collection Time: 04/06/16 10:21 PM  Result Value Ref Range Status   MRSA by PCR NEGATIVE NEGATIVE Final      Radiology Studies: Ct Abdomen Pelvis W Contrast 04/07/2016 1. Soft tissue stranding in the left pericolic gutter. There short segment colonic wall thickening with possible small diverticulum at the junction of the descending and sigmoid colon a region of fat stranding. Differential considerations include mild acute diverticulitis, however no significant diverticular burden is seen elsewhere. Focal colitis is considered, with distribution suggesting ischemic etiology. 2. 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 3. Lingular consolidation is new from chest CT eleven days prior. Additional right lower lobe tree in bud opacities. Findings suspicious for infectious etiology/ pneumonia and bronchiolitis.   Dg Chest Port 1 View 04/07/2016 Decreasing pulmonary vascular congestion. Lingular opacity on CT is not well seen. Minimal patchy  left basilar opacity, with mild improvement from prior radiograph.   Dg Chest Portable 1 View 04/06/2016  Vascular congestion noted. Mild left basilar opacity could reflect pneumonia. Mild right midlung and right apical scarring noted.    Scheduled Meds: . apixaban  5 mg Oral BID  . furosemide  40 mg Intravenous BID  . insulin aspart  0-9 Units Subcutaneous TID WC  . ipratropium  0.5 mg Nebulization Q6H  . levalbuterol  0.63 mg Nebulization Q6H  . methylPREDNISolone (SOLU-MEDROL) injection  40 mg Intravenous Daily  . metoprolol  50 mg Oral BID  . oxyCODONE  15 mg Oral Q12H  . pantoprazole  40 mg Oral Daily  . piperacillin-tazobactam (ZOSYN)  IV  3.375 g Intravenous Q8H  . polyethylene glycol  17 g Oral Daily  . simvastatin  40 mg Oral q1800  . vancomycin  750 mg Intravenous Q12H   Continuous Infusions: . diltiazem (CARDIZEM) infusion 10 mg/hr (04/09/16 0736)     LOS: 3 days    Time spent: 25 minutes  Greater than 50% of the time spent on counseling and coordinating the care.   Leisa Lenz, MD [facility] Pager [phone]  If 7PM-7AM, please contact night-coverage [email or web address] Password [facility] 04/09/2016, 7:41 AM

## 2016-04-09 NOTE — Progress Notes (Signed)
PT requested neb tx- RN called RT. RT informed PT that he received his last scheduled tx at 1442 and a prn tx at 1633 (therefore, unable to give neb tx at this time). RT did suggest NTS and or BiPAP- PT refused both. Current Sp02 98% on 3 lpm Luna, RR 12, BBS coarse- RH. PT now states he is breathing fine. RN aware and is caring for PT. RT has been and continues to encourage deep breathing and coughing- resulting in small, productive cough.

## 2016-04-09 NOTE — Progress Notes (Signed)
PT is aware of time, location, and can identify family member.

## 2016-04-10 ENCOUNTER — Inpatient Hospital Stay (HOSPITAL_COMMUNITY): Payer: Medicare Other

## 2016-04-10 LAB — GLUCOSE, CAPILLARY
GLUCOSE-CAPILLARY: 165 mg/dL — AB (ref 65–99)
GLUCOSE-CAPILLARY: 151 mg/dL — AB (ref 65–99)
Glucose-Capillary: 174 mg/dL — ABNORMAL HIGH (ref 65–99)
Glucose-Capillary: 159 mg/dL — ABNORMAL HIGH (ref 65–99)

## 2016-04-10 LAB — BASIC METABOLIC PANEL
Anion gap: 11 (ref 5–15)
BUN: 17 mg/dL (ref 6–20)
CALCIUM: 8.4 mg/dL — AB (ref 8.9–10.3)
CO2: 37 mmol/L — ABNORMAL HIGH (ref 22–32)
Chloride: 90 mmol/L — ABNORMAL LOW (ref 101–111)
Creatinine, Ser: 0.62 mg/dL (ref 0.61–1.24)
GFR calc Af Amer: >60 mL/min (ref >60)
Glucose, Bld: 177 mg/dL — ABNORMAL HIGH (ref 65–99)
POTASSIUM: 3.4 mmol/L — AB (ref 3.5–5.1)
SODIUM: 138 mmol/L (ref 135–145)

## 2016-04-10 LAB — CBC
HCT: 33.5 % — ABNORMAL LOW (ref 39.0–52.0)
Hemoglobin: 10.5 g/dL — ABNORMAL LOW (ref 13.0–17.0)
MCH: 28.2 pg (ref 26.0–34.0)
MCHC: 31.3 g/dL (ref 30.0–36.0)
MCV: 90.1 fL (ref 78.0–100.0)
PLATELETS: 204 10*3/uL (ref 150–400)
RBC: 3.72 MIL/uL — AB (ref 4.22–5.81)
RDW: 14.4 % (ref 11.5–15.5)
WBC: 14.7 10*3/uL — ABNORMAL HIGH (ref 4.0–10.5)

## 2016-04-10 MED ORDER — BISACODYL 10 MG RE SUPP
10.0000 mg | Freq: Two times a day (BID) | RECTAL | Status: AC
  Administered 2016-04-10: 10 mg via RECTAL
  Filled 2016-04-10: qty 1

## 2016-04-10 MED ORDER — POLYETHYLENE GLYCOL 3350 17 G PO PACK
17.0000 g | PACK | Freq: Two times a day (BID) | ORAL | Status: DC
  Administered 2016-04-11 – 2016-04-12 (×3): 17 g via ORAL
  Filled 2016-04-10 (×5): qty 1

## 2016-04-10 MED ORDER — POTASSIUM CHLORIDE 10 MEQ/100ML IV SOLN
10.0000 meq | INTRAVENOUS | Status: AC
  Administered 2016-04-10 (×3): 10 meq via INTRAVENOUS
  Filled 2016-04-10 (×3): qty 100

## 2016-04-10 MED ORDER — HYDRALAZINE HCL 20 MG/ML IJ SOLN
5.0000 mg | Freq: Once | INTRAMUSCULAR | Status: AC
  Administered 2016-04-10: 5 mg via INTRAVENOUS
  Filled 2016-04-10: qty 1

## 2016-04-10 MED ORDER — BISACODYL 10 MG RE SUPP: 10.0000 mg | Freq: Once | RECTAL | Status: DC

## 2016-04-10 MED ORDER — LEVALBUTEROL HCL 0.63 MG/3ML IN NEBU
0.6300 mg | INHALATION_SOLUTION | RESPIRATORY_TRACT | Status: DC | PRN
  Administered 2016-04-10 – 2016-04-12 (×6): 0.63 mg via RESPIRATORY_TRACT
  Filled 2016-04-10 (×6): qty 3

## 2016-04-10 MED ORDER — IPRATROPIUM BROMIDE 0.02 % IN SOLN
0.5000 mg | RESPIRATORY_TRACT | Status: DC | PRN
  Administered 2016-04-10 – 2016-04-12 (×6): 0.5 mg via RESPIRATORY_TRACT
  Filled 2016-04-10 (×6): qty 2.5

## 2016-04-10 NOTE — Clinical Documentation Improvement (Addendum)
Hospitalist   Please do not document query responses on the CDI BPA in [facility]. Please do not deactivate queries without responding to them. Thank you!  Query 1 of 2 Possible Clinical Conditions:  - Acute Kidney Injury, resolved  - Other condition  - Unable to clinically determine  Clinical Information: Patient's creatinine improved by 0.48 mg/dL in approximately 48 hours, 04/06/16 to 04/08/16 Patient received IV fluids at 100 ml/hr for approximately 8 hours per [facility]. Fluid bolus was given in ED per ED physician documentation BUN/Cr/GFR trend for 48 hours (white male) Component     Latest Ref Rng 04/06/2016 04/07/2016 04/08/2016  BUN     6 - 20 mg/dL 26 (H) 14 17  Creatinine     0.61 - 1.24 mg/dL 1.14 0.77 0.66  EGFR (Non-African Amer.)     >60 mL/min >60 >60 >60    Query 2 of 2  "Glucophage 1000 mg daily" is listed as a home medication for this patient in the H&P and ED provider note. Fingerstick Blood Glucoses this admission have ranged from 265 to 98 per [facility] trends. Patient has received Solumedrol this admission  Please clarify if the patient is Diabetic, including the Type and any associated conditions, if applicable.   Please exercise your independent, professional judgment when responding. A specific answer is not anticipated or expected.   Thank You, Erling Conte  RN BSN CCDS [phone] [facility] Information Management [place]      Queries reviewed. Added diabetes mellitus without complications without long-term insulin use. In regards to elevated the when this is most likely reflective of dehydration rather than acute kidney injury. I added to dehydration in progress note 04/10/2016.  Thank you   Leisa Lenz

## 2016-04-10 NOTE — Progress Notes (Addendum)
Patient ID: Kenneth Merritt, male   DOB: Apr 12, 1951, 65 y.o.   MRN: [id number]  PROGRESS NOTE    Kenneth Merritt  [id number] DOB: [birth year] DOA: 04/06/2016  PCP: Neale Burly, MD   Brief Narrative:   65 year old male with history of COPD and PAF on anticoagulation with Eliquis, hypertension, dyslipidemia who presented to [facility] ED with altered metnal status and respiratory distress with hypoxia requiring BIPAP. CXR showed congestion and possible pneumonia, CT of abdomen and pelvis showed colitis.  Patient was in more respiratory distress 04/09/2016, requiring Ventimask. This morning his respiratory status is better.  Assessment & Plan:  Sepsis secondary to pneumonia / Leukocytosis - Sepsis present on admission with tachycardia, tachypnea, hypoxia, leukocytosis. Procalcitonin was 2.9 and lactic acid was 4.55. - Presumed source of infection is pneumonia and /or colitis - Patient initially on vancomycin and Zosyn. Since blood cultures so far show no growth. We stopped vancomycin 04/09/2016 - We will continue Zosyn - Would continue to monitor in step down unit for at least next 24 hours to make sure he remains stable on nasal cannula oxygen support - Continue current nebulizer treatments which include Atrovent and Xopenex every 6 hours scheduled in addition to every 2 hours as needed regimen for shortness of breath or wheezing  Acute on chronic diastolic CHF - Pulmonary congestion on x-ray - Lower extremity edema improving - He is on Lasix 40 mg IV twice daily - Weight in past 96 hours: 66.4 kg --> 68.2 kg --> 69.1 kg --> 67.2 kg  - Cr is WNL  Acute respiratory failure with hypoxia / HCAP / Acute COPD exacerbation  - Stable respiratory status - He is on nasal cannula oxygen support - Continue Zosyn which will cover for pneumonia as well as colitis - Would continue Solu-Medrol 40 mg IV daily, he still wheezing on physical exam - Continue current nebulizer treatments  Steroid-induced  hyperglycemia / diabetes mellitus without complications without long-term insulin use - Continue sliding scale insulin - At home he is on metformin  Dehydration - Please note patient has normal creatinine level, BUN was elevated on 04/06/2016 at 26 but this is likely reflective of dehydration rather than acute kidney injury.  Paroxysmal atrial fibrillation - CHADS vasc score is 4 - Continue Eliquis for [facility] - rate controlled with metoprolol. He was on Cardizem drip for episodes of A. fib with RVR but currently rate controlled without Cardizem drip. Cardizem drip off at 4:00 this morning  Essential hypertension - Continue metoprolol 50 mg twice daily   Colitis - CT scan showed colitis  - He still has distended abdomen and last bowel movement was 04/08/2016 - GI consulted, we appreciate the recommendations - He is on clear liquid diet - We'll obtain abdominal x-ray to make sure no obstruction  Elevated troponin - Likely demand ischemia from hypoxic respiratory failure - No chest pain    DVT prophylaxis: On full dose anticoagulation with Eliquis Code Status: Full Code Family Communication: Family not at the bedside this am; called pt daughter [phone], not able to speak with her, no answer, called pt spouse Jackelyn Poling at [phone]; left my Cell number on her VM to call me back for updates; spoke with Hinton Dyer pt daughter [phone] or [phone] 6/1 Disposition Plan: Monitor in step down unit for at least next 24 hours   Consultants:   Gastroenterology   Procedures:   2 D ECHO 03/2016 - -EF 55% and garde 1 DD  LE doppler 5/29/201 - no  evidence of DVT  Antimicrobials:   Zosyn 04/06/2016 -->  Vanco stopped 04/09/2016    Subjective: No overnight events. No respiratory distress.  Objective: Filed Vitals:   04/10/16 0434 04/10/16 0600 04/10/16 0800 04/10/16 0821  BP:  91/76 158/97   Pulse:  71 98   Temp:   97.5 F (36.4 C)   TempSrc:   Oral   Resp:  5 11   Height:        Weight: 67.2 kg (148 lb 2.4 oz)     SpO2:  99% 98% 97%    Intake/Output Summary (Last 24 hours) at 04/10/16 0856 Last data filed at 04/10/16 0435  Gross per 24 hour  Intake 1424.22 ml  Output   2745 ml  Net -1320.78 ml   Filed Weights   04/07/16 0500 04/09/16 0500 04/10/16 0434  Weight: 68.2 kg (150 lb 5.7 oz) 69.1 kg (152 lb 5.4 oz) 67.2 kg (148 lb 2.4 oz)    Examination:  General exam: more alert this am, on [place] oxygen  Respiratory system: wheezing in upper lung lobes, no rhonchi  Cardiovascular system: tachcyardic, S1-S2 appreciated Gastrointestinal system: Appreciate bowel sounds, distended abdomen but not tender Central nervous system: More alert, no focal deficits Extremities: Symmetric extremities, trace lower extremity pitting edema Skin: Skin is warm and dry Psychiatry: No restlessness, normal mood  Data Reviewed: I have personally reviewed following labs and imaging studies  CBC:  Recent Labs Lab 04/06/16 1900 04/07/16 0444 04/08/16 0524 04/10/16 0311  WBC 21.1* 17.8* 18.0* 14.7*  NEUTROABS 19.5* 16.9*  --   --   HGB 12.3* 11.3* 10.4* 10.5*  HCT 39.8 35.4* 33.1* 33.5*  MCV 90.9 90.5 90.7 90.1  PLT 198 180 184 0000000   Basic Metabolic Panel:  Recent Labs Lab 04/06/16 1900 04/07/16 0444 04/08/16 0524 04/10/16 0311  NA 136 138 141 138  K 3.9 4.8 4.0 3.4*  CL 95* 102 99* 90*  CO2 26 26 31  37*  GLUCOSE 160* 202* 135* 177*  BUN 26* 14 17 17   CREATININE 1.14 0.77 0.66 0.62  CALCIUM 8.6* 7.6* 7.8* 8.4*   GFR: Estimated Creatinine Clearance: 78.1 mL/min (by C-G formula based on Cr of 0.62). Liver Function Tests:  Recent Labs Lab 04/06/16 1900 04/07/16 0444  AST 77* 84*  ALT 39 38  ALKPHOS 53 46  BILITOT 2.2* 1.8*  PROT 6.5 5.6*  ALBUMIN 3.4* 2.9*   No results for input(s): LIPASE, AMYLASE in the last 168 hours.  Recent Labs Lab 04/06/16 2238  AMMONIA 12   Coagulation Profile:  Recent Labs Lab 04/06/16 2238  INR 1.39   Cardiac  Enzymes:  Recent Labs Lab 04/06/16 2238 04/07/16 0444 04/07/16 1015  TROPONINI 0.18* 0.12* 0.11*   BNP (last 3 results) No results for input(s): PROBNP in the last 8760 hours. HbA1C: No results for input(s): HGBA1C in the last 72 hours. CBG:  Recent Labs Lab 04/09/16 0725 04/09/16 1129 04/09/16 1656 04/09/16 2113 04/10/16 0727  GLUCAP 192* 204* 149* 172* 165*   Lipid Profile: No results for input(s): CHOL, HDL, LDLCALC, TRIG, CHOLHDL, LDLDIRECT in the last 72 hours. Thyroid Function Tests: No results for input(s): TSH, T4TOTAL, FREET4, T3FREE, THYROIDAB in the last 72 hours. Anemia Panel: No results for input(s): VITAMINB12, FOLATE, FERRITIN, TIBC, IRON, RETICCTPCT in the last 72 hours. Urine analysis:    Component Value Date/Time   COLORURINE AMBER* 04/06/2016 1942   APPEARANCEUR CLOUDY* 04/06/2016 1942   LABSPEC 1.021 04/06/2016 1942   PHURINE 5.0  04/06/2016 1942   GLUCOSEU 250* 04/06/2016 1942   HGBUR MODERATE* 04/06/2016 1942   BILIRUBINUR MODERATE* 04/06/2016 1942   KETONESUR 40* 04/06/2016 1942   PROTEINUR NEGATIVE 04/06/2016 1942   UROBILINOGEN 0.2 01/21/2010 1944   NITRITE NEGATIVE 04/06/2016 1942   LEUKOCYTESUR NEGATIVE 04/06/2016 1942   Sepsis Labs: @LABRCNTIP (procalcitonin:4,lacticidven:4)   Blood Culture (routine x 2)     Status: None (Preliminary result)   Collection Time: 04/06/16  6:40 PM  Result Value Ref Range Status   Specimen Description BLOOD RIGHT ANTECUBITAL  Final   Special Requests BOTTLES DRAWN AEROBIC AND ANAEROBIC 5CC EA  Final   Culture   Final    NO GROWTH 1 DAY Performed at [facility]    Report Status PENDING  Incomplete  Blood Culture (routine x 2)     Status: None (Preliminary result)   Collection Time: 04/06/16  7:00 PM  Result Value Ref Range Status   Specimen Description BLOOD LEFT WRIST  Final   Special Requests IN PEDIATRIC BOTTLE 5CC  Final   Culture   Final    NO GROWTH 1 DAY Performed at [facility]    Report Status PENDING  Incomplete  Urine culture     Status: None   Collection Time: 04/06/16  7:42 PM  Result Value Ref Range Status   Specimen Description URINE, CATHETERIZED  Final   Special Requests NONE  Final   Culture NO GROWTH   Final   Report Status 04/08/2016 FINAL  Final  MRSA PCR Screening     Status: None   Collection Time: 04/06/16 10:21 PM  Result Value Ref Range Status   MRSA by PCR NEGATIVE NEGATIVE Final      Radiology Studies:  Dg Chest Port 1 View 04/10/2016  No acute abnormality noted. Electronically Signed   By: Inez Catalina M.D.   On: 04/10/2016 08:09   Ct Abdomen Pelvis W Contrast 04/07/2016 1. Soft tissue stranding in the left pericolic gutter. There short segment colonic wall thickening with possible small diverticulum at the junction of the descending and sigmoid colon a region of fat stranding. Differential considerations include mild acute diverticulitis, however no significant diverticular burden is seen elsewhere. Focal colitis is considered, with distribution suggesting ischemic etiology. 2. 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 3. Lingular consolidation is new from chest CT eleven days prior. Additional right lower lobe tree in bud opacities. Findings suspicious for infectious etiology/ pneumonia and bronchiolitis.   Dg Chest Port 1 View 04/07/2016 Decreasing pulmonary vascular congestion. Lingular opacity on CT is not well seen. Minimal patchy left basilar opacity, with mild improvement from prior radiograph.   Dg Chest Portable 1 View 04/06/2016  Vascular congestion noted. Mild left basilar opacity could reflect pneumonia. Mild right midlung and right apical scarring noted.    Scheduled Meds: . apixaban  5 mg Oral BID  . bisacodyl  10 mg Rectal Once  . furosemide  40 mg Intravenous BID  . insulin aspart  0-9 Units Subcutaneous TID WC  . ipratropium  0.5 mg Nebulization Q6H  . levalbuterol  0.63 mg Nebulization Q6H  .  methylPREDNISolone (SOLU-MEDROL) injection  40 mg Intravenous Daily  . metoprolol  50 mg Oral BID  . oxyCODONE  15 mg Oral Q12H  . pantoprazole  40 mg Oral Daily  . piperacillin-tazobactam (ZOSYN)  IV  3.375 g Intravenous Q8H  . polyethylene glycol  17 g Oral Daily  . simvastatin  40 mg Oral q1800  Continuous Infusions: . diltiazem (CARDIZEM) infusion Stopped (04/10/16 0435)     LOS: 4 days    Time spent: 25 minutes  Greater than 50% of the time spent on counseling and coordinating the care.   Leisa Lenz, MD [facility] Pager [phone]  If 7PM-7AM, please contact night-coverage [email or web address] Password [email or web address] 04/10/2016, 8:56 AM

## 2016-04-10 NOTE — Progress Notes (Signed)
Date:  April 10, 2016 Chart reviewed for concurrent status and case management needs. Will continue to follow the patient for changes and needs: hypotensive and iv boluses Expected discharge date: [id number] Velva Harman, [place], [place], [place]

## 2016-04-10 NOTE — Consult Note (Signed)
[name] Gastroenterology Consultation Note  Referring Provider: Dr. Leisa Lenz [facility]) Primary Care Physician:  Neale Burly, MD  Reason for Consultation:  colitis  HPI: Kenneth Merritt is a 65 y.o. male whom we've been asked to see for colitis.  Patient has history of COPD and has had recently become more short-of-breath and had productive cough.  He came to ED for 4-day history of escalating sharp left-sided abdominal pain, constant, no clear worsening after eating, no other clear alleviating or exacerbating factors. No fevers.  Some nausea; no vomiting.  Has had constipation, scant bowel movements in 3-4 days.  Occasional blood in stool.  No prior abdominal pain.  History of back pain and chronic pain syndrome; on narcotics.  Had colonoscopy about 1 year ago, in [place], showing polyp otherwise normal per patient report (I don't have the records).   Past Medical History  Diagnosis Date  . GERD (gastroesophageal reflux disease)   . COPD (chronic obstructive pulmonary disease) ([place])     severe by PFTs August 2010  . 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 Atrial fibrillation ([facility])     post-op a-fib. 3/11. post Aortobifem , /  Atrial fibrillation from nebulizer treatment  May, 2012, while hospitalized  . Renal artery stenosis ([place])      presumably corrected at time of aortobifem surgery March, 2011  . Tobacco user      stop smoking  . Pneumothorax      2011  before aortobifemoral surgery,  resolved  . Peripheral vascular disease, unspecified ([place])     bilateral intermittent claudication  . CAD (coronary artery disease), native coronary artery     catheterization 06/2009.Marland KitchenMarland Kitchen70% proximal LAD and 30% proximal RCA. normal LV function, no intervention planned prior to surgery for aorta. LV normal function  by catheter 8/10.   [name] Dyslipidemia   . DJD (degenerative joint disease)   . Ejection fraction      60%, echo, May, 2012, rapid atrial fib at the time  . Cancer [facility]) 2013    Prostate Radiation  . Myocardial infarction ([place]) 2011  . On home O2     2L N/C  . Chronic thoracic back pain   . Chronic pain syndrome     Past Surgical History  Procedure Laterality Date  . Abdominal aortic aneurysm repair  01/11/2010  . Pr vein bypass graft,aorto-fem-pop    . Inguinal hernia repair      RIGHT    Prior to Admission medications   Medication Sig Start Date End Date Taking? Authorizing Provider  acetaminophen (TYLENOL) 500 MG tablet Take 1,000 mg by mouth every 6 (six) hours as needed for mild pain.   Yes Historical Provider, MD  albuterol (PROVENTIL HFA;VENTOLIN HFA) 108 (90 BASE) MCG/ACT inhaler Inhale 2 puffs into the lungs every 4 (four) hours as needed for wheezing or shortness of breath. 09/19/15  Yes Donne [name], MD  albuterol (PROVENTIL) (2.5 MG/3ML) 0.083% nebulizer solution Take 3 mLs (2.5 mg total) by nebulization every 4 (four) hours as needed for wheezing or shortness of breath. 03/15/15  Yes [name], MD  ALPRAZolam [name]) 0.25 MG tablet Take 0.25 mg by mouth at bedtime as needed for sleep.  12/17/15  Yes Historical Provider, MD  ELIQUIS 5 MG TABS tablet TAKE 1 TABLET BY MOUTH TWICE A DAY 02/11/16  Yes Satira Sark, MD  gabapentin (NEURONTIN) 300 MG capsule Take two capsules by mouth twice daily 10/30/15  Yes Historical Provider, MD  HYDROcodone-acetaminophen (NORCO/VICODIN) 5-325 MG tablet Take 2 tablets by mouth every 4 (four) hours as needed. Patient taking differently: Take 2 tablets by mouth every 4 (four) hours as needed for moderate pain or severe pain.  04/02/16  Yes Tanna Furry, MD  ipratropium (ATROVENT) 0.02 % nebulizer solution Take 0.5 mg by nebulization every 4 (four) hours as needed for wheezing or shortness of breath.   Yes Historical Provider, MD  metFORMIN (GLUCOPHAGE) 1000 MG tablet Take 1,000 mg by mouth daily with breakfast.   Yes Historical Provider, MD   methocarbamol (ROBAXIN) 500 MG tablet Take 2 tablets (1,000 mg total) by mouth 4 (four) times daily as needed for muscle spasms (muscle spasm/pain). 03/27/16  Yes Francine Graven, DO  metoprolol (LOPRESSOR) 50 MG tablet Take 50 mg by mouth 2 (two) times daily. 12/27/13  Yes Historical Provider, MD  nitroGLYCERIN (NITROSTAT) 0.4 MG SL tablet Place 1 tablet (0.4 mg total) under the tongue every 5 (five) minutes x 3 doses as needed for chest pain. 06/11/15  Yes [name], MD  omeprazole (PRILOSEC) 20 MG capsule Take 20 mg by mouth every morning.  04/21/11  Yes Historical Provider, MD  oxyCODONE (OXYCONTIN) 15 mg 12 hr tablet Take 1 tablet (15 mg total) by mouth every 12 (twelve) hours. 04/02/16  Yes Tanna Furry, MD  simvastatin (ZOCOR) 40 MG tablet TAKE 1 TABLET (40 MG TOTAL) BY MOUTH EVERY EVENING. 08/29/15  Yes [name], MD  digoxin (LANOXIN) 0.125 MG tablet Take 1 tablet (0.125 mg total) by mouth daily. Patient not taking: Reported on 03/27/2016 01/10/16   [name], MD  doxycycline (VIBRA-TABS) 100 MG tablet Take 1 tablet (100 mg total) by mouth 2 (two) times daily. Patient not taking: Reported on 04/02/2016 03/27/16   Francine Graven, DO  levalbuterol [facility] HFA) 45 MCG/ACT inhaler Inhale 2 puffs into the lungs every 4 (four) hours as needed for wheezing. Patient not taking: Reported on 04/02/2016 03/20/16   [name], DO  predniSONE (DELTASONE) 20 MG tablet Take 2 tablets (40 mg total) by mouth daily. 03/27/16   Francine Graven, DO    Current Facility-Administered Medications  Medication Dose Route Frequency Provider Last Rate Last Dose  . acetaminophen (TYLENOL) tablet 650 mg  650 mg Oral Q6H PRN Rise Patience, MD       Or  . acetaminophen (TYLENOL) suppository 650 mg  650 mg Rectal Q6H PRN Rise Patience, MD      . ALPRAZolam [name]) tablet 0.25 mg  0.25 mg Oral QHS PRN Rise Patience, MD   0.25 mg at 04/09/16 2052  . apixaban (ELIQUIS) tablet 5 mg  5 mg  Oral BID Verlee Monte, MD   5 mg at 04/10/16 1214  . bisacodyl (DULCOLAX) suppository 10 mg  10 mg Rectal Once Robbie Lis, MD   10 mg at 04/10/16 0800  . diltiazem (CARDIZEM) 100 mg in dextrose 5 % 100 mL (1 mg/mL) infusion  5-15 mg/hr Intravenous Titrated Verlee Monte, MD   Stopped at 04/10/16 0435  . furosemide (LASIX) injection 40 mg  40 mg Intravenous BID Verlee Monte, MD   40 mg at 04/10/16 0744  . guaiFENesin (ROBITUSSIN) 100 MG/5ML solution 100 mg  5 mL Oral Q6H PRN Robbie Lis, MD   100 mg at 04/09/16 1352  . insulin aspart (novoLOG) injection 0-9 Units  0-9 Units  Subcutaneous TID WC Rise Patience, MD   2 Units at 04/10/16 1214  . ipratropium (ATROVENT) nebulizer solution 0.5 mg  0.5 mg Nebulization Q6H Rise Patience, MD   0.5 mg at 04/10/16 1421  . ipratropium (ATROVENT) nebulizer solution 0.5 mg  0.5 mg Nebulization Q2H PRN Robbie Lis, MD   0.5 mg at 04/10/16 1120  . levalbuterol (XOPENEX) nebulizer solution 0.63 mg  0.63 mg Nebulization Q6H Rise Patience, MD   0.63 mg at 04/10/16 1421  . levalbuterol (XOPENEX) nebulizer solution 0.63 mg  0.63 mg Nebulization Q2H PRN Robbie Lis, MD   0.63 mg at 04/10/16 1120  . methylPREDNISolone sodium succinate (SOLU-MEDROL) 40 mg/mL injection 40 mg  40 mg Intravenous Daily Rise Patience, MD   40 mg at 04/10/16 1046  . metoprolol tartrate (LOPRESSOR) tablet 50 mg  50 mg Oral BID Rise Patience, MD   50 mg at 04/10/16 1047  . morphine 2 MG/ML injection 1 mg  1 mg Intravenous Q3H PRN Rise Patience, MD   1 mg at 04/09/16 2054  . nitroGLYCERIN (NITROSTAT) SL tablet 0.4 mg  0.4 mg Sublingual Q5 Min x 3 PRN Rise Patience, MD      . ondansetron [facility]) tablet 4 mg  4 mg Oral Q6H PRN Rise Patience, MD       Or  . ondansetron [facility]) injection 4 mg  4 mg Intravenous Q6H PRN Rise Patience, MD      . oxyCODONE (OXYCONTIN) 12 hr tablet 15 mg  15 mg Oral Q12H Rise Patience, MD   15 mg at 04/10/16  1048  . pantoprazole (PROTONIX) EC tablet 40 mg  40 mg Oral Daily Rise Patience, MD   40 mg at 04/10/16 1048  . piperacillin-tazobactam (ZOSYN) IVPB 3.375 g  3.375 g Intravenous Q8H Minda Ditto, RPH   3.375 g at 04/10/16 0551  . polyethylene glycol (MIRALAX / GLYCOLAX) packet 17 g  17 g Oral Daily Verlee Monte, MD   17 g at 04/10/16 1048  . potassium chloride 10 mEq in 100 mL IVPB  10 mEq Intravenous Q1 Hr x 3 Robbie Lis, MD   10 mEq at 04/10/16 1425  . simvastatin (ZOCOR) tablet 40 mg  40 mg Oral q1800 Rise Patience, MD   40 mg at 04/09/16 1718    Allergies as of 04/06/2016 - Review Complete 04/06/2016  Allergen Reaction Noted  . Levaquin [levofloxacin in d5w] Other (See Comments) 09/19/2015    Family History  Problem Relation Age of Onset  . Cancer Father     prostate  . Heart attack Father     Social History   Social History  . Marital Status: Married    Spouse Name: N/A  . Number of Children: N/A  . Years of Education: N/A   Occupational History  . Not on file.   Social History Main Topics  . Smoking status: Former Smoker -- 2.00 packs/day for 40 years    Types: Cigarettes    Quit date: 01/11/2010  . Smokeless tobacco: Never Used  . Alcohol Use: No  . Drug Use: No  . Sexual Activity: Not on file   Other Topics Concern  . Not on file   Social History Narrative   Married, retired.     Review of Systems: Positive = bold Gen: Denies any fever, chills, rigors, night sweats, anorexia, fatigue, weakness, malaise, involuntary weight loss, and sleep disorder CV:  Denies chest pain, angina, palpitations, syncope, orthopnea, PND, peripheral edema, and claudication. Resp: Denies dyspnea, cough, sputum, wheezing, coughing up blood. GI: Described in detail in HPI.    GU : Denies urinary burning, blood in urine, urinary frequency, urinary hesitancy, nocturnal urination, and urinary incontinence. MS: Denies joint pain or swelling.  Denies muscle weakness,  cramps, atrophy.  Derm: Denies rash, itching, oral ulcerations, hives, unhealing ulcers.  Psych: Denies depression, anxiety, memory loss, suicidal ideation, hallucinations,  and confusion. Heme: Denies bruising, bleeding, and enlarged lymph nodes. Neuro:  Denies any headaches, dizziness, paresthesias. Endo:  Denies any problems with DM, thyroid, adrenal function.  Physical Exam: Vital signs in last 24 hours: Temp:  [97.2 F (36.2 C)-97.7 F (36.5 C)] 97.5 F (36.4 C) (06/01 0800) Pulse Rate:  [64-98] 82 (06/01 1400) Resp:  [4-14] 7 (06/01 1400) BP: (90-158)/(64-99) 117/84 mmHg (06/01 1400) SpO2:  [96 %-99 %] 96 % (06/01 1400) Weight:  [67.2 kg (148 lb 2.4 oz)] 67.2 kg (148 lb 2.4 oz) (06/01 0434) Last BM Date: 04/09/16 General:   Alert,  Moderate respiratory distress at rest, continued ongoing productive coughing, older- and more frail-appearing than stated age Head:  Normocephalic and atraumatic. Eyes:  Sclera clear, no icterus.   Conjunctiva pink. Ears:  Normal auditory acuity. Nose:  No deformity, discharge,  or lesions. Mouth:  No deformity or lesions.  Oropharynx pink but dry Neck:  Supple; no masses or thyromegaly. Lungs:  Diminished breath sounds entire right lung field; wet rales left lower fields; diffuse post-expiratory wheezes;  Moderate respiratory distress Heart:  Tachycardic, otherwise regular rate and rhythm; no murmurs, clicks, rubs,  or gallops. Abdomen:  Soft, diffuse distention with tympany; mild left-sided tenderness; scant bowel sounds. No masses, hepatosplenomegaly or hernias noted. Normal bowel sounds, without guarding, and without rebound.     Msk:  Gynecomastia; musculature atrophic but symmetrical without gross deformities. Normal posture. Pulses:  Normal pulses noted. Extremities:  Without clubbing or edema. Neurologic:  Alert and  oriented x4; diffusely weak, but otherwise grossly normal neurologically. Skin:  Scattered ecchymoses, otherwise intact without  significant lesions or rashes. Psych:  Alert and cooperative. Normal mood and affect.   Lab Results:  Recent Labs  04/08/16 0524 04/10/16 0311  WBC 18.0* 14.7*  HGB 10.4* 10.5*  HCT 33.1* 33.5*  PLT 184 204   BMET  Recent Labs  04/08/16 0524 04/10/16 0311  NA 141 138  K 4.0 3.4*  CL 99* 90*  CO2 31 37*  GLUCOSE 135* 177*  BUN 17 17  CREATININE 0.66 0.62  CALCIUM 7.8* 8.4*   LFT No results for input(s): PROT, ALBUMIN, AST, ALT, ALKPHOS, BILITOT, BILIDIR, IBILI in the last 72 hours. PT/INR No results for input(s): LABPROT, INR in the last 72 hours.  Studies/Results: Dg Chest Port 1 View  04/10/2016  CLINICAL DATA:  Shortness of Breath EXAM: PORTABLE CHEST 1 VIEW COMPARISON:  04/07/2016 FINDINGS: Cardiac shadow is stable. The lungs are well aerated bilaterally. No focal infiltrate or sizable effusion is seen. No acute bony abnormality is noted. IMPRESSION: No acute abnormality noted. Electronically Signed   By: Inez Catalina M.D.   On: 04/10/2016 08:09   Dg Abd Portable 1v  04/10/2016  CLINICAL DATA:  Abdominal distension. EXAM: PORTABLE ABDOMEN - 1 VIEW COMPARISON:  CT, 04/07/2016 FINDINGS: There is increased gas in both the colon and small bowel with no significant bowel dilation. Some air is seen within the rectum. There are multiple surgical vascular clips along the abdominal  midline. Vascular calcifications are noted along the aorta and iliac vessels. IMPRESSION: 1. No convincing bowel obstruction. 2. Increased gas noted within the small bowel and colon suggesting a mild diffuse adynamic ileus. Electronically Signed   By: Lajean Manes M.D.   On: 04/10/2016 14:13   Impression:  1.  Abdominal pain, left upper quadrant.  Likely from either ischemic colitis or diverticulitis. 2.  Abdominal distention.  Likely ileus.  Neither CT couple days ago or xray today suggested obstruction. 3.  Blood in stool, mild.  Likely from either ischemic colitis or anorectal source (e.g.,  hemorrhoids). 4.  Abnormal CT scan abdomen/pelvis, left upper quadrant thickening and soft tissue stranding.  Ischemic colitis versus diverticulitis. 5.  New onset pneumonia, on top of COPD. 6.  Multiple other medical problems, including peripheral vascular disease with aortobifemoral bypass surgery.  Plan:  1.  IV fluids. 2.  Antibiotics for pneumonia and possible diverticulitis. 3.  Clear liquids for now. 4.  Miralax and dulcolax suppositories for abdominal distention, constipation, suspected ileus. 5.  Correct hypokalemia. 6.  Minimize use of narcotics. 7.  No plans for sigmoidoscopy to assess left upper quadrant thickening, given (A) patient's profound respiratory distress and (B) possibility of diverticulitis (for which imminent instrumentation with sigmoidoscopy is relatively contraindicated). 8.  [facility] will follow.   LOS: 4 days   Makai Agostinelli M  04/10/2016, 2:50 PM  Pager [phone] If no answer or after 5 PM call [phone]

## 2016-04-11 ENCOUNTER — Inpatient Hospital Stay (HOSPITAL_COMMUNITY): Payer: Medicare Other

## 2016-04-11 DIAGNOSIS — I480 Paroxysmal atrial fibrillation: Secondary | ICD-10-CM

## 2016-04-11 DIAGNOSIS — J189 Pneumonia, unspecified organism: Secondary | ICD-10-CM

## 2016-04-11 DIAGNOSIS — A419 Sepsis, unspecified organism: Principal | ICD-10-CM

## 2016-04-11 DIAGNOSIS — Z95828 Presence of other vascular implants and grafts: Secondary | ICD-10-CM

## 2016-04-11 DIAGNOSIS — J441 Chronic obstructive pulmonary disease with (acute) exacerbation: Secondary | ICD-10-CM

## 2016-04-11 LAB — CBC
HCT: 35.7 % — ABNORMAL LOW (ref 39.0–52.0)
HEMOGLOBIN: 11.7 g/dL — AB (ref 13.0–17.0)
MCH: 28.6 pg (ref 26.0–34.0)
MCHC: 32.8 g/dL (ref 30.0–36.0)
MCV: 87.3 fL (ref 78.0–100.0)
PLATELETS: 228 10*3/uL (ref 150–400)
RBC: 4.09 MIL/uL — ABNORMAL LOW (ref 4.22–5.81)
RDW: 14.2 % (ref 11.5–15.5)
WBC: 12.8 10*3/uL — ABNORMAL HIGH (ref 4.0–10.5)

## 2016-04-11 LAB — BASIC METABOLIC PANEL
Anion gap: 10 (ref 5–15)
BUN: 17 mg/dL (ref 6–20)
CALCIUM: 8.6 mg/dL — AB (ref 8.9–10.3)
CO2: 38 mmol/L — ABNORMAL HIGH (ref 22–32)
CREATININE: 0.62 mg/dL (ref 0.61–1.24)
Chloride: 90 mmol/L — ABNORMAL LOW (ref 101–111)
Glucose, Bld: 148 mg/dL — ABNORMAL HIGH (ref 65–99)
Potassium: 3.8 mmol/L (ref 3.5–5.1)
SODIUM: 138 mmol/L (ref 135–145)

## 2016-04-11 LAB — GLUCOSE, CAPILLARY
GLUCOSE-CAPILLARY: 151 mg/dL — AB (ref 65–99)
Glucose-Capillary: 147 mg/dL — ABNORMAL HIGH (ref 65–99)
Glucose-Capillary: 170 mg/dL — ABNORMAL HIGH (ref 65–99)

## 2016-04-11 MED ORDER — ALPRAZOLAM 0.25 MG PO TABS
0.2500 mg | ORAL_TABLET | Freq: Three times a day (TID) | ORAL | Status: DC | PRN
  Administered 2016-04-11 – 2016-04-13 (×6): 0.25 mg via ORAL
  Filled 2016-04-11 (×7): qty 1

## 2016-04-11 MED ORDER — POTASSIUM CHLORIDE CRYS ER 20 MEQ PO TBCR
40.0000 meq | EXTENDED_RELEASE_TABLET | Freq: Two times a day (BID) | ORAL | Status: AC
  Administered 2016-04-11 (×2): 40 meq via ORAL
  Filled 2016-04-11 (×2): qty 2

## 2016-04-11 MED ORDER — DILTIAZEM HCL 25 MG/5ML IV SOLN
10.0000 mg | INTRAVENOUS | Status: AC
  Administered 2016-04-11: 10 mg via INTRAVENOUS
  Filled 2016-04-11: qty 5

## 2016-04-11 MED ORDER — METHYLPREDNISOLONE SODIUM SUCC 40 MG IJ SOLR
40.0000 mg | Freq: Two times a day (BID) | INTRAMUSCULAR | Status: DC
  Administered 2016-04-11 – 2016-04-12 (×3): 40 mg via INTRAVENOUS
  Filled 2016-04-11 (×2): qty 1

## 2016-04-11 NOTE — Progress Notes (Signed)
Patient remains non compliant with wearing BIPAP. RT tried to talk with patient about the benefits but still refuses.

## 2016-04-11 NOTE — Progress Notes (Signed)
   04/11/16 1055  Vitals  BP (!) 164/92 mmHg  MAP (mmHg) 113  Pulse Rate (!) 175 (MD notified, orders received)  ECG Heart Rate (!) 179  Resp 17  Oxygen Therapy  SpO2 99 %  MD made aware of HR. Orders received. Hr returned to 80s-90s. BP stable. Will continue to monitor.

## 2016-04-11 NOTE — Progress Notes (Signed)
Patient ID: Kenneth Merritt, male   DOB: 1951/06/13, 65 y.o.   MRN: [id number]  PROGRESS NOTE    Kenneth Merritt  [id number] DOB: 14-Aug-1951 DOA: 04/06/2016  PCP: Neale Burly, MD  Brief Narrative:   65 year old male with history of COPD and PAF on anticoagulation with Eliquis, hypertension, dyslipidemia who presented to [facility] ED with altered metnal status and respiratory distress with hypoxia requiring BIPAP. CXR showed congestion and possible pneumonia, CT of abdomen and pelvis showed colitis.  Patient was in more respiratory distress 04/09/2016, requiring Ventimask.  Resp status improving still fluctuating periodically   Assessment & Plan:  Sepsis secondary to Diverticulitis/colitis doubt pneumonia - Sepsis present on admission with tachycardia, tachypnea, hypoxia, leukocytosis. Procalcitonin was 2.9 and lactic acid was 4.55. - Patient initially on vancomycin and Zosyn. Since blood cultures so far show no growth, stopped vancomycin 04/09/2016 -continue Zosyn for now  Acute on chronic diastolic CHF - Pulmonary congestion on x-ray, now improving - continue IV lasix - Weight in past 96 hours: 66.4 kg --> 68.2 kg --> 69.1 kg --> 67.2 kg  -possibly change to PO tomorrow  Acute respiratory failure with hypoxia / CHF / possible COPD exacerbation  - repeat CXR today - continue lasix, steroids, nebs -uses 3L home O2 at baseline for COPD  Steroid-induced hyperglycemia / diabetes mellitus without complications without long-term insulin use - Continue sliding scale insulin - on metformin at home  Paroxysmal atrial fibrillation - CHADS vasc score is 4 - Continue Eliquis for [facility] - rate controlled with metoprolol. He was on Cardizem drip for episodes of A. fib with RVR but currently rate controlled without Cardizem drip. Cardizem drip off at 4:00 6/1 -continue metoprolol  Essential hypertension - Continue metoprolol 50 mg twice daily   Colitis vs Diverticulitis -abd quite tense and  distended but had 2BMs yesterday -continue Abx, clears, stopped IVF due to fluid overload -appreciate GI input  Elevated troponin - Likely demand ischemia from hypoxic respiratory failure - No chest pain    DVT prophylaxis: On full dose anticoagulation with Eliquis Code Status: Full Code Family Communication: Family not at the bedside Disposition Plan: Monitor in step down unit for at least next 24 hours   Consultants:   Gastroenterology   Procedures:   2 D ECHO 03/2016 - -EF 55% and garde 1 DD  LE doppler 5/29/201 - no evidence of DVT  Antimicrobials:   Zosyn 04/06/2016 -->  Vanco stopped 04/09/2016    Subjective: No overnight events. No respiratory distress.  Objective: Filed Vitals:   04/11/16 0600 04/11/16 0700 04/11/16 0736 04/11/16 0800  BP: 140/74   137/93  Pulse: 94 90  106  Temp:    98.3 F (36.8 C)  TempSrc:    Oral  Resp: 10 7  17   Height:      Weight:      SpO2: 99% 99% 100% 99%    Intake/Output Summary (Last 24 hours) at 04/11/16 1028 Last data filed at 04/11/16 0800  Gross per 24 hour  Intake   1610 ml  Output   3400 ml  Net  -1790 ml   Filed Weights   04/09/16 0500 04/10/16 0434 04/11/16 0500  Weight: 69.1 kg (152 lb 5.4 oz) 67.2 kg (148 lb 2.4 oz) 63.9 kg (140 lb 14 oz)    Examination:  General exam: more alert this am, on [place] oxygen  Respiratory system: wheezing in upper lung lobes, no rhonchi  Cardiovascular system: tachcyardic, S1-S2 appreciated Gastrointestinal system:  Appreciate bowel sounds, distended abdomen but not tender Central nervous system: More alert, no focal deficits Extremities: Symmetric extremities, trace lower extremity pitting edema Skin: Skin is warm and dry Psychiatry: No restlessness, normal mood  Data Reviewed: I have personally reviewed following labs and imaging studies  CBC:  Recent Labs Lab 04/06/16 1900 04/07/16 0444 04/08/16 0524 04/10/16 0311 04/11/16 0328  WBC 21.1* 17.8* 18.0* 14.7*  12.8*  NEUTROABS 19.5* 16.9*  --   --   --   HGB 12.3* 11.3* 10.4* 10.5* 11.7*  HCT 39.8 35.4* 33.1* 33.5* 35.7*  MCV 90.9 90.5 90.7 90.1 87.3  PLT 198 180 184 204 [id number]   Basic Metabolic Panel:  Recent Labs Lab 04/06/16 1900 04/07/16 0444 04/08/16 0524 04/10/16 0311 04/11/16 0328  NA 136 138 141 138 138  K 3.9 4.8 4.0 3.4* 3.8  CL 95* 102 99* 90* 90*  CO2 26 26 31  37* 38*  GLUCOSE 160* 202* 135* 177* 148*  BUN 26* 14 17 17 17   CREATININE 1.14 0.77 0.66 0.62 0.62  CALCIUM 8.6* 7.6* 7.8* 8.4* 8.6*   GFR: Estimated Creatinine Clearance: 78.1 mL/min (by C-G formula based on Cr of 0.62). Liver Function Tests:  Recent Labs Lab 04/06/16 1900 04/07/16 0444  AST 77* 84*  ALT 39 38  ALKPHOS 53 46  BILITOT 2.2* 1.8*  PROT 6.5 5.6*  ALBUMIN 3.4* 2.9*   No results for input(s): LIPASE, AMYLASE in the last 168 hours.  Recent Labs Lab 04/06/16 2238  AMMONIA 12   Coagulation Profile:  Recent Labs Lab 04/06/16 2238  INR 1.39   Cardiac Enzymes:  Recent Labs Lab 04/06/16 2238 04/07/16 0444 04/07/16 1015  TROPONINI 0.18* 0.12* 0.11*   BNP (last 3 results) No results for input(s): PROBNP in the last 8760 hours. HbA1C: No results for input(s): HGBA1C in the last 72 hours. CBG:  Recent Labs Lab 04/09/16 2113 04/10/16 0727 04/10/16 1155 04/10/16 1555 04/10/16 2128  GLUCAP 172* 165* 151* 174* 159*   Lipid Profile: No results for input(s): CHOL, HDL, LDLCALC, TRIG, CHOLHDL, LDLDIRECT in the last 72 hours. Thyroid Function Tests: No results for input(s): TSH, T4TOTAL, FREET4, T3FREE, THYROIDAB in the last 72 hours. Anemia Panel: No results for input(s): VITAMINB12, FOLATE, FERRITIN, TIBC, IRON, RETICCTPCT in the last 72 hours. Urine analysis:    Component Value Date/Time   COLORURINE AMBER* 04/06/2016 1942   APPEARANCEUR CLOUDY* 04/06/2016 1942   LABSPEC 1.021 04/06/2016 1942   PHURINE 5.0 04/06/2016 1942   GLUCOSEU 250* 04/06/2016 1942   HGBUR  MODERATE* 04/06/2016 1942   BILIRUBINUR MODERATE* 04/06/2016 1942   KETONESUR 40* 04/06/2016 1942   PROTEINUR NEGATIVE 04/06/2016 1942   UROBILINOGEN 0.2 01/21/2010 1944   NITRITE NEGATIVE 04/06/2016 1942   LEUKOCYTESUR NEGATIVE 04/06/2016 1942   Sepsis Labs: @LABRCNTIP (procalcitonin:4,lacticidven:4)   Blood Culture (routine x 2)     Status: None (Preliminary result)   Collection Time: 04/06/16  6:40 PM  Result Value Ref Range Status   Specimen Description BLOOD RIGHT ANTECUBITAL  Final   Special Requests BOTTLES DRAWN AEROBIC AND ANAEROBIC 5CC EA  Final   Culture   Final    NO GROWTH 1 DAY Performed at [facility]    Report Status PENDING  Incomplete  Blood Culture (routine x 2)     Status: None (Preliminary result)   Collection Time: 04/06/16  7:00 PM  Result Value Ref Range Status   Specimen Description BLOOD LEFT WRIST  Final   Special Requests IN PEDIATRIC  BOTTLE 5CC  Final   Culture   Final    NO GROWTH 1 DAY Performed at [facility]    Report Status PENDING  Incomplete  Urine culture     Status: None   Collection Time: 04/06/16  7:42 PM  Result Value Ref Range Status   Specimen Description URINE, CATHETERIZED  Final   Special Requests NONE  Final   Culture NO GROWTH   Final   Report Status 04/08/2016 FINAL  Final  MRSA PCR Screening     Status: None   Collection Time: 04/06/16 10:21 PM  Result Value Ref Range Status   MRSA by PCR NEGATIVE NEGATIVE Final      Radiology Studies:  Dg Chest Port 1 View 04/10/2016  No acute abnormality noted. Electronically Signed   By: Inez Catalina M.D.   On: 04/10/2016 08:09   Ct Abdomen Pelvis W Contrast 04/07/2016 1. Soft tissue stranding in the left pericolic gutter. There short segment colonic wall thickening with possible small diverticulum at the junction of the descending and sigmoid colon a region of fat stranding. Differential considerations include mild acute diverticulitis, however no significant  diverticular burden is seen elsewhere. Focal colitis is considered, with distribution suggesting ischemic etiology. 2. 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 3. Lingular consolidation is new from chest CT eleven days prior. Additional right lower lobe tree in bud opacities. Findings suspicious for infectious etiology/ pneumonia and bronchiolitis.   Dg Chest Port 1 View 04/07/2016 Decreasing pulmonary vascular congestion. Lingular opacity on CT is not well seen. Minimal patchy left basilar opacity, with mild improvement from prior radiograph.   Dg Chest Portable 1 View 04/06/2016  Vascular congestion noted. Mild left basilar opacity could reflect pneumonia. Mild right midlung and right apical scarring noted.    Scheduled Meds: . apixaban  5 mg Oral BID  . bisacodyl  10 mg Rectal BID  . furosemide  40 mg Intravenous BID  . insulin aspart  0-9 Units Subcutaneous TID WC  . ipratropium  0.5 mg Nebulization Q6H  . levalbuterol  0.63 mg Nebulization Q6H  . methylPREDNISolone (SOLU-MEDROL) injection  40 mg Intravenous Q12H  . metoprolol  50 mg Oral BID  . oxyCODONE  15 mg Oral Q12H  . pantoprazole  40 mg Oral Daily  . piperacillin-tazobactam (ZOSYN)  IV  3.375 g Intravenous Q8H  . polyethylene glycol  17 g Oral BID  . potassium chloride  40 mEq Oral BID  . simvastatin  40 mg Oral q1800   Continuous Infusions:     LOS: 5 days    Time spent: 25 minutes  Greater than 50% of the time spent on counseling and coordinating the care.   Domenic Polite, MD [facility] Pager [phone]  If 7PM-7AM, please contact night-coverage [email or web address] Password [email or web address] 04/11/2016, 10:28 AM

## 2016-04-11 NOTE — Progress Notes (Signed)
Subjective: Abdominal pain improving. Abdominal distention improving; having some bowel movements.  Objective: Vital signs in last 24 hours: Temp:  [97.4 F (36.3 C)-98.3 F (36.8 C)] 98.1 F (36.7 C) (06/02 1200) Pulse Rate:  [80-175] 82 (06/02 1300) Resp:  [5-18] 8 (06/02 1300) BP: (93-186)/(56-98) 97/66 mmHg (06/02 1200) SpO2:  [93 %-100 %] 99 % (06/02 1300) Weight:  [63.9 kg (140 lb 14 oz)] 63.9 kg (140 lb 14 oz) (06/02 0500) Weight change: -3.3 kg (-7 lb 4.4 oz) Last BM Date: 04/10/16  PE: GEN:  Tachypneic, chronically ill-appearing, receiving nebulizer now ABD:  Distended (but improved), non-tender, hypoactive but present bowel sounds  Lab Results: CBC    Component Value Date/Time   WBC 12.8* 04/11/2016 0328   RBC 4.09* 04/11/2016 0328   HGB 11.7* 04/11/2016 0328   HCT 35.7* 04/11/2016 0328   PLT 228 04/11/2016 0328   MCV 87.3 04/11/2016 0328   MCH 28.6 04/11/2016 0328   MCHC 32.8 04/11/2016 0328   RDW 14.2 04/11/2016 0328   LYMPHSABS 0.1* 04/07/2016 0444   MONOABS 0.7 04/07/2016 0444   EOSABS 0.0 04/07/2016 0444   BASOSABS 0.0 04/07/2016 0444   CMP     Component Value Date/Time   NA 138 04/11/2016 0328   K 3.8 04/11/2016 0328   CL 90* 04/11/2016 0328   CO2 38* 04/11/2016 0328   GLUCOSE 148* 04/11/2016 0328   BUN 17 04/11/2016 0328   CREATININE 0.62 04/11/2016 0328   CALCIUM 8.6* 04/11/2016 0328   PROT 5.6* 04/07/2016 0444   ALBUMIN 2.9* 04/07/2016 0444   AST 84* 04/07/2016 0444   ALT 38 04/07/2016 0444   ALKPHOS 46 04/07/2016 0444   BILITOT 1.8* 04/07/2016 0444   GFRNONAA >60 04/11/2016 0328   GFRAA >60 04/11/2016 0328   Assessment:  1. Abdominal pain, left upper quadrant. Likely from either ischemic colitis or diverticulitis. 2. Abdominal distention. Likely ileus. Neither CT couple days ago or xray yesterday suggested obstruction. Is improving. 3. Blood in stool, mild. Likely from either ischemic colitis or anorectal source (e.g.,  hemorrhoids). 4. Abnormal CT scan abdomen/pelvis, left upper quadrant thickening and soft tissue stranding. Ischemic colitis versus diverticulitis. 5. New onset pneumonia, on top of COPD. 6. Multiple other medical problems, including peripheral vascular disease with aortobifemoral bypass surgery.  Plan:  1.  Continued Miralax and dulcolax suppositories; if we don't continue making significant headway, might need to consider slow bowel prep as fecal purgative. 2.  Antibiotics for pneumonia and possible diverticulitis. 3.  Watch potassium. 4.  Limit use of narcotics. 5.  [facility] will follow.   Landry Dyke 04/11/2016, 3:04 PM   Pager [phone] If no answer or after 5 PM call [phone]

## 2016-04-11 NOTE — Progress Notes (Signed)
Pharmacy Antibiotic Note  Kenneth Merritt is a 65 y.o. male admitted on 04/06/2016 with sepsis 2/2 possible HCAP.  Also, treating possible diverticulitis.  Currently on Zosyn per pharmacy dosing.  Today is day #6 of Zosyn therapy.  WBC improving, on steroids, CXR clear 6/1 and 6/2  Plan: Continue Zosyn 3.375g IV q8h (4 hour infusion time).   Height: 5\' 4"  (162.6 cm) Weight: 140 lb 14 oz (63.9 kg) IBW/kg (Calculated) : 59.2  Temp (24hrs), Avg:97.7 F (36.5 C), Min:97.4 F (36.3 C), Max:98.3 F (36.8 C)   Recent Labs Lab 04/06/16 1900 04/06/16 1909 04/06/16 2238 04/07/16 0143 04/07/16 0444 04/08/16 0524 04/10/16 0311 04/11/16 0328  WBC 21.1*  --   --   --  17.8* 18.0* 14.7* 12.8*  CREATININE 1.14  --   --   --  0.77 0.66 0.62 0.62  LATICACIDVEN  --  4.55* 3.8* 2.2*  --  1.6  --   --     Estimated Creatinine Clearance: 78.1 mL/min (by C-G formula based on Cr of 0.62).    Allergies  Allergen Reactions  . Levaquin [Levofloxacin In D5w] Other (See Comments)    Pt states it caused dry mouth and rectal bleeding.     Antimicrobials this admission: 5/28 Vanc >> 5/31 5/28 Zosyn >>  Dose adjustments this admission:  Microbiology results: 5/28 MRSA PCR: negative 5/28 BCx: ngtd 5/28 UCx: NGF  Thank you for allowing pharmacy to be a part of this patient's care.  Hershal Coria 04/11/2016 9:07 AM

## 2016-04-12 DIAGNOSIS — I25110 Atherosclerotic heart disease of native coronary artery with unstable angina pectoris: Secondary | ICD-10-CM

## 2016-04-12 DIAGNOSIS — R7989 Other specified abnormal findings of blood chemistry: Secondary | ICD-10-CM

## 2016-04-12 LAB — CBC
HCT: 33.3 % — ABNORMAL LOW (ref 39.0–52.0)
HEMOGLOBIN: 10.7 g/dL — AB (ref 13.0–17.0)
MCH: 28.1 pg (ref 26.0–34.0)
MCHC: 32.1 g/dL (ref 30.0–36.0)
MCV: 87.4 fL (ref 78.0–100.0)
Platelets: 218 10*3/uL (ref 150–400)
RBC: 3.81 MIL/uL — ABNORMAL LOW (ref 4.22–5.81)
RDW: 14.3 % (ref 11.5–15.5)
WBC: 9.4 10*3/uL (ref 4.0–10.5)

## 2016-04-12 LAB — CULTURE, BLOOD (ROUTINE X 2)
CULTURE: NO GROWTH
Culture: NO GROWTH

## 2016-04-12 LAB — BASIC METABOLIC PANEL
Anion gap: 10 (ref 5–15)
BUN: 17 mg/dL (ref 6–20)
CHLORIDE: 91 mmol/L — AB (ref 101–111)
CO2: 37 mmol/L — AB (ref 22–32)
CREATININE: 0.62 mg/dL (ref 0.61–1.24)
Calcium: 8.2 mg/dL — ABNORMAL LOW (ref 8.9–10.3)
GFR calc Af Amer: >60 mL/min (ref >60)
GFR calc non Af Amer: >60 mL/min (ref >60)
GLUCOSE: 186 mg/dL — AB (ref 65–99)
Potassium: 3.4 mmol/L — ABNORMAL LOW (ref 3.5–5.1)
SODIUM: 138 mmol/L (ref 135–145)

## 2016-04-12 LAB — GLUCOSE, CAPILLARY
GLUCOSE-CAPILLARY: 153 mg/dL — AB (ref 65–99)
GLUCOSE-CAPILLARY: 117 mg/dL — AB (ref 65–99)
GLUCOSE-CAPILLARY: 153 mg/dL — AB (ref 65–99)
Glucose-Capillary: 153 mg/dL — ABNORMAL HIGH (ref 65–99)

## 2016-04-12 LAB — MAGNESIUM: MAGNESIUM: 2.2 mg/dL (ref 1.7–2.4)

## 2016-04-12 MED ORDER — FUROSEMIDE 20 MG PO TABS
20.0000 mg | ORAL_TABLET | Freq: Two times a day (BID) | ORAL | Status: AC
  Administered 2016-04-12 (×2): 20 mg via ORAL
  Filled 2016-04-12 (×2): qty 1

## 2016-04-12 MED ORDER — PREDNISONE 50 MG PO TABS
50.0000 mg | ORAL_TABLET | Freq: Every day | ORAL | Status: DC
  Administered 2016-04-13 – 2016-04-14 (×2): 50 mg via ORAL
  Filled 2016-04-12 (×2): qty 1

## 2016-04-12 MED ORDER — POTASSIUM CHLORIDE 10 MEQ/100ML IV SOLN
10.0000 meq | Freq: Once | INTRAVENOUS | Status: AC
  Administered 2016-04-12: 10 meq via INTRAVENOUS
  Filled 2016-04-12: qty 100

## 2016-04-12 MED ORDER — IPRATROPIUM BROMIDE 0.02 % IN SOLN: 0.5000 mg | Freq: Four times a day (QID) | RESPIRATORY_TRACT | Status: DC | PRN

## 2016-04-12 MED ORDER — FUROSEMIDE 10 MG/ML IJ SOLN: 20.0000 mg | Freq: Two times a day (BID) | INTRAMUSCULAR | Status: DC

## 2016-04-12 MED ORDER — POTASSIUM CHLORIDE CRYS ER 20 MEQ PO TBCR
40.0000 meq | EXTENDED_RELEASE_TABLET | Freq: Two times a day (BID) | ORAL | Status: AC
  Administered 2016-04-12: 40 meq via ORAL
  Filled 2016-04-12 (×3): qty 2

## 2016-04-12 MED ORDER — METOPROLOL TARTRATE 5 MG/5ML IV SOLN
2.5000 mg | Freq: Once | INTRAVENOUS | Status: AC
  Administered 2016-04-12: 2.5 mg via INTRAVENOUS
  Filled 2016-04-12: qty 5

## 2016-04-12 MED ORDER — LEVALBUTEROL HCL 0.63 MG/3ML IN NEBU
0.6300 mg | INHALATION_SOLUTION | Freq: Four times a day (QID) | RESPIRATORY_TRACT | Status: DC | PRN
  Administered 2016-04-13 – 2016-04-15 (×3): 0.63 mg via RESPIRATORY_TRACT
  Filled 2016-04-12 (×3): qty 3

## 2016-04-12 MED ORDER — METHYLPREDNISOLONE SODIUM SUCC 40 MG IJ SOLR
40.0000 mg | Freq: Two times a day (BID) | INTRAMUSCULAR | Status: AC
  Administered 2016-04-12: 40 mg via INTRAVENOUS
  Filled 2016-04-12: qty 1

## 2016-04-12 NOTE — Progress Notes (Addendum)
Patient ID: Kenneth Merritt, male   DOB: 12-31-1950, 65 y.o.   MRN: [id number]  PROGRESS NOTE    Kenneth Merritt  [id number] DOB: 09-Jul-1951 DOA: 04/06/2016  PCP: Neale Burly, MD  Brief Narrative:   65 year old male with history of COPD and PAF on anticoagulation with Eliquis, hypertension, dyslipidemia who presented to [facility] ED with altered metnal status and respiratory distress with hypoxia requiring BIPAP. CXR showed congestion and possible pneumonia, CT of abdomen and pelvis showed colitis.  Patient was in more respiratory distress 04/09/2016, requiring Ventimask.  Resp status improving still fluctuating periodically   Assessment & Plan:  Sepsis secondary to Diverticulitis/colitis doubt pneumonia - Sepsis present on admission with tachycardia, tachypnea, hypoxia, leukocytosis. Procalcitonin was 2.9 and lactic acid was 4.55. - Patient initially on vancomycin and Zosyn. Since blood cultures so far show no growth, stopped vancomycin 04/09/2016 - continue Zosyn for now  Acute on chronic diastolic CHF - Pulmonary congestion on x-ray, now improving - improved with IV lasix, changed to Po lasix today  Acute respiratory failure with hypoxia / CHF / possible COPD exacerbation  - repeat CXR -nremarkable - continue lasix, steroids, nebs -uses 3L home O2 at baseline for COPD, change to Prednisone tomorrow -improved and close to baseline  Steroid-induced hyperglycemia / diabetes mellitus without complications without long-term insulin use - Continue sliding scale insulin - on metformin at home  Paroxysmal atrial fibrillation - CHADS vasc score is 4 - Continue Eliquis for [facility] - rate controlled with metoprolol. He was on Cardizem drip for episodes of A. fib with RVR but currently rate controlled without Cardizem drip. Cardizem drip off at 4:00 6/1 -continue metoprolol, HR improved  Essential hypertension - Continue metoprolol 50 mg twice daily   Colitis vs Diverticulitis -abd less  tense  and distended had 1BM yesterday -continue Zosyn, change to PO tomorrow,  stopped IVF due to fluid overload -appreciate GI input -advance diet to full liquids today  Elevated troponin - Likely demand ischemia from hypoxic respiratory failure - No chest pain   DVT prophylaxis: On full dose anticoagulation with Eliquis Code Status: Full Code Family Communication:No  Family at bedside, called and updated wife Disposition Plan: Tx to floor, home in 1-2days if stable   Consultants:   Gastroenterology   Procedures:   2 D ECHO 03/2016 - -EF 55% and garde 1 DD  LE doppler 5/29/201 - no evidence of DVT  Antimicrobials:   Zosyn 04/06/2016 -->  Vanco stopped 04/09/2016    Subjective: No overnight events. No respiratory distress. Wants to go home, frustrated abt diet too  Objective: Filed Vitals:   04/12/16 0700 04/12/16 0800 04/12/16 0850 04/12/16 0900  BP:  123/58 123/58   Pulse: 82  114 104  Temp:  97.9 F (36.6 C)    TempSrc:  Oral    Resp: 11 14 16 17   Height:      Weight:      SpO2: 98% 98% 100% 98%    Intake/Output Summary (Last 24 hours) at 04/12/16 0952 Last data filed at 04/12/16 0800  Gross per 24 hour  Intake    945 ml  Output   2385 ml  Net  -1440 ml   Filed Weights   04/10/16 0434 04/11/16 0500 04/12/16 0500  Weight: 67.2 kg (148 lb 2.4 oz) 63.9 kg (140 lb 14 oz) 60.6 kg (133 lb 9.6 oz)    Examination:  General exam: more alert this am, on [place] oxygen  Respiratory system: wheezing in  upper lung lobes, no rhonchi  Cardiovascular system: tachcyardic, S1-S2 appreciated Gastrointestinal system: Appreciate bowel sounds, distended abdomen, less tense, not tender Central nervous system: More alert, no focal deficits Extremities: Symmetric extremities, trace lower extremity pitting edema Skin: Skin is warm and dry Psychiatry: No restlessness, normal mood  Data Reviewed: I have personally reviewed following labs and imaging studies  CBC:  Recent  Labs Lab 04/06/16 1900 04/07/16 0444 04/08/16 0524 04/10/16 0311 04/11/16 0328 04/12/16 0051  WBC 21.1* 17.8* 18.0* 14.7* 12.8* 9.4  NEUTROABS 19.5* 16.9*  --   --   --   --   HGB 12.3* 11.3* 10.4* 10.5* 11.7* 10.7*  HCT 39.8 35.4* 33.1* 33.5* 35.7* 33.3*  MCV 90.9 90.5 90.7 90.1 87.3 87.4  PLT 198 180 184 204 228 [id number]   Basic Metabolic Panel:  Recent Labs Lab 04/07/16 0444 04/08/16 0524 04/10/16 0311 04/11/16 0328 04/12/16 0051 04/12/16 0055  NA 138 141 138 138 138  --   K 4.8 4.0 3.4* 3.8 3.4*  --   CL 102 99* 90* 90* 91*  --   CO2 26 31 37* 38* 37*  --   GLUCOSE 202* 135* 177* 148* 186*  --   BUN 14 17 17 17 17   --   CREATININE 0.77 0.66 0.62 0.62 0.62  --   CALCIUM 7.6* 7.8* 8.4* 8.6* 8.2*  --   MG  --   --   --   --   --  2.2   GFR: Estimated Creatinine Clearance: 78.1 mL/min (by C-G formula based on Cr of 0.62). Liver Function Tests:  Recent Labs Lab 04/06/16 1900 04/07/16 0444  AST 77* 84*  ALT 39 38  ALKPHOS 53 46  BILITOT 2.2* 1.8*  PROT 6.5 5.6*  ALBUMIN 3.4* 2.9*   No results for input(s): LIPASE, AMYLASE in the last 168 hours.  Recent Labs Lab 04/06/16 2238  AMMONIA 12   Coagulation Profile:  Recent Labs Lab 04/06/16 2238  INR 1.39   Cardiac Enzymes:  Recent Labs Lab 04/06/16 2238 04/07/16 0444 04/07/16 1015  TROPONINI 0.18* 0.12* 0.11*   BNP (last 3 results) No results for input(s): PROBNP in the last 8760 hours. HbA1C: No results for input(s): HGBA1C in the last 72 hours. CBG:  Recent Labs Lab 04/10/16 2128 04/11/16 1140 04/11/16 1643 04/11/16 2124 04/12/16 0758  GLUCAP 159* 147* 170* 151* 153*   Lipid Profile: No results for input(s): CHOL, HDL, LDLCALC, TRIG, CHOLHDL, LDLDIRECT in the last 72 hours. Thyroid Function Tests: No results for input(s): TSH, T4TOTAL, FREET4, T3FREE, THYROIDAB in the last 72 hours. Anemia Panel: No results for input(s): VITAMINB12, FOLATE, FERRITIN, TIBC, IRON, RETICCTPCT in the last  72 hours. Urine analysis:    Component Value Date/Time   COLORURINE AMBER* 04/06/2016 1942   APPEARANCEUR CLOUDY* 04/06/2016 1942   LABSPEC 1.021 04/06/2016 1942   PHURINE 5.0 04/06/2016 1942   GLUCOSEU 250* 04/06/2016 1942   HGBUR MODERATE* 04/06/2016 1942   BILIRUBINUR MODERATE* 04/06/2016 1942   KETONESUR 40* 04/06/2016 1942   PROTEINUR NEGATIVE 04/06/2016 1942   UROBILINOGEN 0.2 01/21/2010 1944   NITRITE NEGATIVE 04/06/2016 1942   LEUKOCYTESUR NEGATIVE 04/06/2016 1942   Sepsis Labs: @LABRCNTIP (procalcitonin:4,lacticidven:4)   Blood Culture (routine x 2)     Status: None (Preliminary result)   Collection Time: 04/06/16  6:40 PM  Result Value Ref Range Status   Specimen Description BLOOD RIGHT ANTECUBITAL  Final   Special Requests BOTTLES DRAWN AEROBIC AND ANAEROBIC 5CC EA  Final  Culture   Final    NO GROWTH 1 DAY Performed at [facility]    Report Status PENDING  Incomplete  Blood Culture (routine x 2)     Status: None (Preliminary result)   Collection Time: 04/06/16  7:00 PM  Result Value Ref Range Status   Specimen Description BLOOD LEFT WRIST  Final   Special Requests IN PEDIATRIC BOTTLE 5CC  Final   Culture   Final    NO GROWTH 1 DAY Performed at [facility]    Report Status PENDING  Incomplete  Urine culture     Status: None   Collection Time: 04/06/16  7:42 PM  Result Value Ref Range Status   Specimen Description URINE, CATHETERIZED  Final   Special Requests NONE  Final   Culture NO GROWTH   Final   Report Status 04/08/2016 FINAL  Final  MRSA PCR Screening     Status: None   Collection Time: 04/06/16 10:21 PM  Result Value Ref Range Status   MRSA by PCR NEGATIVE NEGATIVE Final      Radiology Studies:  Dg Chest Port 1 View 04/10/2016  No acute abnormality noted. Electronically Signed   By: Inez Catalina M.D.   On: 04/10/2016 08:09   Ct Abdomen Pelvis W Contrast 04/07/2016 1. Soft tissue stranding in the left pericolic gutter. There  short segment colonic wall thickening with possible small diverticulum at the junction of the descending and sigmoid colon a region of fat stranding. Differential considerations include mild acute diverticulitis, however no significant diverticular burden is seen elsewhere. Focal colitis is considered, with distribution suggesting ischemic etiology. 2. 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 3. Lingular consolidation is new from chest CT eleven days prior. Additional right lower lobe tree in bud opacities. Findings suspicious for infectious etiology/ pneumonia and bronchiolitis.   Dg Chest Port 1 View 04/07/2016 Decreasing pulmonary vascular congestion. Lingular opacity on CT is not well seen. Minimal patchy left basilar opacity, with mild improvement from prior radiograph.   Dg Chest Portable 1 View 04/06/2016  Vascular congestion noted. Mild left basilar opacity could reflect pneumonia. Mild right midlung and right apical scarring noted.    Scheduled Meds: . apixaban  5 mg Oral BID  . bisacodyl  10 mg Rectal BID  . furosemide  20 mg Oral BID  . insulin aspart  0-9 Units Subcutaneous TID WC  . ipratropium  0.5 mg Nebulization Q6H  . levalbuterol  0.63 mg Nebulization Q6H  . methylPREDNISolone (SOLU-MEDROL) injection  40 mg Intravenous Q12H  . metoprolol  50 mg Oral BID  . oxyCODONE  15 mg Oral Q12H  . pantoprazole  40 mg Oral Daily  . piperacillin-tazobactam (ZOSYN)  IV  3.375 g Intravenous Q8H  . polyethylene glycol  17 g Oral BID  . potassium chloride  40 mEq Oral BID  . simvastatin  40 mg Oral q1800   Continuous Infusions:     LOS: 6 days    Time spent: 25 minutes  Greater than 50% of the time spent on counseling and coordinating the care.   Domenic Polite, MD [facility] Pager [phone]  If 7PM-7AM, please contact night-coverage [email or web address] Password [email or web address] 04/12/2016, 9:52 AM

## 2016-04-12 NOTE — Progress Notes (Signed)
Received pt from ICU, report given per Isaias Sakai RN

## 2016-04-12 NOTE — Progress Notes (Signed)
Patient ID: Kenneth Merritt, male   DOB: [birth year], 65 y.o.   MRN: [id number] [facility] Gastroenterology Progress Note  Kenneth Merritt 65 y.o. 05-25-1951   Subjective: Small loose stool overnight. Denies abdominal pain, nausea, or vomiting. Tolerating clear liquid diet. Wants to eat. Sitting on side of bed with family and nurse in room.  Objective: Vital signs in last 24 hours: Filed Vitals:   04/12/16 0900 04/12/16 1000  BP:  91/43  Pulse: 104 97  Temp:    Resp: 17 9   T 97.9  Physical Exam: Gen: lethargic, mild acute distress, well-nourished CV: RRR Chest: Bilateral wheezes and crackles Abd: distended, nontender, decreased bowel sounds Ext: no edema  Lab Results:  Recent Labs  04/11/16 0328 04/12/16 0051 04/12/16 0055  NA 138 138  --   K 3.8 3.4*  --   CL 90* 91*  --   CO2 38* 37*  --   GLUCOSE 148* 186*  --   BUN 17 17  --   CREATININE 0.62 0.62  --   CALCIUM 8.6* 8.2*  --   MG  --   --  2.2   No results for input(s): AST, ALT, ALKPHOS, BILITOT, PROT, ALBUMIN in the last 72 hours.  Recent Labs  04/11/16 0328 04/12/16 0051  WBC 12.8* 9.4  HGB 11.7* 10.7*  HCT 35.7* 33.3*  MCV 87.3 87.4  PLT 228 218   No results for input(s): LABPROT, INR in the last 72 hours.    Assessment/Plan: Abdominal distention concerning for ileus with CT last week showing colonic wall thickening in the distal descending colon and sigmoid colon questionable for diverticulitis. Doubt ischemic colitis. Continue Miralax and supportive care. Agree with full liquids but would not advance beyond that today. Patient states that he is leaving tomorrow and in my opinion from a GI standpoint he would have to leave AMA unless he shows dramatic improvement in the next 24 hours. Encouraged to take suppositories which nurse reports he is refusing. Respiratory status tenuous and defer to primary team. Will follow.   Lewis C. 04/12/2016, 10:24 AM  Pager [phone]  If no answer or after 5  PM call [phone]

## 2016-04-12 NOTE — Progress Notes (Signed)
Patient refuses to take PO potassium pills

## 2016-04-13 ENCOUNTER — Inpatient Hospital Stay (HOSPITAL_COMMUNITY): Payer: Medicare Other

## 2016-04-13 LAB — BASIC METABOLIC PANEL
Anion gap: 8 (ref 5–15)
BUN: 17 mg/dL (ref 6–20)
CHLORIDE: 95 mmol/L — AB (ref 101–111)
CO2: 36 mmol/L — AB (ref 22–32)
CREATININE: 0.64 mg/dL (ref 0.61–1.24)
Calcium: 8.5 mg/dL — ABNORMAL LOW (ref 8.9–10.3)
GFR calc Af Amer: >60 mL/min (ref >60)
GFR calc non Af Amer: >60 mL/min (ref >60)
Glucose, Bld: 165 mg/dL — ABNORMAL HIGH (ref 65–99)
Potassium: 4.5 mmol/L (ref 3.5–5.1)
Sodium: 139 mmol/L (ref 135–145)

## 2016-04-13 LAB — CBC
HEMATOCRIT: 33.6 % — AB (ref 39.0–52.0)
HEMOGLOBIN: 10.7 g/dL — AB (ref 13.0–17.0)
MCH: 27.8 pg (ref 26.0–34.0)
MCHC: 31.8 g/dL (ref 30.0–36.0)
MCV: 87.3 fL (ref 78.0–100.0)
Platelets: 226 10*3/uL (ref 150–400)
RBC: 3.85 MIL/uL — ABNORMAL LOW (ref 4.22–5.81)
RDW: 14.4 % (ref 11.5–15.5)
WBC: 8.7 10*3/uL (ref 4.0–10.5)

## 2016-04-13 LAB — GLUCOSE, CAPILLARY
GLUCOSE-CAPILLARY: 155 mg/dL — AB (ref 65–99)
GLUCOSE-CAPILLARY: 203 mg/dL — AB (ref 65–99)
Glucose-Capillary: 163 mg/dL — ABNORMAL HIGH (ref 65–99)
Glucose-Capillary: 140 mg/dL — ABNORMAL HIGH (ref 65–99)

## 2016-04-13 MED ORDER — FUROSEMIDE 10 MG/ML IJ SOLN
20.0000 mg | Freq: Once | INTRAMUSCULAR | Status: AC
  Administered 2016-04-13: 20 mg via INTRAVENOUS
  Filled 2016-04-13: qty 2

## 2016-04-13 MED ORDER — ALPRAZOLAM 0.25 MG PO TABS
0.2500 mg | ORAL_TABLET | Freq: Four times a day (QID) | ORAL | Status: DC | PRN
  Administered 2016-04-13 – 2016-04-14 (×5): 0.25 mg via ORAL
  Filled 2016-04-13 (×5): qty 1

## 2016-04-13 NOTE — Progress Notes (Signed)
Patient ID: Kenneth Merritt, male   DOB: 03/13/1951, 65 y.o.   MRN: [id number]  PROGRESS NOTE    Kenneth Merritt  Kenneth Merritt DOB: 11/10/1951 DOA: 04/06/2016  PCP: Neale Burly, MD  Brief Narrative:   65 year old male with history of COPD and PAF on anticoagulation with Eliquis, hypertension, dyslipidemia who presented to [facility] ED with altered metnal status and respiratory distress with hypoxia requiring BIPAP. CXR showed congestion and possible pneumonia, CT of abdomen and pelvis showed colitis.  Patient was in more respiratory distress 04/09/2016, requiring Ventimask.  Resp status improving  Having Bms and tolerating full liquids  Assessment & Plan:  Sepsis secondary to Diverticulitis/colitis doubt pneumonia - Sepsis present on admission with tachycardia, tachypnea, hypoxia, leukocytosis. Procalcitonin was 2.9 and lactic acid was 4.55. - Patient initially on vancomycin and Zosyn. Since blood cultures so far show no growth, stopped vancomycin 04/09/2016 - continue Zosyn for now -see below  Colitis vs Diverticulitis with Ileus -abd less  tense and having Bms now, although small smears -continue Zosyn,  stopped IVF due to fluid overload -appreciate GI input -tolerating full liquids  -defer to GI  Acute on chronic diastolic CHF - Pulmonary congestion on x-ray, now improving - improved with IV lasix, changed to Po lasix 6/3 -now off  Acute respiratory failure with hypoxia / CHF / possible COPD exacerbation  - repeat CXR -nremarkable - treated with IV lasix, steroids, nebs -uses 3L home O2 at baseline for COPD/chronic resp failure, change to Prednisone today -improved and close to baseline  Steroid-induced hyperglycemia / diabetes mellitus without complications without long-term insulin use - Continue sliding scale insulin - on metformin at home  Paroxysmal atrial fibrillation - CHADS vasc score is 4 - Continue Eliquis for [facility] - rate controlled with metoprolol. He was on Cardizem  drip for episodes of A. fib with RVR but currently rate controlled without Cardizem drip. Cardizem drip off at 4:00 6/1 -continue metoprolol, HR improved  Essential hypertension - Continue metoprolol 50 mg twice daily   Elevated troponin - Likely demand ischemia from hypoxic respiratory failure - No chest pain   DVT prophylaxis: On full dose anticoagulation with Eliquis Code Status: Full Code Family Communication:No  Family at bedside, called and updated wife 6/3 Disposition Plan:  home in 1-2days if stable   Consultants:   Gastroenterology   Procedures:   2 D ECHO 03/2016 - -EF 55% and garde 1 DD  LE doppler 5/29/201 - no evidence of DVT  Antimicrobials:   Zosyn 04/06/2016 -->  Vanco stopped 04/09/2016    Subjective: No overnight events. No respiratory distress. Wants to go home, frustrated abt diet too  Objective: Filed Vitals:   04/13/16 0133 04/13/16 0609 04/13/16 0753 04/13/16 0758  BP:  121/73    Pulse:  81    Temp:  98 F (36.7 C)    TempSrc:  Axillary    Resp:  20    Height:      Weight:  60.9 kg (134 lb 4.2 oz)    SpO2: 93% 100% 100% 100%    Intake/Output Summary (Last 24 hours) at 04/13/16 1013 Last data filed at 04/13/16 0700  Gross per 24 hour  Intake    510 ml  Output   1350 ml  Net   -840 ml   Filed Weights   04/11/16 0500 04/12/16 0500 04/13/16 0609  Weight: 63.9 kg (140 lb 14 oz) 60.6 kg (133 lb 9.6 oz) 60.9 kg (134 lb 4.2 oz)  Examination:  General exam: more alert this am, on [place] oxygen  Respiratory system: wheezing in upper lung lobes, no rhonchi  Cardiovascular system: tachcyardic, S1-S2 appreciated Gastrointestinal system: Appreciate bowel sounds, distended abdomen, less tense, not tender Central nervous system: More alert, no focal deficits Extremities: Symmetric extremities, trace lower extremity pitting edema Skin: Skin is warm and dry Psychiatry: No restlessness, normal mood  Data Reviewed: I have personally reviewed  following labs and imaging studies  CBC:  Recent Labs Lab 04/06/16 1900 04/07/16 0444 04/08/16 0524 04/10/16 0311 04/11/16 0328 04/12/16 0051 04/13/16 0511  WBC 21.1* 17.8* 18.0* 14.7* 12.8* 9.4 8.7  NEUTROABS 19.5* 16.9*  --   --   --   --   --   HGB 12.3* 11.3* 10.4* 10.5* 11.7* 10.7* 10.7*  HCT 39.8 35.4* 33.1* 33.5* 35.7* 33.3* 33.6*  MCV 90.9 90.5 90.7 90.1 87.3 87.4 87.3  PLT 198 180 184 204 228 218 [id number]   Basic Metabolic Panel:  Recent Labs Lab 04/08/16 0524 04/10/16 0311 04/11/16 0328 04/12/16 0051 04/12/16 0055 04/13/16 0511  NA 141 138 138 138  --  139  K 4.0 3.4* 3.8 3.4*  --  4.5  CL 99* 90* 90* 91*  --  95*  CO2 31 37* 38* 37*  --  36*  GLUCOSE 135* 177* 148* 186*  --  165*  BUN 17 17 17 17   --  17  CREATININE 0.66 0.62 0.62 0.62  --  0.64  CALCIUM 7.8* 8.4* 8.6* 8.2*  --  8.5*  MG  --   --   --   --  2.2  --    GFR: Estimated Creatinine Clearance: 78.1 mL/min (by C-G formula based on Cr of 0.64). Liver Function Tests:  Recent Labs Lab 04/06/16 1900 04/07/16 0444  AST 77* 84*  ALT 39 38  ALKPHOS 53 46  BILITOT 2.2* 1.8*  PROT 6.5 5.6*  ALBUMIN 3.4* 2.9*   No results for input(s): LIPASE, AMYLASE in the last 168 hours.  Recent Labs Lab 04/06/16 2238  AMMONIA 12   Coagulation Profile:  Recent Labs Lab 04/06/16 2238  INR 1.39   Cardiac Enzymes:  Recent Labs Lab 04/06/16 2238 04/07/16 0444 04/07/16 1015  TROPONINI 0.18* 0.12* 0.11*   BNP (last 3 results) No results for input(s): PROBNP in the last 8760 hours. HbA1C: No results for input(s): HGBA1C in the last 72 hours. CBG:  Recent Labs Lab 04/12/16 0758 04/12/16 1149 04/12/16 1650 04/12/16 2103 04/13/16 0840  GLUCAP 153* 153* 117* 153* 163*   Lipid Profile: No results for input(s): CHOL, HDL, LDLCALC, TRIG, CHOLHDL, LDLDIRECT in the last 72 hours. Thyroid Function Tests: No results for input(s): TSH, T4TOTAL, FREET4, T3FREE, THYROIDAB in the last 72  hours. Anemia Panel: No results for input(s): VITAMINB12, FOLATE, FERRITIN, TIBC, IRON, RETICCTPCT in the last 72 hours. Urine analysis:    Component Value Date/Time   COLORURINE AMBER* 04/06/2016 1942   APPEARANCEUR CLOUDY* 04/06/2016 1942   LABSPEC 1.021 04/06/2016 1942   PHURINE 5.0 04/06/2016 1942   GLUCOSEU 250* 04/06/2016 1942   HGBUR MODERATE* 04/06/2016 1942   BILIRUBINUR MODERATE* 04/06/2016 1942   KETONESUR 40* 04/06/2016 1942   PROTEINUR NEGATIVE 04/06/2016 1942   UROBILINOGEN 0.2 01/21/2010 1944   NITRITE NEGATIVE 04/06/2016 1942   LEUKOCYTESUR NEGATIVE 04/06/2016 1942   Sepsis Labs: @LABRCNTIP (procalcitonin:4,lacticidven:4)   Blood Culture (routine x 2)     Status: None (Preliminary result)   Collection Time: 04/06/16  6:40 PM  Result Value  Ref Range Status   Specimen Description BLOOD RIGHT ANTECUBITAL  Final   Special Requests BOTTLES DRAWN AEROBIC AND ANAEROBIC 5CC EA  Final   Culture   Final    NO GROWTH 1 DAY Performed at [facility]    Report Status PENDING  Incomplete  Blood Culture (routine x 2)     Status: None (Preliminary result)   Collection Time: 04/06/16  7:00 PM  Result Value Ref Range Status   Specimen Description BLOOD LEFT WRIST  Final   Special Requests IN PEDIATRIC BOTTLE 5CC  Final   Culture   Final    NO GROWTH 1 DAY Performed at [facility]    Report Status PENDING  Incomplete  Urine culture     Status: None   Collection Time: 04/06/16  7:42 PM  Result Value Ref Range Status   Specimen Description URINE, CATHETERIZED  Final   Special Requests NONE  Final   Culture NO GROWTH   Final   Report Status 04/08/2016 FINAL  Final  MRSA PCR Screening     Status: None   Collection Time: 04/06/16 10:21 PM  Result Value Ref Range Status   MRSA by PCR NEGATIVE NEGATIVE Final      Radiology Studies:  Dg Chest Port 1 View 04/10/2016  No acute abnormality noted. Electronically Signed   By: Inez Catalina M.D.   On: 04/10/2016  08:09   Ct Abdomen Pelvis W Contrast 04/07/2016 1. Soft tissue stranding in the left pericolic gutter. There short segment colonic wall thickening with possible small diverticulum at the junction of the descending and sigmoid colon a region of fat stranding. Differential considerations include mild acute diverticulitis, however no significant diverticular burden is seen elsewhere. Focal colitis is considered, with distribution suggesting ischemic etiology. 2. 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 3. Lingular consolidation is new from chest CT eleven days prior. Additional right lower lobe tree in bud opacities. Findings suspicious for infectious etiology/ pneumonia and bronchiolitis.   Dg Chest Port 1 View 04/07/2016 Decreasing pulmonary vascular congestion. Lingular opacity on CT is not well seen. Minimal patchy left basilar opacity, with mild improvement from prior radiograph.   Dg Chest Portable 1 View 04/06/2016  Vascular congestion noted. Mild left basilar opacity could reflect pneumonia. Mild right midlung and right apical scarring noted.    Scheduled Meds: . apixaban  5 mg Oral BID  . insulin aspart  0-9 Units Subcutaneous TID WC  . ipratropium  0.5 mg Nebulization Q6H  . levalbuterol  0.63 mg Nebulization Q6H  . metoprolol  50 mg Oral BID  . oxyCODONE  15 mg Oral Q12H  . pantoprazole  40 mg Oral Daily  . piperacillin-tazobactam (ZOSYN)  IV  3.375 g Intravenous Q8H  . polyethylene glycol  17 g Oral BID  . predniSONE  50 mg Oral Q breakfast  . simvastatin  40 mg Oral q1800   Continuous Infusions:     LOS: 7 days    Time spent: 25 minutes  Greater than 50% of the time spent on counseling and coordinating the care.   Domenic Polite, MD [facility] Pager [phone]  If 7PM-7AM, please contact night-coverage [email or web address] Password [email or web address] 04/13/2016, 10:13 AM

## 2016-04-13 NOTE — Progress Notes (Signed)
Patient ID: Kenneth Merritt, male   DOB: 15-Apr-1951, 65 y.o.   MRN: [id number] [facility] Gastroenterology Progress Note  CANE NAEEM 65 y.o. 1951-05-14   Subjective: Having loose stools. Complains of epigastric pain. Tolerating full liquid diet.  Objective: Vital signs in last 24 hours: Filed Vitals:   04/12/16 2105 04/13/16 0609  BP: 120/59 121/73  Pulse: 62 81  Temp: 97.6 F (36.4 C) 98 F (36.7 C)  Resp: 18 20    Physical Exam: Gen: lethargic, elderly, frail, mild acute distress CV: RRR Chest: Coarse breath sounds Abd: epigastric tenderness with minimal guarding, less distended, +BS Ext: no edema  Lab Results:  Recent Labs  04/12/16 0051 04/12/16 0055 04/13/16 0511  NA 138  --  139  K 3.4*  --  4.5  CL 91*  --  95*  CO2 37*  --  36*  GLUCOSE 186*  --  165*  BUN 17  --  17  CREATININE 0.62  --  0.64  CALCIUM 8.2*  --  8.5*  MG  --  2.2  --    No results for input(s): AST, ALT, ALKPHOS, BILITOT, PROT, ALBUMIN in the last 72 hours.  Recent Labs  04/12/16 0051 04/13/16 0511  WBC 9.4 8.7  HGB 10.7* 10.7*  HCT 33.3* 33.6*  MCV 87.4 87.3  PLT 218 226   No results for input(s): LABPROT, INR in the last 72 hours.    Assessment/Plan: Abdominal distention concerning for ileus with increase in loose stools and decreased distention. Ileus slowly resolving. Will do an abd Xray to look for any air-fluid levels. If Xray ok, then will change to soft diet. Will follow.    Hastings C. 04/13/2016, 9:44 AM  Pager [phone]  If no answer or after 5 PM call [phone]

## 2016-04-13 NOTE — Progress Notes (Signed)
Pt states his neb tx got dumped out when he was getting on bedpan. Rt gave pt a prn xopenex.

## 2016-04-14 LAB — GLUCOSE, CAPILLARY
GLUCOSE-CAPILLARY: 100 mg/dL — AB (ref 65–99)
GLUCOSE-CAPILLARY: 142 mg/dL — AB (ref 65–99)
Glucose-Capillary: 166 mg/dL — ABNORMAL HIGH (ref 65–99)
Glucose-Capillary: 134 mg/dL — ABNORMAL HIGH (ref 65–99)
Glucose-Capillary: 174 mg/dL — ABNORMAL HIGH (ref 65–99)

## 2016-04-14 MED ORDER — CIPROFLOXACIN HCL 250 MG PO TABS
250.0000 mg | ORAL_TABLET | Freq: Two times a day (BID) | ORAL | Status: DC
  Administered 2016-04-14 – 2016-04-15 (×3): 250 mg via ORAL
  Filled 2016-04-14 (×4): qty 1

## 2016-04-14 MED ORDER — LEVALBUTEROL HCL 0.63 MG/3ML IN NEBU
0.6300 mg | INHALATION_SOLUTION | Freq: Three times a day (TID) | RESPIRATORY_TRACT | Status: DC
  Administered 2016-04-14 – 2016-04-15 (×4): 0.63 mg via RESPIRATORY_TRACT
  Filled 2016-04-14 (×4): qty 3

## 2016-04-14 MED ORDER — GUAIFENESIN ER 600 MG PO TB12
600.0000 mg | ORAL_TABLET | Freq: Two times a day (BID) | ORAL | Status: DC | PRN
  Administered 2016-04-14: 600 mg via ORAL
  Filled 2016-04-14: qty 1

## 2016-04-14 MED ORDER — METRONIDAZOLE 500 MG PO TABS
250.0000 mg | ORAL_TABLET | Freq: Three times a day (TID) | ORAL | Status: DC
  Administered 2016-04-14 – 2016-04-15 (×3): 250 mg via ORAL
  Filled 2016-04-14 (×3): qty 1

## 2016-04-14 MED ORDER — PREDNISONE 20 MG PO TABS
40.0000 mg | ORAL_TABLET | Freq: Every day | ORAL | Status: DC
  Administered 2016-04-15: 40 mg via ORAL
  Filled 2016-04-14: qty 2

## 2016-04-14 MED ORDER — IPRATROPIUM BROMIDE 0.02 % IN SOLN
0.5000 mg | Freq: Three times a day (TID) | RESPIRATORY_TRACT | Status: DC
  Administered 2016-04-14 – 2016-04-15 (×4): 0.5 mg via RESPIRATORY_TRACT
  Filled 2016-04-14 (×4): qty 2.5

## 2016-04-14 NOTE — Progress Notes (Signed)
Patient ID: Kenneth Merritt, male   DOB: 06-Jan-1951, 65 y.o.   MRN: [id number]  PROGRESS NOTE    Kenneth Merritt  [id number] DOB: 09/09/1951 DOA: 04/06/2016  PCP: Neale Burly, MD  Brief Narrative:   65 year old male with history of COPD and PAF on anticoagulation with Eliquis, hypertension, dyslipidemia who presented to [facility] ED with altered metnal status and respiratory distress with hypoxia requiring BIPAP. CXR showed congestion and possible pneumonia, CT of abdomen and pelvis showed colitis. Patient was in more respiratory distress 04/09/2016, requiring Ventimask.  Resp status improving  Having Bms and tolerating full liquids  Assessment & Plan:  Sepsis secondary to Diverticulitis/colitis doubt pneumonia - Sepsis present on admission with tachycardia, tachypnea, hypoxia, leukocytosis. Procalcitonin was 2.9 and lactic acid was 4.55. - Patient initially on vancomycin and Zosyn. Since blood cultures so far show no growth, stopped vancomycin 04/09/2016 - change Zosyn to Cipro/FLagyl - see below  Colitis vs Diverticulitis with Ileus -abd less  tense and having Bms now, although small smears -continue Zosyn,  stopped IVF due to fluid overload -appreciate GI input, diet advanced to soft yesterday and tolerating this, having small amounts of frequent stools/smears now, will back off on laxatives now  -KUB /ileus much improved  Acute on chronic diastolic CHF - Pulmonary congestion on x-ray, now improving - improved with IV lasix, changed to Po lasix 6/3 -now off -ECHO with preserved EF  Acute respiratory failure with hypoxia / acute diastolic CHF and possible COPD exacerbation  - repeat CXR -nremarkable - treated with IV lasix, IV steroids, nebs -uses 3L home O2 at baseline for COPD/chronic resp failure, changed to Prednisone-continue taper -improved and close to baseline  Steroid-induced hyperglycemia / diabetes mellitus without complications without long-term insulin use - Continue  sliding scale insulin - on metformin at home  Paroxysmal atrial fibrillation - CHADS vasc score is 4 - Continue Eliquis for [facility] - rate controlled with metoprolol. He was on Cardizem drip for episodes of A. fib with RVR but currently rate controlled without Cardizem drip. Cardizem drip off at 4:00 6/1 -continue metoprolol, HR up a little this am, hasnt received Am meds yet, will monitor  Essential hypertension - Continue metoprolol 50 mg twice daily   Elevated troponin - Likely demand ischemia from hypoxic respiratory failure - No chest pain  -ECHO with normal EF and wall motion  DVT prophylaxis: On full dose anticoagulation with Eliquis Code Status: Full Code Family Communication:No  Family at bedside, called and updated wife 6/3 Disposition Plan:  Home vs SNF soon   Consultants:   Gastroenterology   Procedures:   2 D ECHO 03/2016 - -EF 55% and garde 1 DD  LE doppler 5/29/201 - no evidence of DVT  Antimicrobials:   Zosyn 04/06/2016 -->  Vanco stopped 04/09/2016    Subjective: No overnight events. No respiratory distress. Wants to go home, frustrated abt diet too  Objective: Filed Vitals:   04/13/16 2127 04/14/16 0224 04/14/16 0551 04/14/16 0752  BP: 128/76  126/76   Pulse: 99  100   Temp: 97.6 F (36.4 C)  97.6 F (36.4 C)   TempSrc: Oral  Oral   Resp: 18  15   Height:      Weight:   61.1 kg (134 lb 11.2 oz)   SpO2: 100% 99% 100% 98%    Intake/Output Summary (Last 24 hours) at 04/14/16 1019 Last data filed at 04/14/16 0700  Gross per 24 hour  Intake    390 ml  Output   1300 ml  Net   -910 ml   Filed Weights   04/12/16 0500 04/13/16 0609 04/14/16 0551  Weight: 60.6 kg (133 lb 9.6 oz) 60.9 kg (134 lb 4.2 oz) 61.1 kg (134 lb 11.2 oz)    Examination:  General exam: more alert this am, on [place] oxygen  Respiratory system: wheezing in upper lung lobes, no rhonchi  Cardiovascular system: tachcyardic, S1-S2 appreciated Gastrointestinal system:  Appreciate bowel sounds, distended abdomen, less tense, not tender Central nervous system: More alert, no focal deficits Extremities: Symmetric extremities, trace lower extremity pitting edema Skin: Skin is warm and dry Psychiatry: No restlessness, normal mood  Data Reviewed: I have personally reviewed following labs and imaging studies  CBC:  Recent Labs Lab 04/08/16 0524 04/10/16 0311 04/11/16 0328 04/12/16 0051 04/13/16 0511  WBC 18.0* 14.7* 12.8* 9.4 8.7  HGB 10.4* 10.5* 11.7* 10.7* 10.7*  HCT 33.1* 33.5* 35.7* 33.3* 33.6*  MCV 90.7 90.1 87.3 87.4 87.3  PLT 184 204 228 218 [id number]   Basic Metabolic Panel:  Recent Labs Lab 04/08/16 0524 04/10/16 0311 04/11/16 0328 04/12/16 0051 04/12/16 0055 04/13/16 0511  NA 141 138 138 138  --  139  K 4.0 3.4* 3.8 3.4*  --  4.5  CL 99* 90* 90* 91*  --  95*  CO2 31 37* 38* 37*  --  36*  GLUCOSE 135* 177* 148* 186*  --  165*  BUN 17 17 17 17   --  17  CREATININE 0.66 0.62 0.62 0.62  --  0.64  CALCIUM 7.8* 8.4* 8.6* 8.2*  --  8.5*  MG  --   --   --   --  2.2  --    GFR: Estimated Creatinine Clearance: 78.1 mL/min (by C-G formula based on Cr of 0.64). Liver Function Tests: No results for input(s): AST, ALT, ALKPHOS, BILITOT, PROT, ALBUMIN in the last 168 hours. No results for input(s): LIPASE, AMYLASE in the last 168 hours. No results for input(s): AMMONIA in the last 168 hours. Coagulation Profile: No results for input(s): INR, PROTIME in the last 168 hours. Cardiac Enzymes: No results for input(s): CKTOTAL, CKMB, CKMBINDEX, TROPONINI in the last 168 hours. BNP (last 3 results) No results for input(s): PROBNP in the last 8760 hours. HbA1C: No results for input(s): HGBA1C in the last 72 hours. CBG:  Recent Labs Lab 04/13/16 0840 04/13/16 1216 04/13/16 1618 04/13/16 2125 04/14/16 0737  GLUCAP 163* 155* 140* 203* 134*   Lipid Profile: No results for input(s): CHOL, HDL, LDLCALC, TRIG, CHOLHDL, LDLDIRECT in the last 72  hours. Thyroid Function Tests: No results for input(s): TSH, T4TOTAL, FREET4, T3FREE, THYROIDAB in the last 72 hours. Anemia Panel: No results for input(s): VITAMINB12, FOLATE, FERRITIN, TIBC, IRON, RETICCTPCT in the last 72 hours. Urine analysis:    Component Value Date/Time   COLORURINE AMBER* 04/06/2016 1942   APPEARANCEUR CLOUDY* 04/06/2016 1942   LABSPEC 1.021 04/06/2016 1942   PHURINE 5.0 04/06/2016 1942   GLUCOSEU 250* 04/06/2016 1942   HGBUR MODERATE* 04/06/2016 1942   BILIRUBINUR MODERATE* 04/06/2016 1942   KETONESUR 40* 04/06/2016 1942   PROTEINUR NEGATIVE 04/06/2016 1942   UROBILINOGEN 0.2 01/21/2010 1944   NITRITE NEGATIVE 04/06/2016 1942   LEUKOCYTESUR NEGATIVE 04/06/2016 1942   Sepsis Labs: @LABRCNTIP (procalcitonin:4,lacticidven:4)   Blood Culture (routine x 2)     Status: None (Preliminary result)   Collection Time: 04/06/16  6:40 PM  Result Value Ref Range Status   Specimen Description BLOOD RIGHT ANTECUBITAL  Final   Special Requests BOTTLES DRAWN AEROBIC AND ANAEROBIC 5CC EA  Final   Culture   Final    NO GROWTH 1 DAY Performed at [facility]    Report Status PENDING  Incomplete  Blood Culture (routine x 2)     Status: None (Preliminary result)   Collection Time: 04/06/16  7:00 PM  Result Value Ref Range Status   Specimen Description BLOOD LEFT WRIST  Final   Special Requests IN PEDIATRIC BOTTLE 5CC  Final   Culture   Final    NO GROWTH 1 DAY Performed at [facility]    Report Status PENDING  Incomplete  Urine culture     Status: None   Collection Time: 04/06/16  7:42 PM  Result Value Ref Range Status   Specimen Description URINE, CATHETERIZED  Final   Special Requests NONE  Final   Culture NO GROWTH   Final   Report Status 04/08/2016 FINAL  Final  MRSA PCR Screening     Status: None   Collection Time: 04/06/16 10:21 PM  Result Value Ref Range Status   MRSA by PCR NEGATIVE NEGATIVE Final      Radiology Studies:  Dg Chest  Port 1 View 04/10/2016  No acute abnormality noted. Electronically Signed   By: Inez Catalina M.D.   On: 04/10/2016 08:09   Ct Abdomen Pelvis W Contrast 04/07/2016 1. Soft tissue stranding in the left pericolic gutter. There short segment colonic wall thickening with possible small diverticulum at the junction of the descending and sigmoid colon a region of fat stranding. Differential considerations include mild acute diverticulitis, however no significant diverticular burden is seen elsewhere. Focal colitis is considered, with distribution suggesting ischemic etiology. 2. 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 3. Lingular consolidation is new from chest CT eleven days prior. Additional right lower lobe tree in bud opacities. Findings suspicious for infectious etiology/ pneumonia and bronchiolitis.   Dg Chest Port 1 View 04/07/2016 Decreasing pulmonary vascular congestion. Lingular opacity on CT is not well seen. Minimal patchy left basilar opacity, with mild improvement from prior radiograph.   Dg Chest Portable 1 View 04/06/2016  Vascular congestion noted. Mild left basilar opacity could reflect pneumonia. Mild right midlung and right apical scarring noted.    Scheduled Meds: . apixaban  5 mg Oral BID  . insulin aspart  0-9 Units Subcutaneous TID WC  . ipratropium  0.5 mg Nebulization TID  . levalbuterol  0.63 mg Nebulization TID  . metoprolol  50 mg Oral BID  . oxyCODONE  15 mg Oral Q12H  . pantoprazole  40 mg Oral Daily  . piperacillin-tazobactam (ZOSYN)  IV  3.375 g Intravenous Q8H  . [START ON 04/15/2016] predniSONE  40 mg Oral Q breakfast  . simvastatin  40 mg Oral q1800   Continuous Infusions:     LOS: 8 days    Time spent: 25 minutes  Greater than 50% of the time spent on counseling and coordinating the care.   Domenic Polite, MD [facility] Pager [phone]  If 7PM-7AM, please contact night-coverage [email or web address] Password [email or web address] 04/14/2016, 10:19 AM

## 2016-04-14 NOTE — Progress Notes (Signed)
Patient has short episodes of A-fib when HR is greater than 140 bpm. Patient converts back into NSR after about a minute. Patient resting quietly during episode. Will continue to monitor closely

## 2016-04-14 NOTE — Care Management Important Message (Signed)
Important Message  Patient Details  Name: AYREN STALNAKER MRN: [id number] Date of Birth: [birth year]   Medicare Important Message Given:  Yes    Camillo Flaming 04/14/2016, 10:25 AMImportant Message  Patient Details  Name: ZAIDIN SKIVER MRN: [id number] Date of Birth: Mar 21, 1951   Medicare Important Message Given:  Yes    Camillo Flaming 04/14/2016, 10:25 AM

## 2016-04-14 NOTE — Progress Notes (Signed)
Pt alert and oriented to person, place, time and reason here.  Pt stable on 3Lpm nasal cannula.  No BiPAP indicated at this time.

## 2016-04-14 NOTE — Evaluation (Signed)
Physical Therapy Evaluation Patient Details Name: Kenneth Merritt MRN: [id number] DOB: Sep 19, 1951 Today's Date: 04/14/2016   History of Present Illness  65 yo male admitted with colitis, sepsis, fall. Hx of chronic Afib, PVD, COPD, DJD, MI.   Clinical Impression  On eval, pt required Min assist for mobility-walked ~15'x2 with RW. Pt is generally weak and activity tolerance is limited at this time. Discussed d/c plan-pt states he does not want to go to a SNF. He reports wife is unable to physically assist him. Will follow and progress activity as tolerated.     Follow Up Recommendations SNF (Pt declines placement so will recommend HHPT; 24 hour supervision and home health aide, if possible)    Equipment Recommendations  Rolling walker with 5" wheels    Recommendations for Other Services OT consult     Precautions / Restrictions Precautions Precautions: Fall Precaution Comments: O2 dep Restrictions Weight Bearing Restrictions: No      Mobility  Bed Mobility Overal bed mobility: Needs Assistance Bed Mobility: Supine to Sit     Supine to sit: Supervision     General bed mobility comments: for safety, lines  Transfers Overall transfer level: Needs assistance   Transfers: Sit to/from Stand Sit to Stand: Min assist         General transfer comment: Assist to rise, stabilize, control descent. Pt very unsteady without use of assistive device.   Ambulation/Gait Ambulation/Gait assistance: Min assist Ambulation Distance (Feet): 15 Feet (x2) Assistive device: Rolling walker (2 wheeled) Gait Pattern/deviations: Step-through pattern;Decreased stride length;Trunk flexed     General Gait Details: very slow gait speed. unsteady. Assist to stabilize. Dyspnea 2/4.   Stairs            Wheelchair Mobility    Modified Rankin (Stroke Patients Only)       Balance Overall balance assessment: History of Falls                                            Pertinent Vitals/Pain Pain Assessment: No/denies pain    Home Living Family/patient expects to be discharged to:: Private residence Living Arrangements: Spouse/significant other Available Help at Discharge:  (wife unable to physically assist) Type of Home: House Home Access: Stairs to enter Entrance Stairs-Rails: Right Entrance Stairs-Number of Steps: 3 Home Layout: Able to live on main level with bedroom/bathroom Home Equipment: Cane - single point      Prior Function Level of Independence: Independent               Hand Dominance        Extremity/Trunk Assessment   Upper Extremity Assessment: Generalized weakness           Lower Extremity Assessment: Generalized weakness      Cervical / Trunk Assessment: Kyphotic  Communication   Communication: No difficulties  Cognition Arousal/Alertness: Awake/alert Behavior During Therapy: WFL for tasks assessed/performed Overall Cognitive Status: Within Functional Limits for tasks assessed                      General Comments      Exercises        Assessment/Plan    PT Assessment Patient needs continued PT services  PT Diagnosis Difficulty walking;Generalized weakness   PT Problem List Decreased strength;Decreased activity tolerance;Decreased balance;Decreased mobility;Decreased knowledge of use of DME;Pain  PT Treatment Interventions Gait training;Functional mobility  training;Therapeutic activities;Patient/family education;Balance training;Therapeutic exercise;DME instruction   PT Goals (Current goals can be found in the Care Plan section) Acute Rehab PT Goals Patient Stated Goal: home  PT Goal Formulation: With patient Time For Goal Achievement: 04/28/16 Potential to Achieve Goals: Good    Frequency Min 3X/week   Barriers to discharge        Co-evaluation               End of Session Equipment Utilized During Treatment: Oxygen Activity Tolerance: Patient tolerated treatment  well Patient left: in chair;with call bell/phone within reach;with chair alarm set           Time: [id number] PT Time Calculation (min) (ACUTE ONLY): 26 min   Charges:   PT Evaluation $PT Eval Low Complexity: 1 Procedure PT Treatments $Gait Training: 8-22 mins   PT G Codes:        Weston Anna, MPT Pager: [phone]

## 2016-04-14 NOTE — Care Management Note (Signed)
Case Management Note  Patient Details  Name: Kenneth Merritt MRN: [id number] Date of Birth: May 15, 1951  Subjective/Objective: PT recc SNF-patient pleasantly declines-CSW notified of decline. [facility] chosen for [employer] aware & following-recc HHRN/PT/OT/aide,sw-await HHC orders, f53f. Also recc for home rw-await home rw order.Active w/[facility] dme for home 02-has travel tank.                    Action/Plan:d/c home w/HHC/dme.   Expected Discharge Date:   (unknown)               Expected Discharge Plan:  [place]  In-House Referral:     Discharge planning Services  CM Consult  Post Acute Care Choice:   (Active w/[facility] dme-home 02,has travel tank) Choice offered to:  Patient  DME Arranged:    DME Agency:  NA, [place]:  NA [place] Agency:  NA, [place]  Status of Service:  In process, will continue to follow  Medicare Important Message Given:  Yes Date Medicare IM Given:    Medicare IM give by:    Date Additional Medicare IM Given:    Additional Medicare Important Message give by:     If discussed at [place] of Stay Meetings, dates discussed:    Additional Comments:  Dessa Phi, RN 04/14/2016, 3:41 PM

## 2016-04-14 NOTE — Progress Notes (Signed)
Kenneth [facility]itt [facility] Progress Note  Subjective: No complaints of abdominal pain today. Having some diarrhea.  Objective: Vital signs in last 24 hours: Temp:  [97.4 F (36.3 C)-97.6 F (36.4 C)] 97.6 F (36.4 C) (06/05 0551) Pulse Rate:  [82-100] 100 (06/05 0551) Resp:  [15-20] 15 (06/05 0551) BP: (126-140)/(71-76) 126/76 mmHg (06/05 0551) SpO2:  [98 %-100 %] 98 % (06/05 0752) Weight:  [61.1 kg (134 lb 11.2 oz)] 61.1 kg (134 lb 11.2 oz) (06/05 0551) Weight change: 0.2 kg (7.1 oz)   PE:  No distress  Heart regular rhythm  Lungs with coarse breath sounds and rhonchi  Abdomen slight distention with bowel sounds present nontender  Lab Results: Results for orders placed or performed during the hospital encounter of 04/06/16 (from the past 24 hour(s))  Glucose, capillary     Status: Abnormal   Collection Time: 04/13/16 12:16 PM  Result Value Ref Range   Glucose-Capillary 155 (H) 65 - 99 mg/dL  Glucose, capillary     Status: Abnormal   Collection Time: 04/13/16  4:18 PM  Result Value Ref Range   Glucose-Capillary 140 (H) 65 - 99 mg/dL  Glucose, capillary     Status: Abnormal   Collection Time: 04/13/16  9:25 PM  Result Value Ref Range   Glucose-Capillary 203 (H) 65 - 99 mg/dL  Glucose, capillary     Status: Abnormal   Collection Time: 04/14/16  7:37 AM  Result Value Ref Range   Glucose-Capillary 134 (H) 65 - 99 mg/dL   Comment 1 Notify RN     Studies/Results: Dg Chest Port 1 View  04/13/2016  CLINICAL DATA:  Dyspnea for 1 hour EXAM: PORTABLE CHEST 1 VIEW COMPARISON:  04/13/2016 FINDINGS: Advanced emphysema and biapical scarring. No evidence of acute superimposed pneumonia or edema. No effusion or pneumothorax (linear lucency at both bases attributed of Mach effect). Normal heart size and mediastinal contours. IMPRESSION: Emphysema and lung scarring without acute superimposed finding. Electronically Signed   By: Monte Fantasia M.D.   On: 04/13/2016 22:49       Assessment: Ileus resolving  Plan:   Continue medical management and supportive care. We will sign off. Call us if needed.    Cassell Clement 04/14/2016, 11:32 AM  Pager: [phone] If no answer or after 5 PM call [phone]

## 2016-04-15 DIAGNOSIS — R140 Abdominal distension (gaseous): Secondary | ICD-10-CM

## 2016-04-15 DIAGNOSIS — I4891 Unspecified atrial fibrillation: Secondary | ICD-10-CM

## 2016-04-15 LAB — GLUCOSE, CAPILLARY
GLUCOSE-CAPILLARY: 97 mg/dL (ref 65–99)
Glucose-Capillary: 152 mg/dL — ABNORMAL HIGH (ref 65–99)

## 2016-04-15 MED ORDER — PREDNISONE 20 MG PO TABS: 20.0000 mg | ORAL_TABLET | Freq: Every day | ORAL | Status: DC

## 2016-04-15 MED ORDER — METRONIDAZOLE 250 MG PO TABS: 250.0000 mg | ORAL_TABLET | Freq: Three times a day (TID) | ORAL | Status: DC

## 2016-04-15 MED ORDER — HYDROCODONE-ACETAMINOPHEN 5-325 MG PO TABS: 1.0000 | ORAL_TABLET | Freq: Four times a day (QID) | ORAL | Status: AC | PRN

## 2016-04-15 MED ORDER — FUROSEMIDE 20 MG PO TABS: 20.0000 mg | ORAL_TABLET | Freq: Every day | ORAL | Status: DC

## 2016-04-15 MED ORDER — CIPROFLOXACIN HCL 250 MG PO TABS: 250.0000 mg | ORAL_TABLET | Freq: Two times a day (BID) | ORAL | Status: DC

## 2016-04-15 MED ORDER — LEVALBUTEROL HCL 0.63 MG/3ML IN NEBU: 0.6300 mg | INHALATION_SOLUTION | RESPIRATORY_TRACT | Status: DC | PRN

## 2016-04-15 NOTE — Consult Note (Addendum)
   [facility] CM Inpatient Consult   04/15/2016  Kenneth Merritt 07-10-1951 [id number]    Patient screened for long-term disease management services with [facility] program. Also referral received from inpatient RNCM. Went to bedside to discuss and offer [place] services. Patient is agreeable and written consent signed. Explained to patient that she will receive post hospital transition of care calls and will be evaluated for monthly home visits. Confirmed Primary Care MD as Hasanaj.  Confirmed best contact number as [phone]. Explained that [facility] will not interfere or replace services provided by home health. Kenneth Merritt denies having issues with medication or with transportation. He has a history of COPD, AFIB. Kenneth Merritt declined SNF placement. He states," I do not need rehab. I will be fine at home". He states he is supposed to discharge home today. Discussed primary focus will be for COPD disease and symptom management. He is agreeable to this.  Left [facility] Care Management packet and contact information at bedside. Will make inpatient RNCM aware that patient will be followed by [facility] [facility] post hospital discharge.  Will request for patient to be assigned to [place].  Addendum: Spoke with Dr. Broadus John who states patient is a high risk for readmission. Will pass this along to [place] as well once assigned. Kenneth Merritt will also have home health arranged.   Marthenia Rolling, MSN-Ed, RN,BSN [facility] Liaison [phone]

## 2016-04-15 NOTE — Discharge Summary (Signed)
Physician Discharge Summary  Kenneth Merritt [id number] DOB: [birth year] DOA: 04/06/2016  PCP: Neale Burly, MD  Admit date: 04/06/2016 Discharge date: 04/15/2016  Time spent: 45 minutes  Recommendations for Outpatient Follow-up:  1. [facility] /Home health for high risk patients program 2. Home health RN/OT/OT/SW/aide 3. PCP in 1 week, needs referral to Pulmonary 4. Titrate lasix dose as needed upon FU and check Bmet in 1 week   Discharge Diagnoses:  Principal Problem:   Sepsis ([place])   Colitis vs diverticulitis   Severe COPD/CHronic resp failure on 3L Home O2   Mild Diastolic CHF   COPD (chronic obstructive pulmonary disease) (HCC)   S/P aortobifemoral bypass surgery   CAD (coronary artery disease), native coronary artery   PAF (paroxysmal atrial fibrillation) (HCC)   HCAP (healthcare-associated pneumonia)   Elevated troponin   Discharge Condition: stable  Diet recommendation: heart healthy  Filed Weights   04/13/16 0609 04/14/16 0551 04/15/16 0425  Weight: 60.9 kg (134 lb 4.2 oz) 61.1 kg (134 lb 11.2 oz) 60.9 kg (134 lb 4.2 oz)    History of present illness:  Kenneth Merritt is a 66 y.o. male with medical history significant of chronic atrial fibrillation, hyperlipidemia, peripheral vascular disease status post aorta femoral bypass was brought to the ER after patient was found to have a fall with persistent pain. Patient fell 2 days ago in the bathroom witnessed by patient's daughter.  Patient had come to the ER a week ago for back pain and was found to have a compression fracture at that time. In the ER patient was found to be tachycardic hypotensive with blood work showing elevated lactic acid and leukocytosis. Chest x-ray was showing possible infiltrates for pneumonia and on exam patient also had left flank and left lower quadrant tenderness, CT with colitis  Hospital Course:  Sepsis secondary to Diverticulitis/colitis doubt pneumonia - Sepsis present on admission with  tachycardia, tachypnea, hypoxia, leukocytosis. Procalcitonin was 2.9 and lactic acid was 4.55. - Patient initially on vancomycin and Zosyn. Since blood cultures so far show no growth, stopped vancomycin 04/09/2016 - change Zosyn to Cipro/FLagyl - see below  Colitis vs Diverticulitis with Ileus -improving -abd less tense and having Bms now, although small  -treated with IV Zosyn, IVF  Then stopped due to fluid overload -consulted GI , diet advanced to soft yesterday and tolerating this, having small amounts of frequent stools/smears now, -KUB /ileus much improved -Abx changed to CIpro and FLagyl to complete 10day course  Acute on chronic diastolic CHF - iatrogenic in part - Pulmonary congestion on x-ray after admission, now improving - improved with IV lasix, changed to Po lasix 6/3 -now off -ECHO with preserved EF  Acute respiratory failure with hypoxia / acute diastolic CHF and possible COPD exacerbation  - repeat CXR -nremarkable - treated with IV lasix, IV steroids, nebs -uses 3L home O2 at baseline for COPD/chronic resp failure, changed to Prednisone-continue taper -improved and close to baseline -SNF recommended, pt declined this -[facility] set up for close FU and Pulm referral made  Steroid-induced hyperglycemia / diabetes mellitus without complications without long-term insulin use - Continue sliding scale insulin - on metformin at home  Paroxysmal atrial fibrillation - CHADS vasc score is 4 - Continue Eliquis for [facility] - rate controlled with metoprolol. He was on Cardizem drip for episodes of A. fib with RVR but currently rate controlled without Cardizem drip. Cardizem drip off at 4:00 6/1 -continue metoprolol, now stable  Essential hypertension - Continue metoprolol  50 mg twice daily  Elevated troponin - Likely demand ischemia from hypoxic respiratory failure - No chest pain  -ECHO with normal EF and wall motion  Disposition Plan: Home declines SNF  Consultants:    Gastroenterology  Procedures:   2 D ECHO 03/2016 - -EF 55% and garde 1 DD  LE doppler 5/29/201 - no evidence of DVT  Discharge Exam: Filed Vitals:   04/14/16 2144 04/15/16 0425  BP: 99/66 108/88  Pulse: 94 93  Temp: 98.2 F (36.8 C) 97.7 F (36.5 C)  Resp: 18 18    General: AAOx3 Cardiovascular: S1S2/RRR Respiratory: much improved air movement, rare wheezes  Discharge Instructions   Discharge Instructions    AMB Referral to [facility]    Complete by:  As directed   Please assign to [facility] for COPD disease and symptom management and transition of care. To discharge home today.  Written consent signed. Please call with questions. Thank you. Marthenia Rolling, [place], RN,BSN-[facility] Liaison-[phone]  Reason for consult:  Please assign to [facility] RNCM  Diagnoses of:  COPD/ Pneumonia  Expected date of contact:  1-3 days (reserved for hospital discharges)     Diet - low sodium heart healthy    Complete by:  As directed      Diet Carb Modified    Complete by:  As directed      Increase activity slowly    Complete by:  As directed           Current Discharge Medication List    START taking these medications   Details  ciprofloxacin (CIPRO) 250 MG tablet Take 1 tablet (250 mg total) by mouth 2 (two) times daily. For 5days Qty: 10 tablet, Refills: 0    furosemide (LASIX) 20 MG tablet Take 1 tablet (20 mg total) by mouth daily. For 3days then as needed for swelling/weight gain of >2lbs overnight or 4-5lbs in 1 week Qty: 30 tablet, Refills: 0    metroNIDAZOLE (FLAGYL) 250 MG tablet Take 1 tablet (250 mg total) by mouth every 8 (eight) hours. For 5days Qty: 15 tablet, Refills: 0      CONTINUE these medications which have CHANGED   Details  HYDROcodone-acetaminophen (NORCO/VICODIN) 5-325 MG tablet Take 1-2 tablets by mouth every 6 (six) hours as needed for moderate pain or severe pain. Refills: 0    predniSONE (DELTASONE) 20 MG tablet Take  1-2 tablets (20-40 mg total) by mouth daily. Take 40mg  for 3days then 20mg  for 3days then STOP Qty: 10 tablet, Refills: 0      CONTINUE these medications which have NOT CHANGED   Details  acetaminophen (TYLENOL) 500 MG tablet Take 1,000 mg by mouth every 6 (six) hours as needed for mild pain.    albuterol (PROVENTIL HFA;VENTOLIN HFA) 108 (90 BASE) MCG/ACT inhaler Inhale 2 puffs into the lungs every 4 (four) hours as needed for wheezing or shortness of breath. Qty: 1 Inhaler, Refills: 0    albuterol (PROVENTIL) (2.5 MG/3ML) 0.083% nebulizer solution Take 3 mLs (2.5 mg total) by nebulization every 4 (four) hours as needed for wheezing or shortness of breath. Qty: 75 mL, Refills: 12    ALPRAZolam (XANAX) 0.25 MG tablet Take 0.25 mg by mouth at bedtime as needed for sleep.  Refills: 0    ELIQUIS 5 MG TABS tablet TAKE 1 TABLET BY MOUTH TWICE A DAY Qty: 60 tablet, Refills: 3    ipratropium (ATROVENT) 0.02 % nebulizer solution Take 0.5 mg by nebulization every  4 (four) hours as needed for wheezing or shortness of breath.    metFORMIN (GLUCOPHAGE) 1000 MG tablet Take 1,000 mg by mouth daily with breakfast.    metoprolol (LOPRESSOR) 50 MG tablet Take 50 mg by mouth 2 (two) times daily.    nitroGLYCERIN (NITROSTAT) 0.4 MG SL tablet Place 1 tablet (0.4 mg total) under the tongue every 5 (five) minutes x 3 doses as needed for chest pain. Qty: 25 tablet, Refills: 3    omeprazole (PRILOSEC) 20 MG capsule Take 20 mg by mouth every morning.     oxyCODONE (OXYCONTIN) 15 mg 12 hr tablet Take 1 tablet (15 mg total) by mouth every 12 (twelve) hours. Qty: 6 tablet, Refills: 0    simvastatin (ZOCOR) 40 MG tablet TAKE 1 TABLET (40 MG TOTAL) BY MOUTH EVERY EVENING. Qty: 30 tablet, Refills: 9    digoxin (LANOXIN) 0.125 MG tablet Take 1 tablet (0.125 mg total) by mouth daily. Qty: 30 tablet, Refills: 0    levalbuterol (XOPENEX HFA) 45 MCG/ACT inhaler Inhale 2 puffs into the lungs every 4 (four) hours  as needed for wheezing. Qty: 1 Inhaler, Refills: 1      STOP taking these medications     gabapentin (NEURONTIN) 300 MG capsule      methocarbamol (ROBAXIN) 500 MG tablet      doxycycline (VIBRA-TABS) 100 MG tablet        Allergies  Allergen Reactions  . Levaquin [Levofloxacin In D5w] Other (See Comments)    Pt states it caused dry mouth and rectal bleeding.    Follow-up Information    Follow up with Neale Burly, MD. Schedule an appointment as soon as possible for a visit in 1 week.   Specialty:  Internal Medicine   Contact information:   [place]  [id number] [id number]        The results of significant diagnostics from this hospitalization (including imaging, microbiology, ancillary and laboratory) are listed below for reference.    Significant Diagnostic Studies: Dg Chest 2 View  03/29/2016  CLINICAL DATA:  Continuing chest pain and difficulty breathing starting 4 days ago. EXAM: CHEST  2 VIEW COMPARISON:  Chest x-rays dated 03/27/2016, 03/19/2016 and 09/19/2015. FINDINGS: Heart size is normal. Overall cardiomediastinal silhouette is stable in size and configuration. Lungs are hyperexpanded. Suspect chronic bronchitic changes centrally. Small areas of chronic scarring/ fibrosis are seen along the right lateral chest wall on at each lung apex, better characterized on recent chest CT. No evidence of pneumonia. No pleural effusion or pneumothorax seen. There are chronic-appearing compression fracture deformities within the mid thoracic spine, but the more superior vertebral body demonstrates slight progression of the compression compared to the earlier plain film exam of 11/20/2015. IMPRESSION: 1. Hyperexpanded lungs indicating COPD/emphysema. 2. No evidence of acute cardiopulmonary abnormality. No evidence of pneumonia. No pleural effusion or pneumothorax. 3. Chronic compression fracture deformity within the mid thoracic spine, but slightly increased  compression compared to the earlier plain film of 11/20/2015. Any acute/recent symptoms within the mid thoracic spine? Milder compression deformity of the immediately subjacent thoracic vertebral body is stable. Electronically Signed   By: Franki Cabot M.D.   On: 03/29/2016 18:35   Dg Chest 2 View  03/27/2016  CLINICAL DATA:  LEFT-sided chest pain.  Increased short of breath EXAM: CHEST  2 VIEW COMPARISON:  03/19/2016 FINDINGS: Lungs are hyperinflated. Normal cardiac silhouette. No effusion, infiltrate pneumothorax. Biapical pleural thickening. Compression deformities in the mid thoracic spine with mild  kyphosis. IMPRESSION: 1.  No acute cardiopulmonary process. 2. Hyperinflated lungs suggest emphysema. Electronically Signed   By: Suzy Bouchard M.D.   On: 03/27/2016 16:48   Ct Angio Chest Pe W/cm &/or Wo Cm  03/27/2016  CLINICAL DATA:  65 year old male with COPD with increasing shortness of breath. EXAM: CT ANGIOGRAPHY CHEST WITH CONTRAST TECHNIQUE: Multidetector CT imaging of the chest was performed using the standard protocol during bolus administration of intravenous contrast. Multiplanar CT image reconstructions and MIPs were obtained to evaluate the vascular anatomy. CONTRAST:  100 cc Isovue 370 COMPARISON:  Chest radiograph dated 03/27/2016 FINDINGS: Evaluation of this exam is limited due to respiratory motion artifact. There is emphysematous changes of the lungs with biapical scarring. There is no focal consolidation, pleural effusion, or pneumothorax. The central airways are patent. There is mild atherosclerotic calcification of the thoracic aorta. No aneurysmal dilatation or dissection. The origins of the great vessels of the aortic arch appear patent. No CT evidence of pulmonary embolism. Evaluation however is limited due to respiratory motion artifact. There is no cardiomegaly or pericardial effusion. No hilar or mediastinal adenopathy. The esophagus and the thyroid gland are grossly  unremarkable. There is no axillary adenopathy. The chest wall soft tissues appear unremarkable. There is osteopenia with degenerative changes of the spine. There is age indeterminate compression deformity of the T7 vertebra with approximately 50% loss of vertebral body height and anterior wedging. Clinical correlation is recommended. Mild age indeterminate compression deformities of the T8 and T9 vertebrae is also noted. No definite acute fracture identified. Review of the MIP images confirms the above findings. IMPRESSION: No CT evidence of pulmonary embolism. Emphysema.  No focal consolidation or pneumothorax. Age indeterminate compression fracture of the T7 vertebrae. Clinical correlation is recommended. Electronically Signed   By: Anner Crete M.D.   On: 03/27/2016 22:24   Ct Abdomen Pelvis W Contrast  04/07/2016  CLINICAL DATA:  Left flank pain. Lethargy, frequent falls. Elevated white blood cell count. EXAM: CT ABDOMEN AND PELVIS WITH CONTRAST TECHNIQUE: Multidetector CT imaging of the abdomen and pelvis was performed using the standard protocol following bolus administration of intravenous contrast. CONTRAST:  151mL ISOVUE-300 IOPAMIDOL (ISOVUE-300) INJECTION 61% COMPARISON:  Chest CT 03/27/2016. Report from CT abdomen/ pelvis 12/26/2012, images not available. CT 03/03/2012 FINDINGS: Lower chest: Consolidation in the lingula with air bronchograms, new from chest CT 10 days prior. Small focus of tree in bud opacities in the right lower lobe is also new. Right middle lobe scarring is unchanged. Liver: No focal lesion. Hepatobiliary: Gallbladder distention but no calcified stone or pericholecystic inflammation. Common bile duct normal in caliber. Pancreas: No ductal dilatation or inflammation. Spleen: Normal in size, no focal lesion. Adrenal glands: No nodule. Kidneys: Symmetric renal enhancement and excretion. No hydronephrosis. No perinephric stranding or focal abnormality. Stomach/Bowel: Stomach  physiologically distended. There are no dilated or thickened small bowel loops. Mild soft tissue stranding in the left pericolic gutter, there is a questionable colonic wall thickening and small diverticulum at the junction of the descending and sigmoid colon best depicted on coronal image 53 series 602. This is in the region of the pericolonic fat stranding. No definite diverticular disease elsewhere. No pneumatosis or free air. Portions of the appendix are visualized and normal. There is no right-sided inflammation. Vascular/Lymphatic: No retroperitoneal adenopathy. Post abdominal aortobifemoral bypass graft no periaortic soft tissue stranding. The graft appears patent. Aortic dimensions at the proximal aspect of the graft 2.3 cm maximal dimension. No large vessel mesenteric occlusion. The  IMA is not seen, likely excluded postsurgical. Celiac and superior mesenteric arteries are patent. Reproductive:  Brachytherapy seeds in the prostate gland. Bladder: Distended.  No bladder wall thickening. Other: No free air, free fluid, or intra-abdominal fluid collection. Musculoskeletal: There are no acute or suspicious osseous abnormalities. Avascular necrosis of both femoral heads. Question subchondral collapse on the right. IMPRESSION: 1. Soft tissue stranding in the left pericolic gutter. There short segment colonic wall thickening with possible small diverticulum at the junction of the descending and sigmoid colon a region of fat stranding. Differential considerations include mild acute diverticulitis, however no significant diverticular burden is seen elsewhere. Focal colitis is considered, with distribution suggesting ischemic etiology. 2. 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 3. Lingular consolidation is new from chest CT eleven days prior. Additional right lower lobe tree in bud opacities. Findings suspicious for infectious etiology/ pneumonia and bronchiolitis. Electronically Signed   By: Jeb Levering M.D.   On: 04/07/2016 01:51   Dg Chest Port 1 View  04/13/2016  CLINICAL DATA:  Dyspnea for 1 hour EXAM: PORTABLE CHEST 1 VIEW COMPARISON:  04/13/2016 FINDINGS: Advanced emphysema and biapical scarring. No evidence of acute superimposed pneumonia or edema. No effusion or pneumothorax (linear lucency at both bases attributed of Mach effect). Normal heart size and mediastinal contours. IMPRESSION: Emphysema and lung scarring without acute superimposed finding. Electronically Signed   By: Monte Fantasia M.D.   On: 04/13/2016 22:49   Dg Chest Port 1 View  04/11/2016  CLINICAL DATA:  Short of breath EXAM: PORTABLE CHEST 1 VIEW COMPARISON:  04/11/2016 FINDINGS: COPD with pulmonary hyperinflation apical scarring is unchanged. No superimposed infiltrate or effusion. Negative for mass or adenopathy. IMPRESSION: COPD, stable.  No superimposed acute abnormality. Electronically Signed   By: Franchot Gallo M.D.   On: 04/11/2016 08:08   Dg Chest Port 1 View  04/10/2016  CLINICAL DATA:  Shortness of Breath EXAM: PORTABLE CHEST 1 VIEW COMPARISON:  04/07/2016 FINDINGS: Cardiac shadow is stable. The lungs are well aerated bilaterally. No focal infiltrate or sizable effusion is seen. No acute bony abnormality is noted. IMPRESSION: No acute abnormality noted. Electronically Signed   By: Inez Catalina M.D.   On: 04/10/2016 08:09   Dg Chest Port 1 View  04/07/2016  CLINICAL DATA:  Worsening shortness of breath. EXAM: PORTABLE CHEST 1 VIEW COMPARISON:  Radiograph yesterday at 1832 hours. Chest CT 03/27/2016 FINDINGS: Diminished vascular congestion from prior exam. Improved left basilar aeration with minimal residual patchy opacity. The lingular opacity on CT is not well seen. Hyperinflation and emphysema. Biapical and mid lung zone scarring again seen. Cardiomediastinal contours are unchanged. IMPRESSION: Decreasing pulmonary vascular congestion. Lingular opacity on CT is not well seen. Minimal patchy left basilar  opacity, with mild improvement from prior radiograph. Electronically Signed   By: Jeb Levering M.D.   On: 04/07/2016 05:55   Dg Chest Portable 1 View  04/06/2016  CLINICAL DATA:  Status post fall, with worsening shortness of breath. Syncope and lower extremity weakness. Initial encounter. EXAM: PORTABLE CHEST 1 VIEW COMPARISON:  Chest radiograph performed 04/02/2016 FINDINGS: The lungs are well-aerated. Vascular congestion is noted. Mild left basilar airspace opacity could reflect pneumonia. Mild right midlung and right apical scarring is noted. There is no evidence of pleural effusion or pneumothorax. The cardiomediastinal silhouette is within normal limits. No acute osseous abnormalities are seen. IMPRESSION: Vascular congestion noted. Mild left basilar opacity could reflect pneumonia. Mild right midlung and right apical scarring noted. Electronically  Signed   By: Garald Balding M.D.   On: 04/06/2016 19:00   Dg Chest Port 1 View  04/02/2016  CLINICAL DATA:  Shortness of breath, emphysema, COPD EXAM: PORTABLE CHEST 1 VIEW COMPARISON:  03/31/2016 FINDINGS: The heart size and mediastinal contours are within normal limits. Both lungs are clear. The visualized skeletal structures are unremarkable. IMPRESSION: No active disease. Electronically Signed   By: Kathreen Devoid   On: 04/02/2016 13:14   Dg Chest Portable 1 View  03/19/2016  CLINICAL DATA:  Dyspnea, worsening over the past week. EXAM: PORTABLE CHEST 1 VIEW COMPARISON:  01/08/2016 FINDINGS: A single AP portable view of the chest demonstrates no focal airspace consolidation or alveolar edema. The lungs are markedly hyperinflated, with emphysematous changes. There is no large effusion or pneumothorax. Cardiac and mediastinal contours appear unremarkable. Pulmonary vasculature is normal. IMPRESSION: Hyperinflation and emphysematous disease. No superimposed acute findings. Electronically Signed   By: Andreas Newport M.D.   On: 03/19/2016 22:51   Dg Abd  Acute W/chest  04/13/2016  CLINICAL DATA:  Diarrhea, nausea, shortness of breath. Increased weakness for 1 day. EXAM: DG ABDOMEN ACUTE W/ 1V CHEST COMPARISON:  04/11/2016 FINDINGS: Gas within mildly prominent colon and nondilated small bowel loops. No free air organomegaly. Mild hyperinflation of the lungs compatible with COPD. Heart is normal size. Biapical scarring. No confluent opacities or effusions. No acute bony abnormality. IMPRESSION: No evidence of bowel obstruction or free air. COPD/chronic changes.  No active disease. Electronically Signed   By: Rolm Baptise M.D.   On: 04/13/2016 10:35   Dg Abd Portable 1v  04/10/2016  CLINICAL DATA:  Abdominal distension. EXAM: PORTABLE ABDOMEN - 1 VIEW COMPARISON:  CT, 04/07/2016 FINDINGS: There is increased gas in both the colon and small bowel with no significant bowel dilation. Some air is seen within the rectum. There are multiple surgical vascular clips along the abdominal midline. Vascular calcifications are noted along the aorta and iliac vessels. IMPRESSION: 1. No convincing bowel obstruction. 2. Increased gas noted within the small bowel and colon suggesting a mild diffuse adynamic ileus. Electronically Signed   By: Lajean Manes M.D.   On: 04/10/2016 14:13    Microbiology: Recent Results (from the past 240 hour(s))  Blood Culture (routine x 2)     Status: None   Collection Time: 04/06/16  6:40 PM  Result Value Ref Range Status   Specimen Description BLOOD RIGHT ANTECUBITAL  Final   Special Requests BOTTLES DRAWN AEROBIC AND ANAEROBIC 5CC EA  Final   Culture   Final    NO GROWTH 5 DAYS Performed at [facility]    Report Status 04/12/2016 FINAL  Final  Blood Culture (routine x 2)     Status: None   Collection Time: 04/06/16  7:00 PM  Result Value Ref Range Status   Specimen Description BLOOD LEFT WRIST  Final   Special Requests IN PEDIATRIC BOTTLE 5CC  Final   Culture   Final    NO GROWTH 5 DAYS Performed at [facility]     Report Status 04/12/2016 FINAL  Final  Urine culture     Status: None   Collection Time: 04/06/16  7:42 PM  Result Value Ref Range Status   Specimen Description URINE, CATHETERIZED  Final   Special Requests NONE  Final   Culture NO GROWTH Performed at [facility]   Final   Report Status 04/08/2016 FINAL  Final  MRSA PCR Screening     Status:  None   Collection Time: 04/06/16 10:21 PM  Result Value Ref Range Status   MRSA by PCR NEGATIVE NEGATIVE Final    Comment:        The GeneXpert MRSA Assay (FDA approved for NASAL specimens only), is one component of a comprehensive MRSA colonization surveillance program. It is not intended to diagnose MRSA infection nor to guide or monitor treatment for MRSA infections.      Labs: Basic Metabolic Panel:  Recent Labs Lab 04/10/16 0311 04/11/16 0328 04/12/16 0051 04/12/16 0055 04/13/16 0511  NA 138 138 138  --  139  K 3.4* 3.8 3.4*  --  4.5  CL 90* 90* 91*  --  95*  CO2 37* 38* 37*  --  36*  GLUCOSE 177* 148* 186*  --  165*  BUN 17 17 17   --  17  CREATININE 0.62 0.62 0.62  --  0.64  CALCIUM 8.4* 8.6* 8.2*  --  8.5*  MG  --   --   --  2.2  --    Liver Function Tests: No results for input(s): AST, ALT, ALKPHOS, BILITOT, PROT, ALBUMIN in the last 168 hours. No results for input(s): LIPASE, AMYLASE in the last 168 hours. No results for input(s): AMMONIA in the last 168 hours. CBC:  Recent Labs Lab 04/10/16 0311 04/11/16 0328 04/12/16 0051 04/13/16 0511  WBC 14.7* 12.8* 9.4 8.7  HGB 10.5* 11.7* 10.7* 10.7*  HCT 33.5* 35.7* 33.3* 33.6*  MCV 90.1 87.3 87.4 87.3  PLT 204 228 218 226   Cardiac Enzymes: No results for input(s): CKTOTAL, CKMB, CKMBINDEX, TROPONINI in the last 168 hours. BNP: BNP (last 3 results)  Recent Labs  01/08/16 1707 03/29/16 1718 04/06/16 1906  BNP 149.0* 73.0 140.0*    ProBNP (last 3 results) No results for input(s): PROBNP in the last 8760 hours.  CBG:  Recent Labs Lab  04/14/16 1201 04/14/16 1650 04/14/16 2143 04/15/16 0723 04/15/16 1129  GLUCAP 100* 174* 142* 97 152*       Signed:  Alanee Ting MD.  [facility] 04/15/2016, 11:51 AM

## 2016-04-15 NOTE — Care Management Note (Signed)
Case Management Note  Patient Details  Name: ULYSES TRICK MRN: [id number] Date of Birth: 1951-06-26  Subjective/Objective:MD notified of home rw order. [facility] rep Santiago Glad aware of [place] orders, & d/c. [place] dme rep [place] home rw order. Patient active w/home 02-[facility], has travel tank.                    Action/Plan:d/c home w/[facility]/dme.   Expected Discharge Date:   (unknown)               Expected Discharge Plan:  [place]  In-House Referral:     Discharge planning Services  CM Consult  Post Acute Care Choice:   (Active w/[facility] dme-home 02,has travel tank) Choice offered to:  Patient  DME Arranged:    DME Agency:  NA, [place] Arranged:  NA, RN, PT, OT, Nurse's Aide, Social Work [employer] Agency:  NA, [place]  Status of Service:  Completed, signed off  Medicare Important Message Given:  Yes Date Medicare IM Given:    Medicare IM give by:    Date Additional Medicare IM Given:    Additional Medicare Important Message give by:     If discussed at [place] of Stay Meetings, dates discussed:    Additional Comments:  Dessa Phi, RN 04/15/2016, 12:02 PM

## 2016-04-17 ENCOUNTER — Other Ambulatory Visit: Payer: Self-pay | Admitting: *Deleted

## 2016-04-17 NOTE — Patient Outreach (Signed)
Initial transition of care call discharged 04-15-16  SUBJECTIVE: Spoke with patient. Patient reports he is doing well. He states Home care has started seeing him. He is able to verbalize his recent hopsitalization.Patient needs follow up appointment. He states he will make the appointment due to gettitng a time when convenient for him to go. He states he has transportation "if our car will run", if not he states he has other family or friends who can transport him without difficulty, he does not live in an area with public transportation and no transportation benefit offered with his Medicare.  Patient has his new prescriptions, he was instructed to stop gabapentin, but states he is still taking, RNCM reviewed discharge instructions around medications. He will check with primary regarding this medication.   ASSESSMENT: Able to complete Transition of Care assessment MEDICATION MANAGEMENT: he states he and his wife manage medications Patient was recently discharged from hospital and all medications have been reviewed. RNCM reviewed medications and instructions. COPD: patient denies any increased symptoms "no more than usual". Patient needs to see pulmonologist   PLAN: Plan to call again next week and continue transition of care and schedule initial home visit Patient to call MD office for appointment, needs to get referral to [place]. Laymond Purser, RN, BSN, [place] [phone]

## 2016-04-21 ENCOUNTER — Inpatient Hospital Stay (HOSPITAL_COMMUNITY)
Admission: EM | Admit: 2016-04-21 | Discharge: 2016-05-01 | DRG: 308 | Disposition: A | Discharge status: Home Health | Payer: Medicare Other | Attending: Internal Medicine | Admitting: Internal Medicine

## 2016-04-21 ENCOUNTER — Encounter (HOSPITAL_COMMUNITY): Payer: Self-pay | Admitting: Family Medicine

## 2016-04-21 ENCOUNTER — Emergency Department (HOSPITAL_COMMUNITY): Payer: Medicare Other

## 2016-04-21 DIAGNOSIS — K219 Gastro-esophageal reflux disease without esophagitis: Secondary | ICD-10-CM | POA: Diagnosis present

## 2016-04-21 DIAGNOSIS — E872 Acidosis: Secondary | ICD-10-CM | POA: Diagnosis present

## 2016-04-21 DIAGNOSIS — I481 Persistent atrial fibrillation: Secondary | ICD-10-CM | POA: Diagnosis not present

## 2016-04-21 DIAGNOSIS — E1165 Type 2 diabetes mellitus with hyperglycemia: Secondary | ICD-10-CM | POA: Diagnosis present

## 2016-04-21 DIAGNOSIS — Z6823 Body mass index (BMI) 23.0-23.9, adult: Secondary | ICD-10-CM | POA: Diagnosis not present

## 2016-04-21 DIAGNOSIS — Z7901 Long term (current) use of anticoagulants: Secondary | ICD-10-CM

## 2016-04-21 DIAGNOSIS — I2510 Atherosclerotic heart disease of native coronary artery without angina pectoris: Secondary | ICD-10-CM | POA: Diagnosis present

## 2016-04-21 DIAGNOSIS — I959 Hypotension, unspecified: Secondary | ICD-10-CM | POA: Diagnosis present

## 2016-04-21 DIAGNOSIS — Z09 Encounter for follow-up examination after completed treatment for conditions other than malignant neoplasm: Secondary | ICD-10-CM

## 2016-04-21 DIAGNOSIS — R748 Abnormal levels of other serum enzymes: Secondary | ICD-10-CM | POA: Diagnosis present

## 2016-04-21 DIAGNOSIS — Z7984 Long term (current) use of oral hypoglycemic drugs: Secondary | ICD-10-CM | POA: Diagnosis not present

## 2016-04-21 DIAGNOSIS — I4891 Unspecified atrial fibrillation: Secondary | ICD-10-CM | POA: Diagnosis present

## 2016-04-21 DIAGNOSIS — J9621 Acute and chronic respiratory failure with hypoxia: Secondary | ICD-10-CM | POA: Diagnosis present

## 2016-04-21 DIAGNOSIS — E43 Unspecified severe protein-calorie malnutrition: Secondary | ICD-10-CM | POA: Diagnosis present

## 2016-04-21 DIAGNOSIS — J9611 Chronic respiratory failure with hypoxia: Secondary | ICD-10-CM | POA: Diagnosis not present

## 2016-04-21 DIAGNOSIS — J9610 Chronic respiratory failure, unspecified whether with hypoxia or hypercapnia: Secondary | ICD-10-CM | POA: Diagnosis present

## 2016-04-21 DIAGNOSIS — J9600 Acute respiratory failure, unspecified whether with hypoxia or hypercapnia: Secondary | ICD-10-CM

## 2016-04-21 DIAGNOSIS — I5032 Chronic diastolic (congestive) heart failure: Secondary | ICD-10-CM | POA: Diagnosis present

## 2016-04-21 DIAGNOSIS — E860 Dehydration: Secondary | ICD-10-CM | POA: Diagnosis present

## 2016-04-21 DIAGNOSIS — E785 Hyperlipidemia, unspecified: Secondary | ICD-10-CM | POA: Diagnosis present

## 2016-04-21 DIAGNOSIS — J9602 Acute respiratory failure with hypercapnia: Secondary | ICD-10-CM | POA: Diagnosis not present

## 2016-04-21 DIAGNOSIS — G9341 Metabolic encephalopathy: Secondary | ICD-10-CM | POA: Diagnosis present

## 2016-04-21 DIAGNOSIS — T17908A Unspecified foreign body in respiratory tract, part unspecified causing other injury, initial encounter: Secondary | ICD-10-CM | POA: Insufficient documentation

## 2016-04-21 DIAGNOSIS — E871 Hypo-osmolality and hyponatremia: Secondary | ICD-10-CM | POA: Diagnosis present

## 2016-04-21 DIAGNOSIS — J449 Chronic obstructive pulmonary disease, unspecified: Secondary | ICD-10-CM | POA: Diagnosis present

## 2016-04-21 DIAGNOSIS — J441 Chronic obstructive pulmonary disease with (acute) exacerbation: Secondary | ICD-10-CM | POA: Diagnosis present

## 2016-04-21 DIAGNOSIS — R4182 Altered mental status, unspecified: Secondary | ICD-10-CM | POA: Diagnosis not present

## 2016-04-21 DIAGNOSIS — E876 Hypokalemia: Secondary | ICD-10-CM | POA: Diagnosis present

## 2016-04-21 DIAGNOSIS — K529 Noninfective gastroenteritis and colitis, unspecified: Secondary | ICD-10-CM | POA: Diagnosis present

## 2016-04-21 DIAGNOSIS — J9601 Acute respiratory failure with hypoxia: Secondary | ICD-10-CM | POA: Diagnosis not present

## 2016-04-21 DIAGNOSIS — I480 Paroxysmal atrial fibrillation: Secondary | ICD-10-CM | POA: Diagnosis present

## 2016-04-21 DIAGNOSIS — D649 Anemia, unspecified: Secondary | ICD-10-CM | POA: Diagnosis present

## 2016-04-21 DIAGNOSIS — G894 Chronic pain syndrome: Secondary | ICD-10-CM | POA: Diagnosis present

## 2016-04-21 DIAGNOSIS — J9622 Acute and chronic respiratory failure with hypercapnia: Secondary | ICD-10-CM | POA: Diagnosis present

## 2016-04-21 DIAGNOSIS — Z720 Tobacco use: Secondary | ICD-10-CM | POA: Diagnosis present

## 2016-04-21 DIAGNOSIS — Z7951 Long term (current) use of inhaled steroids: Secondary | ICD-10-CM

## 2016-04-21 DIAGNOSIS — F17210 Nicotine dependence, cigarettes, uncomplicated: Secondary | ICD-10-CM | POA: Diagnosis present

## 2016-04-21 DIAGNOSIS — R1310 Dysphagia, unspecified: Secondary | ICD-10-CM | POA: Diagnosis present

## 2016-04-21 DIAGNOSIS — R400 Somnolence: Secondary | ICD-10-CM | POA: Diagnosis not present

## 2016-04-21 DIAGNOSIS — R0600 Dyspnea, unspecified: Secondary | ICD-10-CM | POA: Insufficient documentation

## 2016-04-21 DIAGNOSIS — J438 Other emphysema: Secondary | ICD-10-CM | POA: Diagnosis not present

## 2016-04-21 DIAGNOSIS — Z9229 Personal history of other drug therapy: Secondary | ICD-10-CM

## 2016-04-21 DIAGNOSIS — I482 Chronic atrial fibrillation: Secondary | ICD-10-CM | POA: Diagnosis present

## 2016-04-21 DIAGNOSIS — Z9981 Dependence on supplemental oxygen: Secondary | ICD-10-CM | POA: Diagnosis not present

## 2016-04-21 DIAGNOSIS — I739 Peripheral vascular disease, unspecified: Secondary | ICD-10-CM | POA: Diagnosis present

## 2016-04-21 DIAGNOSIS — Z79899 Other long term (current) drug therapy: Secondary | ICD-10-CM

## 2016-04-21 DIAGNOSIS — Z95828 Presence of other vascular implants and grafts: Secondary | ICD-10-CM

## 2016-04-21 DIAGNOSIS — T17998D Other foreign object in respiratory tract, part unspecified causing other injury, subsequent encounter: Secondary | ICD-10-CM | POA: Diagnosis not present

## 2016-04-21 LAB — CBC WITH DIFFERENTIAL/PLATELET
Basophils Absolute: 0 10*3/uL (ref 0.0–0.1)
Basophils Relative: 0 %
Eosinophils Absolute: 0 10*3/uL (ref 0.0–0.7)
Eosinophils Relative: 0 %
HCT: 30.8 % — ABNORMAL LOW (ref 39.0–52.0)
Hemoglobin: 9.9 g/dL — ABNORMAL LOW (ref 13.0–17.0)
Lymphocytes Relative: 7 %
Lymphs Abs: 0.8 10*3/uL (ref 0.7–4.0)
MCH: 28 pg (ref 26.0–34.0)
MCHC: 32.1 g/dL (ref 30.0–36.0)
MCV: 87 fL (ref 78.0–100.0)
Monocytes Absolute: 1.7 10*3/uL — ABNORMAL HIGH (ref 0.1–1.0)
Monocytes Relative: 14 %
Neutro Abs: 9.7 10*3/uL — ABNORMAL HIGH (ref 1.7–7.7)
Neutrophils Relative %: 79 %
Platelets: 207 10*3/uL (ref 150–400)
RBC: 3.54 MIL/uL — ABNORMAL LOW (ref 4.22–5.81)
RDW: 15.1 % (ref 11.5–15.5)
WBC: 12.1 10*3/uL — ABNORMAL HIGH (ref 4.0–10.5)

## 2016-04-21 LAB — BASIC METABOLIC PANEL
Anion gap: 11 (ref 5–15)
BUN: 12 mg/dL (ref 6–20)
CO2: 29 mmol/L (ref 22–32)
Calcium: 7.7 mg/dL — ABNORMAL LOW (ref 8.9–10.3)
Chloride: 92 mmol/L — ABNORMAL LOW (ref 101–111)
Creatinine, Ser: 0.98 mg/dL (ref 0.61–1.24)
GFR calc Af Amer: >60 mL/min (ref >60)
GFR calc non Af Amer: >60 mL/min (ref >60)
Glucose, Bld: 137 mg/dL — ABNORMAL HIGH (ref 65–99)
Potassium: 3.3 mmol/L — ABNORMAL LOW (ref 3.5–5.1)
Sodium: 132 mmol/L — ABNORMAL LOW (ref 135–145)

## 2016-04-21 LAB — MAGNESIUM: Magnesium: 1.5 mg/dL — ABNORMAL LOW (ref 1.7–2.4)

## 2016-04-21 LAB — TROPONIN I
Troponin I: 0.07 ng/mL — ABNORMAL HIGH (ref <0.031)
Troponin I: 0.06 ng/mL — ABNORMAL HIGH (ref <0.031)

## 2016-04-21 LAB — DIGOXIN LEVEL: Digoxin Level: <0.2 ng/mL — ABNORMAL LOW (ref 0.8–2.0)

## 2016-04-21 MED ORDER — IPRATROPIUM-ALBUTEROL 0.5-2.5 (3) MG/3ML IN SOLN
3.0000 mL | Freq: Once | RESPIRATORY_TRACT | Status: AC
  Administered 2016-04-21: 3 mL via RESPIRATORY_TRACT
  Filled 2016-04-21: qty 3

## 2016-04-21 MED ORDER — HYDROCODONE-ACETAMINOPHEN 5-325 MG PO TABS
1.0000 | ORAL_TABLET | Freq: Once | ORAL | Status: AC
  Administered 2016-04-21: 1 via ORAL
  Filled 2016-04-21: qty 1

## 2016-04-21 MED ORDER — INSULIN ASPART 100 UNIT/ML ~~LOC~~ SOLN
0.0000 [IU] | Freq: Three times a day (TID) | SUBCUTANEOUS | Status: DC
  Administered 2016-04-22 (×2): 2 [IU] via SUBCUTANEOUS
  Administered 2016-04-22: 3 [IU] via SUBCUTANEOUS
  Administered 2016-04-23: 2 [IU] via SUBCUTANEOUS
  Administered 2016-04-23 (×2): 3 [IU] via SUBCUTANEOUS
  Administered 2016-04-24: 5 [IU] via SUBCUTANEOUS
  Administered 2016-04-24 – 2016-04-26 (×5): 3 [IU] via SUBCUTANEOUS
  Administered 2016-04-26: 5 [IU] via SUBCUTANEOUS
  Administered 2016-04-26: 3 [IU] via SUBCUTANEOUS
  Administered 2016-04-27: 5 [IU] via SUBCUTANEOUS
  Administered 2016-04-27: 3 [IU] via SUBCUTANEOUS
  Administered 2016-04-27 – 2016-04-28 (×2): 5 [IU] via SUBCUTANEOUS
  Administered 2016-04-28 (×2): 3 [IU] via SUBCUTANEOUS
  Administered 2016-04-29: 5 [IU] via SUBCUTANEOUS
  Administered 2016-04-29 – 2016-04-30 (×3): 3 [IU] via SUBCUTANEOUS
  Administered 2016-04-30: 2 [IU] via SUBCUTANEOUS
  Administered 2016-04-30: 5 [IU] via SUBCUTANEOUS
  Administered 2016-05-01: 3 [IU] via SUBCUTANEOUS
  Administered 2016-05-01: 2 [IU] via SUBCUTANEOUS

## 2016-04-21 MED ORDER — DIGOXIN 125 MCG PO TABS
0.1250 mg | ORAL_TABLET | Freq: Every day | ORAL | Status: DC
  Administered 2016-04-22 – 2016-04-24 (×3): 0.125 mg via ORAL
  Filled 2016-04-21 (×4): qty 1

## 2016-04-21 MED ORDER — MAGNESIUM SULFATE 2 GM/50ML IV SOLN
2.0000 g | Freq: Once | INTRAVENOUS | Status: AC
  Administered 2016-04-21: 2 g via INTRAVENOUS
  Filled 2016-04-21: qty 50

## 2016-04-21 MED ORDER — ONDANSETRON HCL 4 MG/2ML IJ SOLN: 4.0000 mg | Freq: Four times a day (QID) | INTRAMUSCULAR | Status: DC | PRN

## 2016-04-21 MED ORDER — FUROSEMIDE 20 MG PO TABS
20.0000 mg | ORAL_TABLET | Freq: Every day | ORAL | Status: DC
  Administered 2016-04-22: 20 mg via ORAL
  Filled 2016-04-21: qty 1

## 2016-04-21 MED ORDER — NITROGLYCERIN 0.4 MG SL SUBL: 0.4000 mg | SUBLINGUAL_TABLET | SUBLINGUAL | Status: DC | PRN

## 2016-04-21 MED ORDER — METOPROLOL TARTRATE 50 MG PO TABS
50.0000 mg | ORAL_TABLET | Freq: Two times a day (BID) | ORAL | Status: DC
  Administered 2016-04-22 – 2016-04-24 (×5): 50 mg via ORAL
  Filled 2016-04-21 (×6): qty 1

## 2016-04-21 MED ORDER — IPRATROPIUM-ALBUTEROL 0.5-2.5 (3) MG/3ML IN SOLN
3.0000 mL | Freq: Once | RESPIRATORY_TRACT | Status: AC
  Administered 2016-04-21: 3 mL via RESPIRATORY_TRACT

## 2016-04-21 MED ORDER — IPRATROPIUM-ALBUTEROL 0.5-2.5 (3) MG/3ML IN SOLN
RESPIRATORY_TRACT | Status: AC
  Filled 2016-04-21: qty 3

## 2016-04-21 MED ORDER — SIMVASTATIN 40 MG PO TABS
40.0000 mg | ORAL_TABLET | Freq: Every day | ORAL | Status: DC
  Administered 2016-04-22 – 2016-04-24 (×2): 40 mg via ORAL
  Filled 2016-04-21 (×2): qty 1

## 2016-04-21 MED ORDER — APIXABAN 5 MG PO TABS
5.0000 mg | ORAL_TABLET | Freq: Two times a day (BID) | ORAL | Status: DC
  Administered 2016-04-21 – 2016-05-01 (×20): 5 mg via ORAL
  Filled 2016-04-21 (×3): qty 1
  Filled 2016-04-21: qty 2
  Filled 2016-04-21 (×17): qty 1

## 2016-04-21 MED ORDER — DILTIAZEM HCL 100 MG IV SOLR
5.0000 mg/h | INTRAVENOUS | Status: DC
  Administered 2016-04-21: 5 mg/h via INTRAVENOUS
  Administered 2016-04-21 – 2016-04-24 (×6): 10 mg/h via INTRAVENOUS
  Filled 2016-04-21 (×6): qty 100

## 2016-04-21 MED ORDER — PANTOPRAZOLE SODIUM 40 MG PO TBEC
40.0000 mg | DELAYED_RELEASE_TABLET | Freq: Every day | ORAL | Status: DC
  Administered 2016-04-22 – 2016-05-01 (×9): 40 mg via ORAL
  Filled 2016-04-21 (×9): qty 1

## 2016-04-21 MED ORDER — SODIUM CHLORIDE 0.9 % IV BOLUS (SEPSIS)
500.0000 mL | Freq: Once | INTRAVENOUS | Status: AC
  Administered 2016-04-21: 500 mL via INTRAVENOUS

## 2016-04-21 MED ORDER — GABAPENTIN 300 MG PO CAPS
300.0000 mg | ORAL_CAPSULE | Freq: Three times a day (TID) | ORAL | Status: DC
  Administered 2016-04-21 – 2016-05-01 (×27): 300 mg via ORAL
  Filled 2016-04-21 (×28): qty 1

## 2016-04-21 MED ORDER — ACETAMINOPHEN 325 MG PO TABS
650.0000 mg | ORAL_TABLET | ORAL | Status: DC | PRN
  Administered 2016-04-22 – 2016-04-24 (×3): 650 mg via ORAL
  Filled 2016-04-21 (×3): qty 2

## 2016-04-21 MED ORDER — IPRATROPIUM BROMIDE 0.02 % IN SOLN: 0.5000 mg | RESPIRATORY_TRACT | Status: DC | PRN

## 2016-04-21 MED ORDER — ALPRAZOLAM 0.25 MG PO TABS
0.2500 mg | ORAL_TABLET | Freq: Three times a day (TID) | ORAL | Status: DC | PRN
  Administered 2016-04-22 – 2016-04-23 (×4): 0.25 mg via ORAL
  Filled 2016-04-21 (×4): qty 1

## 2016-04-21 MED ORDER — DILTIAZEM LOAD VIA INFUSION
15.0000 mg | Freq: Once | INTRAVENOUS | Status: AC
  Administered 2016-04-21: 15 mg via INTRAVENOUS
  Filled 2016-04-21: qty 15

## 2016-04-21 NOTE — ED Notes (Signed)
Gave report to [facility] RN with [facility].

## 2016-04-21 NOTE — ED Notes (Signed)
Patient is from home and transported via [facility]. Patient was admitted to [facility] on May 28 for Sepsis and PNA. Discharged on June 6. Patient was receiving physical therapy today at home and attempting to walk from bed to bathroom but had to sit down more than usual. PT notified home health nurse about patients condition. She came out to evaluate patient and called for emergency services. Patient has hypotension with upper and lower extremity swelling. Also, patient is intermittently in A-fib with a heart rate of 110-200 but usually stays in a rate of 120-150 during transport. Pt does wear 3L of oxygen at home but has been increased up to 4L with EMS. Since being discharged patient has not took Lasix until today.

## 2016-04-21 NOTE — ED Notes (Signed)
Bed: RESB Expected date:  Expected time:  Means of arrival:  Comments: EMS 69 M - low BP, tachycardic

## 2016-04-21 NOTE — ED Provider Notes (Signed)
Care assumed from Dr. Wilson Singer at 1800 with plan for admit following labs.   Results:  BP 90/64 mmHg  Pulse 105  Temp(Src) 97.5 F (36.4 C) (Oral)  Resp 16  SpO2 100%  Results for orders placed or performed during the hospital encounter of 04/21/16  CBC with Differential  Result Value Ref Range   WBC 12.1 (H) 4.0 - 10.5 K/uL   RBC 3.54 (L) 4.22 - 5.81 MIL/uL   Hemoglobin 9.9 (L) 13.0 - 17.0 g/dL   HCT 30.8 (L) 39.0 - 52.0 %   MCV 87.0 78.0 - 100.0 fL   MCH 28.0 26.0 - 34.0 pg   MCHC 32.1 30.0 - 36.0 g/dL   RDW 15.1 11.5 - 15.5 %   Platelets 207 150 - 400 K/uL   Neutrophils Relative % 79 %   Neutro Abs 9.7 (H) 1.7 - 7.7 K/uL   Lymphocytes Relative 7 %   Lymphs Abs 0.8 0.7 - 4.0 K/uL   Monocytes Relative 14 %   Monocytes Absolute 1.7 (H) 0.1 - 1.0 K/uL   Eosinophils Relative 0 %   Eosinophils Absolute 0.0 0.0 - 0.7 K/uL   Basophils Relative 0 %   Basophils Absolute 0.0 0.0 - 0.1 K/uL  Basic metabolic panel  Result Value Ref Range   Sodium 132 (L) 135 - 145 mmol/L   Potassium 3.3 (L) 3.5 - 5.1 mmol/L   Chloride 92 (L) 101 - 111 mmol/L   CO2 29 22 - 32 mmol/L   Glucose, Bld 137 (H) 65 - 99 mg/dL   BUN 12 6 - 20 mg/dL   Creatinine, Ser 0.98 0.61 - 1.24 mg/dL   Calcium 7.7 (L) 8.9 - 10.3 mg/dL   GFR calc non Af Amer >60 >60 mL/min   GFR calc Af Amer >60 >60 mL/min   Anion gap 11 5 - 15  Troponin I  Result Value Ref Range   Troponin I 0.07 (H) <0.031 ng/mL  Magnesium  Result Value Ref Range   Magnesium 1.5 (L) 1.7 - 2.4 mg/dL  Digoxin level  Result Value Ref Range   Digoxin Level <0.2 (L) 0.8 - 2.0 ng/mL    Dg Chest Portable 1 View  04/21/2016  CLINICAL DATA:  Dyspnea EXAM: PORTABLE CHEST 1 VIEW COMPARISON:  04/13/2016 FINDINGS: Cardiac shadow is stable. Mild interstitial changes are noted somewhat accentuated by the portable technique. No focal confluent infiltrate or sizable effusion is noted. No bony abnormality is seen. IMPRESSION: No change from the previous exam.  Electronically Signed   By: Inez Catalina M.D.   On: 04/21/2016 16:46    Radiology and laboratory examinations were reviewed by me and used in medical decision making if performed.   MDM:  Pt was reassessed, remains mildly hypotensive on diltiazem infusion. Continues to wheeze. IVF bolus challenge for mild hypotension but appears to be perfusing. Has multiple comorbid medical conditions so would likely benefit from medical admission with cardiology availability. Hospitalist was consulted for admission and will see the patient in the emergency department for admission to [facility] for cardiology availability.   Diagnoses that have been ruled out:  None  Diagnoses that are still under consideration:  None  Final diagnoses:  Atrial fibrillation with rapid ventricular response (HCC)  Hypotension, unspecified hypotension type     Leo Grosser, MD 04/22/16 [phone]

## 2016-04-21 NOTE — ED Notes (Signed)
Transferred to [facility] via [place]

## 2016-04-21 NOTE — ED Notes (Signed)
Patient is sitting on side of bed with call bell within reach. Patient and family are aware of being transported to [facility]. [facility] has been notified and awaiting transportation.

## 2016-04-21 NOTE — ED Provider Notes (Signed)
CSN: [id number]     Arrival date & time 04/21/16  4 History   First MD Initiated Contact with Patient 04/21/16 1609     Chief Complaint  Patient presents with  . Hypotension  . Weakness     (Consider location/radiation/quality/duration/timing/severity/associated sxs/prior Treatment) HPI   65 year old male with generalized weakness. Onset this morning shortly after he woke up. Persistent since then. Feels like he has no energy. He has a hc of COPD with some baseline dyspnea. Chronic oxygen use. He feels like his breathing has been worse since this morning. No new cough. Denies any acute pain. On arrival to the emergency room he was in atrial fibrillation with rapid ventricular rate. Rate was from 160-200. He does have a past history of A. fib. He is on Eliquis, digoxin and metoprolol. He reports compliance with his medications. Recent admission 5/28-6/6 with sepsis secondary to pneumonia as well as colitis versus diverticulitis. During that admission he also had a mildly elevated troponin at 0.2 which trended down. ECHO on 5/29 with normal LV function; grade 1 diastolic dysfunction; trace MR and TR.  Past Medical History  Diagnosis Date  . GERD (gastroesophageal reflux disease)   . COPD (chronic obstructive pulmonary disease) ([place])     severe by PFTs August 2010  . 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 Atrial fibrillation (HCC)     post-op a-fib. 3/11. post Aortobifem , /  Atrial fibrillation from nebulizer treatment  May, 2012, while hospitalized  . Renal artery stenosis ([place])      presumably corrected at time of aortobifem surgery March, 2011  . Tobacco user      stop smoking  . Pneumothorax      2011  before aortobifemoral surgery,  resolved  . Peripheral vascular disease, unspecified ([place])     bilateral intermittent claudication  . CAD (coronary artery disease), native coronary artery      catheterization 06/2009.Marland KitchenMarland Kitchen70% proximal LAD and 30% proximal RCA. normal LV function, no intervention planned prior to surgery for aorta. LV normal function  by catheter 8/10.   [name] Dyslipidemia   . DJD (degenerative joint disease)   . Ejection fraction     60%, echo, May, 2012, rapid atrial fib at the time  . Cancer [facility]) 2013    Prostate Radiation  . Myocardial infarction ([place]) 2011  . On home O2     2L N/[place]  . Chronic thoracic back pain   . Chronic pain syndrome    Past Surgical History  Procedure Laterality Date  . Abdominal aortic aneurysm repair  01/11/2010  . Pr vein bypass graft,aorto-fem-pop    . Inguinal hernia repair      RIGHT   Family History  Problem Relation Age of Onset  . Cancer Father     prostate  . Heart attack Father    Social History  Substance Use Topics  . Smoking status: Former Smoker -- 2.00 packs/day for 40 years    Types: Cigarettes    Quit date: 01/11/2010  . Smokeless tobacco: Never Used  . Alcohol Use: No    Review of Systems  All systems reviewed and negative, other than as noted in HPI.   Allergies  Levaquin  Home Medications   Prior to Admission medications   Medication Sig Start Date End Date Taking? Authorizing Provider  acetaminophen (TYLENOL) 500 MG tablet Take 1,000  mg by mouth every 6 (six) hours as needed for mild pain.    Historical Provider, MD  albuterol (PROVENTIL HFA;VENTOLIN HFA) 108 (90 BASE) MCG/ACT inhaler Inhale 2 puffs into the lungs every 4 (four) hours as needed for wheezing or shortness of breath. 09/19/15   Donne [name], MD  albuterol (PROVENTIL) (2.5 MG/3ML) 0.083% nebulizer solution Take 3 mLs (2.5 mg total) by nebulization every 4 (four) hours as needed for wheezing or shortness of breath. 03/15/15   [name], MD  ALPRAZolam [name]) 0.25 MG tablet Take 0.25 mg by mouth at bedtime as needed for sleep.  12/17/15   Historical Provider, MD  ciprofloxacin (CIPRO) 250 MG tablet Take 1 tablet (250 mg total) by mouth  2 (two) times daily. For 5days 04/15/16   Domenic Polite, MD  digoxin (LANOXIN) 0.125 MG tablet Take 1 tablet (0.125 mg total) by mouth daily. Patient not taking: Reported on 03/27/2016 01/10/16   [name], MD  ELIQUIS 5 MG TABS tablet TAKE 1 TABLET BY MOUTH TWICE A DAY 02/11/16   Satira Sark, MD  furosemide (LASIX) 20 MG tablet Take 1 tablet (20 mg total) by mouth daily. For 3days then as needed for swelling/weight gain of >2lbs overnight or 4-5lbs in 1 week 04/15/16   Domenic Polite, MD  gabapentin (NEURONTIN) 300 MG capsule Take 300 mg by mouth 3 (three) times daily.    Historical Provider, MD  HYDROcodone-acetaminophen (NORCO/VICODIN) 5-325 MG tablet Take 1-2 tablets by mouth every 6 (six) hours as needed for moderate pain or severe pain. 04/15/16   Domenic Polite, MD  ipratropium (ATROVENT) 0.02 % nebulizer solution Take 0.5 mg by nebulization every 4 (four) hours as needed for wheezing or shortness of breath.    Historical Provider, MD  levalbuterol [facility] HFA) 45 MCG/ACT inhaler Inhale 2 puffs into the lungs every 4 (four) hours as needed for wheezing. Patient not taking: Reported on 04/17/2016 03/20/16   [name], DO  metFORMIN (GLUCOPHAGE) 1000 MG tablet Take 1,000 mg by mouth daily with breakfast.    Historical Provider, MD  metoprolol (LOPRESSOR) 50 MG tablet Take 50 mg by mouth 2 (two) times daily. 12/27/13   Historical Provider, MD  metroNIDAZOLE (FLAGYL) 250 MG tablet Take 1 tablet (250 mg total) by mouth every 8 (eight) hours. For 5days 04/15/16   Domenic Polite, MD  nitroGLYCERIN (NITROSTAT) 0.4 MG SL tablet Place 1 tablet (0.4 mg total) under the tongue every 5 (five) minutes x 3 doses as needed for chest pain. 06/11/15   [name], MD  omeprazole (PRILOSEC) 20 MG capsule Take 20 mg by mouth every morning.  04/21/11   Historical Provider, MD  oxyCODONE (OXYCONTIN) 15 mg 12 hr tablet Take 1 tablet (15 mg total) by mouth every 12 (twelve) hours. 04/02/16   Tanna Furry, MD   predniSONE (DELTASONE) 20 MG tablet Take 1-2 tablets (20-40 mg total) by mouth daily. Take 40mg  for 3days then 20mg  for 3days then STOP 04/15/16   Domenic Polite, MD  simvastatin (ZOCOR) 40 MG tablet TAKE 1 TABLET (40 MG TOTAL) BY MOUTH EVERY EVENING. 08/29/15   [name], MD   BP 94/78 mmHg  Pulse 98  Temp(Src) 98.3 F (36.8 C) (Oral)  Resp 14  SpO2 97% Physical Exam  Constitutional: He appears well-developed and well-nourished. No distress.  HENT:  Head: Normocephalic and atraumatic.  Eyes: Conjunctivae are normal. Right eye exhibits no discharge. Left eye exhibits no discharge.  Neck: Neck supple.  Cardiovascular:  Regular rhythm and normal heart sounds.  Exam reveals no gallop and no friction rub.   No murmur heard. Extremely tachycardic.  Hard to discern regularity. Looks like afib on monitor.   Pulmonary/Chest: Breath sounds normal.  Coarse breath sounds b/l  Abdominal: Soft. He exhibits no distension. There is no tenderness.  Musculoskeletal: He exhibits edema. He exhibits no tenderness.  Neurological: He is alert.  Skin: Skin is warm and dry.  Psychiatric: He has a normal mood and affect. His behavior is normal. Thought content normal.  Nursing note and vitals reviewed.   ED Course  Procedures (including critical care time) Labs Review Labs Reviewed  CBC WITH DIFFERENTIAL/PLATELET  BASIC METABOLIC PANEL  TROPONIN I  MAGNESIUM  DIGOXIN LEVEL    Imaging Review Dg Chest Portable 1 View  04/21/2016  CLINICAL DATA:  Dyspnea EXAM: PORTABLE CHEST 1 VIEW COMPARISON:  04/13/2016 FINDINGS: Cardiac shadow is stable. Mild interstitial changes are noted somewhat accentuated by the portable technique. No focal confluent infiltrate or sizable effusion is noted. No bony abnormality is seen. IMPRESSION: No change from the previous exam. Electronically Signed   By: Inez Catalina M.D.   On: 04/21/2016 16:46   I have personally reviewed and evaluated these images and lab results  as part of my medical decision-making.   EKG Interpretation   Date/Time:  Monday April 21 2016 16:03:24 EDT Ventricular Rate:  180 PR Interval:    QRS Duration: 114 QT Interval:  279 QTC Calculation: 483 R Axis:   21 Text Interpretation:  Atrial fibrillation with rapid V-rate Incomplete  right bundle branch block Confirmed by Wilson Singer  MD, Kiri Hinderliter ([id number]) on  04/21/2016 5:32:15 PM      MDM   Final diagnoses:  Atrial fibrillation with rapid ventricular response ([place])    65 year old male with generalized weakness. Likely secondary to atrial fibrillation with rapid ventricular rate. Arrived to the emergency room with a heart rate in the 160s-200s. He was placed on a Cardizem drip. His rate is sniffily improved. His symptoms are improved as well. His oxygen saturations are good on his home requirement. He does have some lower extremity edema which she reports is chronic/stable. His chest x-ray is without acute abnormality. Labs are pending.   Virgel Manifold, MD 05/02/16 2128

## 2016-04-21 NOTE — ED Notes (Signed)
Radiology at bedside

## 2016-04-21 NOTE — H&P (Signed)
- [facility] History and Physical  Kenneth Merritt [id number] DOB: 18-Aug-1951 DOA: 04/21/2016  Referring physician: none PCP: Neale Burly, MD   Chief Complaint: "I just felt weak."  HPI: Kenneth Merritt is a 65 y.o. male with past medical history significant for A. fib and coronary artery disease presented to the emergency room with chief complaint of shortness of breath of note the patient had been recently discharged from the hospital with a diagnosis of colitis. Patient states that after that leaving the hospital he never quite got her strength back. He has continued to have diarrhea over the last few days mostly watery. The frequency has not decreased. Denies any blood in the stool. Patient has been trying to keep up with his GI losses with oral intake. Patient states he's been taken his medication for his colitis as directed.  Denies any fevers, chills, nausea, rash.  Review of Systems:  As per HPI otherwise 10 point review of systems negative.   Past Medical History  Diagnosis Date  . GERD (gastroesophageal reflux disease)   . COPD (chronic obstructive pulmonary disease) ([place])     severe by PFTs August 2010  . 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 Atrial fibrillation (HCC)     post-op a-fib. 3/11. post Aortobifem , /  Atrial fibrillation from nebulizer treatment  May, 2012, while hospitalized  . Renal artery stenosis ([place])      presumably corrected at time of aortobifem surgery March, 2011  . Tobacco user      stop smoking  . Pneumothorax      2011  before aortobifemoral surgery,  resolved  . Peripheral vascular disease, unspecified ([place])     bilateral intermittent claudication  . CAD (coronary artery disease), native coronary artery     catheterization 06/2009.Marland KitchenMarland Kitchen70% proximal LAD and 30% proximal RCA. normal LV function, no intervention planned prior to surgery for aorta. LV  normal function  by catheter 8/10.   [name] Dyslipidemia   . DJD (degenerative joint disease)   . Ejection fraction     60%, echo, May, 2012, rapid atrial fib at the time  . Cancer [facility]) 2013    Prostate Radiation  . Myocardial infarction ([place]) 2011  . On home O2     2L N/C  . Chronic thoracic back pain   . Chronic pain syndrome    Past Surgical History  Procedure Laterality Date  . Abdominal aortic aneurysm repair  01/11/2010  . Pr vein bypass graft,aorto-fem-pop    . Inguinal hernia repair      RIGHT   Social History:  reports that he quit smoking about 6 years ago. His smoking use included Cigarettes. He has a 80 pack-year smoking history. He has never used smokeless tobacco. He reports that he does not drink alcohol or use illicit drugs.  Allergies  Allergen Reactions  . Levaquin [Levofloxacin In D5w] Other (See Comments)    Pt states it caused dry mouth and rectal bleeding.     Family History  Problem Relation Age of Onset  . Cancer Father     prostate  . Heart attack Father      Prior to Admission medications   Medication Sig Start Date End Date Taking? Authorizing Provider  acetaminophen (TYLENOL) 500 MG tablet Take 1,000 mg by mouth every 6 (six) hours as needed for mild pain.  Yes Historical Provider, MD  albuterol (PROVENTIL HFA;VENTOLIN HFA) 108 (90 BASE) MCG/ACT inhaler Inhale 2 puffs into the lungs every 4 (four) hours as needed for wheezing or shortness of breath. 09/19/15  Yes Donne [name], MD  albuterol (PROVENTIL) (2.5 MG/3ML) 0.083% nebulizer solution Take 3 mLs (2.5 mg total) by nebulization every 4 (four) hours as needed for wheezing or shortness of breath. 03/15/15  Yes [name], MD  ALPRAZolam [name]) 0.25 MG tablet Take 0.25 mg by mouth 3 (three) times daily as needed for anxiety or sleep.  12/17/15  Yes Historical Provider, MD  ciprofloxacin (CIPRO) 250 MG tablet Take 1 tablet (250 mg total) by mouth 2 (two) times daily. For 5days 04/15/16  Yes Domenic Polite, MD  digoxin (LANOXIN) 0.125 MG tablet Take 1 tablet (0.125 mg total) by mouth daily. 01/10/16  Yes [name], MD  ELIQUIS 5 MG TABS tablet TAKE 1 TABLET BY MOUTH TWICE A DAY 02/11/16  Yes Satira Sark, MD  furosemide (LASIX) 20 MG tablet Take 1 tablet (20 mg total) by mouth daily. For 3days then as needed for swelling/weight gain of >2lbs overnight or 4-5lbs in 1 week 04/15/16  Yes Domenic Polite, MD  gabapentin (NEURONTIN) 300 MG capsule Take 300 mg by mouth 3 (three) times daily.   Yes Historical Provider, MD  HYDROcodone-acetaminophen (NORCO/VICODIN) 5-325 MG tablet Take 1-2 tablets by mouth every 6 (six) hours as needed for moderate pain or severe pain. 04/15/16  Yes Domenic Polite, MD  ipratropium (ATROVENT) 0.02 % nebulizer solution Take 0.5 mg by nebulization every 4 (four) hours as needed for wheezing or shortness of breath.   Yes Historical Provider, MD  metoprolol (LOPRESSOR) 50 MG tablet Take 50 mg by mouth 2 (two) times daily. 12/27/13  Yes Historical Provider, MD  metroNIDAZOLE (FLAGYL) 250 MG tablet Take 1 tablet (250 mg total) by mouth every 8 (eight) hours. For 5days 04/15/16  Yes Domenic Polite, MD  nitroGLYCERIN (NITROSTAT) 0.4 MG SL tablet Place 1 tablet (0.4 mg total) under the tongue every 5 (five) minutes x 3 doses as needed for chest pain. 06/11/15  Yes [name], MD  omeprazole (PRILOSEC) 20 MG capsule Take 20 mg by mouth every morning.  04/21/11  Yes Historical Provider, MD  oxyCODONE (OXYCONTIN) 15 mg 12 hr tablet Take 1 tablet (15 mg total) by mouth every 12 (twelve) hours. 04/02/16  Yes Tanna Furry, MD  predniSONE (DELTASONE) 20 MG tablet Take 1-2 tablets (20-40 mg total) by mouth daily. Take 40mg  for 3days then 20mg  for 3days then STOP 04/15/16  Yes Domenic Polite, MD  simvastatin (ZOCOR) 40 MG tablet TAKE 1 TABLET (40 MG TOTAL) BY MOUTH EVERY EVENING. 08/29/15  Yes [name], MD  levalbuterol [facility] HFA) 45 MCG/ACT inhaler Inhale 2 puffs into the lungs  every 4 (four) hours as needed for wheezing. Patient not taking: Reported on 04/17/2016 03/20/16   [name], DO  metFORMIN (GLUCOPHAGE) 1000 MG tablet Take 1,000 mg by mouth daily with breakfast.    Historical Provider, MD   Physical Exam: Filed Vitals:   04/21/16 1908 04/21/16 1930 04/21/16 1945 04/21/16 2000  BP: 90/64 98/67 98/68  95/69  Pulse: 105 100 99 96  Temp:      TempSrc:      Resp: 16 13 18 12   SpO2: 100% 99% 100% 100%    Wt Readings from Last 3 Encounters:  04/15/16 60.9 kg (134 lb 4.2 oz)  04/02/16 65.772 kg (145 lb)  03/29/16 66.225 kg (146 lb)    General:  Appears calm and comfortable Eyes:  PERRL, EOMI, normal lids, iris ENT:  grossly normal hearing, lips & tongue Neck:  no LAD, masses or thyromegaly Cardiovascular:  RRR, no m/r/g. No LE edema.  Respiratory:  CTA bilaterally, no w/r/r. Normal respiratory effort. Abdomen:  soft, ntnd Skin:  no rash or induration seen on limited exam Musculoskeletal:  grossly normal tone BUE/BLE Psychiatric:  grossly normal mood and affect, speech fluent and appropriate Neurologic:  CN 2-12 grossly intact, moves all extremities in coordinated fashion.          Labs on Admission:  Basic Metabolic Panel:  Recent Labs Lab 04/21/16 1807  NA 132*  K 3.3*  CL 92*  CO2 29  GLUCOSE 137*  BUN 12  CREATININE 0.98  CALCIUM 7.7*  MG 1.5*   Liver Function Tests: No results for input(s): AST, ALT, ALKPHOS, BILITOT, PROT, ALBUMIN in the last 168 hours. No results for input(s): LIPASE, AMYLASE in the last 168 hours. No results for input(s): AMMONIA in the last 168 hours. CBC:  Recent Labs Lab 04/21/16 1807  WBC 12.1*  NEUTROABS 9.7*  HGB 9.9*  HCT 30.8*  MCV 87.0  PLT 207   Cardiac Enzymes:  Recent Labs Lab 04/21/16 1807  TROPONINI 0.07*    BNP (last 3 results)  Recent Labs  01/08/16 1707 03/29/16 1718 04/06/16 1906  BNP 149.0* 73.0 140.0*    ProBNP (last 3 results) No results for input(s): PROBNP  in the last 8760 hours.   CREATININE: 0.98 (04/21/16 1807) Estimated creatinine clearance - 63.8 mL/min  CBG:  Recent Labs Lab 04/14/16 2143 04/15/16 0723 04/15/16 1129  GLUCAP 142* 97 152*    Radiological Exams on Admission: Dg Chest Portable 1 View  04/21/2016  CLINICAL DATA:  Dyspnea EXAM: PORTABLE CHEST 1 VIEW COMPARISON:  04/13/2016 FINDINGS: Cardiac shadow is stable. Mild interstitial changes are noted somewhat accentuated by the portable technique. No focal confluent infiltrate or sizable effusion is noted. No bony abnormality is seen. IMPRESSION: No change from the previous exam. Electronically Signed   By: Inez Catalina M.D.   On: 04/21/2016 16:46    EKG: Independently reviewed. Afib with RVR.  Assessment/Plan Principal Problem:   Atrial fibrillation with RVR (HCC) Active Problems:   GERD (gastroesophageal reflux disease)   COPD (chronic obstructive pulmonary disease) (HCC)   Peripheral vascular disease, unspecified (HCC)   Dyslipidemia   Tobacco user   CAD (coronary artery disease), native coronary artery   Hyponatremia   Noninfectious gastroenteritis and colitis  Afib with RVR Pt converted to sinus rhythm by nurisng Diltiazem drip stopped Likely due to dehydration, low digoxin level and possible drug effect from Cipro Patient has been rehydrated and Cipro stopped only 1 dose was remaining Continue digoxin daily 0.125 mg Continue Eliquis Continue Lopressor 50 mg twice a day Hydration was done gently  Colitis We'll give patient last dose of Flagyl for his colitis, will consider imaging in the morning Tylenol for mild pain  Hyponatremia Mild without neurological effects Likely due to GI losses, with contribution of Lasix We'll recheck in a.m. likely corrected with fluid  Reflux PPI substituted with pantoprazole 40 mg daily  COPD Atrovent every 4 when necessary  Chronic pain Continue gabapentin  Elevated blood sugar Sliding scale insulin,  checking A1c  Hyperlipidemia Zocor 40 mg every afternoon  Peripheral arterial disease Tylenol when necessary  Tobacco user Advise smoking cessation  Code Status: full  DVT Prophylaxis:  eliqis Family Communication: family was at bedside Disposition Plan: Pending Improvement   Elwin Mocha, MD Family Medicine [facility] [email or web address] Password [email or web address]

## 2016-04-22 ENCOUNTER — Inpatient Hospital Stay (HOSPITAL_COMMUNITY): Payer: Medicare Other

## 2016-04-22 DIAGNOSIS — K529 Noninfective gastroenteritis and colitis, unspecified: Secondary | ICD-10-CM | POA: Diagnosis present

## 2016-04-22 DIAGNOSIS — E871 Hypo-osmolality and hyponatremia: Secondary | ICD-10-CM | POA: Diagnosis present

## 2016-04-22 DIAGNOSIS — E43 Unspecified severe protein-calorie malnutrition: Secondary | ICD-10-CM | POA: Diagnosis present

## 2016-04-22 LAB — GLUCOSE, CAPILLARY
GLUCOSE-CAPILLARY: 129 mg/dL — AB (ref 65–99)
GLUCOSE-CAPILLARY: 155 mg/dL — AB (ref 65–99)
GLUCOSE-CAPILLARY: 145 mg/dL — AB (ref 65–99)
Glucose-Capillary: 135 mg/dL — ABNORMAL HIGH (ref 65–99)

## 2016-04-22 LAB — CBC
HCT: 29.9 % — ABNORMAL LOW (ref 39.0–52.0)
Hemoglobin: 9.1 g/dL — ABNORMAL LOW (ref 13.0–17.0)
MCH: 26.8 pg (ref 26.0–34.0)
MCHC: 30.4 g/dL (ref 30.0–36.0)
MCV: 88.2 fL (ref 78.0–100.0)
PLATELETS: 199 10*3/uL (ref 150–400)
RBC: 3.39 MIL/uL — AB (ref 4.22–5.81)
RDW: 15.4 % (ref 11.5–15.5)
WBC: 8.8 10*3/uL (ref 4.0–10.5)

## 2016-04-22 LAB — BASIC METABOLIC PANEL
ANION GAP: 7 (ref 5–15)
BUN: 6 mg/dL (ref 6–20)
CALCIUM: 7.8 mg/dL — AB (ref 8.9–10.3)
CO2: 33 mmol/L — AB (ref 22–32)
CREATININE: 0.76 mg/dL (ref 0.61–1.24)
Chloride: 94 mmol/L — ABNORMAL LOW (ref 101–111)
GLUCOSE: 142 mg/dL — AB (ref 65–99)
Potassium: 3.1 mmol/L — ABNORMAL LOW (ref 3.5–5.1)
Sodium: 134 mmol/L — ABNORMAL LOW (ref 135–145)

## 2016-04-22 LAB — MRSA PCR SCREENING: MRSA BY PCR: NEGATIVE

## 2016-04-22 LAB — TROPONIN I: TROPONIN I: 0.05 ng/mL — AB (ref <0.031)

## 2016-04-22 MED ORDER — GUAIFENESIN ER 600 MG PO TB12
600.0000 mg | ORAL_TABLET | Freq: Two times a day (BID) | ORAL | Status: DC
  Filled 2016-04-22: qty 1

## 2016-04-22 MED ORDER — METRONIDAZOLE IN NACL 5-0.79 MG/ML-% IV SOLN
500.0000 mg | Freq: Three times a day (TID) | INTRAVENOUS | Status: AC
  Administered 2016-04-22: 500 mg via INTRAVENOUS
  Filled 2016-04-22: qty 100

## 2016-04-22 MED ORDER — ENSURE ENLIVE PO LIQD
237.0000 mL | ORAL | Status: DC
  Administered 2016-04-24: 237 mL via ORAL

## 2016-04-22 MED ORDER — IPRATROPIUM-ALBUTEROL 0.5-2.5 (3) MG/3ML IN SOLN
3.0000 mL | RESPIRATORY_TRACT | Status: DC | PRN
  Administered 2016-04-22 (×2): 3 mL via RESPIRATORY_TRACT
  Filled 2016-04-22 (×2): qty 3

## 2016-04-22 MED ORDER — IPRATROPIUM-ALBUTEROL 0.5-2.5 (3) MG/3ML IN SOLN
3.0000 mL | Freq: Four times a day (QID) | RESPIRATORY_TRACT | Status: DC
  Administered 2016-04-22 – 2016-04-23 (×3): 3 mL via RESPIRATORY_TRACT
  Filled 2016-04-22 (×2): qty 3

## 2016-04-22 MED ORDER — IPRATROPIUM-ALBUTEROL 0.5-2.5 (3) MG/3ML IN SOLN
RESPIRATORY_TRACT | Status: AC
  Filled 2016-04-22: qty 3

## 2016-04-22 MED ORDER — SODIUM CHLORIDE 0.9 % IV BOLUS (SEPSIS)
500.0000 mL | Freq: Once | INTRAVENOUS | Status: AC
  Administered 2016-04-22: 500 mL via INTRAVENOUS

## 2016-04-22 MED ORDER — GUAIFENESIN-DM 100-10 MG/5ML PO SYRP
5.0000 mL | ORAL_SOLUTION | ORAL | Status: DC | PRN
  Administered 2016-04-22 – 2016-04-26 (×3): 5 mL via ORAL
  Filled 2016-04-22 (×3): qty 5

## 2016-04-22 MED ORDER — POTASSIUM CHLORIDE 10 MEQ/100ML IV SOLN
INTRAVENOUS | Status: AC
  Administered 2016-04-22: 10 meq
  Filled 2016-04-22: qty 100

## 2016-04-22 MED ORDER — STARCH (THICKENING) PO POWD
ORAL | Status: DC | PRN
  Filled 2016-04-22: qty 227

## 2016-04-22 MED ORDER — POTASSIUM CHLORIDE CRYS ER 20 MEQ PO TBCR
40.0000 meq | EXTENDED_RELEASE_TABLET | Freq: Two times a day (BID) | ORAL | Status: DC
  Filled 2016-04-22: qty 2

## 2016-04-22 MED ORDER — ADULT MULTIVITAMIN W/MINERALS CH
1.0000 | ORAL_TABLET | Freq: Every day | ORAL | Status: DC
  Administered 2016-04-22 – 2016-05-01 (×9): 1 via ORAL
  Filled 2016-04-22 (×11): qty 1

## 2016-04-22 MED ORDER — DIGOXIN 0.25 MG/ML IJ SOLN
0.2500 mg | Freq: Once | INTRAMUSCULAR | Status: AC
  Administered 2016-04-22: 0.25 mg via INTRAVENOUS
  Filled 2016-04-22: qty 2

## 2016-04-22 MED ORDER — IPRATROPIUM-ALBUTEROL 0.5-2.5 (3) MG/3ML IN SOLN
3.0000 mL | Freq: Once | RESPIRATORY_TRACT | Status: AC
  Administered 2016-04-22: 3 mL via RESPIRATORY_TRACT

## 2016-04-22 MED ORDER — POTASSIUM CHLORIDE 10 MEQ/100ML IV SOLN
10.0000 meq | INTRAVENOUS | Status: AC
  Administered 2016-04-22 (×3): 10 meq via INTRAVENOUS
  Filled 2016-04-22 (×2): qty 100

## 2016-04-22 MED ORDER — BOOST / RESOURCE BREEZE PO LIQD
1.0000 | Freq: Two times a day (BID) | ORAL | Status: DC
  Administered 2016-04-22: 1 via ORAL

## 2016-04-22 NOTE — Evaluation (Signed)
Clinical/Bedside Swallow Evaluation Patient Details  Name: Kenneth Merritt MRN: [id number] Date of Birth: [birth year]  Today's Date: 04/22/2016 Time: SLP Start Time (ACUTE ONLY): [id number] SLP Stop Time (ACUTE ONLY): 1405 SLP Time Calculation (min) (ACUTE ONLY): 20 min  Past Medical History:  Past Medical History  Diagnosis Date  . GERD (gastroesophageal reflux disease)   . COPD (chronic obstructive pulmonary disease) ([place])     severe by PFTs August 2010  . 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 Atrial fibrillation (HCC)     post-op a-fib. 3/11. post Aortobifem , /  Atrial fibrillation from nebulizer treatment  May, 2012, while hospitalized  . Renal artery stenosis ([place])      presumably corrected at time of aortobifem surgery March, 2011  . Tobacco user      stop smoking  . Pneumothorax      2011  before aortobifemoral surgery,  resolved  . Peripheral vascular disease, unspecified ([place])     bilateral intermittent claudication  . CAD (coronary artery disease), native coronary artery     catheterization 06/2009.Marland KitchenMarland Kitchen70% proximal LAD and 30% proximal RCA. normal LV function, no intervention planned prior to surgery for aorta. LV normal function  by catheter 8/10.   [name] Dyslipidemia   . DJD (degenerative joint disease)   . Ejection fraction     60%, echo, May, 2012, rapid atrial fib at the time  . Cancer [facility]) 2013    Prostate Radiation  . Myocardial infarction ([place]) 2011  . On home O2     2L N/C  . Chronic thoracic back pain   . Chronic pain syndrome    Past Surgical History:  Past Surgical History  Procedure Laterality Date  . Abdominal aortic aneurysm repair  01/11/2010  . Pr vein bypass graft,aorto-fem-pop    . Inguinal hernia repair      RIGHT   HPI:  Patient with a long history of respiratory issues and reports new onset of difficulty swallowing   Assessment / Plan / Recommendation Clinical  Impression  Bedside Swallow Evaluation complete. Patient with a unremarkable oral motor exam and oral phase appears to be impacted most by patient being edentulous.  However, patient reports that foods, meds and liquids are making him feel choked at times.  Thin liquid sips resulted in suspected delayed in swallow, multiple swallows and consistent cough following trials despite attempts to compensate with portion control and pacing.  Nectar-thick liquids and pureed boluses resulted in fewer swallows and intermittent coughs.  Unable to clinically differentiate penetration/aspiration at bedside given impact of baseline congested cough.  Given history of respiratory issues along with today's bedside recommend an objective assessment next day to ensure safest, least restrictive diet.  Over night recommend Dys.1 textures and nectar-thick liquids with full supervision.  Discussed recommendations with patient and RN.      Aspiration Risk  Moderate aspiration risk    Diet Recommendation Dysphagia 1 (Puree);Nectar-thick liquid   Liquid Administration via: Cup;No straw Medication Administration: Crushed with puree Supervision: Patient able to self feed;Full supervision/cueing for compensatory strategies Compensations: Minimize environmental distractions;Slow rate;Small sips/bites;Multiple dry swallows after each bite/sip Postural Changes: Seated upright at 90 degrees    Other  Recommendations Oral Care Recommendations: Oral care BID Other Recommendations: Order thickener from pharmacy;Prohibited food (jello, ice cream, thin soups);Remove water pitcher;Have oral suction available   Follow up Recommendations  Other (  comment) (TBD following objective assessment 04/23/16)    Frequency and Duration min 2x/week  2 weeks       Prognosis Prognosis for Safe Diet Advancement: Fair      Swallow Study   General HPI: Patient with a long history of respiratory issues and reports new onset of difficulty  swallowing Type of Study: Bedside Swallow Evaluation Previous Swallow Assessment: none on record Diet Prior to this Study: Regular;Thin liquids Temperature Spikes Noted: No Respiratory Status: Nasal cannula History of Recent Intubation: No Behavior/Cognition: Alert;Cooperative;Pleasant mood Oral Cavity Assessment: Within Functional Limits Oral Care Completed by SLP: Recent completion by staff Oral Cavity - Dentition: Edentulous Vision: Functional for self-feeding Self-Feeding Abilities: Able to feed self Patient Positioning: Upright in chair Baseline Vocal Quality: Low vocal intensity Volitional Cough: Strong Volitional Swallow: Able to elicit    Oral/Motor/Sensory Function Overall Oral Motor/Sensory Function: Within functional limits   Ice Chips Ice chips: Not tested   Thin Liquid Thin Liquid: Impaired Presentation: Cup;Self Fed;Straw Pharyngeal  Phase Impairments: Suspected delayed Swallow;Decreased hyoid-laryngeal movement;Multiple swallows;Wet Vocal Quality;Cough - Immediate Other Comments: consistently    Nectar Thick Nectar Thick Liquid: Impaired Presentation: Cup;Self Fed Pharyngeal Phase Impairments: Cough - Delayed Other Comments: intermittently    Honey Thick Honey Thick Liquid: Not tested   Puree Puree: Impaired Presentation: Self Fed;Spoon Pharyngeal Phase Impairments: Multiple swallows;Cough - Delayed Other Comments: intermittently    Solid   GO   Solid: Not tested Other Comments: pt reported being unable to masticate this consistency PTA       Kenneth Merritt, M.A., CCC-SLP [phone]  Kenneth Merritt 04/22/2016,2:17 PM

## 2016-04-22 NOTE — Care Management Important Message (Signed)
Important Message  Patient Details  Name: Kenneth Merritt MRN: [id number] Date of Birth: [birth year]   Medicare Important Message Given:  Yes    Loann Quill 04/22/2016, 8:36 AM

## 2016-04-22 NOTE — Progress Notes (Signed)
Patient ID: Kenneth Merritt, male   DOB: 02-03-1951, 65 y.o.   MRN: [id number]    PROGRESS NOTE    Kenneth Merritt  [id number] DOB: 09-03-1951 DOA: 04/21/2016  PCP: Neale Burly, MD   Brief Narrative:  Pt is 65 y.o. male with known chronic atrial fibrillation on Eliquis, hyperlipidemia, peripheral vascular disease status post aorta femoral bypass, recent hospitalization from 04/06/16 - 04/15/2016 for sepsis secondary to diverticulitis, presented to [facility] ED with main concern of progressive weakness, fatigue, exertional dyspnea as well as some dyspnea at rest, mid area chest discomfort that occurs with coughing spells, poor oral intake.  In ED, pt noted to be in a-fib with RVR and HR up to 120's, SBP as low as 80's, blood work notable for WBC 12 K, Hg 9.9, K 3.3, troponins 0.07 --> 0.06 --> 0.06, pt admitted to [facility] unit by Kenneth Merritt. Pt started on Cardizem drip.   Assessment & Plan:   Principal Problem:   Atrial fibrillation with RVR, CHADS2 score 4 (HCC) - rate still in 110 - 120's  - currently on cardizem drip as well as on metoprolol 50 mg PO BID - continue same regimen for now, pt on Apixaban at baseline - cardiology consulted - keep in SDU  Active Problems:   Elevated troponins - flat pattern  - cardiology consulted     COPD (chronic obstructive pulmonary disease) ([place]) - some wheezing on expiration this am - continue BD's scheduled and as needed - CT chest w/o contrast requested for further evaluation     Peripheral vascular disease, unspecified ([place])   CAD (coronary artery disease), native coronary artery    Hypokalemia and hypomagnesemia - supplement and repeat BMP/Mg in AM    Hyponatremia - mild, monitor     Chronic diastolic CHF, grade I - per last 2 D ECHO 03/2016 - continue Lasix, per cardiology - weight 62 kg this AM - monitor daily weights, strict I/O    Hyperglycemia - A1C requested  - keep on SSI for now   DVT prophylaxis: Apixaban Code Status: Full Family  Communication: Patient at bedside  Disposition Plan: keep in SDU  Consultants:   Cardiology   Procedures:   None  Antimicrobials:   None  Subjective: Reports feeling weak and tired.   Objective: Filed Vitals:   04/22/16 0845 04/22/16 1118 04/22/16 1437 04/22/16 1517  BP: 106/71 96/72 93/61  94/71  Pulse: 51 55 111 114  Temp: 98 F (36.7 C) 97.9 F (36.6 C)  97.3 F (36.3 C)  TempSrc: Oral Oral  Oral  Resp: 14 26 20 15   Weight:      SpO2: 100% 100% 100% 100%    Intake/Output Summary (Last 24 hours) at 04/22/16 1532 Last data filed at 04/22/16 1315  Gross per 24 hour  Intake 718.92 ml  Output    700 ml  Net  18.92 ml   Filed Weights   04/22/16 0356  Weight: 62.5 kg (137 lb 12.6 oz)    Examination:  General exam: Appears tired and chronically ill.  Respiratory system: Rales with some exp wheezing.  Cardiovascular system: IRRR. No rubs, gallops or clicks.  Gastrointestinal system: Abdomen is nondistended, soft and nontender.  Central nervous system: Alert and oriented. No focal neurological deficits. Extremities: Symmetric 5 x 5 power. Skin: No rashes, lesions or ulcers  Data Reviewed: I have personally reviewed following labs and imaging studies  CBC:  Recent Labs Lab 04/21/16 1807 04/22/16 0212  WBC 12.1* 8.8  NEUTROABS 9.7*  --   HGB 9.9* 9.1*  HCT 30.8* 29.9*  MCV 87.0 88.2  PLT 207 [id number]   Basic Metabolic Panel:  Recent Labs Lab 04/21/16 1807 04/22/16 0212  NA 132* 134*  K 3.3* 3.1*  CL 92* 94*  CO2 29 33*  GLUCOSE 137* 142*  BUN 12 6  CREATININE 0.98 0.76  CALCIUM 7.7* 7.8*  MG 1.5*  --    Cardiac Enzymes:  Recent Labs Lab 04/21/16 1807 04/21/16 2223 04/22/16 0212  TROPONINI 0.07* 0.06* 0.05*   CBG:  Recent Labs Lab 04/22/16 0907 04/22/16 1118  GLUCAP 129* 135*   Urine analysis:    Component Value Date/Time   COLORURINE AMBER* 04/06/2016 1942   APPEARANCEUR CLOUDY* 04/06/2016 1942   LABSPEC 1.021 04/06/2016  1942   PHURINE 5.0 04/06/2016 1942   GLUCOSEU 250* 04/06/2016 1942   HGBUR MODERATE* 04/06/2016 1942   BILIRUBINUR MODERATE* 04/06/2016 1942   KETONESUR 40* 04/06/2016 1942   PROTEINUR NEGATIVE 04/06/2016 1942   UROBILINOGEN 0.2 01/21/2010 1944   NITRITE NEGATIVE 04/06/2016 1942   LEUKOCYTESUR NEGATIVE 04/06/2016 1942   Recent Results (from the past 240 hour(s))  MRSA PCR Screening     Status: None   Collection Time: 04/21/16 11:00 PM  Result Value Ref Range Status   MRSA by PCR NEGATIVE NEGATIVE Final   \   Radiology Studies: Dg Chest Portable 1 View 04/21/2016  No change from the previous exam.  Scheduled Meds: . apixaban  5 mg Oral BID  . digoxin  0.125 mg Oral Daily  . furosemide  20 mg Oral Daily  . gabapentin  300 mg Oral TID  . insulin aspart  0-15 Units Subcutaneous TID WC  . ipratropium-albuterol  3 mL Nebulization Q6H  . ipratropium-albuterol      . metoprolol  50 mg Oral BID  . pantoprazole  40 mg Oral Daily  . simvastatin  40 mg Oral q1800   Continuous Infusions: . diltiazem (CARDIZEM) infusion Stopped (04/21/16 2254)     LOS: 1 day    Time spent: 20 minutes    Faye Ramsay, MD [facility] Pager [phone]  If 7PM-7AM, please contact night-coverage [email or web address] Password [facility] 04/22/2016, 3:32 PM

## 2016-04-22 NOTE — Significant Event (Signed)
Dr paged and notified via page concerns for patient heartrate remaining 120-145.  Patient states that he "feels like his heart is racing"  BP 99/76 mmHg  Pulse 63  Temp(Src) 97.3 F (36.3 C) (Oral)  Resp 17  Wt 62.5 kg (137 lb 12.6 oz)  SpO2 100% Awaiting physician response at this time, family at bedside

## 2016-04-22 NOTE — Consult Note (Signed)
   [facility] CM Inpatient Consult   04/22/2016  AMIN DORROUGH 03/12/1951 [id number]  Patient is currently active with [facility] for chronic disease management services.  Patient has been engaged by a [employer].  Patient had refused a skilled nursing facility stay at last admission and was D/C on 06/15/16 with HX of COPD exacerbation.  Chart review reveals a 65 y.o. male with past medical history significant for A. fib and coronary artery disease presented to the emergency room with chief complaint of shortness of breath of note the patient had been recently discharged from the hospital with a diagnosis of colitis. Patient states that after that leaving the hospital he never quite got  his strength back. He has continued to have diarrhea over the last few days mostly watery.   Our community based plan of care has focused on disease management and community resource support.  Patient will receive a post discharge transition of care call and will be evaluated for monthly home visits for assessments and disease process education.  Spoke with Inpatient Case Manager aware that [facility] following as a new patient.  Patient was also active with [facility].   Of note, [facility] Care Management services does not replace or interfere with any services that are needed or arranged by inpatient case management or social work.  For additional questions or referrals please contact:  Natividad Brood, RN BSN [facility] Liaison  [phone] business mobile phone Toll free office [phone]

## 2016-04-22 NOTE — Progress Notes (Signed)
Initial Nutrition Assessment  DOCUMENTATION CODES:   Severe malnutrition in context of acute illness/injury  INTERVENTION:  Provide Boost Breeze po BID, each supplement provides 250 kcal and 9 grams of protein Provide Ensure Enlive once daily, each supplement provides 350 kcal and 20 grams of protein   NUTRITION DIAGNOSIS:   Inadequate oral intake related to poor appetite, altered GI function as evidenced by percent weight loss.   GOAL:   Patient will meet greater than or equal to 90% of their needs   MONITOR:   PO intake, Supplement acceptance, Labs, Weight trends, Skin, I & O's  REASON FOR ASSESSMENT:   Malnutrition Screening Tool    ASSESSMENT:   65 y.o. male with past medical history significant for A. fib and coronary artery disease presented to the emergency room with chief complaint of shortness of breath of note the patient had been recently discharged from the hospital with a diagnosis of colitis. Patient states that after that leaving the hospital he never quite got her strength back. He has continued to have diarrhea over the last few days mostly watery.   Pt reports having a poor appetite for the past few weeks and eating <50% compared to usual. Weight history shows that pt has lost 8 lbs in less than one month- 5.5% weight loss is severe for time frame. Per nursing notes, pt ate 25% of breakfast. For lunch, pt drank the liquids, but did not eat any solid food. He does not think he would like Ensure, but is willing to try; states he would prefer Boost Breeze. Pt has moderate muscle wasting per nutrition-focused physical exam.  Diet was downgraded from Heart Healthy to Dysphagia 1 earlier today after SLP evaluation.   Labs: low potassium, low chloride, low calcium, low hemoglobin, low magnesium  Diet Order:  DIET - DYS 1 Room service appropriate?: Yes; Fluid consistency:: Nectar Thick  Skin:  Reviewed, no issues  Last BM:  6/13  Height:   Ht Readings from  Last 1 Encounters:  04/06/16 5\' 4"  (1.626 m)    Weight:   Wt Readings from Last 1 Encounters:  04/22/16 137 lb 12.6 oz (62.5 kg)    Ideal Body Weight:  59.1 kg  BMI:  Body mass index is 23.64 kg/(m^2).  Estimated Nutritional Needs:   Kcal:  1600-1800  Protein:  85-95 grams  Fluid:  1.6-1.8 L/day  EDUCATION NEEDS:   No education needs identified at this time  [place], LDN Inpatient Clinical Dietitian Pager: [phone] After Hours Pager: [phone]

## 2016-04-22 NOTE — Progress Notes (Signed)
[facility]  Patient Status: Active (receiving services up to time of hospitalization)  [facility] is providing the following services: RN, PT, OT and MSW  If patient discharges after hours, please call [phone].   Kenneth Merritt 04/22/2016, 10:07 AM

## 2016-04-22 NOTE — Progress Notes (Signed)
RT instructed patient on use of flutter valve. Patient did attempt to use it but had a very weak effort. RN notified.

## 2016-04-23 ENCOUNTER — Inpatient Hospital Stay (HOSPITAL_COMMUNITY): Payer: Medicare Other

## 2016-04-23 DIAGNOSIS — J9600 Acute respiratory failure, unspecified whether with hypoxia or hypercapnia: Secondary | ICD-10-CM

## 2016-04-23 DIAGNOSIS — E43 Unspecified severe protein-calorie malnutrition: Secondary | ICD-10-CM

## 2016-04-23 DIAGNOSIS — I480 Paroxysmal atrial fibrillation: Secondary | ICD-10-CM

## 2016-04-23 DIAGNOSIS — I4891 Unspecified atrial fibrillation: Secondary | ICD-10-CM

## 2016-04-23 DIAGNOSIS — R4182 Altered mental status, unspecified: Secondary | ICD-10-CM | POA: Diagnosis not present

## 2016-04-23 DIAGNOSIS — Z7901 Long term (current) use of anticoagulants: Secondary | ICD-10-CM

## 2016-04-23 DIAGNOSIS — R0600 Dyspnea, unspecified: Secondary | ICD-10-CM

## 2016-04-23 DIAGNOSIS — I739 Peripheral vascular disease, unspecified: Secondary | ICD-10-CM

## 2016-04-23 DIAGNOSIS — I2510 Atherosclerotic heart disease of native coronary artery without angina pectoris: Secondary | ICD-10-CM

## 2016-04-23 DIAGNOSIS — J438 Other emphysema: Secondary | ICD-10-CM

## 2016-04-23 DIAGNOSIS — Z95828 Presence of other vascular implants and grafts: Secondary | ICD-10-CM

## 2016-04-23 LAB — POCT I-STAT 3, ART BLOOD GAS (G3+)
Acid-Base Excess: 9 mmol/L — ABNORMAL HIGH (ref 0.0–2.0)
Bicarbonate: 34.6 mEq/L — ABNORMAL HIGH (ref 20.0–24.0)
O2 SAT: 96 %
PCO2 ART: 50.2 mmHg — AB (ref 35.0–45.0)
PO2 ART: 83 mmHg (ref 80.0–100.0)
TCO2: 36 mmol/L (ref 0–100)
pH, Arterial: 7.446 (ref 7.350–7.450)

## 2016-04-23 LAB — GLUCOSE, CAPILLARY
GLUCOSE-CAPILLARY: 189 mg/dL — AB (ref 65–99)
Glucose-Capillary: 141 mg/dL — ABNORMAL HIGH (ref 65–99)
Glucose-Capillary: 175 mg/dL — ABNORMAL HIGH (ref 65–99)
Glucose-Capillary: 147 mg/dL — ABNORMAL HIGH (ref 65–99)

## 2016-04-23 LAB — BASIC METABOLIC PANEL WITH GFR
Anion gap: 6 (ref 5–15)
BUN: <5 mg/dL — ABNORMAL LOW (ref 6–20)
CO2: 35 mmol/L — ABNORMAL HIGH (ref 22–32)
Calcium: 7.6 mg/dL — ABNORMAL LOW (ref 8.9–10.3)
Chloride: 95 mmol/L — ABNORMAL LOW (ref 101–111)
Creatinine, Ser: 0.47 mg/dL — ABNORMAL LOW (ref 0.61–1.24)
GFR calc Af Amer: >60 mL/min
GFR calc non Af Amer: >60 mL/min
Glucose, Bld: 114 mg/dL — ABNORMAL HIGH (ref 65–99)
Potassium: 3.2 mmol/L — ABNORMAL LOW (ref 3.5–5.1)
Sodium: 136 mmol/L (ref 135–145)

## 2016-04-23 LAB — CBC
HCT: 28.1 % — ABNORMAL LOW (ref 39.0–52.0)
Hemoglobin: 8.6 g/dL — ABNORMAL LOW (ref 13.0–17.0)
MCH: 27 pg (ref 26.0–34.0)
MCHC: 30.6 g/dL (ref 30.0–36.0)
MCV: 88.4 fL (ref 78.0–100.0)
Platelets: 199 10*3/uL (ref 150–400)
RBC: 3.18 MIL/uL — ABNORMAL LOW (ref 4.22–5.81)
RDW: 15.1 % (ref 11.5–15.5)
WBC: 6.7 10*3/uL (ref 4.0–10.5)

## 2016-04-23 LAB — MAGNESIUM: Magnesium: 1.6 mg/dL — ABNORMAL LOW (ref 1.7–2.4)

## 2016-04-23 LAB — HEMOGLOBIN A1C
Hgb A1c MFr Bld: 6.6 % — ABNORMAL HIGH (ref 4.8–5.6)
Mean Plasma Glucose: 143 mg/dL

## 2016-04-23 LAB — BRAIN NATRIURETIC PEPTIDE: B Natriuretic Peptide: 117.8 pg/mL — ABNORMAL HIGH (ref 0.0–100.0)

## 2016-04-23 MED ORDER — AMPICILLIN-SULBACTAM SODIUM 3 (2-1) G IJ SOLR
3.0000 g | Freq: Three times a day (TID) | INTRAMUSCULAR | Status: DC
  Administered 2016-04-23 – 2016-04-25 (×6): 3 g via INTRAVENOUS
  Filled 2016-04-23 (×8): qty 3

## 2016-04-23 MED ORDER — FUROSEMIDE 10 MG/ML IJ SOLN: 40.0000 mg | Freq: Two times a day (BID) | INTRAMUSCULAR | Status: DC

## 2016-04-23 MED ORDER — POTASSIUM CHLORIDE CRYS ER 20 MEQ PO TBCR
40.0000 meq | EXTENDED_RELEASE_TABLET | Freq: Once | ORAL | Status: AC
  Administered 2016-04-23: 40 meq via ORAL
  Filled 2016-04-23: qty 2

## 2016-04-23 MED ORDER — FUROSEMIDE 10 MG/ML IJ SOLN: 40.0000 mg | Freq: Every day | INTRAMUSCULAR | Status: DC

## 2016-04-23 MED ORDER — MAGNESIUM SULFATE 2 GM/50ML IV SOLN
2.0000 g | Freq: Once | INTRAVENOUS | Status: AC
  Administered 2016-04-23: 2 g via INTRAVENOUS
  Filled 2016-04-23: qty 50

## 2016-04-23 MED ORDER — LEVALBUTEROL HCL 1.25 MG/0.5ML IN NEBU
1.2500 mg | INHALATION_SOLUTION | RESPIRATORY_TRACT | Status: DC
  Administered 2016-04-23 – 2016-04-24 (×8): 1.25 mg via RESPIRATORY_TRACT
  Filled 2016-04-23 (×7): qty 0.5

## 2016-04-23 MED ORDER — LEVALBUTEROL HCL 1.25 MG/0.5ML IN NEBU
1.2500 mg | INHALATION_SOLUTION | Freq: Four times a day (QID) | RESPIRATORY_TRACT | Status: DC | PRN
  Administered 2016-04-23 – 2016-04-26 (×7): 1.25 mg via RESPIRATORY_TRACT
  Filled 2016-04-23 (×7): qty 0.5

## 2016-04-23 MED ORDER — ALPRAZOLAM 0.25 MG PO TABS
0.2500 mg | ORAL_TABLET | Freq: Once | ORAL | Status: AC
  Administered 2016-04-23: 0.25 mg via ORAL
  Filled 2016-04-23: qty 1

## 2016-04-23 MED ORDER — METHYLPREDNISOLONE SODIUM SUCC 125 MG IJ SOLR
60.0000 mg | Freq: Three times a day (TID) | INTRAMUSCULAR | Status: DC
  Administered 2016-04-23 – 2016-04-24 (×4): 60 mg via INTRAVENOUS
  Filled 2016-04-23 (×5): qty 2

## 2016-04-23 MED ORDER — METOPROLOL TARTRATE 5 MG/5ML IV SOLN
2.5000 mg | Freq: Once | INTRAVENOUS | Status: AC
  Administered 2016-04-23: 2.5 mg via INTRAVENOUS
  Filled 2016-04-23: qty 5

## 2016-04-23 MED ORDER — IPRATROPIUM BROMIDE 0.02 % IN SOLN
0.5000 mg | Freq: Four times a day (QID) | RESPIRATORY_TRACT | Status: DC
  Administered 2016-04-23: 0.5 mg via RESPIRATORY_TRACT
  Filled 2016-04-23: qty 2.5

## 2016-04-23 MED ORDER — IPRATROPIUM-ALBUTEROL 0.5-2.5 (3) MG/3ML IN SOLN: 3.0000 mL | RESPIRATORY_TRACT | Status: DC

## 2016-04-23 MED ORDER — OXYCODONE-ACETAMINOPHEN 5-325 MG PO TABS
2.0000 | ORAL_TABLET | Freq: Once | ORAL | Status: AC
  Administered 2016-04-23: 2 via ORAL
  Filled 2016-04-23: qty 2

## 2016-04-23 MED ORDER — IPRATROPIUM BROMIDE 0.02 % IN SOLN
0.5000 mg | RESPIRATORY_TRACT | Status: DC
  Administered 2016-04-23 – 2016-04-24 (×7): 0.5 mg via RESPIRATORY_TRACT
  Filled 2016-04-23 (×7): qty 2.5

## 2016-04-23 MED ORDER — DIGOXIN 0.25 MG/ML IJ SOLN
0.1250 mg | Freq: Once | INTRAMUSCULAR | Status: AC
  Administered 2016-04-23: 0.125 mg via INTRAVENOUS
  Filled 2016-04-23: qty 2

## 2016-04-23 NOTE — Progress Notes (Addendum)
Patient ID: Kenneth Merritt, male   DOB: [birth year], 65 y.o.   MRN: [id number]    PROGRESS NOTE    Kenneth Merritt  [id number] DOB: Jan 14, 1951 DOA: 04/21/2016  PCP: Neale Burly, MD   Brief Narrative:  Pt is 65 y.o. male with known chronic atrial fibrillation on Eliquis, hyperlipidemia, peripheral vascular disease status post aorta femoral bypass, recent hospitalization from 04/06/16 - 04/15/2016 for sepsis secondary to diverticulitis, presented to [facility] ED with main concern of progressive weakness, fatigue, exertional dyspnea as well as some dyspnea at rest, mid area chest discomfort that occurs with coughing spells, poor oral intake.  In ED, pt noted to be in a-fib with RVR and HR up to 120's, SBP as low as 80's, blood work notable for WBC 12 K, Hg 9.9, K 3.3, troponins 0.07 --> 0.06 --> 0.06, pt admitted to [facility] unit by [facility]. Pt started on Cardizem drip.   Major events since admission: 6/13 - pt is a-fib, on cardizem drip, cardiology consulted  6/14 - pt more lethargic, still in A-fib, was supposed to be on cardizem drip but was ? Discontinued yesterday, ? Converted to NSR, PCCM consulted   Assessment & Plan:   Principal Problem:   Atrial fibrillation with RVR, CHADS2 score 4 (HCC) - rate in 170;s this AM  - on metoprolol.lol 50 mg po bid and unclear why not on cardizem drip this AM, order was placed - d/w RN, place pt on cardizem drip, cardiology to see this AM  - continue same regimen for now, pt on Apixaban at baseline - keep in SDU  Active Problems:   Acute metabolic encephalopathy - more lethargic this AM, rales bilaterally - treat a-fib as noted above - follow up on cardiology team recommendations  - PCCM consulted     Elevated troponins - flat pattern  - cardiology consulted     Acute hypoxic respiratory failure, COPD (chronic obstructive pulmonary disease), ? Aspiration - initial CXR with no acute findings - CT chest with RUL nodule, ? Neoplastic nodule with additional new  ill-defined nodular consolidation right lower lobe - PCCM to see this AM    Peripheral vascular disease, unspecified ([place])   CAD (coronary artery disease), native coronary artery    Hypokalemia and hypomagnesemia - supplement and repeat BMP/Mg in AM    Hyponatremia - supplement and repeat BMP in AM    Chronic diastolic CHF, grade I - per last 2 D ECHO 03/2016 - continue Lasix, per cardiology - weight 62 kg this AM - monitor daily weights, strict I/O    Hyperglycemia - A1C requested  - keep on SSI for now     Protein calorie malnutrition - nutritionist consulted   DVT prophylaxis: Apixaban Code Status: Full Family Communication: Patient at bedside, family called and left message to call me back Disposition Plan: keep in SDU  Consultants:   Cardiology   PCCM  Procedures:   None  Antimicrobials:   None  Subjective: More lethargic this AM  Objective: Filed Vitals:   04/23/16 0256 04/23/16 0300 04/23/16 0400 04/23/16 0500  BP:  95/52 117/92   Pulse: 119 110 100   Temp:      TempSrc:      Resp: 26 27 26    Weight:    62.7 kg (138 lb 3.7 oz)  SpO2: 99% 100% 100%     Intake/Output Summary (Last 24 hours) at 04/23/16 0640 Last data filed at 04/23/16 0530  Gross per 24 hour  Intake  700 ml  Output   2175 ml  Net  -1475 ml   Filed Weights   04/22/16 0356 04/23/16 0500  Weight: 62.5 kg (137 lb 12.6 oz) 62.7 kg (138 lb 3.7 oz)    Examination:  General exam: Appears lethargic, able to answer some questions.  Respiratory system: Rales with some exp wheezing.  Cardiovascular system: IRRR. No rubs, gallops or clicks.  Gastrointestinal system: Abdomen is nondistended, soft and nontender.  Central nervous system: lethargic but able to follow commands, still with good hand grip and able to move extremities  Extremities: Symmetric 5 x 5 power. Skin: No rashes, lesions or ulcers  Data Reviewed: I have personally reviewed following labs and imaging  studies  CBC:  Recent Labs Lab 04/21/16 1807 04/22/16 0212 04/23/16 0233  WBC 12.1* 8.8 6.7  NEUTROABS 9.7*  --   --   HGB 9.9* 9.1* 8.6*  HCT 30.8* 29.9* 28.1*  MCV 87.0 88.2 88.4  PLT 207 199 [id number]   Basic Metabolic Panel:  Recent Labs Lab 04/21/16 1807 04/22/16 0212 04/23/16 0233  NA 132* 134* 136  K 3.3* 3.1* 3.2*  CL 92* 94* 95*  CO2 29 33* 35*  GLUCOSE 137* 142* 114*  BUN 12 6 <5*  CREATININE 0.98 0.76 0.47*  CALCIUM 7.7* 7.8* 7.6*  MG 1.5*  --   --    Cardiac Enzymes:  Recent Labs Lab 04/21/16 1807 04/21/16 2223 04/22/16 0212  TROPONINI 0.07* 0.06* 0.05*   CBG:  Recent Labs Lab 04/22/16 0907 04/22/16 1118 04/22/16 1639 04/22/16 2127  GLUCAP 129* 135* 155* 145*   Urine analysis:    Component Value Date/Time   COLORURINE AMBER* 04/06/2016 1942   APPEARANCEUR CLOUDY* 04/06/2016 1942   LABSPEC 1.021 04/06/2016 1942   PHURINE 5.0 04/06/2016 1942   GLUCOSEU 250* 04/06/2016 1942   HGBUR MODERATE* 04/06/2016 1942   BILIRUBINUR MODERATE* 04/06/2016 1942   KETONESUR 40* 04/06/2016 1942   PROTEINUR NEGATIVE 04/06/2016 1942   UROBILINOGEN 0.2 01/21/2010 1944   NITRITE NEGATIVE 04/06/2016 1942   LEUKOCYTESUR NEGATIVE 04/06/2016 1942   Recent Results (from the past 240 hour(s))  MRSA PCR Screening     Status: None   Collection Time: 04/21/16 11:00 PM  Result Value Ref Range Status   MRSA by PCR NEGATIVE NEGATIVE Final    Radiology Studies: Dg Chest Portable 1 View 04/21/2016  No change from the previous exam.  Ct Chest Wo Contrast 04/22/2016 New irregular nodular density within the superior segment of the right upper lobe, measuring 1.7 x 0.8 x 0.8 cm, suspicious for neoplastic nodule. Consider PET-CT for further characterization. Recommend surgical consultation for further workup considerations. 2. Additional new ill-defined nodular consolidation more inferiorly within the right lower lobe, measuring approximately 1.8 x 0.7 x 2.1 cm. This may also  represent neoplastic process but is less suspicious, perhaps atelectasis or confluent scarring. PET-CT may be helpful for this finding as well. 3. Emphysema, upper lobe predominant. 4. Coronary artery calcifications, particularly dense within the left anterior descending coronary artery. Recommend correlation with any possible associated cardiac symptoms. 5. Cholelithiasis without evidence of acute cholecystitis. 6. Chronic-appearing compression fracture deformities of the T6 through T8 vertebral bodies. T6 compression has slightly progressed compared to the previous exam of 03/27/2016. T7 and T8 compression deformities appear stable. These results will be called to the ordering clinician or representative by the Radiologist Assistant, and communication documented in the PACS or zVision Dashboard.  Dg Chest Port 1 View 04/23/2016  Nodular opacity in  the periphery the right mid lung, better seen on CT 1 day prior. Areas of scarring, primarily in the upper lobes near the apices. Lungs hyperexpanded. No edema or consolidation. Stable cardiac silhouette.    Scheduled Meds: . apixaban  5 mg Oral BID  . digoxin  0.125 mg Oral Daily  . feeding supplement  1 Container Oral BID PC  . feeding supplement (ENSURE ENLIVE)  237 mL Oral Q24H  . furosemide  20 mg Oral Daily  . gabapentin  300 mg Oral TID  . insulin aspart  0-15 Units Subcutaneous TID WC  . levalbuterol  1.25 mg Nebulization Q4H  . metoprolol  50 mg Oral BID  . multivitamin with minerals  1 tablet Oral Daily  . pantoprazole  40 mg Oral Daily  . simvastatin  40 mg Oral q1800   Continuous Infusions: . diltiazem (CARDIZEM) infusion Stopped (04/21/16 2254)     LOS: 2 days    Time spent: 20 minutes    Faye Ramsay, MD [facility] Pager [phone]  If 7PM-7AM, please contact night-coverage [email or web address] Password [facility] 04/23/2016, 6:40 AM

## 2016-04-23 NOTE — Clinical Social Work Note (Signed)
CSW ackowledge receipt of family requesting SNF placement for short term rehab.  CSW will follow up with patient and family once PT has evaluated patient.  CSW to continue to follow patient's progress throughout discharge planning.  Jones Broom. [place], MSW, [place] 04/23/2016 4:37 PM

## 2016-04-23 NOTE — Progress Notes (Signed)
SLP Cancellation Note  Patient Details Name: Kenneth Merritt MRN: [id number] DOB: Jul 06, 1951   Cancelled treatment:       Reason Eval/Treat Not Completed: Medical issues which prohibited therapy. Noted critical care consult re: pt decline/shift to NPO and hold on MBSS. Will follow.    [place], [place] Pager [phone] 04/23/2016, 9:37 AM

## 2016-04-23 NOTE — Consult Note (Signed)
Reason for Consult:   AF with RVR  Requesting Physician: [facility] Primary Cardiologist Was Dr Ron Parker  HPI:   65 y/o male from [place] with a history of CAD-70% LAD in 2010,  Nl LVF by echo May 2017, severe COPD, and PAF. He was recently admitted with Sepsis and colitis. Discharged 04/15/16. Since discharge the pt has been weak. Continued loose stools. He presented to the ED 04/21/16 and was noted to be in AF with RVR (EKG on 04/04/16 looks like sinus tach). Since admission he has converted with IV Diltiazem to NSR. He was very lethargic when I went to examin him and could give no history.  PMHx:  Past Medical History  Diagnosis Date  . GERD (gastroesophageal reflux disease)   . COPD (chronic obstructive pulmonary disease) ([place])     severe by PFTs August 2010  . 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 Atrial fibrillation (H[facility])     post-op a-fib. 3/11. post Aortobifem , /  Atrial fibrillation from nebulizer treatment  May, 2012, while hospitalized  . Renal artery stenosis ([place])      presumably corrected at time of aortobifem surgery March, 2011  . Tobacco user      stop smoking  . Pneumothorax      2011  before aortobifemoral surgery,  resolved  . Peripheral vascular disease, unspecified ([place])     bilateral intermittent claudication  . CAD (coronary artery disease), native coronary artery     catheterization 06/2009.Marland KitchenMarland Kitchen70% proximal LAD and 30% proximal RCA. normal LV function, no intervention planned prior to surgery for aorta. LV normal function  by catheter 8/10.   [name] Dyslipidemia   . DJD (degenerative joint disease)   . Ejection fraction     60%, echo, May, 2012, rapid atrial fib at the time  . Cancer [facility]) 2013    Prostate Radiation  . Myocardial infarction ([place]) 2011  . On home O2     2L N/C  . Chronic thoracic back pain   . Chronic pain syndrome     Past Surgical History   Procedure Laterality Date  . Abdominal aortic aneurysm repair  01/11/2010  . Pr vein bypass graft,aorto-fem-pop    . Inguinal hernia repair      RIGHT    SOCHx:  reports that he quit smoking about 6 years ago. His smoking use included Cigarettes. He has a 80 pack-year smoking history. He has never used smokeless tobacco. He reports that he does not drink alcohol or use illicit drugs.  FAMHx: Family History  Problem Relation Age of Onset  . Cancer Father     prostate  . Heart attack Father     ALLERGIES: Allergies  Allergen Reactions  . Levaquin [Levofloxacin In D5w] Other (See Comments)    Pt states it caused dry mouth and rectal bleeding.     ROS: Review of Systems: General: negative for chills, fever, night sweats or weight changes.  Cardiovascular: negative for chest pain, dyspnea on exertion, edema, orthopnea, palpitations, paroxysmal nocturnal dyspnea or shortness of breath HEENT: negative for any visual disturbances, blindness, glaucoma Dermatological: negative for rash Respiratory: negative for cough, hemoptysis, or wheezing Urologic: negative for hematuria or dysuria Abdominal: negative for nausea, vomiting, diarrhea, bright red blood per rectum, melena, or hematemesis Neurologic: negative for visual changes, syncope, or dizziness Musculoskeletal: negative  for back pain, joint pain, or swelling Psych: cooperative and appropriate All other systems reviewed and are otherwise negative except as noted above.   HOME MEDICATIONS: Prior to Admission medications   Medication Sig Start Date End Date Taking? Authorizing Provider  acetaminophen (TYLENOL) 500 MG tablet Take 1,000 mg by mouth every 6 (six) hours as needed for mild pain.   Yes Historical Provider, MD  albuterol (PROVENTIL HFA;VENTOLIN HFA) 108 (90 BASE) MCG/ACT inhaler Inhale 2 puffs into the lungs every 4 (four) hours as needed for wheezing or shortness of breath. 09/19/15  Yes Donne [name], MD  albuterol  (PROVENTIL) (2.5 MG/3ML) 0.083% nebulizer solution Take 3 mLs (2.5 mg total) by nebulization every 4 (four) hours as needed for wheezing or shortness of breath. 03/15/15  Yes [name], MD  ALPRAZolam [name]) 0.25 MG tablet Take 0.25 mg by mouth 3 (three) times daily as needed for anxiety or sleep.  12/17/15  Yes Historical Provider, MD  ciprofloxacin (CIPRO) 250 MG tablet Take 1 tablet (250 mg total) by mouth 2 (two) times daily. For 5days 04/15/16  Yes Domenic Polite, MD  digoxin (LANOXIN) 0.125 MG tablet Take 1 tablet (0.125 mg total) by mouth daily. 01/10/16  Yes [name], MD  ELIQUIS 5 MG TABS tablet TAKE 1 TABLET BY MOUTH TWICE A DAY 02/11/16  Yes Satira Sark, MD  furosemide (LASIX) 20 MG tablet Take 1 tablet (20 mg total) by mouth daily. For 3days then as needed for swelling/weight gain of >2lbs overnight or 4-5lbs in 1 week 04/15/16  Yes Domenic Polite, MD  gabapentin (NEURONTIN) 300 MG capsule Take 300 mg by mouth 3 (three) times daily.   Yes Historical Provider, MD  HYDROcodone-acetaminophen (NORCO/VICODIN) 5-325 MG tablet Take 1-2 tablets by mouth every 6 (six) hours as needed for moderate pain or severe pain. 04/15/16  Yes Domenic Polite, MD  ipratropium (ATROVENT) 0.02 % nebulizer solution Take 0.5 mg by nebulization every 4 (four) hours as needed for wheezing or shortness of breath.   Yes Historical Provider, MD  metoprolol (LOPRESSOR) 50 MG tablet Take 50 mg by mouth 2 (two) times daily. 12/27/13  Yes Historical Provider, MD  metroNIDAZOLE (FLAGYL) 250 MG tablet Take 1 tablet (250 mg total) by mouth every 8 (eight) hours. For 5days 04/15/16  Yes Domenic Polite, MD  nitroGLYCERIN (NITROSTAT) 0.4 MG SL tablet Place 1 tablet (0.4 mg total) under the tongue every 5 (five) minutes x 3 doses as needed for chest pain. 06/11/15  Yes [name], MD  omeprazole (PRILOSEC) 20 MG capsule Take 20 mg by mouth every morning.  04/21/11  Yes Historical Provider, MD  oxyCODONE (OXYCONTIN) 15 mg 12 hr  tablet Take 1 tablet (15 mg total) by mouth every 12 (twelve) hours. 04/02/16  Yes Tanna Furry, MD  predniSONE (DELTASONE) 20 MG tablet Take 1-2 tablets (20-40 mg total) by mouth daily. Take 40mg  for 3days then 20mg  for 3days then STOP 04/15/16  Yes Domenic Polite, MD  simvastatin (ZOCOR) 40 MG tablet TAKE 1 TABLET (40 MG TOTAL) BY MOUTH EVERY EVENING. 08/29/15  Yes [name], MD  levalbuterol [facility] HFA) 45 MCG/ACT inhaler Inhale 2 puffs into the lungs every 4 (four) hours as needed for wheezing. Patient not taking: Reported on 04/17/2016 03/20/16   [name], DO  metFORMIN (GLUCOPHAGE) 1000 MG tablet Take 1,000 mg by mouth daily with breakfast.    Historical Provider, MD    HOSPITAL MEDICATIONS: I have reviewed the patient's current medications.  VITALS: Blood pressure 101/58, pulse 109, temperature 98.4 F (36.9 C), temperature source Oral, resp. rate 20, weight 138 lb 3.7 oz (62.7 kg), SpO2 97 %.  PHYSICAL EXAM: General appearance: cooperative and no distress Neck: no carotid bruit and no JVD Lungs: diffuse rhonchi Heart: regular rate and rhythm and decreased heart sounds Abdomen: soft, non-tender; bowel sounds normal; no masses,  no organomegaly and mid line surgical scar Extremities: extremities normal, atraumatic, no cyanosis or edema Skin: pale, cool, dry Neurologic: Grossly normal, lethargic  LABS: Results for orders placed or performed during the hospital encounter of 04/21/16 (from the past 24 hour(s))  Glucose, capillary     Status: Abnormal   Collection Time: 04/22/16  4:39 PM  Result Value Ref Range   Glucose-Capillary 155 (H) 65 - 99 mg/dL   Comment 1 Capillary Specimen   Glucose, capillary     Status: Abnormal   Collection Time: 04/22/16  9:27 PM  Result Value Ref Range   Glucose-Capillary 145 (H) 65 - 99 mg/dL   Comment 1 Capillary Specimen   CBC     Status: Abnormal   Collection Time: 04/23/16  2:33 AM  Result Value Ref Range   WBC 6.7 4.0 - 10.5 K/uL    RBC 3.18 (L) 4.22 - 5.81 MIL/uL   Hemoglobin 8.6 (L) 13.0 - 17.0 g/dL   HCT 28.1 (L) 39.0 - 52.0 %   MCV 88.4 78.0 - 100.0 fL   MCH 27.0 26.0 - 34.0 pg   MCHC 30.6 30.0 - 36.0 g/dL   RDW 15.1 11.5 - 15.5 %   Platelets 199 150 - 400 K/uL  Basic metabolic panel     Status: Abnormal   Collection Time: 04/23/16  2:33 AM  Result Value Ref Range   Sodium 136 135 - 145 mmol/L   Potassium 3.2 (L) 3.5 - 5.1 mmol/L   Chloride 95 (L) 101 - 111 mmol/L   CO2 35 (H) 22 - 32 mmol/L   Glucose, Bld 114 (H) 65 - 99 mg/dL   BUN <5 (L) 6 - 20 mg/dL   Creatinine, Ser 0.47 (L) 0.61 - 1.24 mg/dL   Calcium 7.6 (L) 8.9 - 10.3 mg/dL   GFR calc non Af Amer >60 >60 mL/min   GFR calc Af Amer >60 >60 mL/min   Anion gap 6 5 - 15  Magnesium     Status: Abnormal   Collection Time: 04/23/16  7:51 AM  Result Value Ref Range   Magnesium 1.6 (L) 1.7 - 2.4 mg/dL  Glucose, capillary     Status: Abnormal   Collection Time: 04/23/16  8:44 AM  Result Value Ref Range   Glucose-Capillary 141 (H) 65 - 99 mg/dL   Comment 1 Capillary Specimen   I-STAT 3, arterial blood gas (G3+)     Status: Abnormal   Collection Time: 04/23/16  9:07 AM  Result Value Ref Range   pH, Arterial 7.446 7.350 - 7.450   pCO2 arterial 50.2 (H) 35.0 - 45.0 mmHg   pO2, Arterial 83.0 80.0 - 100.0 mmHg   Bicarbonate 34.6 (H) 20.0 - 24.0 mEq/L   TCO2 36 0 - 100 mmol/L   O2 Saturation 96.0 %   Acid-Base Excess 9.0 (H) 0.0 - 2.0 mmol/L   Patient temperature HIDE    Collection site RADIAL, ALLEN'S TEST ACCEPTABLE    Sample type ARTERIAL   Glucose, capillary     Status: Abnormal   Collection Time: 04/23/16 12:19 PM  Result Value Ref Range  Glucose-Capillary 189 (H) 65 - 99 mg/dL   Comment 1 Capillary Specimen     EKG: AF with RVR  IMAGING: Ct Chest Wo Contrast  04/22/2016  CLINICAL DATA:  Known chronic atrial fibrillation, peripheral vascular disease, status post aortofemoral bypass. Hospitalized 5/28 through 6/6 for sepsis. Dyspnea.  EXAM: CT CHEST WITHOUT CONTRAST TECHNIQUE: Multidetector CT imaging of the chest was performed following the standard protocol without IV contrast. COMPARISON:  Chest CT dated 03/27/2016 FINDINGS: Mediastinum/Lymph Nodes: Scattered atherosclerotic changes of the normal-caliber thoracic aorta. Heart size is normal. No pericardial effusion. Coronary artery calcifications noted. Scattered small lymph nodes within the mediastinum, none of which are pathologic by CT size criteria. Esophagus appears normal. Trachea and central bronchi are unremarkable. Lungs/Pleura: Emphysematous changes bilaterally, upper lobe predominant. New irregular nodule within the superior segment of the right upper lobe, measuring 1.7 x 0.8 cm (series 3, image 63). Additional new nodular consolidation more inferiorly within the right lower lobe, measures approximately 1.8 x 0.7 cm, possibly atelectasis or confluent scarring (series 3, image 104). Confluent scarring/ fibrosis noted at each lung apex. Upper abdomen: Small stone within the gallbladder. No evidence of acute cholecystitis. Limited images of the upper abdomen are otherwise unremarkable. Musculoskeletal: Chronic-appearing compression fracture deformities noted within the mid thoracic spine, slightly progressed at the T6 vertebral body level since recent chest CT of 03/27/2016, not significantly changed at the T7 and T8 vertebral body levels. No acute-appearing osseous abnormality. IMPRESSION: 1. New irregular nodular density within the superior segment of the right upper lobe, measuring 1.7 x 0.8 x 0.8 cm, suspicious for neoplastic nodule. Consider PET-CT for further characterization. Recommend surgical consultation for further workup considerations. 2. Additional new ill-defined nodular consolidation more inferiorly within the right lower lobe, measuring approximately 1.8 x 0.7 x 2.1 cm. This may also represent neoplastic process but is less suspicious, perhaps atelectasis or confluent  scarring. PET-CT may be helpful for this finding as well. 3. Emphysema, upper lobe predominant. 4. Coronary artery calcifications, particularly dense within the left anterior descending coronary artery. Recommend correlation with any possible associated cardiac symptoms. 5. Cholelithiasis without evidence of acute cholecystitis. 6. Chronic-appearing compression fracture deformities of the T6 through T8 vertebral bodies. T6 compression has slightly progressed compared to the previous exam of 03/27/2016. T7 and T8 compression deformities appear stable. These results will be called to the ordering clinician or representative by the Radiologist Assistant, and communication documented in the PACS or zVision Dashboard. Electronically Signed   By: Franki Cabot M.D.   On: 04/22/2016 18:21   Dg Chest Port 1 View  04/23/2016  CLINICAL DATA:  Shortness of breath with cough and congestion EXAM: PORTABLE CHEST 1 VIEW COMPARISON:  Chest radiograph April 21, 2016 and chest CT April 22, 2016 FINDINGS: There is scarring in each upper lobe and right mid lung region, stable. Lungs overall appear mildly hyperexpanded. There is no frank edema or consolidation. There is a the focal opacity in the periphery of the right mid lung measuring 1.2 x 1.0 cm which is felt to correspond to the lesions seen on CT 1 day prior. This lesion is better seen by CT. Heart size and pulmonary vascularity are within normal limits. No adenopathy evident. IMPRESSION: Nodular opacity in the periphery the right mid lung, better seen on CT 1 day prior. Areas of scarring, primarily in the upper lobes near the apices. Lungs hyperexpanded. No edema or consolidation. Stable cardiac silhouette. Electronically Signed   By: Lowella Grip III M.D.  On: 04/23/2016 07:30   Dg Chest Portable 1 View  04/21/2016  CLINICAL DATA:  Dyspnea EXAM: PORTABLE CHEST 1 VIEW COMPARISON:  04/13/2016 FINDINGS: Cardiac shadow is stable. Mild interstitial changes are noted  somewhat accentuated by the portable technique. No focal confluent infiltrate or sizable effusion is noted. No bony abnormality is seen. IMPRESSION: No change from the previous exam. Electronically Signed   By: Inez Catalina M.D.   On: 04/21/2016 16:46    IMPRESSION: Principal Problem:   Atrial fibrillation with RVR (HCC) Active Problems:   COPD with acute exacerbation (HCC)   COPD (chronic obstructive pulmonary disease) (HCC)   PAF (paroxysmal atrial fibrillation) (HCC)   HX: anticoagulation   Chronic respiratory failure (HCC)   Noninfectious gastroenteritis and colitis   Altered mental status   GERD (gastroesophageal reflux disease)   Peripheral vascular disease, unspecified (HCC)   Dyslipidemia   S/P aortobifemoral bypass surgery   Tobacco user   CAD (coronary artery disease), native coronary artery   Hyponatremia   Protein-calorie malnutrition, severe   RECOMMENDATION: Continue IV Diltiazem, doubt he can take more Lopressor with COPD. Keep K+ close to 4, Mg++ 1.8, replacement already ordered and extra Lanoxin given.    Time Spent Directly with Patient: 45 minutes  Kenneth Merritt, [place]   Pt well known to [place], followed by Dr Ron Parker in [place]. H/O PAF on BB and Eliquis. H/O COPD and vasc disease. Admitted with weakness and resp insuff. Was in Afib with RVR now SR/ST. On dig as OP as well. IV dilt started Exam notable for scattered wheezes and rhonchi. Getting approp Rx for COPD exacerbation. Nl EF by 2D in past. Will follow along with you. Rhythm should stabilize once pulm issues adequately addressed.   [phone] beeper 04/23/2016, 12:36 PM

## 2016-04-23 NOTE — Progress Notes (Signed)
RT attempted to place patient on Bipap, but patient stated he is not wearing it. NP & RN notified. Bipap in room if needed.

## 2016-04-23 NOTE — Progress Notes (Signed)
Comments:  HR now in 150's (A-fib w/ rvr). Refractory to second dose of IV Digoxin. SBP's 80-100. Spoke w/ Grandville Silos, PA w/ cardiology service. Cards to see pt this am. Appreciate cardiology input. Will continue to monitor closely on SDU.      Jeryl Columbia, Np-C [facility] Pager [phone]

## 2016-04-23 NOTE — Progress Notes (Signed)
Pharmacy Antibiotic Note  Kenneth Merritt is a 66 y.o. male admitted on 04/21/2016 with SOB and AFib. Planning to start antibiotics for pneumonia. SCr 0.47, eCrCl > 70 ml/min, afebrile.   Plan: -Unasyn 3g IV q8h -Monitor renal fx, cultures, duration of therapy   Weight: 138 lb 3.7 oz (62.7 kg)  Temp (24hrs), Avg:97.6 F (36.4 C), Min:97.3 F (36.3 C), Max:97.9 F (36.6 C)   Recent Labs Lab 04/21/16 1807 04/22/16 0212 04/23/16 0233  WBC 12.1* 8.8 6.7  CREATININE 0.98 0.76 0.47*    Estimated Creatinine Clearance: 78.1 mL/min (by C-G formula based on Cr of 0.47).    Allergies  Allergen Reactions  . Levaquin [Levofloxacin In D5w] Other (See Comments)    Pt states it caused dry mouth and rectal bleeding.     Antimicrobials this admission: 6/14 Unasyn > 6/13 Flagyl x1  Dose adjustments this admission: NA  Microbiology results: 6/12 mrsa: neg  Thank you for allowing pharmacy to be a part of this patient's care.  Harvel Quale 04/23/2016 9:44 AM

## 2016-04-23 NOTE — Consult Note (Signed)
PULMONARY / CRITICAL CARE MEDICINE   Name: Kenneth Merritt MRN: [id number] DOB: 06/12/1951    ADMISSION DATE:  04/21/2016 CONSULTATION DATE:  6/14  REFERRING MD:  Doyle Askew ([facility])   CHIEF COMPLAINT:  Respiratory failure, AFib with RVR   HISTORY OF PRESENT ILLNESS:   65yo male smoker with hx AFib on eliquis, PVD, COPD on home O2, chronic pain, recent admission 5/28-6/6 for sepsis r/t diverticulitis.  Returned 6/12 with weakness, SOB, cough.  In ER was noted to be in AFib with RVR and mild hypotension.  He was admitted to SDU by [facility] and started on cardizem gtt.  Cardizem gtt was stoped 6/13 ??r/t conversion back to NSR.  On 6/14 pt had progressive lethargy and ongoing Afib with RVR.  Cardizem gtt resumed and PCCM consulted.      PAST MEDICAL HISTORY :  He  has a past medical history of GERD (gastroesophageal reflux disease); COPD (chronic obstructive pulmonary disease) ([place]); S/P aortobifemoral bypass surgery; Atrial fibrillation ([place]); Renal artery stenosis ([place]); Tobacco user; Pneumothorax; Peripheral vascular disease, unspecified ([place]); CAD (coronary artery disease), native coronary artery; Dyslipidemia; DJD (degenerative joint disease); Ejection fraction; Cancer [facility]) (2013); Myocardial infarction [facility]) (2011); On home O2; Chronic thoracic back pain; and Chronic pain syndrome.  PAST SURGICAL HISTORY: He  has past surgical history that includes Abdominal aortic aneurysm repair (01/11/2010); vein bypass graft,aorto-fem-pop; and Inguinal hernia repair.  Allergies  Allergen Reactions  . Levaquin [Levofloxacin In D5w] Other (See Comments)    Pt states it caused dry mouth and rectal bleeding.     No current facility-administered medications on file prior to encounter.   Current Outpatient Prescriptions on File Prior to Encounter  Medication Sig  . acetaminophen (TYLENOL) 500 MG tablet Take 1,000 mg by mouth every 6 (six) hours as needed for mild pain.  [name] albuterol (PROVENTIL HFA;VENTOLIN HFA)  108 (90 BASE) MCG/ACT inhaler Inhale 2 puffs into the lungs every 4 (four) hours as needed for wheezing or shortness of breath.  [name] albuterol (PROVENTIL) (2.5 MG/3ML) 0.083% nebulizer solution Take 3 mLs (2.5 mg total) by nebulization every 4 (four) hours as needed for wheezing or shortness of breath.  . ALPRAZolam (XANAX) 0.25 MG tablet Take 0.25 mg by mouth 3 (three) times daily as needed for anxiety or sleep.   . ciprofloxacin (CIPRO) 250 MG tablet Take 1 tablet (250 mg total) by mouth 2 (two) times daily. For 5days  . digoxin (LANOXIN) 0.125 MG tablet Take 1 tablet (0.125 mg total) by mouth daily.  [name] ELIQUIS 5 MG TABS tablet TAKE 1 TABLET BY MOUTH TWICE A DAY  . furosemide (LASIX) 20 MG tablet Take 1 tablet (20 mg total) by mouth daily. For 3days then as needed for swelling/weight gain of >2lbs overnight or 4-5lbs in 1 week  . gabapentin (NEURONTIN) 300 MG capsule Take 300 mg by mouth 3 (three) times daily.  [name] HYDROcodone-acetaminophen (NORCO/VICODIN) 5-325 MG tablet Take 1-2 tablets by mouth every 6 (six) hours as needed for moderate pain or severe pain.  [name] ipratropium (ATROVENT) 0.02 % nebulizer solution Take 0.5 mg by nebulization every 4 (four) hours as needed for wheezing or shortness of breath.  . metoprolol (LOPRESSOR) 50 MG tablet Take 50 mg by mouth 2 (two) times daily.  . metroNIDAZOLE (FLAGYL) 250 MG tablet Take 1 tablet (250 mg total) by mouth every 8 (eight) hours. For 5days  . nitroGLYCERIN (NITROSTAT) 0.4 MG SL tablet Place 1 tablet (0.4 mg total) under the tongue every 5 (five)  minutes x 3 doses as needed for chest pain.  [name] omeprazole (PRILOSEC) 20 MG capsule Take 20 mg by mouth every morning.   [name] oxyCODONE (OXYCONTIN) 15 mg 12 hr tablet Take 1 tablet (15 mg total) by mouth every 12 (twelve) hours.  . predniSONE (DELTASONE) 20 MG tablet Take 1-2 tablets (20-40 mg total) by mouth daily. Take 40mg  for 3days then 20mg  for 3days then STOP  . simvastatin (ZOCOR) 40 MG tablet TAKE 1  TABLET (40 MG TOTAL) BY MOUTH EVERY EVENING.  [name] levalbuterol (XOPENEX HFA) 45 MCG/ACT inhaler Inhale 2 puffs into the lungs every 4 (four) hours as needed for wheezing. (Patient not taking: Reported on 04/17/2016)  . metFORMIN (GLUCOPHAGE) 1000 MG tablet Take 1,000 mg by mouth daily with breakfast.    FAMILY HISTORY:  His indicated that his mother is deceased. He indicated that his father is deceased.   SOCIAL HISTORY: He  reports that he quit smoking about 6 years ago. His smoking use included Cigarettes. He has a 80 pack-year smoking history. He has never used smokeless tobacco. He reports that he does not drink alcohol or use illicit drugs.  REVIEW OF SYSTEMS:   Unable.  Pt lethargic.   SUBJECTIVE:    VITAL SIGNS: BP 98/61 mmHg  Pulse 129  Temp(Src) 97.3 F (36.3 C) (Oral)  Resp 26  Wt 62.7 kg (138 lb 3.7 oz)  SpO2 98%  HEMODYNAMICS:    VENTILATOR SETTINGS:    INTAKE / OUTPUT: I/O last 3 completed shifts: In: 818.9 [P.O.:400; I.V.:18.9; IV Piggyback:400] Out: 2425 [Urine:2425]  PHYSICAL EXAMINATION: General:  Chronically ill appearing male, somnolent but NAD  Neuro:  Lethargic, arouses to loud voice, answers questions appropriately but quickly falls back to sleep, MAE, gen weakness  HEENT:  Mm moist, no JVD  Cardiovascular:  s1s2 irreg, AFib with RVR 130's Lungs:  resps even, mildly tachypneic on [place], no sig increased WOB, wet cough, diminished throughout, few bibasilar crackles, few exp wheeze Abdomen:  Round, soft, non tender, +bs  Musculoskeletal:  Warm and dry, no edema   LABS:  BMET  Recent Labs Lab 04/21/16 1807 04/22/16 0212 04/23/16 0233  NA 132* 134* 136  K 3.3* 3.1* 3.2*  CL 92* 94* 95*  CO2 29 33* 35*  BUN 12 6 <5*  CREATININE 0.98 0.76 0.47*  GLUCOSE 137* 142* 114*    Electrolytes  Recent Labs Lab 04/21/16 1807 04/22/16 0212 04/23/16 0233  CALCIUM 7.7* 7.8* 7.6*  MG 1.5*  --   --     CBC  Recent Labs Lab 04/21/16 1807  04/22/16 0212 04/23/16 0233  WBC 12.1* 8.8 6.7  HGB 9.9* 9.1* 8.6*  HCT 30.8* 29.9* 28.1*  PLT 207 199 199    Coag's No results for input(s): APTT, INR in the last 168 hours.  Sepsis Markers No results for input(s): LATICACIDVEN, PROCALCITON, O2SATVEN in the last 168 hours.  ABG  Recent Labs Lab 04/23/16 0907  PHART 7.446  PCO2ART 50.2*  PO2ART 83.0    Liver Enzymes No results for input(s): AST, ALT, ALKPHOS, BILITOT, ALBUMIN in the last 168 hours.  Cardiac Enzymes  Recent Labs Lab 04/21/16 1807 04/21/16 2223 04/22/16 0212  TROPONINI 0.07* 0.06* 0.05*    Glucose  Recent Labs Lab 04/22/16 0907 04/22/16 1118 04/22/16 1639 04/22/16 2127 04/23/16 0844  GLUCAP 129* 135* 155* 145* 141*    Imaging Ct Chest Wo Contrast  04/22/2016  CLINICAL DATA:  Known chronic atrial fibrillation, peripheral vascular disease, status post aortofemoral  bypass. Hospitalized 5/28 through 6/6 for sepsis. Dyspnea. EXAM: CT CHEST WITHOUT CONTRAST TECHNIQUE: Multidetector CT imaging of the chest was performed following the standard protocol without IV contrast. COMPARISON:  Chest CT dated 03/27/2016 FINDINGS: Mediastinum/Lymph Nodes: Scattered atherosclerotic changes of the normal-caliber thoracic aorta. Heart size is normal. No pericardial effusion. Coronary artery calcifications noted. Scattered small lymph nodes within the mediastinum, none of which are pathologic by CT size criteria. Esophagus appears normal. Trachea and central bronchi are unremarkable. Lungs/Pleura: Emphysematous changes bilaterally, upper lobe predominant. New irregular nodule within the superior segment of the right upper lobe, measuring 1.7 x 0.8 cm (series 3, image 63). Additional new nodular consolidation more inferiorly within the right lower lobe, measures approximately 1.8 x 0.7 cm, possibly atelectasis or confluent scarring (series 3, image 104). Confluent scarring/ fibrosis noted at each lung apex. Upper abdomen:  Small stone within the gallbladder. No evidence of acute cholecystitis. Limited images of the upper abdomen are otherwise unremarkable. Musculoskeletal: Chronic-appearing compression fracture deformities noted within the mid thoracic spine, slightly progressed at the T6 vertebral body level since recent chest CT of 03/27/2016, not significantly changed at the T7 and T8 vertebral body levels. No acute-appearing osseous abnormality. IMPRESSION: 1. New irregular nodular density within the superior segment of the right upper lobe, measuring 1.7 x 0.8 x 0.8 cm, suspicious for neoplastic nodule. Consider PET-CT for further characterization. Recommend surgical consultation for further workup considerations. 2. Additional new ill-defined nodular consolidation more inferiorly within the right lower lobe, measuring approximately 1.8 x 0.7 x 2.1 cm. This may also represent neoplastic process but is less suspicious, perhaps atelectasis or confluent scarring. PET-CT may be helpful for this finding as well. 3. Emphysema, upper lobe predominant. 4. Coronary artery calcifications, particularly dense within the left anterior descending coronary artery. Recommend correlation with any possible associated cardiac symptoms. 5. Cholelithiasis without evidence of acute cholecystitis. 6. Chronic-appearing compression fracture deformities of the T6 through T8 vertebral bodies. T6 compression has slightly progressed compared to the previous exam of 03/27/2016. T7 and T8 compression deformities appear stable. These results will be called to the ordering clinician or representative by the Radiologist Assistant, and communication documented in the PACS or zVision Dashboard. Electronically Signed   By: Franki Cabot M.D.   On: 04/22/2016 18:21   Dg Chest Port 1 View  04/23/2016  CLINICAL DATA:  Shortness of breath with cough and congestion EXAM: PORTABLE CHEST 1 VIEW COMPARISON:  Chest radiograph April 21, 2016 and chest CT April 22, 2016  FINDINGS: There is scarring in each upper lobe and right mid lung region, stable. Lungs overall appear mildly hyperexpanded. There is no frank edema or consolidation. There is a the focal opacity in the periphery of the right mid lung measuring 1.2 x 1.0 cm which is felt to correspond to the lesions seen on CT 1 day prior. This lesion is better seen by CT. Heart size and pulmonary vascularity are within normal limits. No adenopathy evident. IMPRESSION: Nodular opacity in the periphery the right mid lung, better seen on CT 1 day prior. Areas of scarring, primarily in the upper lobes near the apices. Lungs hyperexpanded. No edema or consolidation. Stable cardiac silhouette. Electronically Signed   By: Lowella Grip III M.D.   On: 04/23/2016 07:30     STUDIES:  CT chest 6/13>>> New irregular nodular density within the superior segment of the right upper lobe, measuring 1.7 x 0.8 x 0.8 cm, suspicious for neoplastic nodule.  2. Additional new ill-defined nodular consolidation  more inferiorly within the right lower lobe, measuring approximately 1.8 x 0.7 x 2.1 cm. This may also represent neoplastic process but is less suspicious, perhaps atelectasis or confluent scarring.  Emphysema, upper lobe predominant.  CULTURES:   ANTIBIOTICS:   SIGNIFICANT EVENTS:   LINES/TUBES:   DISCUSSION: 65yo male with hx COPD, recent sepsis r/t diverticulitis now admitted with acute respiratory failure and progressive lethargy in setting persistent AFib with RVR.    ASSESSMENT / PLAN:  PULMONARY Acute respiratory failure - multifactorial in setting persistent AFib with RVR and probable AECOPD AECOPD  RUL nodule  Compensated respiratory acidosis  ?aspiration  P:   abg now  bipap PRN HR control as below  BD's - xopenex, atrovent  Will add IV solumedrol 60mg  q8h  Pulmonary hygiene  Consider diuresis- will hold for now with no obvious edema on CXR and marginal BP  Supplemental O2  Consider EBUS   F/u  CXR  Will add empiric coverage for ?aspiration   CARDIOVASCULAR AFib with RVR - difficult to control.  CHADS2 score 4 P:  cardizem gtt -- titrate up to 15mg /hr  Continue digoxin  Metoprolol 2.5mg  IV x1 now  Cards to see  Trend troponin  BNP  Continue apixaban   RENAL Hypokalemia  Hyponatremia  HypoMg -- ?repleted  P:   F/u chem  Replete K PRN  Mg 2gm IV x1 now   GASTROINTESTINAL Protein calorie malnutrition  Dysphagia  P:   NPO for now  Speech following  Hold off on barium swallow scheduled today   HEMATOLOGIC Anemia  P:  F/u CBC  apixaban   INFECTIOUS ?aspiration  P:   Unasyn as above - low threshold to broaden with recent admission    ENDOCRINE DM    P:   SSI   NEUROLOGIC AMS -- r/t mild hypercarbia, respiratory failure.   P:   D/c all sedating meds  bipap as above  F/u ABG    FAMILY  - Updates: family updated via phone per RN 6/14  - Inter-disciplinary family meet or Palliative Care meeting due by:  6/21   Nickolas Madrid, NP 04/23/2016  9:29 AM Pager: [phone] or [phone]    65 yo male had recent admit for sepsis and diverticulitis.  Returned with weakness, dyspnea, cough.  Found to have A fib with RVR and lethargy.  Sleepy, follows simple commands, able to speak in short sentences.  HR irregular.  Decreased breath sounds, no wheeze.  Abd soft, non tender.  No edema.  CT chest - emphysema, and Rt upper and lower lung nodular infiltrate  ABG - pH 7.44, PCO2 50.2, PO2 83  Creatinine 0.47, Hb 8.6  Assessment/plan:  A fib with RVR. - cardizem, digoxin - f/u with cardiology  AECOPD. - Bipap prn - solumedrol, bds  Rt lung nodular infiltrates with concern for aspiration. - continue Abx - f/u as outpt >> if no improvement, then will need PET scan  Acute metabolic encephalopathy. - monitor mental status  Chesley Mires, MD [place] 04/23/2016, 12:20 PM Pager:  [phone] After 3pm call:  [phone]

## 2016-04-23 NOTE — Progress Notes (Signed)
Nutrition Follow-up  DOCUMENTATION CODES:   Severe malnutrition in context of acute illness/injury  INTERVENTION:   -RD will supplement diet as appropriate once diet is advanced -If prolonged NPO status is expected, consider:  Initiate Jevity 1.2 @ 25 ml/hr via NGT and increase by 10 ml every 12 hours to goal rate of 55 ml/hr.   30 ml Prostat daily.    Tube feeding regimen provides 1684 kcal (100% of needs), 88 grams of protein, and 1065 ml of H2O.   NUTRITION DIAGNOSIS:   Inadequate oral intake related to poor appetite, altered GI function as evidenced by percent weight loss.  Ongoing  GOAL:   Patient will meet greater than or equal to 90% of their needs  Unmet  MONITOR:   PO intake, Supplement acceptance, Labs, Weight trends, Skin, I & O's  REASON FOR ASSESSMENT:   Consult Assessment of nutrition requirement/status  ASSESSMENT:   65 y.o. male with past medical history significant for A. fib and coronary artery disease presented to the emergency room with chief complaint of shortness of breath of note the patient had been recently discharged from the hospital with a diagnosis of colitis. Patient states that after that leaving the hospital he never quite got her strength back. He has continued to have diarrhea over the last few days mostly watery.   RD received consult to assess nutrition requirements and status. Noted pt was last evaluated by RD on 04/22/16; refer to initial assessment for further details.   Pt very lethargic at visit and unable to provide hx.   Case discussed with RN. She reports that pt is very unstable and experiencing decline in respiratory status. Pt is now NPO. Originally pt was to have swallow evaluation today, however, now on hold due to instability.   Per PCCM notes, plan to check ABG. Bipap is on standby in pt room.   Labs reviewed.   Diet Order:  Diet NPO time specified  Skin:  Reviewed, no issues  Last BM:  6/13  Height:   Ht  Readings from Last 1 Encounters:  04/06/16 5\' 4"  (1.626 m)    Weight:   Wt Readings from Last 1 Encounters:  04/23/16 138 lb 3.7 oz (62.7 kg)    Ideal Body Weight:  59.1 kg  BMI:  Body mass index is 23.72 kg/(m^2).  Estimated Nutritional Needs:   Kcal:  1600-1800  Protein:  85-95 grams  Fluid:  1.6-1.8 L/day  EDUCATION NEEDS:   No education needs identified at this time  Dazha Kempa A. Jimmye Norman, RD, LDN, CDE Pager: [phone] After hours Pager: [phone]

## 2016-04-23 NOTE — Progress Notes (Signed)
Cardiology has been consulted. I have not yet seen the patient in person. I have reviewed with the nurse by phone. We will restart IV diltiazem. It is my preference that we not give any diuretics at this time. I'm concerned that the patient's pulmonary status is the primary issue. I've spoken with Dr. Doyle Askew. She has informed me that pulmonary critical care medicine is being consulted to help with the pulmonary status. More complete evaluation and a note from our team will follow.  Daryel November, MD

## 2016-04-23 NOTE — Progress Notes (Signed)
Pt asked for breathing treatment felt SOB, had RT come in to assist around 0550; pts HR went from 100's to 130s at this time. Text paged [facility] MD about HR & respiratory status. Received orders for digoxin, xopenex, and CXR. RN stayed in pts room while giving concerning HR stayed in 140s. HR increased and sustained in the 170s-180s pt remained asymptomatic at this time. Text paged MD again concerning HR in 180s. ELink RN viewed pt as to HR concerns. [facility] MD called RN back no new orders received, MD notified RN that cardiology consult had been made and that they would be the ones to address HR status.

## 2016-04-24 ENCOUNTER — Inpatient Hospital Stay (HOSPITAL_COMMUNITY): Payer: Medicare Other

## 2016-04-24 DIAGNOSIS — J9602 Acute respiratory failure with hypercapnia: Secondary | ICD-10-CM

## 2016-04-24 DIAGNOSIS — R400 Somnolence: Secondary | ICD-10-CM

## 2016-04-24 DIAGNOSIS — J9601 Acute respiratory failure with hypoxia: Secondary | ICD-10-CM

## 2016-04-24 DIAGNOSIS — J441 Chronic obstructive pulmonary disease with (acute) exacerbation: Secondary | ICD-10-CM

## 2016-04-24 LAB — GLUCOSE, CAPILLARY
GLUCOSE-CAPILLARY: 164 mg/dL — AB (ref 65–99)
GLUCOSE-CAPILLARY: 137 mg/dL — AB (ref 65–99)
Glucose-Capillary: 237 mg/dL — ABNORMAL HIGH (ref 65–99)
Glucose-Capillary: 163 mg/dL — ABNORMAL HIGH (ref 65–99)

## 2016-04-24 LAB — MAGNESIUM: MAGNESIUM: 2.1 mg/dL (ref 1.7–2.4)

## 2016-04-24 MED ORDER — POTASSIUM CHLORIDE CRYS ER 20 MEQ PO TBCR
40.0000 meq | EXTENDED_RELEASE_TABLET | Freq: Once | ORAL | Status: AC
  Administered 2016-04-24: 40 meq via ORAL
  Filled 2016-04-24: qty 2

## 2016-04-24 MED ORDER — LEVALBUTEROL HCL 1.25 MG/0.5ML IN NEBU
1.2500 mg | INHALATION_SOLUTION | Freq: Four times a day (QID) | RESPIRATORY_TRACT | Status: DC
  Administered 2016-04-24 – 2016-04-27 (×9): 1.25 mg via RESPIRATORY_TRACT
  Filled 2016-04-24 (×11): qty 0.5

## 2016-04-24 MED ORDER — IPRATROPIUM BROMIDE 0.02 % IN SOLN
0.5000 mg | Freq: Four times a day (QID) | RESPIRATORY_TRACT | Status: DC
  Administered 2016-04-24 – 2016-04-27 (×10): 0.5 mg via RESPIRATORY_TRACT
  Filled 2016-04-24 (×11): qty 2.5

## 2016-04-24 MED ORDER — HYDROCODONE-ACETAMINOPHEN 5-325 MG PO TABS
1.0000 | ORAL_TABLET | ORAL | Status: DC | PRN
  Administered 2016-04-24: 1 via ORAL
  Administered 2016-04-25: 2 via ORAL
  Administered 2016-04-26: 1 via ORAL
  Administered 2016-04-28 – 2016-04-30 (×2): 2 via ORAL
  Administered 2016-04-30: 1 via ORAL
  Administered 2016-04-30 – 2016-05-01 (×3): 2 via ORAL
  Filled 2016-04-24: qty 1
  Filled 2016-04-24 (×5): qty 2
  Filled 2016-04-24 (×2): qty 1
  Filled 2016-04-24 (×2): qty 2

## 2016-04-24 MED ORDER — METHYLPREDNISOLONE SODIUM SUCC 40 MG IJ SOLR
40.0000 mg | Freq: Three times a day (TID) | INTRAMUSCULAR | Status: DC
  Administered 2016-04-24 – 2016-04-25 (×3): 40 mg via INTRAVENOUS
  Filled 2016-04-24 (×2): qty 1

## 2016-04-24 MED ORDER — ALPRAZOLAM 0.25 MG PO TABS
0.2500 mg | ORAL_TABLET | Freq: Two times a day (BID) | ORAL | Status: DC | PRN
  Administered 2016-04-24 – 2016-04-26 (×5): 0.25 mg via ORAL
  Filled 2016-04-24 (×6): qty 1

## 2016-04-24 NOTE — Progress Notes (Signed)
Patient Name:  Kenneth Merritt, DOB: 10-03-1951, MRN: [id number] Primary Doctor: Neale Burly, MD Primary Cardiologist:   Date: 04/24/2016   SUBJECTIVE:  The patient is comfortable sitting up in bed.   Past Medical History  Diagnosis Date  . GERD (gastroesophageal reflux disease)   . COPD (chronic obstructive pulmonary disease) ([place])     severe by PFTs August 2010  . 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 Atrial fibrillation ([facility])     post-op a-fib. 3/11. post Aortobifem , /  Atrial fibrillation from nebulizer treatment  May, 2012, while hospitalized  . Renal artery stenosis ([place])      presumably corrected at time of aortobifem surgery March, 2011  . Tobacco user      stop smoking  . Pneumothorax      2011  before aortobifemoral surgery,  resolved  . Peripheral vascular disease, unspecified ([place])     bilateral intermittent claudication  . CAD (coronary artery disease), native coronary artery     catheterization 06/2009.Marland KitchenMarland Kitchen70% proximal LAD and 30% proximal RCA. normal LV function, no intervention planned prior to surgery for aorta. LV normal function  by catheter 8/10.   [name] Dyslipidemia   . DJD (degenerative joint disease)   . Ejection fraction     60%, echo, May, 2012, rapid atrial fib at the time  . Cancer [facility]) 2013    Prostate Radiation  . Myocardial infarction ([place]) 2011  . On home O2     2L N/C  . Chronic thoracic back pain   . Chronic pain syndrome    Filed Vitals:   04/24/16 0400 04/24/16 0401 04/24/16 0500 04/24/16 0600  BP: 101/66  90/62 88/61  Pulse: 70 74 78 76  Temp: 97.3 F (36.3 C)     TempSrc: Oral     Resp: 10 12 12 12   Weight: 133 lb 6.1 oz (60.5 kg)     SpO2: 100% 100% 97% 98%    Intake/Output Summary (Last 24 hours) at 04/24/16 0753 Last data filed at 04/24/16 0600  Gross per 24 hour  Intake 483.67 ml  Output   1100 ml  Net -616.33 ml   Filed  Weights   04/22/16 0356 04/23/16 0500 04/24/16 0400  Weight: 137 lb 12.6 oz (62.5 kg) 138 lb 3.7 oz (62.7 kg) 133 lb 6.1 oz (60.5 kg)     LABS: Basic Metabolic Panel:  Recent Labs  04/22/16 0212 04/23/16 0233 04/23/16 0751 04/24/16 0253  NA 134* 136  --   --   K 3.1* 3.2*  --   --   CL 94* 95*  --   --   CO2 33* 35*  --   --   GLUCOSE 142* 114*  --   --   BUN 6 <5*  --   --   CREATININE 0.76 0.47*  --   --   CALCIUM 7.8* 7.6*  --   --   MG  --   --  1.6* 2.1   Liver Function Tests: No results for input(s): AST, ALT, ALKPHOS, BILITOT, PROT, ALBUMIN in the last 72 hours. No results for input(s): LIPASE, AMYLASE in the last 72 hours. CBC:  Recent Labs  04/21/16 1807 04/22/16 0212 04/23/16 0233  WBC 12.1* 8.8 6.7  NEUTROABS 9.7*  --   --   HGB 9.9* 9.1* 8.6*  HCT 30.8* 29.9* 28.1*  MCV 87.0 88.2 88.4  PLT 207 199 199   Cardiac Enzymes:  Recent Labs  04/21/16 1807 04/21/16 2223 04/22/16 0212  TROPONINI 0.07* 0.06* 0.05*   BNP: Invalid input(s): POCBNP D-Dimer: No results for input(s): DDIMER in the last 72 hours. Thyroid Function Tests: No results for input(s): TSH, T4TOTAL, T3FREE, THYROIDAB in the last 72 hours.  Invalid input(s): FREET3  RADIOLOGY: Dg Chest 2 View  03/29/2016  CLINICAL DATA:  Continuing chest pain and difficulty breathing starting 4 days ago. EXAM: CHEST  2 VIEW COMPARISON:  Chest x-rays dated 03/27/2016, 03/19/2016 and 09/19/2015. FINDINGS: Heart size is normal. Overall cardiomediastinal silhouette is stable in size and configuration. Lungs are hyperexpanded. Suspect chronic bronchitic changes centrally. Small areas of chronic scarring/ fibrosis are seen along the right lateral chest wall on at each lung apex, better characterized on recent chest CT. No evidence of pneumonia. No pleural effusion or pneumothorax seen. There are chronic-appearing compression fracture deformities within the mid thoracic spine, but the more superior vertebral  body demonstrates slight progression of the compression compared to the earlier plain film exam of 11/20/2015. IMPRESSION: 1. Hyperexpanded lungs indicating COPD/emphysema. 2. No evidence of acute cardiopulmonary abnormality. No evidence of pneumonia. No pleural effusion or pneumothorax. 3. Chronic compression fracture deformity within the mid thoracic spine, but slightly increased compression compared to the earlier plain film of 11/20/2015. Any acute/recent symptoms within the mid thoracic spine? Milder compression deformity of the immediately subjacent thoracic vertebral body is stable. Electronically Signed   By: Franki Cabot M.D.   On: 03/29/2016 18:35   Dg Chest 2 View  03/27/2016  CLINICAL DATA:  LEFT-sided chest pain.  Increased short of breath EXAM: CHEST  2 VIEW COMPARISON:  03/19/2016 FINDINGS: Lungs are hyperinflated. Normal cardiac silhouette. No effusion, infiltrate pneumothorax. Biapical pleural thickening. Compression deformities in the mid thoracic spine with mild kyphosis. IMPRESSION: 1.  No acute cardiopulmonary process. 2. Hyperinflated lungs suggest emphysema. Electronically Signed   By: Suzy Bouchard M.D.   On: 03/27/2016 16:48   Ct Chest Wo Contrast  04/22/2016  CLINICAL DATA:  Known chronic atrial fibrillation, peripheral vascular disease, status post aortofemoral bypass. Hospitalized 5/28 through 6/6 for sepsis. Dyspnea. EXAM: CT CHEST WITHOUT CONTRAST TECHNIQUE: Multidetector CT imaging of the chest was performed following the standard protocol without IV contrast. COMPARISON:  Chest CT dated 03/27/2016 FINDINGS: Mediastinum/Lymph Nodes: Scattered atherosclerotic changes of the normal-caliber thoracic aorta. Heart size is normal. No pericardial effusion. Coronary artery calcifications noted. Scattered small lymph nodes within the mediastinum, none of which are pathologic by CT size criteria. Esophagus appears normal. Trachea and central bronchi are unremarkable. Lungs/Pleura:  Emphysematous changes bilaterally, upper lobe predominant. New irregular nodule within the superior segment of the right upper lobe, measuring 1.7 x 0.8 cm (series 3, image 63). Additional new nodular consolidation more inferiorly within the right lower lobe, measures approximately 1.8 x 0.7 cm, possibly atelectasis or confluent scarring (series 3, image 104). Confluent scarring/ fibrosis noted at each lung apex. Upper abdomen: Small stone within the gallbladder. No evidence of acute cholecystitis. Limited images of the upper abdomen are otherwise unremarkable. Musculoskeletal: Chronic-appearing compression fracture deformities noted within the mid thoracic spine, slightly progressed at the T6 vertebral body level since recent chest CT of 03/27/2016, not significantly changed at the T7 and T8 vertebral body levels. No acute-appearing osseous abnormality. IMPRESSION: 1. New irregular nodular density within the superior segment of the right upper lobe, measuring 1.7 x 0.8 x  0.8 cm, suspicious for neoplastic nodule. Consider PET-CT for further characterization. Recommend surgical consultation for further workup considerations. 2. Additional new ill-defined nodular consolidation more inferiorly within the right lower lobe, measuring approximately 1.8 x 0.7 x 2.1 cm. This may also represent neoplastic process but is less suspicious, perhaps atelectasis or confluent scarring. PET-CT may be helpful for this finding as well. 3. Emphysema, upper lobe predominant. 4. Coronary artery calcifications, particularly dense within the left anterior descending coronary artery. Recommend correlation with any possible associated cardiac symptoms. 5. Cholelithiasis without evidence of acute cholecystitis. 6. Chronic-appearing compression fracture deformities of the T6 through T8 vertebral bodies. T6 compression has slightly progressed compared to the previous exam of 03/27/2016. T7 and T8 compression deformities appear stable. These  results will be called to the ordering clinician or representative by the Radiologist Assistant, and communication documented in the PACS or zVision Dashboard. Electronically Signed   By: Franki Cabot M.D.   On: 04/22/2016 18:21   Ct Angio Chest Pe W/cm &/or Wo Cm  03/27/2016  CLINICAL DATA:  65 year old male with COPD with increasing shortness of breath. EXAM: CT ANGIOGRAPHY CHEST WITH CONTRAST TECHNIQUE: Multidetector CT imaging of the chest was performed using the standard protocol during bolus administration of intravenous contrast. Multiplanar CT image reconstructions and MIPs were obtained to evaluate the vascular anatomy. CONTRAST:  100 cc Isovue 370 COMPARISON:  Chest radiograph dated 03/27/2016 FINDINGS: Evaluation of this exam is limited due to respiratory motion artifact. There is emphysematous changes of the lungs with biapical scarring. There is no focal consolidation, pleural effusion, or pneumothorax. The central airways are patent. There is mild atherosclerotic calcification of the thoracic aorta. No aneurysmal dilatation or dissection. The origins of the great vessels of the aortic arch appear patent. No CT evidence of pulmonary embolism. Evaluation however is limited due to respiratory motion artifact. There is no cardiomegaly or pericardial effusion. No hilar or mediastinal adenopathy. The esophagus and the thyroid gland are grossly unremarkable. There is no axillary adenopathy. The chest wall soft tissues appear unremarkable. There is osteopenia with degenerative changes of the spine. There is age indeterminate compression deformity of the T7 vertebra with approximately 50% loss of vertebral body height and anterior wedging. Clinical correlation is recommended. Mild age indeterminate compression deformities of the T8 and T9 vertebrae is also noted. No definite acute fracture identified. Review of the MIP images confirms the above findings. IMPRESSION: No CT evidence of pulmonary embolism.  Emphysema.  No focal consolidation or pneumothorax. Age indeterminate compression fracture of the T7 vertebrae. Clinical correlation is recommended. Electronically Signed   By: Anner Crete M.D.   On: 03/27/2016 22:24   Ct Abdomen Pelvis W Contrast  04/07/2016  CLINICAL DATA:  Left flank pain. Lethargy, frequent falls. Elevated white blood cell count. EXAM: CT ABDOMEN AND PELVIS WITH CONTRAST TECHNIQUE: Multidetector CT imaging of the abdomen and pelvis was performed using the standard protocol following bolus administration of intravenous contrast. CONTRAST:  171mL ISOVUE-300 IOPAMIDOL (ISOVUE-300) INJECTION 61% COMPARISON:  Chest CT 03/27/2016. Report from CT abdomen/ pelvis 12/26/2012, images not available. CT 03/03/2012 FINDINGS: Lower chest: Consolidation in the lingula with air bronchograms, new from chest CT 10 days prior. Small focus of tree in bud opacities in the right lower lobe is also new. Right middle lobe scarring is unchanged. Liver: No focal lesion. Hepatobiliary: Gallbladder distention but no calcified stone or pericholecystic inflammation. Common bile duct normal in caliber. Pancreas: No ductal dilatation or inflammation. Spleen: Normal in size, no focal  lesion. Adrenal glands: No nodule. Kidneys: Symmetric renal enhancement and excretion. No hydronephrosis. No perinephric stranding or focal abnormality. Stomach/Bowel: Stomach physiologically distended. There are no dilated or thickened small bowel loops. Mild soft tissue stranding in the left pericolic gutter, there is a questionable colonic wall thickening and small diverticulum at the junction of the descending and sigmoid colon best depicted on coronal image 53 series 602. This is in the region of the pericolonic fat stranding. No definite diverticular disease elsewhere. No pneumatosis or free air. Portions of the appendix are visualized and normal. There is no right-sided inflammation. Vascular/Lymphatic: No retroperitoneal adenopathy.  Post abdominal aortobifemoral bypass graft no periaortic soft tissue stranding. The graft appears patent. Aortic dimensions at the proximal aspect of the graft 2.3 cm maximal dimension. No large vessel mesenteric occlusion. The IMA is not seen, likely excluded postsurgical. Celiac and superior mesenteric arteries are patent. Reproductive:  Brachytherapy seeds in the prostate gland. Bladder: Distended.  No bladder wall thickening. Other: No free air, free fluid, or intra-abdominal fluid collection. Musculoskeletal: There are no acute or suspicious osseous abnormalities. Avascular necrosis of both femoral heads. Question subchondral collapse on the right. IMPRESSION: 1. Soft tissue stranding in the left pericolic gutter. There short segment colonic wall thickening with possible small diverticulum at the junction of the descending and sigmoid colon a region of fat stranding. Differential considerations include mild acute diverticulitis, however no significant diverticular burden is seen elsewhere. Focal colitis is considered, with distribution suggesting ischemic etiology. 2. 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 3. Lingular consolidation is new from chest CT eleven days prior. Additional right lower lobe tree in bud opacities. Findings suspicious for infectious etiology/ pneumonia and bronchiolitis. Electronically Signed   By: Jeb Levering M.D.   On: 04/07/2016 01:51   Dg Chest Portable 1 View  04/24/2016  CLINICAL DATA:  Respiratory failure.  Shortness of breath. EXAM: PORTABLE CHEST 1 VIEW COMPARISON:  04/23/2016.  CT 04/22/2016. FINDINGS: Mediastinum hilar structures normal. Heart size normal. Previously identified nodular density in the right mid lung is again best identified by CT. No pleural effusion or pneumothorax. No acute bony abnormality. IMPRESSION: 1. No acute cardiopulmonary disease. 2. Previously identified small right mid lung nodular density is again best identified by prior  CT of 04/22/2016. Reference is made to that report. Electronically Signed   By: Marcello Moores  Register   On: 04/24/2016 07:37   Dg Chest Port 1 View  04/23/2016  CLINICAL DATA:  Shortness of breath with cough and congestion EXAM: PORTABLE CHEST 1 VIEW COMPARISON:  Chest radiograph April 21, 2016 and chest CT April 22, 2016 FINDINGS: There is scarring in each upper lobe and right mid lung region, stable. Lungs overall appear mildly hyperexpanded. There is no frank edema or consolidation. There is a the focal opacity in the periphery of the right mid lung measuring 1.2 x 1.0 cm which is felt to correspond to the lesions seen on CT 1 day prior. This lesion is better seen by CT. Heart size and pulmonary vascularity are within normal limits. No adenopathy evident. IMPRESSION: Nodular opacity in the periphery the right mid lung, better seen on CT 1 day prior. Areas of scarring, primarily in the upper lobes near the apices. Lungs hyperexpanded. No edema or consolidation. Stable cardiac silhouette. Electronically Signed   By: Lowella Grip III M.D.   On: 04/23/2016 07:30   Dg Chest Portable 1 View  04/21/2016  CLINICAL DATA:  Dyspnea EXAM: PORTABLE CHEST 1 VIEW COMPARISON:  04/13/2016 FINDINGS: Cardiac shadow is stable. Mild interstitial changes are noted somewhat accentuated by the portable technique. No focal confluent infiltrate or sizable effusion is noted. No bony abnormality is seen. IMPRESSION: No change from the previous exam. Electronically Signed   By: Inez Catalina M.D.   On: 04/21/2016 16:46   Dg Chest Port 1 View  04/13/2016  CLINICAL DATA:  Dyspnea for 1 hour EXAM: PORTABLE CHEST 1 VIEW COMPARISON:  04/13/2016 FINDINGS: Advanced emphysema and biapical scarring. No evidence of acute superimposed pneumonia or edema. No effusion or pneumothorax (linear lucency at both bases attributed of Mach effect). Normal heart size and mediastinal contours. IMPRESSION: Emphysema and lung scarring without acute superimposed  finding. Electronically Signed   By: Monte Fantasia M.D.   On: 04/13/2016 22:49   Dg Chest Port 1 View  04/11/2016  CLINICAL DATA:  Short of breath EXAM: PORTABLE CHEST 1 VIEW COMPARISON:  04/11/2016 FINDINGS: COPD with pulmonary hyperinflation apical scarring is unchanged. No superimposed infiltrate or effusion. Negative for mass or adenopathy. IMPRESSION: COPD, stable.  No superimposed acute abnormality. Electronically Signed   By: Franchot Gallo M.D.   On: 04/11/2016 08:08   Dg Chest Port 1 View  04/10/2016  CLINICAL DATA:  Shortness of Breath EXAM: PORTABLE CHEST 1 VIEW COMPARISON:  04/07/2016 FINDINGS: Cardiac shadow is stable. The lungs are well aerated bilaterally. No focal infiltrate or sizable effusion is seen. No acute bony abnormality is noted. IMPRESSION: No acute abnormality noted. Electronically Signed   By: Inez Catalina M.D.   On: 04/10/2016 08:09   Dg Chest Port 1 View  04/07/2016  CLINICAL DATA:  Worsening shortness of breath. EXAM: PORTABLE CHEST 1 VIEW COMPARISON:  Radiograph yesterday at 1832 hours. Chest CT 03/27/2016 FINDINGS: Diminished vascular congestion from prior exam. Improved left basilar aeration with minimal residual patchy opacity. The lingular opacity on CT is not well seen. Hyperinflation and emphysema. Biapical and mid lung zone scarring again seen. Cardiomediastinal contours are unchanged. IMPRESSION: Decreasing pulmonary vascular congestion. Lingular opacity on CT is not well seen. Minimal patchy left basilar opacity, with mild improvement from prior radiograph. Electronically Signed   By: Jeb Levering M.D.   On: 04/07/2016 05:55   Dg Chest Portable 1 View  04/06/2016  CLINICAL DATA:  Status post fall, with worsening shortness of breath. Syncope and lower extremity weakness. Initial encounter. EXAM: PORTABLE CHEST 1 VIEW COMPARISON:  Chest radiograph performed 04/02/2016 FINDINGS: The lungs are well-aerated. Vascular congestion is noted. Mild left basilar airspace  opacity could reflect pneumonia. Mild right midlung and right apical scarring is noted. There is no evidence of pleural effusion or pneumothorax. The cardiomediastinal silhouette is within normal limits. No acute osseous abnormalities are seen. IMPRESSION: Vascular congestion noted. Mild left basilar opacity could reflect pneumonia. Mild right midlung and right apical scarring noted. Electronically Signed   By: Garald Balding M.D.   On: 04/06/2016 19:00   Dg Chest Port 1 View  04/02/2016  CLINICAL DATA:  Shortness of breath, emphysema, COPD EXAM: PORTABLE CHEST 1 VIEW COMPARISON:  03/31/2016 FINDINGS: The heart size and mediastinal contours are within normal limits. Both lungs are clear. The visualized skeletal structures are unremarkable. IMPRESSION: No active disease. Electronically Signed   By: Kathreen Devoid   On: 04/02/2016 13:14   Dg Abd Acute W/chest  04/13/2016  CLINICAL DATA:  Diarrhea, nausea, shortness of breath. Increased weakness for 1 day. EXAM: DG ABDOMEN ACUTE W/ 1V CHEST COMPARISON:  04/11/2016 FINDINGS: Gas within mildly prominent colon  and nondilated small bowel loops. No free air organomegaly. Mild hyperinflation of the lungs compatible with COPD. Heart is normal size. Biapical scarring. No confluent opacities or effusions. No acute bony abnormality. IMPRESSION: No evidence of bowel obstruction or free air. COPD/chronic changes.  No active disease. Electronically Signed   By: Rolm Baptise M.D.   On: 04/13/2016 10:35   Dg Abd Portable 1v  04/10/2016  CLINICAL DATA:  Abdominal distension. EXAM: PORTABLE ABDOMEN - 1 VIEW COMPARISON:  CT, 04/07/2016 FINDINGS: There is increased gas in both the colon and small bowel with no significant bowel dilation. Some air is seen within the rectum. There are multiple surgical vascular clips along the abdominal midline. Vascular calcifications are noted along the aorta and iliac vessels. IMPRESSION: 1. No convincing bowel obstruction. 2. Increased gas noted  within the small bowel and colon suggesting a mild diffuse adynamic ileus. Electronically Signed   By: Lajean Manes M.D.   On: 04/10/2016 14:13    PHYSICAL EXAM  patient is oriented to person time and place. Lungs reveal decreased breath sounds and some scattered rhonchi. Cardiac exam reveals S1 and S2. There is no significant peripheral edema.   TELEMETRY: I have reviewed telemetry today April 24, 2016. This morning there is mild sinus tachycardia.  ASSESSMENT AND PLAN:    Atrial fibrillation with RVR ([facility])    The patient's pulmonary status was the main stimulation for his rapid atrial fib. He is on IV Cardizem that was restarted yesterday. He appears to be in sinus rhythm this morning. At the moment he is still nothing by mouth because of questions of swallowing difficulties. Plan to continue IV Cardizem today. We may be able to convert him to oral Cardizem when he is taking food and medicines by mouth.   Acute respiratory failure     This appears to be the patient's major issue at this time. Since his being managed aggressively.    CAD (coronary artery disease), native coronary artery      Coronary disease is stable. No further workup at this time.       HX: anticoagulation     The patient has been on Eliquis. At the moment he is nothing by mouth. If he remains nothing by mouth, he will need IV heparin. I'm hopeful that he will be allowed to take pills by mouth soon and resume his oral anticoagulation.   Dola Argyle 04/24/2016 7:53 AM

## 2016-04-24 NOTE — Evaluation (Signed)
Physical Therapy Evaluation Patient Details Name: Kenneth Merritt MRN: [id number] DOB: Sep 03, 1951 Today's Date: 04/24/2016   History of Present Illness  pt is a 65 y/o male wit PMHx of Afib, CAD, BPG, PVD, DJD and Prostate CA admitted with SOB, Afib with RVR, general weakness and diarrhea.  Clinical Impression   Pt admitted with/for weakness, SOB and Afib with RVR.  Significantly deconditioned at this time and suspect for recent aspiration.  Pt currently limited functionally due to the problems listed below.  (see problems list.)  Pt will benefit from PT to maximize function and safety to be able to get home safely with available assist of family.      Follow Up Recommendations Home health PT;Supervision/Assistance - 24 hour    Equipment Recommendations       Recommendations for Other Services       Precautions / Restrictions Precautions Precautions: Fall Precaution Comments: O2 dep (3 L at home) Restrictions Weight Bearing Restrictions: No      Mobility  Bed Mobility Overal bed mobility: Needs Assistance Bed Mobility: Supine to Sit     Supine to sit: Min guard     General bed mobility comments: for safety, lines  Transfers Overall transfer level: Needs assistance   Transfers: Sit to/from Stand Sit to Stand: Min assist         General transfer comment: slow and effortful from bed or armchair  Ambulation/Gait Ambulation/Gait assistance: Min assist Ambulation Distance (Feet): 70 Feet Assistive device: Rolling walker (2 wheeled) Gait Pattern/deviations: Step-through pattern;Decreased stride length;Trunk flexed Gait velocity: slower   General Gait Details: slow and unsteady gait  Stairs            Wheelchair Mobility    Modified Rankin (Stroke Patients Only)       Balance Overall balance assessment: Needs assistance Sitting-balance support: Bilateral upper extremity supported Sitting balance-Leahy Scale: Fair                                        Pertinent Vitals/Pain Pain Assessment: No/denies pain    Home Living Family/patient expects to be discharged to:: Private residence Living Arrangements: Spouse/significant other Available Help at Discharge: Family Type of Home: House Home Access: Stairs to enter Entrance Stairs-Rails: Right Entrance Stairs-Number of Steps: 3 Home Layout: Able to live on main level with bedroom/bathroom Home Equipment: Walker - 2 wheels;Cane - single point;Toilet riser      Prior Function Level of Independence: Independent               Hand Dominance        Extremity/Trunk Assessment   Upper Extremity Assessment: Defer to OT evaluation           Lower Extremity Assessment: Generalized weakness (3+/5 overall at best)      Cervical / Trunk Assessment: Kyphotic  Communication   Communication: No difficulties  Cognition Arousal/Alertness: Awake/alert Behavior During Therapy: WFL for tasks assessed/performed Overall Cognitive Status: Within Functional Limits for tasks assessed                      General Comments General comments (skin integrity, edema, etc.): EHR maintained in the low 90's, SpO2 in the lower 90's    Exercises        Assessment/Plan    PT Assessment Patient needs continued PT services  PT Diagnosis Difficulty walking;Generalized weakness   PT Problem  List Decreased strength;Decreased activity tolerance;Decreased balance;Decreased mobility  PT Treatment Interventions Gait training;Stair training;Functional mobility training;Therapeutic activities;Patient/family education   PT Goals (Current goals can be found in the Care Plan section) Acute Rehab PT Goals Patient Stated Goal: want to catch my breath, home PT Goal Formulation: With patient Time For Goal Achievement: 04/28/16 Potential to Achieve Goals: Good    Frequency Min 3X/week   Barriers to discharge        Co-evaluation               End of Session  Equipment Utilized During Treatment: Oxygen Activity Tolerance: Patient tolerated treatment well Patient left: in chair;with call bell/phone within reach;with chair alarm set Nurse Communication: Mobility status;Other (comment) (pt requesting breathing treatment)         Time: [id number] PT Time Calculation (min) (ACUTE ONLY): 33 min   Charges:   PT Evaluation $PT Eval Moderate Complexity: 1 Procedure PT Treatments $Gait Training: 8-22 mins   PT G Codes:        Anique Beckley, Tessie Fass 04/24/2016, 11:04 AM  04/24/2016  Donnella Sham, PT [phone]  (pager)

## 2016-04-24 NOTE — Progress Notes (Addendum)
Patient ID: Kenneth Merritt, male   DOB: 1951/03/26, 65 y.o.   MRN: [id number]    PROGRESS NOTE    Kenneth Merritt  [id number] DOB: February 17, 1951 DOA: 04/21/2016  PCP: Kenneth Burly, MD   Brief Narrative:  Pt is 65 y.o. male with known chronic atrial fibrillation on Eliquis, hyperlipidemia, peripheral vascular disease status post aorta femoral bypass, recent hospitalization from 04/06/16 - 04/15/2016 for sepsis secondary to diverticulitis, presented to [facility] ED with main concern of progressive weakness, fatigue, exertional dyspnea as well as some dyspnea at rest, mid area chest discomfort that occurs with coughing spells, poor oral intake.  In ED, pt noted to be in a-fib with RVR and HR up to 120's, SBP as low as 80's, blood work notable for WBC 12 K, Hg 9.9, K 3.3, troponins 0.07 --> 0.06 --> 0.06, pt admitted to [facility] unit by [facility]. Pt started on Cardizem drip.   Major events since admission: 6/13 - pt is a-fib, on cardizem drip, cardiology consulted  6/14 - pt more lethargic, still in A-fib, was supposed to be on cardizem drip but was ? Discontinued yesterday, ? Converted to NSR, PCCM consulted  6/15 - looks better this AM, MBS pending, pt NPO for until MBS done   Assessment & Plan:   Principal Problem:   Atrial fibrillation with RVR, CHADS2 score 4 (HCC) - NSR this AM, rate in 80's, currently on Cardizem drip - also on Metoprolol 50 mg PO BID  - continue Apixaban  - appreciate cardiology team following  - keep in SDU  Active Problems:   Acute metabolic encephalopathy - secondary to acute COPD and a-fib with RVR - now resolved - pt more alert this AM, NAD    Elevated troponins - flat pattern  - cardio following     Acute hypoxic respiratory failure, Acute exacerbation of COPD (chronic obstructive pulmonary disease), ? Aspiration - respiratory status improving - still on oxygen via [place] 2 L - contineu solumedrol 60 mg IV Q8 hours - continue BD's scheduled and as needed, empiric Unasyn day  #2 - CT chest with RUL nodule, ? Neoplastic nodule with additional new ill-defined nodular consolidation right lower lobe - PCCM assistance appreciated  - plan for MBS today     Peripheral vascular disease, unspecified (HCC)   CAD (coronary artery disease), native coronary artery    Hypokalemia and hypomagnesemia - K still low so will supplement  - Mg is WNL this MA - repeat BMP in AM    Hyponatremia - resolved - monitor     Chronic diastolic CHF, grade I - per last 2 D ECHO 03/2016 - on lasix at home but here on hold per cardiology team  - weight trend since admission: 62 kg --> 60 kg  - monitor daily weights, strict I/O    Anemia of acute illness - drop in Hg since admission but no signs of active bleeding - CBC In AM    Hyperglycemia - A1C 6.6 - keep on SSI for now     Protein calorie malnutrition, severe  - nutritionist consulted, assistance and recommendations appreciated   DVT prophylaxis: Apixaban Code Status: Full Family Communication: Patient at bedside, family called and left message to call me back Disposition Plan: keep in SDU  Consultants:   Cardiology   PCCM  Procedures:   Plan for MBS today   Antimicrobials:   Unasyn 6/14 -->  Subjective: More lethargic this AM  Objective: Filed Vitals:   04/24/16 0500  04/24/16 0600 04/24/16 0800 04/24/16 0825  BP: 90/62 88/61 108/66   Pulse: 78 76 86   Temp:   97.3 F (36.3 C)   TempSrc:   Oral   Resp: 12 12 15    Weight:      SpO2: 97% 98% 100% 99%    Intake/Output Summary (Last 24 hours) at 04/24/16 1036 Last data filed at 04/24/16 0849  Gross per 24 hour  Intake 447.67 ml  Output   1500 ml  Net -1052.33 ml   Filed Weights   04/22/16 0356 04/23/16 0500 04/24/16 0400  Weight: 62.5 kg (137 lb 12.6 oz) 62.7 kg (138 lb 3.7 oz) 60.5 kg (133 lb 6.1 oz)    Examination:  General exam: Appears more alert, NAD Respiratory system: Rales with some exp wheezing.  Cardiovascular system: RRR. No  rubs, gallops or clicks.  Gastrointestinal system: Abdomen is nondistended, soft and nontender.  Central nervous system: A&O x 3, moving all 4 extremities again gravity  Extremities: Symmetric 5 x 5 power. Skin: No rashes, lesions or ulcers  Data Reviewed: I have personally reviewed following labs and imaging studies  CBC:  Recent Labs Lab 04/21/16 1807 04/22/16 0212 04/23/16 0233  WBC 12.1* 8.8 6.7  NEUTROABS 9.7*  --   --   HGB 9.9* 9.1* 8.6*  HCT 30.8* 29.9* 28.1*  MCV 87.0 88.2 88.4  PLT 207 199 [id number]   Basic Metabolic Panel:  Recent Labs Lab 04/21/16 1807 04/22/16 0212 04/23/16 0233 04/23/16 0751 04/24/16 0253  NA 132* 134* 136  --   --   K 3.3* 3.1* 3.2*  --   --   CL 92* 94* 95*  --   --   CO2 29 33* 35*  --   --   GLUCOSE 137* 142* 114*  --   --   BUN 12 6 <5*  --   --   CREATININE 0.98 0.76 0.47*  --   --   CALCIUM 7.7* 7.8* 7.6*  --   --   MG 1.5*  --   --  1.6* 2.1   Cardiac Enzymes:  Recent Labs Lab 04/21/16 1807 04/21/16 2223 04/22/16 0212  TROPONINI 0.07* 0.06* 0.05*   CBG:  Recent Labs Lab 04/23/16 0844 04/23/16 1219 04/23/16 1637 04/23/16 2115 04/24/16 0851  GLUCAP 141* 189* 175* 147* 164*   Urine analysis:    Component Value Date/Time   COLORURINE AMBER* 04/06/2016 1942   APPEARANCEUR CLOUDY* 04/06/2016 1942   LABSPEC 1.021 04/06/2016 1942   PHURINE 5.0 04/06/2016 1942   GLUCOSEU 250* 04/06/2016 1942   HGBUR MODERATE* 04/06/2016 1942   BILIRUBINUR MODERATE* 04/06/2016 1942   KETONESUR 40* 04/06/2016 1942   PROTEINUR NEGATIVE 04/06/2016 1942   UROBILINOGEN 0.2 01/21/2010 1944   NITRITE NEGATIVE 04/06/2016 1942   LEUKOCYTESUR NEGATIVE 04/06/2016 1942   Recent Results (from the past 240 hour(s))  MRSA PCR Screening     Status: None   Collection Time: 04/21/16 11:00 PM  Result Value Ref Range Status   MRSA by PCR NEGATIVE NEGATIVE Final    Radiology Studies: Dg Chest Portable 1 View 04/21/2016  No change from the previous  exam.  Ct Chest Wo Contrast 04/22/2016 New irregular nodular density within the superior segment of the right upper lobe, measuring 1.7 x 0.8 x 0.8 cm, suspicious for neoplastic nodule. Consider PET-CT for further characterization. Recommend surgical consultation for further workup considerations. 2. Additional new ill-defined nodular consolidation more inferiorly within the right lower lobe, measuring approximately 1.8  x 0.7 x 2.1 cm. This may also represent neoplastic process but is less suspicious, perhaps atelectasis or confluent scarring. PET-CT may be helpful for this finding as well. 3. Emphysema, upper lobe predominant. 4. Coronary artery calcifications, particularly dense within the left anterior descending coronary artery. Recommend correlation with any possible associated cardiac symptoms. 5. Cholelithiasis without evidence of acute cholecystitis. 6. Chronic-appearing compression fracture deformities of the T6 through T8 vertebral bodies. T6 compression has slightly progressed compared to the previous exam of 03/27/2016. T7 and T8 compression deformities appear stable. These results will be called to the ordering clinician or representative by the Radiologist Assistant, and communication documented in the PACS or zVision Dashboard.  Dg Chest Port 1 View 04/23/2016  Nodular opacity in the periphery the right mid lung, better seen on CT 1 day prior. Areas of scarring, primarily in the upper lobes near the apices. Lungs hyperexpanded. No edema or consolidation. Stable cardiac silhouette.    Scheduled Meds: . ampicillin-sulbactam (UNASYN) IV  3 g Intravenous Q8H  . apixaban  5 mg Oral BID  . digoxin  0.125 mg Oral Daily  . feeding supplement  1 Container Oral BID PC  . feeding supplement (ENSURE ENLIVE)  237 mL Oral Q24H  . gabapentin  300 mg Oral TID  . insulin aspart  0-15 Units Subcutaneous TID WC  . ipratropium  0.5 mg Nebulization Q4H  . levalbuterol  1.25 mg Nebulization Q4H  .  methylPREDNISolone (SOLU-MEDROL) injection  60 mg Intravenous Q8H  . metoprolol  50 mg Oral BID  . multivitamin with minerals  1 tablet Oral Daily  . pantoprazole  40 mg Oral Daily  . simvastatin  40 mg Oral q1800   Continuous Infusions: . diltiazem (CARDIZEM) infusion 10 mg/hr (04/24/16 0030)     LOS: 3 days    Time spent: 20 minutes    Faye Ramsay, MD [facility] Pager [phone]  If 7PM-7AM, please contact night-coverage [email or web address] Password [facility] 04/24/2016, 10:36 AM

## 2016-04-24 NOTE — Progress Notes (Signed)
MBSS complete. Full report located under chart review in imaging section.  Of note, frequent coughing during study was not associated with aspiration. Please see full report for further details.   Germain Osgood, M.A. CCC-SLP [phone]

## 2016-04-24 NOTE — Clinical Social Work Note (Signed)
CSW received referral for SNF.  Case discussed with case manager, and plan is to discharge home with home health.  CSW to sign off please re-consult if social work needs arise.  Jedi Catalfamo R. Stevi Hollinshead, MSW, LCSWA [phone]  

## 2016-04-24 NOTE — Evaluation (Signed)
Occupational Therapy Evaluation Patient Details Name: Kenneth Merritt MRN: [id number] DOB: 1951-01-23 Today's Date: 04/24/2016    History of Present Illness pt is a 65 y/o male wit PMHx of Afib, CAD, BPG, PVD, DJD and Prostate CA admitted with SOB, Afib with RVR, general weakness and diarrhea.   Clinical Impression   Pt admitted with above. He demonstrates the below listed deficits and will benefit from continued OT to maximize safety and independence with BADLs.  Pt presents to OT with generalized weakness, impaired balance, poor activity tolerance.   He requires min A for ADLs and fatigues rapidly.  DOE3/4-4.4 with sats decreasing to low 80s with activity, but quickly rebounded to low 90s with rest .       Follow Up Recommendations  Home health OT;Supervision/Assistance - 24 hour    Equipment Recommendations  Tub/shower seat    Recommendations for Other Services       Precautions / Restrictions Precautions Precautions: Fall Precaution Comments: O2 dep      Mobility Bed Mobility Overal bed mobility: Needs Assistance Bed Mobility: Sit to Supine       Sit to supine: Min guard   General bed mobility comments: min guard for safety   Transfers Overall transfer level: Needs assistance Equipment used: Rolling walker (2 wheeled) Transfers: Sit to/from [facility] Sit to Stand: Min assist Stand pivot transfers: Min guard       General transfer comment: Pt moves slowly.  Requires assist to move into standing     Balance Overall balance assessment: Needs assistance Sitting-balance support: No upper extremity supported;Feet supported Sitting balance-Leahy Scale: Fair     Standing balance support: Bilateral upper extremity supported Standing balance-Leahy Scale: Poor Standing balance comment: reliant on UE support                             ADL Overall ADL's : Needs assistance/impaired Eating/Feeding: Independent;Sitting   Grooming:  Wash/dry hands;Wash/dry face;Oral care;Brushing hair;Set up;Sitting   Upper Body Bathing: Minimal assitance;Sitting   Lower Body Bathing: Minimal assistance;Sit to/from stand   Upper Body Dressing : Set up;Sitting   Lower Body Dressing: Minimal assistance;Sit to/from stand   Toilet Transfer: Minimal assistance;Stand-pivot;BSC;RW   Toileting- Clothing Manipulation and Hygiene: Minimal assistance;Sit to/from stand       Functional mobility during ADLs: Minimal assistance;Rolling walker General ADL Comments: Pt fatigues quickly with ADLs     Vision     Perception     Praxis      Pertinent Vitals/Pain Pain Assessment: No/denies pain     Hand Dominance Right   Extremity/Trunk Assessment Upper Extremity Assessment Upper Extremity Assessment: Generalized weakness   Lower Extremity Assessment Lower Extremity Assessment: Defer to PT evaluation   Cervical / Trunk Assessment Cervical / Trunk Assessment: Kyphotic   Communication Communication Communication: No difficulties   Cognition Arousal/Alertness: Awake/alert Behavior During Therapy: Flat affect Overall Cognitive Status: No family/caregiver present to determine baseline cognitive functioning                     General Comments       Exercises       Shoulder Instructions      Home Living Family/patient expects to be discharged to:: Private residence Living Arrangements: Spouse/significant other Available Help at Discharge: Family Type of Home: House Home Access: Stairs to enter Technical brewer of Steps: 3 Entrance Stairs-Rails: Right Home Layout: Able to live on main level with  bedroom/bathroom     Bathroom Shower/Tub: Tub/shower unit Shower/tub characteristics: Architectural technologist: Standard     Home Equipment: Environmental consultant - 2 wheels;Cane - single point;Toilet riser          Prior Functioning/Environment Level of Independence: Independent        Comments: Pt reports he used  supplemental 02 at home     OT Diagnosis: Generalized weakness   OT Problem List: Decreased strength;Decreased activity tolerance;Impaired balance (sitting and/or standing);Decreased safety awareness;Decreased knowledge of use of DME or AE;Cardiopulmonary status limiting activity   OT Treatment/Interventions: Self-care/ADL training;Therapeutic exercise;Energy conservation;DME and/or AE instruction;Therapeutic activities;Patient/family education;Balance training    OT Goals(Current goals can be found in the care plan section) Acute Rehab OT Goals Patient Stated Goal: to go home  OT Goal Formulation: With patient Time For Goal Achievement: 05/08/16 Potential to Achieve Goals: Good ADL Goals Pt Will Perform Grooming: with supervision;standing Pt Will Perform Upper Body Bathing: with set-up;sitting Pt Will Perform Lower Body Bathing: with supervision;sit to/from stand Pt Will Perform Upper Body Dressing: with set-up;sitting Pt Will Perform Lower Body Dressing: with supervision;sit to/from stand Pt Will Transfer to Toilet: with supervision;ambulating;regular height toilet;grab bars Pt Will Perform Toileting - Clothing Manipulation and hygiene: with supervision;sit to/from stand Additional ADL Goal #1: Pt will independently incorporated energy conservation activities into ADL taks with min cues   OT Frequency: Min 2X/week   Barriers to D/C:            Co-evaluation              End of Session Equipment Utilized During Treatment: Rolling walker;Oxygen Nurse Communication: Mobility status  Activity Tolerance: Patient limited by fatigue Patient left: in bed;with call bell/phone within reach;with bed alarm set   Time: 1417-1434 OT Time Calculation (min): 17 min Charges:  OT General Charges $OT Visit: 1 Procedure OT Evaluation $OT Eval Moderate Complexity: 1 Procedure G-Codes:    Chrisanna Mishra M 2016/05/01, 3:15 PM

## 2016-04-24 NOTE — Care Management Important Message (Signed)
Important Message  Patient Details  Name: NASYR ENCARNACION MRN: [id number] Date of Birth: September 14, 1951   Medicare Important Message Given:  Yes    Loann Quill 04/24/2016, 9:32 AM

## 2016-04-24 NOTE — Progress Notes (Signed)
PULMONARY / CRITICAL CARE MEDICINE   Name: Kenneth Merritt MRN: [id number] DOB: [birth year]    ADMISSION DATE:  04/21/2016 CONSULTATION DATE:  6/14  REFERRING MD:  Doyle Askew ([facility])   CHIEF COMPLAINT:  Respiratory failure, AFib with RVR   HISTORY OF PRESENT ILLNESS:   65yo male smoker with hx AFib on eliquis, PVD, COPD on home O2, chronic pain, recent admission 5/28-6/6 for sepsis r/t diverticulitis.  Returned 6/12 with weakness, SOB, cough.  In ER was noted to be in AFib with RVR and mild hypotension.  He was admitted to SDU by [facility] and started on cardizem gtt.  Cardizem gtt was stoped 6/13 r/t conversion back to NSR.  On 6/14 pt had progressive lethargy and ongoing Afib with RVR.  Cardizem gtt resumed and PCCM consulted.       SUBJECTIVE: Feeling better today, but still somewhat SOB. Biggest complaint at this point is dry mouth.    VITAL SIGNS: BP 108/66 mmHg  Pulse 86  Temp(Src) 97.3 F (36.3 C) (Oral)  Resp 15  Wt 60.5 kg (133 lb 6.1 oz)  SpO2 99%  HEMODYNAMICS:    VENTILATOR SETTINGS:    INTAKE / OUTPUT: I/O last 3 completed shifts: In: 483.7 [I.V.:233.7; IV Piggyback:250] Out: 2050 [Urine:2050]  PHYSICAL EXAMINATION: General:  Chronically ill appearing male in NAD Neuro:  Spontaneously awake, alert, oriented.   HEENT:  Mm moist, no JVD  Cardiovascular:  RRR, rate controlled.  Lungs:  resps even, unlabored. Upper airway secretions he is having difficulty clearing. Mild wheeze Abdomen:  Round, soft, non tender, +bs  Musculoskeletal:  Warm and dry, no edema   LABS:  BMET  Recent Labs Lab 04/21/16 1807 04/22/16 0212 04/23/16 0233  NA 132* 134* 136  K 3.3* 3.1* 3.2*  CL 92* 94* 95*  CO2 29 33* 35*  BUN 12 6 <5*  CREATININE 0.98 0.76 0.47*  GLUCOSE 137* 142* 114*    Electrolytes  Recent Labs Lab 04/21/16 1807 04/22/16 0212 04/23/16 0233 04/23/16 0751 04/24/16 0253  CALCIUM 7.7* 7.8* 7.6*  --   --   MG 1.5*  --   --  1.6* 2.1    CBC  Recent  Labs Lab 04/21/16 1807 04/22/16 0212 04/23/16 0233  WBC 12.1* 8.8 6.7  HGB 9.9* 9.1* 8.6*  HCT 30.8* 29.9* 28.1*  PLT 207 199 199    Coag's No results for input(s): APTT, INR in the last 168 hours.  Sepsis Markers No results for input(s): LATICACIDVEN, PROCALCITON, O2SATVEN in the last 168 hours.  ABG  Recent Labs Lab 04/23/16 0907  PHART 7.446  PCO2ART 50.2*  PO2ART 83.0    Liver Enzymes No results for input(s): AST, ALT, ALKPHOS, BILITOT, ALBUMIN in the last 168 hours.  Cardiac Enzymes  Recent Labs Lab 04/21/16 1807 04/21/16 2223 04/22/16 0212  TROPONINI 0.07* 0.06* 0.05*    Glucose  Recent Labs Lab 04/22/16 2127 04/23/16 0844 04/23/16 1219 04/23/16 1637 04/23/16 2115 04/24/16 0851  GLUCAP 145* 141* 189* 175* 147* 164*    Imaging Dg Chest Portable 1 View  04/24/2016  CLINICAL DATA:  Respiratory failure.  Shortness of breath. EXAM: PORTABLE CHEST 1 VIEW COMPARISON:  04/23/2016.  CT 04/22/2016. FINDINGS: Mediastinum hilar structures normal. Heart size normal. Previously identified nodular density in the right mid lung is again best identified by CT. No pleural effusion or pneumothorax. No acute bony abnormality. IMPRESSION: 1. No acute cardiopulmonary disease. 2. Previously identified small right mid lung nodular density is again best identified by  prior CT of 04/22/2016. Reference is made to that report. Electronically Signed   By: Marcello Moores  Register   On: 04/24/2016 07:37     STUDIES:  CT chest 6/13>>> New irregular nodular density within the superior segment of the right upper lobe, measuring 1.7 x 0.8 x 0.8 cm, suspicious for neoplastic nodule.  2. Additional new ill-defined nodular consolidation more inferiorly within the right lower lobe, measuring approximately 1.8 x 0.7 x 2.1 cm. This may also represent neoplastic process but is less suspicious, perhaps atelectasis or confluent scarring.  Emphysema, upper lobe predominant. Barium swallow  6/15  CULTURES:   ANTIBIOTICS: Unasyn 6/14>  SIGNIFICANT EVENTS:   LINES/TUBES:   DISCUSSION: 65yo male with hx COPD, recent sepsis r/t diverticulitis now admitted with acute respiratory failure and progressive lethargy in setting persistent AFib with RVR.    ASSESSMENT / PLAN:   Acute respiratory failure - multifactorial in setting persistent AFib with RVR and probable AECOPD. Also with concern for aspiration. -Supplemental O2 with goal sat 90-94%  -Scheduled - xopenex, atrovent  -IV Solumedrol 60mg  q8h > maintain dose as active bronchospasm -Empiric Unasyn -F/u CXR  -Swallow eval pending today, will OK for ice chips.  RUL, RLL nodules - new from CT 5/18 suggest this is likely reactive in nature, however, always concern for malignancy  - Outpatient pulmonary follow up for repeat imaging/PET - Would likely require navigational bronchoscopy under general anesthesia for biopsy  AFib with RVR - difficult to control.  CHADS2 score 4 CAD -Cardiology following -cardizem gtt  -digoxin - holding PO anticoag while NPO  Hypokalemia  Hyponatremia  HypoMg -Per primary  DM -per primary   FAMILY  - Updates: family updated at bedside by [facility] 6/15  - Inter-disciplinary family meet or Palliative Care meeting due by:  6/21   Georgann Housekeeper, AGACNP-BC [place] [facility]/Critical [facility] Pager [phone] or [phone]  04/24/2016 10:23 AM   More alert.  Still has cough with chest congestion.  Scattered rhonchi.  Hb 8.6, K 3.2, Creatinine 0.47  Assessment/plan:  Acute respiratory failure, hypoxia/hypercapnia. AECOPD. Aspiration. - continue BDs - prn BiPAP - continue solumedrol - bronchial hygiene - unasyn  Lung nodules. - f/u as outpt  A fib, CAD. - per primary team and cardiology   Chesley Mires, MD [place] 04/24/2016, 1:28 PM Pager:  [phone] After 3pm call: [phone]

## 2016-04-25 ENCOUNTER — Inpatient Hospital Stay (HOSPITAL_COMMUNITY): Payer: Medicare Other

## 2016-04-25 DIAGNOSIS — J9611 Chronic respiratory failure with hypoxia: Secondary | ICD-10-CM

## 2016-04-25 LAB — CBC
HCT: 26.7 % — ABNORMAL LOW (ref 39.0–52.0)
Hemoglobin: 8 g/dL — ABNORMAL LOW (ref 13.0–17.0)
MCH: 26.8 pg (ref 26.0–34.0)
MCHC: 30 g/dL (ref 30.0–36.0)
MCV: 89.6 fL (ref 78.0–100.0)
PLATELETS: 213 10*3/uL (ref 150–400)
RBC: 2.98 MIL/uL — ABNORMAL LOW (ref 4.22–5.81)
RDW: 15.4 % (ref 11.5–15.5)
WBC: 9.3 10*3/uL (ref 4.0–10.5)

## 2016-04-25 LAB — GLUCOSE, CAPILLARY
GLUCOSE-CAPILLARY: 175 mg/dL — AB (ref 65–99)
GLUCOSE-CAPILLARY: 159 mg/dL — AB (ref 65–99)
Glucose-Capillary: 176 mg/dL — ABNORMAL HIGH (ref 65–99)
Glucose-Capillary: 216 mg/dL — ABNORMAL HIGH (ref 65–99)

## 2016-04-25 LAB — BASIC METABOLIC PANEL
ANION GAP: 8 (ref 5–15)
BUN: <5 mg/dL — ABNORMAL LOW (ref 6–20)
CALCIUM: 8 mg/dL — AB (ref 8.9–10.3)
CO2: 32 mmol/L (ref 22–32)
CREATININE: 0.46 mg/dL — AB (ref 0.61–1.24)
Chloride: 101 mmol/L (ref 101–111)
Glucose, Bld: 164 mg/dL — ABNORMAL HIGH (ref 65–99)
Potassium: 4.1 mmol/L (ref 3.5–5.1)
SODIUM: 141 mmol/L (ref 135–145)

## 2016-04-25 MED ORDER — BISACODYL 10 MG RE SUPP
10.0000 mg | Freq: Every day | RECTAL | Status: DC | PRN
Start: 2016-04-25 — End: 2016-05-01
  Administered 2016-04-25: 10 mg via RECTAL
  Filled 2016-04-25: qty 1

## 2016-04-25 MED ORDER — DILTIAZEM HCL ER COATED BEADS 120 MG PO CP24
120.0000 mg | ORAL_CAPSULE | Freq: Every day | ORAL | Status: DC
  Administered 2016-04-25 – 2016-04-28 (×4): 120 mg via ORAL
  Filled 2016-04-25 (×4): qty 1

## 2016-04-25 MED ORDER — POLYETHYLENE GLYCOL 3350 17 G PO PACK
17.0000 g | PACK | Freq: Every day | ORAL | Status: DC
  Administered 2016-04-27 – 2016-05-01 (×2): 17 g via ORAL
  Filled 2016-04-25 (×4): qty 1

## 2016-04-25 MED ORDER — AMOXICILLIN-POT CLAVULANATE 875-125 MG PO TABS
1.0000 | ORAL_TABLET | Freq: Two times a day (BID) | ORAL | Status: DC
  Administered 2016-04-25 – 2016-05-01 (×13): 1 via ORAL
  Filled 2016-04-25 (×14): qty 1

## 2016-04-25 MED ORDER — ENSURE ENLIVE PO LIQD
237.0000 mL | Freq: Two times a day (BID) | ORAL | Status: DC
  Administered 2016-04-26 – 2016-04-27 (×4): 237 mL via ORAL

## 2016-04-25 MED ORDER — ATORVASTATIN CALCIUM 20 MG PO TABS
20.0000 mg | ORAL_TABLET | Freq: Every day | ORAL | Status: DC
Start: 2016-04-25 — End: 2016-05-01
  Administered 2016-04-25 – 2016-04-30 (×6): 20 mg via ORAL
  Filled 2016-04-25 (×6): qty 1

## 2016-04-25 MED ORDER — METHYLPREDNISOLONE SODIUM SUCC 40 MG IJ SOLR
40.0000 mg | Freq: Two times a day (BID) | INTRAMUSCULAR | Status: AC
  Administered 2016-04-25 – 2016-04-27 (×5): 40 mg via INTRAVENOUS
  Filled 2016-04-25 (×5): qty 1

## 2016-04-25 NOTE — Care Management Important Message (Signed)
Important Message  Patient Details  Name: Kenneth Merritt MRN: [id number] Date of Birth: 05/09/1951   Medicare Important Message Given:  Yes    Loann Quill 04/25/2016, 9:19 AM

## 2016-04-25 NOTE — Progress Notes (Signed)
PULMONARY / CRITICAL CARE MEDICINE   Name: Kenneth Merritt MRN: [id number] DOB: May 13, 1951    ADMISSION DATE:  04/21/2016 CONSULTATION DATE:  6/14  REFERRING MD:  Doyle Askew ([facility])   CHIEF COMPLAINT:  Respiratory failure, AFib with RVR   HISTORY OF PRESENT ILLNESS:   65yo male smoker with hx AFib on eliquis, PVD, COPD on home O2, chronic pain, recent admission 5/28-6/6 for sepsis r/t diverticulitis.  Returned 6/12 with weakness, SOB, cough.  In ER was noted to be in AFib with RVR and mild hypotension.  He was admitted to SDU by [facility] and started on cardizem gtt.  Cardizem gtt was stoped 6/13 r/t conversion back to NSR.  On 6/14 pt had progressive lethargy and ongoing Afib with RVR.  Cardizem gtt resumed and PCCM consulted.       SUBJECTIVE:  Feels better  VITAL SIGNS: BP 94/62 mmHg  Pulse 76  Temp(Src) 97.4 F (36.3 C) (Oral)  Resp 15  Ht 5\' 4"  (1.626 m)  Wt 136 lb 14.5 oz (62.1 kg)  BMI 23.49 kg/m2  SpO2 100% 4 liters  HEMODYNAMICS:    VENTILATOR SETTINGS:    INTAKE / OUTPUT: I/O last 3 completed shifts: In: [id number] [P.O.:150; I.V.:225; IV Piggyback:300] Out: 1400 [Urine:1400]  PHYSICAL EXAMINATION: General:  Chronically ill appearing male in NAD Neuro:  Spontaneously awake, alert, oriented.   HEENT:  Mm moist, no JVD  Cardiovascular:  RRR, rate controlled.  Lungs:  resps even, unlabored. Still w/ scattered course rhonchi  Abdomen:  Round, soft, non tender, +bs  Musculoskeletal:  Warm and dry, no edema   LABS:  BMET  Recent Labs Lab 04/22/16 0212 04/23/16 0233 04/25/16 0300  NA 134* 136 141  K 3.1* 3.2* 4.1  CL 94* 95* 101  CO2 33* 35* 32  BUN 6 <5* <5*  CREATININE 0.76 0.47* 0.46*  GLUCOSE 142* 114* 164*    Electrolytes  Recent Labs Lab 04/21/16 1807 04/22/16 0212 04/23/16 0233 04/23/16 0751 04/24/16 0253 04/25/16 0300  CALCIUM 7.7* 7.8* 7.6*  --   --  8.0*  MG 1.5*  --   --  1.6* 2.1  --     CBC  Recent Labs Lab 04/22/16 0212  04/23/16 0233 04/25/16 0300  WBC 8.8 6.7 9.3  HGB 9.1* 8.6* 8.0*  HCT 29.9* 28.1* 26.7*  PLT 199 199 213    Coag's No results for input(s): APTT, INR in the last 168 hours.  Sepsis Markers No results for input(s): LATICACIDVEN, PROCALCITON, O2SATVEN in the last 168 hours.  ABG  Recent Labs Lab 04/23/16 0907  PHART 7.446  PCO2ART 50.2*  PO2ART 83.0    Liver Enzymes No results for input(s): AST, ALT, ALKPHOS, BILITOT, ALBUMIN in the last 168 hours.  Cardiac Enzymes  Recent Labs Lab 04/21/16 1807 04/21/16 2223 04/22/16 0212  TROPONINI 0.07* 0.06* 0.05*    Glucose  Recent Labs Lab 04/23/16 2115 04/24/16 0851 04/24/16 1411 04/24/16 1644 04/24/16 2154 04/25/16 0712  GLUCAP 147* 164* 237* 163* 137* 175*    Imaging Dg Swallowing Func-speech Pathology  04/24/2016  Objective Swallowing Evaluation: Type of Study: MBS-Modified Barium Swallow Study Patient Details Name: Kenneth Merritt MRN: [id number] Date of Birth: 04-Sep-1951 Today's Date: 04/24/2016 Time: SLP Start Time (ACUTE ONLY): 1149-SLP Stop Time (ACUTE ONLY): 1209 SLP Time Calculation (min) (ACUTE ONLY): 20 min Past Medical History: Past Medical History Diagnosis Date . GERD (gastroesophageal reflux disease)  . COPD (chronic obstructive pulmonary disease) (HCC)    severe by PFTs  August 2010 . 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 Atrial fibrillation (HCC)    post-op a-fib. 3/11. post Aortobifem , /  Atrial fibrillation from nebulizer treatment  May, 2012, while hospitalized . Renal artery stenosis ([place])     presumably corrected at time of aortobifem surgery March, 2011 . Tobacco user     stop smoking . Pneumothorax     2011  before aortobifemoral surgery,  resolved . Peripheral vascular disease, unspecified ([place])    bilateral intermittent claudication . CAD (coronary artery disease), native coronary artery    catheterization  06/2009.Marland KitchenMarland Kitchen70% proximal LAD and 30% proximal RCA. normal LV function, no intervention planned prior to surgery for aorta. LV normal function  by catheter 8/10.  [name] Dyslipidemia  . DJD (degenerative joint disease)  . Ejection fraction    60%, echo, May, 2012, rapid atrial fib at the time . Cancer [facility]) 2013   Prostate Radiation . Myocardial infarction ([place]) 2011 . On home O2    2L N/C . Chronic thoracic back pain  . Chronic pain syndrome  Past Surgical History: Past Surgical History Procedure Laterality Date . Abdominal aortic aneurysm repair  01/11/2010 . Pr vein bypass graft,aorto-fem-pop   . Inguinal hernia repair     RIGHT HPI: Patient with a long history of respiratory issues and reports new onset of difficulty swallowing Subjective: pt says he has trouble swallowing his pills Assessment / Plan / Recommendation CHL IP CLINICAL IMPRESSIONS 04/24/2016 Therapy Diagnosis Mild oral phase dysphagia;Mild pharyngeal phase dysphagia Clinical Impression Pt has a mild oropharyngeal dysphagia with no aspiration observed during this study. He has brief oral holding with thin liquids and prolonged mastication of solids, likely due to lack of dentition. He does have difficulty posteriorly transiting a halved barium tablet (broken at his request), which leads to premature spillage of thin liquid that is subsequently penetrated. This small amount of penetration occurs silently, but is cleared with a cued throat clear. Recommend Dys 2 diet and thin liquids, with no mixed consistencies.  Impact on safety and function Mild aspiration risk   CHL IP TREATMENT RECOMMENDATION 04/24/2016 Treatment Recommendations Therapy as outlined in treatment plan below   Prognosis 04/24/2016 Prognosis for Safe Diet Advancement Fair Barriers to Reach Goals -- Barriers/Prognosis Comment -- CHL IP DIET RECOMMENDATION 04/24/2016 SLP Diet Recommendations Dysphagia 2 (Fine chop) solids;Thin liquid Liquid Administration via Cup;No straw Medication Administration  Whole meds with puree Compensations Slow rate;Small sips/bites;Clear throat intermittently Postural Changes Remain semi-upright after after feeds/meals (Comment);Seated upright at 90 degrees   CHL IP OTHER RECOMMENDATIONS 04/24/2016 Recommended Consults -- Oral Care Recommendations Oral care BID Other Recommendations --   CHL IP FOLLOW UP RECOMMENDATIONS 04/24/2016 Follow up Recommendations (No Data)   CHL IP FREQUENCY AND DURATION 04/24/2016 Speech Therapy Frequency (ACUTE ONLY) min 2x/week Treatment Duration 2 weeks      CHL IP ORAL PHASE 04/24/2016 Oral Phase Impaired Oral - Pudding Teaspoon -- Oral - Pudding Cup -- Oral - Honey Teaspoon -- Oral - Honey Cup -- Oral - Nectar Teaspoon -- Oral - Nectar Cup -- Oral - Nectar Straw -- Oral - Thin Teaspoon -- Oral - Thin Cup Holding of bolus Oral - Thin Straw Holding of bolus;Premature spillage Oral - Puree WFL Oral - Mech Soft Impaired mastication Oral - Regular -- Oral - Multi-Consistency -- Oral - Pill Delayed oral transit Oral Phase - Comment --  CHL IP PHARYNGEAL PHASE 04/24/2016 Pharyngeal Phase Impaired Pharyngeal- Pudding Teaspoon -- Pharyngeal -- Pharyngeal- Pudding Cup -- Pharyngeal -- Pharyngeal- Honey Teaspoon -- Pharyngeal -- Pharyngeal- Honey Cup -- Pharyngeal -- Pharyngeal- Nectar Teaspoon -- Pharyngeal -- Pharyngeal- Nectar Cup -- Pharyngeal -- Pharyngeal- Nectar Straw -- Pharyngeal -- Pharyngeal- Thin Teaspoon -- Pharyngeal -- Pharyngeal- Thin Cup WFL Pharyngeal -- Pharyngeal- Thin Straw Penetration/Aspiration before swallow Pharyngeal Material enters airway, remains ABOVE vocal cords and not ejected out Pharyngeal- Puree WFL Pharyngeal -- Pharyngeal- Mechanical Soft WFL Pharyngeal -- Pharyngeal- Regular -- Pharyngeal -- Pharyngeal- Multi-consistency -- Pharyngeal -- Pharyngeal- Pill WFL Pharyngeal -- Pharyngeal Comment --  CHL IP CERVICAL ESOPHAGEAL PHASE 04/24/2016 Cervical Esophageal Phase WFL Pudding Teaspoon -- Pudding Cup -- Honey Teaspoon -- Honey Cup  -- Nectar Teaspoon -- Nectar Cup -- Nectar Straw -- Thin Teaspoon -- Thin Cup -- Thin Straw -- Puree -- Mechanical Soft -- Regular -- Multi-consistency -- Pill -- Cervical Esophageal Comment -- No flowsheet data found. Germain Osgood, M.A. CCC-SLP [phone] Germain Osgood 04/24/2016, 2:07 PM                STUDIES:  CT chest 6/13>>> New irregular nodular density within the superior segment of the right upper lobe, measuring 1.7 x 0.8 x 0.8 cm, suspicious for neoplastic nodule.  2. Additional new ill-defined nodular consolidation more inferiorly within the right lower lobe, measuring approximately 1.8 x 0.7 x 2.1 cm. This may also represent neoplastic process but is less suspicious, perhaps atelectasis or confluent scarring.  Emphysema, upper lobe predominant. Barium swallow 6/15  CULTURES:   ANTIBIOTICS: Unasyn 6/14>  SIGNIFICANT EVENTS:   LINES/TUBES:   DISCUSSION: 65yo male with hx COPD, recent sepsis r/t diverticulitis now admitted with acute respiratory failure and progressive lethargy in setting persistent AFib with RVR.  Now better. Not sure how much the AF was the result of AECOPD OR was the AF the cause of the respiratory failure. Now in NSR and feels close to baseline. Would change abx to oral; complete 5-7d total, slow taper pred over 10d and we will set him up for out-pt f/u re: nodule.   ASSESSMENT / PLAN:   Acute respiratory failure - multifactorial in setting persistent AFib with RVR and probable AECOPD. Clinically better. Swallow study was negative for aspiration -Supplemental O2 with goal sat 90-94%  -Scheduled - xopenex, atrovent  -IV Solumedrol 60mg -->change to 40mg  q12 -Empiric Unasyn-->change to augmentin  -F/u CXR   RUL, RLL nodules - new from CT 5/18 suggest this is likely reactive in nature, however, always concern for malignancy  - Outpatient pulmonary follow up for repeat imaging 2-3 weeks and f/u w/ Luan Pulling - Would likely require navigational  bronchoscopy under general anesthesia for biopsy if repeat CT scan still w/ nodular changes.   AFib with RVR - difficult to control.  CHADS2 score 4-->now NSR and off CCB gtt  CAD -Cardiology following - resumed Eliquis  - dig stopped, started on low dose LA ccb  Hypokalemia  Hyponatremia  HypoMg -Per primary  DM -per primary   FAMILY  - Updates: family updated at bedside by [facility] 6/15  - Inter-disciplinary family meet or Palliative Care meeting due by:  6/21    Erick Colace ACNP-BC [place] Pager # [phone] OR # [phone] if no answer   Breathing better.  Denies chest pain.  No wheeze, HR regular.  Assessment/plan:  AECOPD  - BDs - wean off steroids as tolerated  Aspiration PNA. - Abx per  primary team  RUL and RLL nodules. - f/u CT chest w/o contrast in 6 weeks >> if nodules persist, then he will need further assessment  PCCM will sign off.  Chesley Mires, MD [facility] Pulmonary/Critical Care 04/25/2016, 11:00 AM Pager:  [phone] After 3pm call: [phone]

## 2016-04-25 NOTE — Progress Notes (Addendum)
Patient ID: Kenneth Merritt, male   DOB: 18-Aug-1951, 65 y.o.   MRN: [id number]    PROGRESS NOTE    Kenneth Merritt  [id number] DOB: 1951/04/25 DOA: 04/21/2016  PCP: Neale Burly, MD   Brief Narrative:  Pt is 65 y.o. male with known chronic atrial fibrillation on Eliquis, hyperlipidemia, peripheral vascular disease status post aorta femoral bypass, recent hospitalization from 04/06/16 - 04/15/2016 for sepsis secondary to diverticulitis, presented to [facility] ED with main concern of progressive weakness, fatigue, exertional dyspnea as well as some dyspnea at rest, mid area chest discomfort that occurs with coughing spells, poor oral intake.  In ED, pt noted to be in a-fib with RVR and HR up to 120's, SBP as low as 80's, blood work notable for WBC 12 K, Hg 9.9, K 3.3, troponins 0.07 --> 0.06 --> 0.06, pt admitted to [facility] unit by [facility]. Pt started on Cardizem drip.   Major events since admission: 6/13 - pt is a-fib, on cardizem drip, cardiology consulted  6/14 - pt more lethargic, still in A-fib, was supposed to be on cardizem drip but was ? Discontinued yesterday, ? Converted to NSR, PCCM consulted  6/16 - started on long-acting diltiazem by cardiology  Assessment & Plan:   Principal Problem:   Atrial fibrillation with RVR, CHADS2 score 4 (HCC) - NSR this AM, rate in 80's, currently on Cardizem drip - also on Metoprolol 50 mg PO BID  - continue Apixaban  - appreciate cardiology team following  - keep in SDU for now - starting long-acting diltiazem.   Active Problems:   Acute metabolic encephalopathy - secondary to acute COPD and a-fib with RVR - now resolved    Elevated troponins - flat pattern  - cardio following     Acute hypoxic respiratory failure, Acute exacerbation of COPD (chronic obstructive pulmonary disease), ? Aspiration - respiratory status improving - still on oxygen via [place] 2 L - continue solumedrol --> weaning down - continue BD's scheduled and as needed, empiric Unasyn day #3,  continue for now. CXR today.  - CT chest with RUL nodule, ? Neoplastic nodule with additional new ill-defined nodular consolidation right lower lobe - PCCM assistance appreciated  - MBS results pending     Peripheral vascular disease, unspecified ([place])   CAD (coronary artery disease), native coronary artery    Hypokalemia and hypomagnesemia - K still low so will supplement  - Mg is WNL this MA - repeat BMP in AM    Hyponatremia - resolved - monitor     Chronic diastolic CHF, grade I - per last 2 D ECHO 03/2016 - on lasix at home but here on hold per cardiology team  - weight trend since admission: 62 kg --> 60 kg  - monitor daily weights, strict I/O    Anemia of acute illness - drop in Hg since admission but no signs of active bleeding - CBC In AM    Hyperglycemia - A1C 6.6 - keep on SSI for now     Protein calorie malnutrition, severe  - nutritionist consulted, assistance and recommendations appreciated   DVT prophylaxis: Apixaban Code Status: Full Family Communication: Patient at bedside, family called and left message to call me back Disposition Plan: keep in SDU  Consultants:   Cardiology   PCCM  Procedures:   Plan for MBS today   Antimicrobials:   Unasyn 6/14 -->  Subjective: No acute changes.    Objective: Filed Vitals:   04/25/16 0400 04/25/16 0600 04/25/16  0715 04/25/16 0907  BP: 98/67 100/78 94/62   Pulse: 75 80 80 76  Temp: 97.2 F (36.2 C)  97.4 F (36.3 C)   TempSrc: Axillary  Oral   Resp: 10  17 15   Height:      Weight: 136 lb 14.5 oz (62.1 kg)     SpO2: 98% 99% 98% 100%    Intake/Output Summary (Last 24 hours) at 04/25/16 0908 Last data filed at 04/25/16 0545  Gross per 24 hour  Intake    565 ml  Output    600 ml  Net    -35 ml   Filed Weights   04/23/16 0500 04/24/16 0400 04/25/16 0400  Weight: 138 lb 3.7 oz (62.7 kg) 133 lb 6.1 oz (60.5 kg) 136 lb 14.5 oz (62.1 kg)    Examination:  General exam: Appears more alert,  NAD Respiratory system: Rales with some exp wheezing.  Cardiovascular system: RRR. No rubs, gallops or clicks.  Gastrointestinal system: Abdomen is nondistended, soft and nontender.  Central nervous system: A&O x 3, moving all 4 extremities again gravity  Extremities: Symmetric 5 x 5 power. Skin: No rashes, lesions or ulcers  Data Reviewed: I have personally reviewed following labs and imaging studies  CBC:  Recent Labs Lab 04/21/16 1807 04/22/16 0212 04/23/16 0233 04/25/16 0300  WBC 12.1* 8.8 6.7 9.3  NEUTROABS 9.7*  --   --   --   HGB 9.9* 9.1* 8.6* 8.0*  HCT 30.8* 29.9* 28.1* 26.7*  MCV 87.0 88.2 88.4 89.6  PLT 207 199 199 [id number]   Basic Metabolic Panel:  Recent Labs Lab 04/21/16 1807 04/22/16 0212 04/23/16 0233 04/23/16 0751 04/24/16 0253 04/25/16 0300  NA 132* 134* 136  --   --  141  K 3.3* 3.1* 3.2*  --   --  4.1  CL 92* 94* 95*  --   --  101  CO2 29 33* 35*  --   --  32  GLUCOSE 137* 142* 114*  --   --  164*  BUN 12 6 <5*  --   --  <5*  CREATININE 0.98 0.76 0.47*  --   --  0.46*  CALCIUM 7.7* 7.8* 7.6*  --   --  8.0*  MG 1.5*  --   --  1.6* 2.1  --    Cardiac Enzymes:  Recent Labs Lab 04/21/16 1807 04/21/16 2223 04/22/16 0212  TROPONINI 0.07* 0.06* 0.05*   CBG:  Recent Labs Lab 04/24/16 0851 04/24/16 1411 04/24/16 1644 04/24/16 2154 04/25/16 0712  GLUCAP 164* 237* 163* 137* 175*   Urine analysis:    Component Value Date/Time   COLORURINE AMBER* 04/06/2016 1942   APPEARANCEUR CLOUDY* 04/06/2016 1942   LABSPEC 1.021 04/06/2016 1942   PHURINE 5.0 04/06/2016 1942   GLUCOSEU 250* 04/06/2016 1942   HGBUR MODERATE* 04/06/2016 1942   BILIRUBINUR MODERATE* 04/06/2016 1942   KETONESUR 40* 04/06/2016 1942   PROTEINUR NEGATIVE 04/06/2016 1942   UROBILINOGEN 0.2 01/21/2010 1944   NITRITE NEGATIVE 04/06/2016 1942   LEUKOCYTESUR NEGATIVE 04/06/2016 1942   Recent Results (from the past 240 hour(s))  MRSA PCR Screening     Status: None    Collection Time: 04/21/16 11:00 PM  Result Value Ref Range Status   MRSA by PCR NEGATIVE NEGATIVE Final    Radiology Studies: Dg Chest Portable 1 View 04/21/2016  No change from the previous exam.  Ct Chest Wo Contrast 04/22/2016 New irregular nodular density within the superior segment of the right  upper lobe, measuring 1.7 x 0.8 x 0.8 cm, suspicious for neoplastic nodule. Consider PET-CT for further characterization. Recommend surgical consultation for further workup considerations. 2. Additional new ill-defined nodular consolidation more inferiorly within the right lower lobe, measuring approximately 1.8 x 0.7 x 2.1 cm. This may also represent neoplastic process but is less suspicious, perhaps atelectasis or confluent scarring. PET-CT may be helpful for this finding as well. 3. Emphysema, upper lobe predominant. 4. Coronary artery calcifications, particularly dense within the left anterior descending coronary artery. Recommend correlation with any possible associated cardiac symptoms. 5. Cholelithiasis without evidence of acute cholecystitis. 6. Chronic-appearing compression fracture deformities of the T6 through T8 vertebral bodies. T6 compression has slightly progressed compared to the previous exam of 03/27/2016. T7 and T8 compression deformities appear stable. These results will be called to the ordering clinician or representative by the Radiologist Assistant, and communication documented in the PACS or zVision Dashboard.  Dg Chest Port 1 View 04/23/2016  Nodular opacity in the periphery the right mid lung, better seen on CT 1 day prior. Areas of scarring, primarily in the upper lobes near the apices. Lungs hyperexpanded. No edema or consolidation. Stable cardiac silhouette.    Scheduled Meds: . ampicillin-sulbactam (UNASYN) IV  3 g Intravenous Q8H  . apixaban  5 mg Oral BID  . diltiazem  120 mg Oral Daily  . feeding supplement  1 Container Oral BID PC  . feeding supplement (ENSURE ENLIVE)   237 mL Oral Q24H  . gabapentin  300 mg Oral TID  . insulin aspart  0-15 Units Subcutaneous TID WC  . ipratropium  0.5 mg Nebulization Q6H WA  . levalbuterol  1.25 mg Nebulization Q6H WA  . methylPREDNISolone (SOLU-MEDROL) injection  40 mg Intravenous Q8H  . multivitamin with minerals  1 tablet Oral Daily  . pantoprazole  40 mg Oral Daily  . simvastatin  40 mg Oral q1800   Continuous Infusions:     LOS: 4 days    Time spent: 28 minutes    Irwin Brakeman, MD [facility] Pager [phone]  If 7PM-7AM, please contact night-coverage [email or web address] Password [email or web address] 04/25/2016, 9:08 AM

## 2016-04-25 NOTE — Progress Notes (Signed)
Nutrition Follow-up  DOCUMENTATION CODES:   Severe malnutrition in context of acute illness/injury  INTERVENTION:   -D/c Boost Breeze po BID, due to poor acceptance -Increase Ensure Enlive po to BID, each supplement provides 350 kcal and 20 grams of protein  NUTRITION DIAGNOSIS:   Inadequate oral intake related to poor appetite, altered GI function as evidenced by percent weight loss.  Ongoing  GOAL:   Patient will meet greater than or equal to 90% of their needs  Progressing  MONITOR:   PO intake, Supplement acceptance, Labs, Weight trends, Skin, I & O's  REASON FOR ASSESSMENT:   Consult Assessment of nutrition requirement/status  ASSESSMENT:   66 y.o. male with past medical history significant for A. fib and coronary artery disease presented to the emergency room with chief complaint of shortness of breath of note the patient had been recently discharged from the hospital with a diagnosis of colitis. Patient states that after that leaving the hospital he never quite got her strength back. He has continued to have diarrhea over the last few days mostly watery.   Pt underwent MBSS on 04/24/16; pt was upgraded to a dysphagia 2 diet with thin liquids.   Pt receiving nursing care at time of visit. Per SLP notes, pt had difficulty tolerating ice cream last night, but was able to cough and clear it out. Pt is refusing Boost Breeze supplements per MAR.   Labs reviewed: CBGS: 137-237.   Diet Order:  DIET DYS 2 Room service appropriate?: Yes; Fluid consistency:: Thin  Skin:  Reviewed, no issues  Last BM:  04/21/16  Height:   Ht Readings from Last 1 Encounters:  04/24/16 5\' 4"  (1.626 m)    Weight:   Wt Readings from Last 1 Encounters:  04/25/16 136 lb 14.5 oz (62.1 kg)    Ideal Body Weight:  59.1 kg  BMI:  Body mass index is 23.49 kg/(m^2).  Estimated Nutritional Needs:   Kcal:  1600-1800  Protein:  85-95 grams  Fluid:  1.6-1.8 L/day  EDUCATION NEEDS:    No education needs identified at this time  Aydrien Froman A. Jimmye Norman, RD, LDN, CDE Pager: [phone] After hours Pager: [phone]

## 2016-04-25 NOTE — Progress Notes (Signed)
Speech Language Pathology Treatment: Dysphagia  Patient Details Name: Kenneth Merritt MRN: [id number] DOB: 03-24-1951 Today's Date: 04/25/2016 Time: [id number] SLP Time Calculation (min) (ACUTE ONLY): 13 min  Assessment / Plan / Recommendation Clinical Impression  Dysphagia treatment provided for diet check/ follow-up from yesterday's MBS. Upon entering room pt was asleep with rattling breath sounds. Cued pt to cough which resulted in some improvement in breath sounds; however cough was non-productive. Pt reported that the rattling breath sounds have been ongoing for about the past month. Pt showed no overt s/s of aspiration with trials of thin liquids/ dysphagia 2 consistency. Reviewed strategies with pt- small bites/ sips, clear throat intermittently, also discussed inhale-swallow-exhale pattern to improve breath/swallow coordination. Recommend continuing dysphagia 2 diet/ thin liquids- no straws with full supervision to cue pt for strategies during meal. Will continue to follow to ensure diet tolerance.   HPI HPI: Patient with a long history of respiratory issues and reports new onset of difficulty swallowing      SLP Plan  Continue with current plan of care     Recommendations  Diet recommendations: Dysphagia 2 (fine chop);Thin liquid Liquids provided via: Cup;No straw Medication Administration: Whole meds with puree Supervision: Patient able to self feed;Full supervision/cueing for compensatory strategies Compensations: Slow rate;Small sips/bites;Clear throat intermittently Postural Changes and/or Swallow Maneuvers: Seated upright 90 degrees             Oral Care Recommendations: Oral care BID Plan: Continue with current plan of care     [place], Jonesha Tsuchiya K, MA, CCC-SLP 04/25/2016, 4:09 PM [phone]

## 2016-04-25 NOTE — Progress Notes (Signed)
Patient has poor appetite just picks on his meals c/o constipation dulcolax sup. with no effect pt refused miralax po when  Offered. Pt refuses the boosts  supplements too.

## 2016-04-25 NOTE — Progress Notes (Signed)
Patient Name:  Kenneth Merritt, DOB: Feb 09, 1951, MRN: [id number] Primary Doctor: Neale Burly, MD Primary Cardiologist:   Date: 04/25/2016   SUBJECTIVE:  Patient is slowly improving. He is not having any cardiac complaints.   Past Medical History  Diagnosis Date  . GERD (gastroesophageal reflux disease)   . COPD (chronic obstructive pulmonary disease) ([place])     severe by PFTs August 2010  . 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 Atrial fibrillation (HCC)     post-op a-fib. 3/11. post Aortobifem , /  Atrial fibrillation from nebulizer treatment  May, 2012, while hospitalized  . Renal artery stenosis ([place])      presumably corrected at time of aortobifem surgery March, 2011  . Tobacco user      stop smoking  . Pneumothorax      2011  before aortobifemoral surgery,  resolved  . Peripheral vascular disease, unspecified ([place])     bilateral intermittent claudication  . CAD (coronary artery disease), native coronary artery     catheterization 06/2009.Marland KitchenMarland Kitchen70% proximal LAD and 30% proximal RCA. normal LV function, no intervention planned prior to surgery for aorta. LV normal function  by catheter 8/10.   [name] Dyslipidemia   . DJD (degenerative joint disease)   . Ejection fraction     60%, echo, May, 2012, rapid atrial fib at the time  . Cancer [facility]) 2013    Prostate Radiation  . Myocardial infarction ([place]) 2011  . On home O2     2L N/C  . Chronic thoracic back pain   . Chronic pain syndrome    Filed Vitals:   04/25/16 0200 04/25/16 0400 04/25/16 0600 04/25/16 0715  BP: 100/67 98/67 100/78 94/62  Pulse: 79 75 80 80  Temp:  97.2 F (36.2 C)  97.4 F (36.3 C)  TempSrc:  Axillary  Oral  Resp: 12 10  17   Height:      Weight:  136 lb 14.5 oz (62.1 kg)    SpO2: 100% 98% 99% 98%    Intake/Output Summary (Last 24 hours) at 04/25/16 0853 Last data filed at 04/25/16 0545  Gross per 24 hour    Intake    565 ml  Output    600 ml  Net    -35 ml   Filed Weights   04/23/16 0500 04/24/16 0400 04/25/16 0400  Weight: 138 lb 3.7 oz (62.7 kg) 133 lb 6.1 oz (60.5 kg) 136 lb 14.5 oz (62.1 kg)     LABS: Basic Metabolic Panel:  Recent Labs  04/23/16 0233 04/23/16 0751 04/24/16 0253 04/25/16 0300  NA 136  --   --  141  K 3.2*  --   --  4.1  CL 95*  --   --  101  CO2 35*  --   --  32  GLUCOSE 114*  --   --  164*  BUN <5*  --   --  <5*  CREATININE 0.47*  --   --  0.46*  CALCIUM 7.6*  --   --  8.0*  MG  --  1.6* 2.1  --    Liver Function Tests: No results for input(s): AST, ALT, ALKPHOS, BILITOT, PROT, ALBUMIN in the last 72 hours. No results for input(s): LIPASE, AMYLASE in the last 72 hours. CBC:  Recent Labs  04/23/16 0233 04/25/16 0300  WBC  6.7 9.3  HGB 8.6* 8.0*  HCT 28.1* 26.7*  MCV 88.4 89.6  PLT 199 213   Cardiac Enzymes: No results for input(s): CKTOTAL, CKMB, CKMBINDEX, TROPONINI in the last 72 hours. BNP: Invalid input(s): POCBNP D-Dimer: No results for input(s): DDIMER in the last 72 hours. Thyroid Function Tests: No results for input(s): TSH, T4TOTAL, T3FREE, THYROIDAB in the last 72 hours.  Invalid input(s): FREET3  RADIOLOGY: Dg Chest 2 View  03/29/2016  CLINICAL DATA:  Continuing chest pain and difficulty breathing starting 4 days ago. EXAM: CHEST  2 VIEW COMPARISON:  Chest x-rays dated 03/27/2016, 03/19/2016 and 09/19/2015. FINDINGS: Heart size is normal. Overall cardiomediastinal silhouette is stable in size and configuration. Lungs are hyperexpanded. Suspect chronic bronchitic changes centrally. Small areas of chronic scarring/ fibrosis are seen along the right lateral chest wall on at each lung apex, better characterized on recent chest CT. No evidence of pneumonia. No pleural effusion or pneumothorax seen. There are chronic-appearing compression fracture deformities within the mid thoracic spine, but the more superior vertebral body  demonstrates slight progression of the compression compared to the earlier plain film exam of 11/20/2015. IMPRESSION: 1. Hyperexpanded lungs indicating COPD/emphysema. 2. No evidence of acute cardiopulmonary abnormality. No evidence of pneumonia. No pleural effusion or pneumothorax. 3. Chronic compression fracture deformity within the mid thoracic spine, but slightly increased compression compared to the earlier plain film of 11/20/2015. Any acute/recent symptoms within the mid thoracic spine? Milder compression deformity of the immediately subjacent thoracic vertebral body is stable. Electronically Signed   By: Franki Cabot M.D.   On: 03/29/2016 18:35   Dg Chest 2 View  03/27/2016  CLINICAL DATA:  LEFT-sided chest pain.  Increased short of breath EXAM: CHEST  2 VIEW COMPARISON:  03/19/2016 FINDINGS: Lungs are hyperinflated. Normal cardiac silhouette. No effusion, infiltrate pneumothorax. Biapical pleural thickening. Compression deformities in the mid thoracic spine with mild kyphosis. IMPRESSION: 1.  No acute cardiopulmonary process. 2. Hyperinflated lungs suggest emphysema. Electronically Signed   By: Suzy Bouchard M.D.   On: 03/27/2016 16:48   Ct Chest Wo Contrast  04/22/2016  CLINICAL DATA:  Known chronic atrial fibrillation, peripheral vascular disease, status post aortofemoral bypass. Hospitalized 5/28 through 6/6 for sepsis. Dyspnea. EXAM: CT CHEST WITHOUT CONTRAST TECHNIQUE: Multidetector CT imaging of the chest was performed following the standard protocol without IV contrast. COMPARISON:  Chest CT dated 03/27/2016 FINDINGS: Mediastinum/Lymph Nodes: Scattered atherosclerotic changes of the normal-caliber thoracic aorta. Heart size is normal. No pericardial effusion. Coronary artery calcifications noted. Scattered small lymph nodes within the mediastinum, none of which are pathologic by CT size criteria. Esophagus appears normal. Trachea and central bronchi are unremarkable. Lungs/Pleura:  Emphysematous changes bilaterally, upper lobe predominant. New irregular nodule within the superior segment of the right upper lobe, measuring 1.7 x 0.8 cm (series 3, image 63). Additional new nodular consolidation more inferiorly within the right lower lobe, measures approximately 1.8 x 0.7 cm, possibly atelectasis or confluent scarring (series 3, image 104). Confluent scarring/ fibrosis noted at each lung apex. Upper abdomen: Small stone within the gallbladder. No evidence of acute cholecystitis. Limited images of the upper abdomen are otherwise unremarkable. Musculoskeletal: Chronic-appearing compression fracture deformities noted within the mid thoracic spine, slightly progressed at the T6 vertebral body level since recent chest CT of 03/27/2016, not significantly changed at the T7 and T8 vertebral body levels. No acute-appearing osseous abnormality. IMPRESSION: 1. New irregular nodular density within the superior segment of the right upper lobe, measuring 1.7 x 0.8 x  0.8 cm, suspicious for neoplastic nodule. Consider PET-CT for further characterization. Recommend surgical consultation for further workup considerations. 2. Additional new ill-defined nodular consolidation more inferiorly within the right lower lobe, measuring approximately 1.8 x 0.7 x 2.1 cm. This may also represent neoplastic process but is less suspicious, perhaps atelectasis or confluent scarring. PET-CT may be helpful for this finding as well. 3. Emphysema, upper lobe predominant. 4. Coronary artery calcifications, particularly dense within the left anterior descending coronary artery. Recommend correlation with any possible associated cardiac symptoms. 5. Cholelithiasis without evidence of acute cholecystitis. 6. Chronic-appearing compression fracture deformities of the T6 through T8 vertebral bodies. T6 compression has slightly progressed compared to the previous exam of 03/27/2016. T7 and T8 compression deformities appear stable. These  results will be called to the ordering clinician or representative by the Radiologist Assistant, and communication documented in the PACS or zVision Dashboard. Electronically Signed   By: Franki Cabot M.D.   On: 04/22/2016 18:21   Ct Angio Chest Pe W/cm &/or Wo Cm  03/27/2016  CLINICAL DATA:  65 year old male with COPD with increasing shortness of breath. EXAM: CT ANGIOGRAPHY CHEST WITH CONTRAST TECHNIQUE: Multidetector CT imaging of the chest was performed using the standard protocol during bolus administration of intravenous contrast. Multiplanar CT image reconstructions and MIPs were obtained to evaluate the vascular anatomy. CONTRAST:  100 cc Isovue 370 COMPARISON:  Chest radiograph dated 03/27/2016 FINDINGS: Evaluation of this exam is limited due to respiratory motion artifact. There is emphysematous changes of the lungs with biapical scarring. There is no focal consolidation, pleural effusion, or pneumothorax. The central airways are patent. There is mild atherosclerotic calcification of the thoracic aorta. No aneurysmal dilatation or dissection. The origins of the great vessels of the aortic arch appear patent. No CT evidence of pulmonary embolism. Evaluation however is limited due to respiratory motion artifact. There is no cardiomegaly or pericardial effusion. No hilar or mediastinal adenopathy. The esophagus and the thyroid gland are grossly unremarkable. There is no axillary adenopathy. The chest wall soft tissues appear unremarkable. There is osteopenia with degenerative changes of the spine. There is age indeterminate compression deformity of the T7 vertebra with approximately 50% loss of vertebral body height and anterior wedging. Clinical correlation is recommended. Mild age indeterminate compression deformities of the T8 and T9 vertebrae is also noted. No definite acute fracture identified. Review of the MIP images confirms the above findings. IMPRESSION: No CT evidence of pulmonary embolism.  Emphysema.  No focal consolidation or pneumothorax. Age indeterminate compression fracture of the T7 vertebrae. Clinical correlation is recommended. Electronically Signed   By: Anner Crete M.D.   On: 03/27/2016 22:24   Ct Abdomen Pelvis W Contrast  04/07/2016  CLINICAL DATA:  Left flank pain. Lethargy, frequent falls. Elevated white blood cell count. EXAM: CT ABDOMEN AND PELVIS WITH CONTRAST TECHNIQUE: Multidetector CT imaging of the abdomen and pelvis was performed using the standard protocol following bolus administration of intravenous contrast. CONTRAST:  122mL ISOVUE-300 IOPAMIDOL (ISOVUE-300) INJECTION 61% COMPARISON:  Chest CT 03/27/2016. Report from CT abdomen/ pelvis 12/26/2012, images not available. CT 03/03/2012 FINDINGS: Lower chest: Consolidation in the lingula with air bronchograms, new from chest CT 10 days prior. Small focus of tree in bud opacities in the right lower lobe is also new. Right middle lobe scarring is unchanged. Liver: No focal lesion. Hepatobiliary: Gallbladder distention but no calcified stone or pericholecystic inflammation. Common bile duct normal in caliber. Pancreas: No ductal dilatation or inflammation. Spleen: Normal in size, no focal  lesion. Adrenal glands: No nodule. Kidneys: Symmetric renal enhancement and excretion. No hydronephrosis. No perinephric stranding or focal abnormality. Stomach/Bowel: Stomach physiologically distended. There are no dilated or thickened small bowel loops. Mild soft tissue stranding in the left pericolic gutter, there is a questionable colonic wall thickening and small diverticulum at the junction of the descending and sigmoid colon best depicted on coronal image 53 series 602. This is in the region of the pericolonic fat stranding. No definite diverticular disease elsewhere. No pneumatosis or free air. Portions of the appendix are visualized and normal. There is no right-sided inflammation. Vascular/Lymphatic: No retroperitoneal adenopathy.  Post abdominal aortobifemoral bypass graft no periaortic soft tissue stranding. The graft appears patent. Aortic dimensions at the proximal aspect of the graft 2.3 cm maximal dimension. No large vessel mesenteric occlusion. The IMA is not seen, likely excluded postsurgical. Celiac and superior mesenteric arteries are patent. Reproductive:  Brachytherapy seeds in the prostate gland. Bladder: Distended.  No bladder wall thickening. Other: No free air, free fluid, or intra-abdominal fluid collection. Musculoskeletal: There are no acute or suspicious osseous abnormalities. Avascular necrosis of both femoral heads. Question subchondral collapse on the right. IMPRESSION: 1. Soft tissue stranding in the left pericolic gutter. There short segment colonic wall thickening with possible small diverticulum at the junction of the descending and sigmoid colon a region of fat stranding. Differential considerations include mild acute diverticulitis, however no significant diverticular burden is seen elsewhere. Focal colitis is considered, with distribution suggesting ischemic etiology. 2. 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 3. Lingular consolidation is new from chest CT eleven days prior. Additional right lower lobe tree in bud opacities. Findings suspicious for infectious etiology/ pneumonia and bronchiolitis. Electronically Signed   By: Jeb Levering M.D.   On: 04/07/2016 01:51   Dg Chest Portable 1 View  04/24/2016  CLINICAL DATA:  Respiratory failure.  Shortness of breath. EXAM: PORTABLE CHEST 1 VIEW COMPARISON:  04/23/2016.  CT 04/22/2016. FINDINGS: Mediastinum hilar structures normal. Heart size normal. Previously identified nodular density in the right mid lung is again best identified by CT. No pleural effusion or pneumothorax. No acute bony abnormality. IMPRESSION: 1. No acute cardiopulmonary disease. 2. Previously identified small right mid lung nodular density is again best identified by prior  CT of 04/22/2016. Reference is made to that report. Electronically Signed   By: Marcello Moores  Register   On: 04/24/2016 07:37   Dg Chest Port 1 View  04/23/2016  CLINICAL DATA:  Shortness of breath with cough and congestion EXAM: PORTABLE CHEST 1 VIEW COMPARISON:  Chest radiograph April 21, 2016 and chest CT April 22, 2016 FINDINGS: There is scarring in each upper lobe and right mid lung region, stable. Lungs overall appear mildly hyperexpanded. There is no frank edema or consolidation. There is a the focal opacity in the periphery of the right mid lung measuring 1.2 x 1.0 cm which is felt to correspond to the lesions seen on CT 1 day prior. This lesion is better seen by CT. Heart size and pulmonary vascularity are within normal limits. No adenopathy evident. IMPRESSION: Nodular opacity in the periphery the right mid lung, better seen on CT 1 day prior. Areas of scarring, primarily in the upper lobes near the apices. Lungs hyperexpanded. No edema or consolidation. Stable cardiac silhouette. Electronically Signed   By: Lowella Grip III M.D.   On: 04/23/2016 07:30   Dg Chest Portable 1 View  04/21/2016  CLINICAL DATA:  Dyspnea EXAM: PORTABLE CHEST 1 VIEW COMPARISON:  04/13/2016 FINDINGS: Cardiac shadow is stable. Mild interstitial changes are noted somewhat accentuated by the portable technique. No focal confluent infiltrate or sizable effusion is noted. No bony abnormality is seen. IMPRESSION: No change from the previous exam. Electronically Signed   By: Inez Catalina M.D.   On: 04/21/2016 16:46   Dg Chest Port 1 View  04/13/2016  CLINICAL DATA:  Dyspnea for 1 hour EXAM: PORTABLE CHEST 1 VIEW COMPARISON:  04/13/2016 FINDINGS: Advanced emphysema and biapical scarring. No evidence of acute superimposed pneumonia or edema. No effusion or pneumothorax (linear lucency at both bases attributed of Mach effect). Normal heart size and mediastinal contours. IMPRESSION: Emphysema and lung scarring without acute superimposed  finding. Electronically Signed   By: Monte Fantasia M.D.   On: 04/13/2016 22:49   Dg Chest Port 1 View  04/11/2016  CLINICAL DATA:  Short of breath EXAM: PORTABLE CHEST 1 VIEW COMPARISON:  04/11/2016 FINDINGS: COPD with pulmonary hyperinflation apical scarring is unchanged. No superimposed infiltrate or effusion. Negative for mass or adenopathy. IMPRESSION: COPD, stable.  No superimposed acute abnormality. Electronically Signed   By: Franchot Gallo M.D.   On: 04/11/2016 08:08   Dg Chest Port 1 View  04/10/2016  CLINICAL DATA:  Shortness of Breath EXAM: PORTABLE CHEST 1 VIEW COMPARISON:  04/07/2016 FINDINGS: Cardiac shadow is stable. The lungs are well aerated bilaterally. No focal infiltrate or sizable effusion is seen. No acute bony abnormality is noted. IMPRESSION: No acute abnormality noted. Electronically Signed   By: Inez Catalina M.D.   On: 04/10/2016 08:09   Dg Chest Port 1 View  04/07/2016  CLINICAL DATA:  Worsening shortness of breath. EXAM: PORTABLE CHEST 1 VIEW COMPARISON:  Radiograph yesterday at 1832 hours. Chest CT 03/27/2016 FINDINGS: Diminished vascular congestion from prior exam. Improved left basilar aeration with minimal residual patchy opacity. The lingular opacity on CT is not well seen. Hyperinflation and emphysema. Biapical and mid lung zone scarring again seen. Cardiomediastinal contours are unchanged. IMPRESSION: Decreasing pulmonary vascular congestion. Lingular opacity on CT is not well seen. Minimal patchy left basilar opacity, with mild improvement from prior radiograph. Electronically Signed   By: Jeb Levering M.D.   On: 04/07/2016 05:55   Dg Chest Portable 1 View  04/06/2016  CLINICAL DATA:  Status post fall, with worsening shortness of breath. Syncope and lower extremity weakness. Initial encounter. EXAM: PORTABLE CHEST 1 VIEW COMPARISON:  Chest radiograph performed 04/02/2016 FINDINGS: The lungs are well-aerated. Vascular congestion is noted. Mild left basilar airspace  opacity could reflect pneumonia. Mild right midlung and right apical scarring is noted. There is no evidence of pleural effusion or pneumothorax. The cardiomediastinal silhouette is within normal limits. No acute osseous abnormalities are seen. IMPRESSION: Vascular congestion noted. Mild left basilar opacity could reflect pneumonia. Mild right midlung and right apical scarring noted. Electronically Signed   By: Garald Balding M.D.   On: 04/06/2016 19:00   Dg Chest Port 1 View  04/02/2016  CLINICAL DATA:  Shortness of breath, emphysema, COPD EXAM: PORTABLE CHEST 1 VIEW COMPARISON:  03/31/2016 FINDINGS: The heart size and mediastinal contours are within normal limits. Both lungs are clear. The visualized skeletal structures are unremarkable. IMPRESSION: No active disease. Electronically Signed   By: Kathreen Devoid   On: 04/02/2016 13:14   Dg Abd Acute W/chest  04/13/2016  CLINICAL DATA:  Diarrhea, nausea, shortness of breath. Increased weakness for 1 day. EXAM: DG ABDOMEN ACUTE W/ 1V CHEST COMPARISON:  04/11/2016 FINDINGS: Gas within mildly prominent colon  and nondilated small bowel loops. No free air organomegaly. Mild hyperinflation of the lungs compatible with COPD. Heart is normal size. Biapical scarring. No confluent opacities or effusions. No acute bony abnormality. IMPRESSION: No evidence of bowel obstruction or free air. COPD/chronic changes.  No active disease. Electronically Signed   By: Rolm Baptise M.D.   On: 04/13/2016 10:35   Dg Abd Portable 1v  04/10/2016  CLINICAL DATA:  Abdominal distension. EXAM: PORTABLE ABDOMEN - 1 VIEW COMPARISON:  CT, 04/07/2016 FINDINGS: There is increased gas in both the colon and small bowel with no significant bowel dilation. Some air is seen within the rectum. There are multiple surgical vascular clips along the abdominal midline. Vascular calcifications are noted along the aorta and iliac vessels. IMPRESSION: 1. No convincing bowel obstruction. 2. Increased gas noted  within the small bowel and colon suggesting a mild diffuse adynamic ileus. Electronically Signed   By: Lajean Manes M.D.   On: 04/10/2016 14:13   Dg Swallowing Func-speech Pathology  04/24/2016  Objective Swallowing Evaluation: Type of Study: MBS-Modified Barium Swallow Study Patient Details Name: Kenneth Merritt MRN: [id number] Date of Birth: [birth year] Today's Date: 04/24/2016 Time: SLP Start Time (ACUTE ONLY): 1149-SLP Stop Time (ACUTE ONLY): 1209 SLP Time Calculation (min) (ACUTE ONLY): 20 min Past Medical History: Past Medical History Diagnosis Date . GERD (gastroesophageal reflux disease)  . COPD (chronic obstructive pulmonary disease) ([place])    severe by PFTs August 2010 . 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 Atrial fibrillation ([facility])    post-op a-fib. 3/11. post Aortobifem , /  Atrial fibrillation from nebulizer treatment  May, 2012, while hospitalized . Renal artery stenosis ([place])     presumably corrected at time of aortobifem surgery March, 2011 . Tobacco user     stop smoking . Pneumothorax     2011  before aortobifemoral surgery,  resolved . Peripheral vascular disease, unspecified ([place])    bilateral intermittent claudication . CAD (coronary artery disease), native coronary artery    catheterization 06/2009.Marland KitchenMarland Kitchen70% proximal LAD and 30% proximal RCA. normal LV function, no intervention planned prior to surgery for aorta. LV normal function  by catheter 8/10.  [name] Dyslipidemia  . DJD (degenerative joint disease)  . Ejection fraction    60%, echo, May, 2012, rapid atrial fib at the time . Cancer [facility]) 2013   Prostate Radiation . Myocardial infarction ([place]) 2011 . On home O2    2L N/C . Chronic thoracic back pain  . Chronic pain syndrome  Past Surgical History: Past Surgical History Procedure Laterality Date . Abdominal aortic aneurysm repair  01/11/2010 . Pr vein bypass graft,aorto-fem-pop   . Inguinal hernia repair     RIGHT  HPI: Patient with a long history of respiratory issues and reports new onset of difficulty swallowing Subjective: pt says he has trouble swallowing his pills Assessment / Plan / Recommendation CHL IP CLINICAL IMPRESSIONS 04/24/2016 Therapy Diagnosis Mild oral phase dysphagia;Mild pharyngeal phase dysphagia Clinical Impression Pt has a mild oropharyngeal dysphagia with no aspiration observed during this study. He has brief oral holding with thin liquids and prolonged mastication of solids, likely due to lack of dentition. He does have difficulty posteriorly transiting a halved barium tablet (broken at his request), which leads to premature spillage of thin liquid that is subsequently penetrated. This small amount of penetration occurs silently, but is cleared with a cued throat  clear. Recommend Dys 2 diet and thin liquids, with no mixed consistencies.  Impact on safety and function Mild aspiration risk   CHL IP TREATMENT RECOMMENDATION 04/24/2016 Treatment Recommendations Therapy as outlined in treatment plan below   Prognosis 04/24/2016 Prognosis for Safe Diet Advancement Fair Barriers to Reach Goals -- Barriers/Prognosis Comment -- CHL IP DIET RECOMMENDATION 04/24/2016 SLP Diet Recommendations Dysphagia 2 (Fine chop) solids;Thin liquid Liquid Administration via Cup;No straw Medication Administration Whole meds with puree Compensations Slow rate;Small sips/bites;Clear throat intermittently Postural Changes Remain semi-upright after after feeds/meals (Comment);Seated upright at 90 degrees   CHL IP OTHER RECOMMENDATIONS 04/24/2016 Recommended Consults -- Oral Care Recommendations Oral care BID Other Recommendations --   CHL IP FOLLOW UP RECOMMENDATIONS 04/24/2016 Follow up Recommendations (No Data)   CHL IP FREQUENCY AND DURATION 04/24/2016 Speech Therapy Frequency (ACUTE ONLY) min 2x/week Treatment Duration 2 weeks      CHL IP ORAL PHASE 04/24/2016 Oral Phase Impaired Oral - Pudding Teaspoon -- Oral - Pudding Cup -- Oral -  Honey Teaspoon -- Oral - Honey Cup -- Oral - Nectar Teaspoon -- Oral - Nectar Cup -- Oral - Nectar Straw -- Oral - Thin Teaspoon -- Oral - Thin Cup Holding of bolus Oral - Thin Straw Holding of bolus;Premature spillage Oral - Puree WFL Oral - Mech Soft Impaired mastication Oral - Regular -- Oral - Multi-Consistency -- Oral - Pill Delayed oral transit Oral Phase - Comment --  CHL IP PHARYNGEAL PHASE 04/24/2016 Pharyngeal Phase Impaired Pharyngeal- Pudding Teaspoon -- Pharyngeal -- Pharyngeal- Pudding Cup -- Pharyngeal -- Pharyngeal- Honey Teaspoon -- Pharyngeal -- Pharyngeal- Honey Cup -- Pharyngeal -- Pharyngeal- Nectar Teaspoon -- Pharyngeal -- Pharyngeal- Nectar Cup -- Pharyngeal -- Pharyngeal- Nectar Straw -- Pharyngeal -- Pharyngeal- Thin Teaspoon -- Pharyngeal -- Pharyngeal- Thin Cup WFL Pharyngeal -- Pharyngeal- Thin Straw Penetration/Aspiration before swallow Pharyngeal Material enters airway, remains ABOVE vocal cords and not ejected out Pharyngeal- Puree WFL Pharyngeal -- Pharyngeal- Mechanical Soft WFL Pharyngeal -- Pharyngeal- Regular -- Pharyngeal -- Pharyngeal- Multi-consistency -- Pharyngeal -- Pharyngeal- Pill WFL Pharyngeal -- Pharyngeal Comment --  CHL IP CERVICAL ESOPHAGEAL PHASE 04/24/2016 Cervical Esophageal Phase WFL Pudding Teaspoon -- Pudding Cup -- Honey Teaspoon -- Honey Cup -- Nectar Teaspoon -- Nectar Cup -- Nectar Straw -- Thin Teaspoon -- Thin Cup -- Thin Straw -- Puree -- Mechanical Soft -- Regular -- Multi-consistency -- Pill -- Cervical Esophageal Comment -- No flowsheet data found. Germain Osgood, M.A. CCC-SLP [phone] Germain Osgood 04/24/2016, 2:07 PM               PHYSICAL EXAM  Patient is comfortable in bed. He is oriented to person time and place. Lungs reveal scattered rhonchi. Cardiac exam reveals S1 and S2. There is no significant edema.   TELEMETRY:  I have reviewed telemetry today April 25, 2016. He is in sinus rhythm.    ASSESSMENT AND PLAN:    Atrial  fibrillation with RVR (HCC)   PAF (paroxysmal atrial fibrillation) (HCC)     His rate was fast when his pulmonary status was unstable. He is converted back to sinus rhythm. IV Cardizem drip has been stopped. Before admission he was on low-dose beta blocker. Because of his lung disease, I feel it will be more prudent to use low-dose diltiazem. I will stop his digoxin. I will put him on a small dose of long-acting diltiazem.    HX: anticoagulation     He is back on his Eliquis   Dola Argyle 04/25/2016 8:53 AM

## 2016-04-25 NOTE — Consult Note (Signed)
SLP Cancellation Note  Patient Details Name: Kenneth Merritt MRN: [id number] DOB: [birth year]   Cancelled treatment:        Unable to assess tolerance of diet at this time, as pt is unavailable (nursing care). RN reports difficulty with ice cream last night, reporting pt got choked, but was able to cough and clear it out. ST will continue to follow to assess diet tolerance.  Shonna Chock 04/25/2016, 10:12 AM  Enriqueta Shutter [facility], [place] [phone]

## 2016-04-26 DIAGNOSIS — E785 Hyperlipidemia, unspecified: Secondary | ICD-10-CM

## 2016-04-26 LAB — CBC
HEMATOCRIT: 29.4 % — AB (ref 39.0–52.0)
HEMOGLOBIN: 8.9 g/dL — AB (ref 13.0–17.0)
MCH: 27.1 pg (ref 26.0–34.0)
MCHC: 30.3 g/dL (ref 30.0–36.0)
MCV: 89.4 fL (ref 78.0–100.0)
Platelets: 264 10*3/uL (ref 150–400)
RBC: 3.29 MIL/uL — AB (ref 4.22–5.81)
RDW: 15.3 % (ref 11.5–15.5)
WBC: 10 10*3/uL (ref 4.0–10.5)

## 2016-04-26 LAB — COMPREHENSIVE METABOLIC PANEL
ALBUMIN: 2.1 g/dL — AB (ref 3.5–5.0)
ALT: 30 U/L (ref 17–63)
ANION GAP: 4 — AB (ref 5–15)
AST: 25 U/L (ref 15–41)
Alkaline Phosphatase: 96 U/L (ref 38–126)
BILIRUBIN TOTAL: 0.5 mg/dL (ref 0.3–1.2)
BUN: 8 mg/dL (ref 6–20)
CO2: 35 mmol/L — ABNORMAL HIGH (ref 22–32)
Calcium: 8.7 mg/dL — ABNORMAL LOW (ref 8.9–10.3)
Chloride: 101 mmol/L (ref 101–111)
Creatinine, Ser: 0.5 mg/dL — ABNORMAL LOW (ref 0.61–1.24)
GFR calc non Af Amer: >60 mL/min (ref >60)
GLUCOSE: 190 mg/dL — AB (ref 65–99)
POTASSIUM: 4.9 mmol/L (ref 3.5–5.1)
Sodium: 140 mmol/L (ref 135–145)
TOTAL PROTEIN: 5.1 g/dL — AB (ref 6.5–8.1)

## 2016-04-26 LAB — GLUCOSE, CAPILLARY
GLUCOSE-CAPILLARY: 180 mg/dL — AB (ref 65–99)
GLUCOSE-CAPILLARY: 223 mg/dL — AB (ref 65–99)
Glucose-Capillary: 195 mg/dL — ABNORMAL HIGH (ref 65–99)
Glucose-Capillary: 215 mg/dL — ABNORMAL HIGH (ref 65–99)

## 2016-04-26 NOTE — Progress Notes (Signed)
Patient ID: Kenneth Merritt, male   DOB: 1951-08-19, 65 y.o.   MRN: [id number]    PROGRESS NOTE    STEPHANIE TOUHEY  [id number] DOB: [birth year] DOA: 04/21/2016  PCP: Neale Burly, MD   Brief Narrative:  Pt is 65 y.o. male with known chronic atrial fibrillation on Eliquis, hyperlipidemia, peripheral vascular disease status post aorta femoral bypass, recent hospitalization from 04/06/16 - 04/15/2016 for sepsis secondary to diverticulitis, presented to [facility] ED with main concern of progressive weakness, fatigue, exertional dyspnea as well as some dyspnea at rest, mid area chest discomfort that occurs with coughing spells, poor oral intake.  In ED, pt noted to be in a-fib with RVR and HR up to 120's, SBP as low as 80's, blood work notable for WBC 12 K, Hg 9.9, K 3.3, troponins 0.07 --> 0.06 --> 0.06, pt admitted to [facility] unit by [facility]. Pt started on Cardizem drip.   Major events since admission: 6/13 - pt is a-fib, on cardizem drip, cardiology consulted  6/14 - pt more lethargic, still in A-fib, Converted to NSR, PCCM consulted  6/16 - started on long-acting diltiazem by cardiology  Assessment & Plan:   Principal Problem:   Atrial fibrillation with RVR, CHADS2 score 4 (HCC) - NSR, rate in 80-90s - continue Apixaban  - appreciate cardiology team following  - transfer to telemetry. - started long-acting diltiazem (cardizem 120 mg) daily on 6/16.  Active Problems:   Acute metabolic encephalopathy - secondary to acute COPD and a-fib with RVR - now resolved    Elevated troponins - flat pattern   - cardio following     Acute hypoxic respiratory failure, Acute exacerbation of COPD (chronic obstructive pulmonary disease), ? Aspiration - respiratory status slowly improving - still on oxygen via [place] 2 L - continue solumedrol --> weaning down as tolerated - continue BD's scheduled and as needed, started oral augmentin 6/16.   - CT chest with RUL nodule, ? Neoplastic nodule with additional new  ill-defined nodular consolidation right lower lobe - PCCM assistance appreciated - will need to follow up with pulmonary outpatient.   Peripheral vascular disease, unspecified ([place]) CAD (coronary artery disease), native coronary artery - Pt not having chest pain symptoms    Hypokalemia and hypomagnesemia - repleted    Hyponatremia - resolved    Chronic diastolic CHF, grade I - per last 2 D ECHO 03/2016 - on lasix at home but here on hold per cardiology team  - weight trend since admission: 62 kg --> 60 kg  - monitor daily weights, strict I/O    Anemia of acute illness - drop in Hg since admission but no signs of active bleeding - Hemoglobin holding stable at 8.5    Hyperglycemia - A1C 6.6 - keep on SSI for now     Protein calorie malnutrition, severe  - nutritionist consulted, assistance and recommendations appreciated   DVT prophylaxis: Apixaban Code Status: Full Family Communication: Patient at bedside, family called and left message to call me back Disposition Plan: telemeetry  Consultants:   Cardiology   PCCM  Procedures:   Plan for MBS today   Antimicrobials:   Unasyn 6/14 -->6/16  Augmentin 6/16   Subjective: No acute changes but says that he is feeling a little better.    Objective: Filed Vitals:   04/26/16 0500 04/26/16 0814 04/26/16 0840 04/26/16 1156  BP:   107/68   Pulse:   98   Temp:  97.1 F (36.2 C)  97.5 F (  36.4 C)  TempSrc:  Oral  Oral  Resp:   17   Height:      Weight: 133 lb 3.2 oz (60.419 kg)     SpO2:  99% 96%     Intake/Output Summary (Last 24 hours) at 04/26/16 1313 Last data filed at 04/26/16 1100  Gross per 24 hour  Intake    492 ml  Output   1770 ml  Net  -1278 ml   Filed Weights   04/24/16 0400 04/25/16 0400 04/26/16 0500  Weight: 133 lb 6.1 oz (60.5 kg) 136 lb 14.5 oz (62.1 kg) 133 lb 3.2 oz (60.419 kg)    Examination:  General exam: Appears more alert, NAD Respiratory system: tight but better air movement  today.  Cardiovascular system: RRR. No rubs, gallops or clicks.  Gastrointestinal system: Abdomen is nondistended, soft and nontender.  Central nervous system: A&O x 3, moving all 4 extremities again gravity  Extremities: Symmetric 5 x 5 power. Skin: No rashes, lesions or ulcers  Data Reviewed: I have personally reviewed following labs and imaging studies  CBC:  Recent Labs Lab 04/21/16 1807 04/22/16 0212 04/23/16 0233 04/25/16 0300 04/26/16 0320  WBC 12.1* 8.8 6.7 9.3 10.0  NEUTROABS 9.7*  --   --   --   --   HGB 9.9* 9.1* 8.6* 8.0* 8.9*  HCT 30.8* 29.9* 28.1* 26.7* 29.4*  MCV 87.0 88.2 88.4 89.6 89.4  PLT 207 199 199 213 [id number]   Basic Metabolic Panel:  Recent Labs Lab 04/21/16 1807 04/22/16 0212 04/23/16 0233 04/23/16 0751 04/24/16 0253 04/25/16 0300 04/26/16 0320  NA 132* 134* 136  --   --  141 140  K 3.3* 3.1* 3.2*  --   --  4.1 4.9  CL 92* 94* 95*  --   --  101 101  CO2 29 33* 35*  --   --  32 35*  GLUCOSE 137* 142* 114*  --   --  164* 190*  BUN 12 6 <5*  --   --  <5* 8  CREATININE 0.98 0.76 0.47*  --   --  0.46* 0.50*  CALCIUM 7.7* 7.8* 7.6*  --   --  8.0* 8.7*  MG 1.5*  --   --  1.6* 2.1  --   --    Cardiac Enzymes:  Recent Labs Lab 04/21/16 1807 04/21/16 2223 04/22/16 0212  TROPONINI 0.07* 0.06* 0.05*   CBG:  Recent Labs Lab 04/25/16 1128 04/25/16 1650 04/25/16 2131 04/26/16 0819 04/26/16 1155  GLUCAP 159* 176* 216* 195* 215*   Urine analysis:    Component Value Date/Time   COLORURINE AMBER* 04/06/2016 1942   APPEARANCEUR CLOUDY* 04/06/2016 1942   LABSPEC 1.021 04/06/2016 1942   PHURINE 5.0 04/06/2016 1942   GLUCOSEU 250* 04/06/2016 1942   HGBUR MODERATE* 04/06/2016 1942   BILIRUBINUR MODERATE* 04/06/2016 1942   KETONESUR 40* 04/06/2016 1942   PROTEINUR NEGATIVE 04/06/2016 1942   UROBILINOGEN 0.2 01/21/2010 1944   NITRITE NEGATIVE 04/06/2016 1942   LEUKOCYTESUR NEGATIVE 04/06/2016 1942   Recent Results (from the past 240 hour(s))   MRSA PCR Screening     Status: None   Collection Time: 04/21/16 11:00 PM  Result Value Ref Range Status   MRSA by PCR NEGATIVE NEGATIVE Final    Radiology Studies: Dg Chest Portable 1 View 04/21/2016  No change from the previous exam.  Ct Chest Wo Contrast 04/22/2016 New irregular nodular density within the superior segment of the right upper lobe, measuring 1.7  x 0.8 x 0.8 cm, suspicious for neoplastic nodule. Consider PET-CT for further characterization. Recommend surgical consultation for further workup considerations. 2. Additional new ill-defined nodular consolidation more inferiorly within the right lower lobe, measuring approximately 1.8 x 0.7 x 2.1 cm. This may also represent neoplastic process but is less suspicious, perhaps atelectasis or confluent scarring. PET-CT may be helpful for this finding as well. 3. Emphysema, upper lobe predominant. 4. Coronary artery calcifications, particularly dense within the left anterior descending coronary artery. Recommend correlation with any possible associated cardiac symptoms. 5. Cholelithiasis without evidence of acute cholecystitis. 6. Chronic-appearing compression fracture deformities of the T6 through T8 vertebral bodies. T6 compression has slightly progressed compared to the previous exam of 03/27/2016. T7 and T8 compression deformities appear stable. These results will be called to the ordering clinician or representative by the Radiologist Assistant, and communication documented in the PACS or zVision Dashboard.  Dg Chest Port 1 View 04/23/2016  Nodular opacity in the periphery the right mid lung, better seen on CT 1 day prior. Areas of scarring, primarily in the upper lobes near the apices. Lungs hyperexpanded. No edema or consolidation. Stable cardiac silhouette.    Scheduled Meds: . amoxicillin-clavulanate  1 tablet Oral BID  . apixaban  5 mg Oral BID  . atorvastatin  20 mg Oral q1800  . diltiazem  120 mg Oral Daily  . feeding supplement  (ENSURE ENLIVE)  237 mL Oral BID BM  . gabapentin  300 mg Oral TID  . insulin aspart  0-15 Units Subcutaneous TID WC  . ipratropium  0.5 mg Nebulization Q6H WA  . levalbuterol  1.25 mg Nebulization Q6H WA  . methylPREDNISolone (SOLU-MEDROL) injection  40 mg Intravenous Q12H  . multivitamin with minerals  1 tablet Oral Daily  . pantoprazole  40 mg Oral Daily  . polyethylene glycol  17 g Oral Daily   Continuous Infusions:    LOS: 5 days   Time spent: 14 minutes   Irwin Brakeman, MD [facility] Pager [phone]  If 7PM-7AM, please contact night-coverage [email or web address] Password [email or web address] 04/26/2016, 1:13 PM

## 2016-04-26 NOTE — Progress Notes (Signed)
Patient ID: Kenneth Merritt, male   DOB: 1951-03-18, 65 y.o.   MRN: [id number]      Patient Name:  Kenneth Merritt, DOB: Apr 09, 1951, MRN: [id number] Primary Doctor: Neale Burly, MD Primary Cardiologist:   Date: 04/26/2016   SUBJECTIVE:  No chest pain mild dyspnea issues swallowing food    Past Medical History  Diagnosis Date  . GERD (gastroesophageal reflux disease)   . COPD (chronic obstructive pulmonary disease) ([place])     severe by PFTs August 2010  . 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 Atrial fibrillation (HCC)     post-op a-fib. 3/11. post Aortobifem , /  Atrial fibrillation from nebulizer treatment  May, 2012, while hospitalized  . Renal artery stenosis ([place])      presumably corrected at time of aortobifem surgery March, 2011  . Tobacco user      stop smoking  . Pneumothorax      2011  before aortobifemoral surgery,  resolved  . Peripheral vascular disease, unspecified ([place])     bilateral intermittent claudication  . CAD (coronary artery disease), native coronary artery     catheterization 06/2009.Marland KitchenMarland Kitchen70% proximal LAD and 30% proximal RCA. normal LV function, no intervention planned prior to surgery for aorta. LV normal function  by catheter 8/10.   [name] Dyslipidemia   . DJD (degenerative joint disease)   . Ejection fraction     60%, echo, May, 2012, rapid atrial fib at the time  . Cancer [facility]) 2013    Prostate Radiation  . Myocardial infarction ([place]) 2011  . On home O2     2L N/[place]  . Chronic thoracic back pain   . Chronic pain syndrome    Filed Vitals:   04/26/16 0404 04/26/16 0500 04/26/16 0814 04/26/16 0840  BP: 125/89   107/68  Pulse: 107   98  Temp: 97.4 F (36.3 C)  97.1 F (36.2 C)   TempSrc: Oral  Oral   Resp: 20   17  Height:      Weight:  133 lb 3.2 oz (60.419 kg)    SpO2: 95%  99% 96%    Intake/Output Summary (Last 24 hours) at 04/26/16 1055 Last data filed at  04/26/16 0850  Gross per 24 hour  Intake    612 ml  Output   1219 ml  Net   -607 ml   Filed Weights   04/24/16 0400 04/25/16 0400 04/26/16 0500  Weight: 133 lb 6.1 oz (60.5 kg) 136 lb 14.5 oz (62.1 kg) 133 lb 3.2 oz (60.419 kg)     LABS: Basic Metabolic Panel:  Recent Labs  04/24/16 0253 04/25/16 0300 04/26/16 0320  NA  --  141 140  K  --  4.1 4.9  CL  --  101 101  CO2  --  32 35*  GLUCOSE  --  164* 190*  BUN  --  <5* 8  CREATININE  --  0.46* 0.50*  CALCIUM  --  8.0* 8.7*  MG 2.1  --   --    Liver Function Tests:  Recent Labs  04/26/16 0320  AST 25  ALT 30  ALKPHOS 96  BILITOT 0.5  PROT 5.1*  ALBUMIN 2.1*   No results for input(s): LIPASE, AMYLASE in the last 72 hours. CBC:  Recent Labs  04/25/16 0300 04/26/16 0320  WBC 9.3 10.0  HGB  8.0* 8.9*  HCT 26.7* 29.4*  MCV 89.6 89.4  PLT 213 264     RADIOLOGY: Dg Chest 2 View  03/29/2016  CLINICAL DATA:  Continuing chest pain and difficulty breathing starting 4 days ago. EXAM: CHEST  2 VIEW COMPARISON:  Chest x-rays dated 03/27/2016, 03/19/2016 and 09/19/2015. FINDINGS: Heart size is normal. Overall cardiomediastinal silhouette is stable in size and configuration. Lungs are hyperexpanded. Suspect chronic bronchitic changes centrally. Small areas of chronic scarring/ fibrosis are seen along the right lateral chest wall on at each lung apex, better characterized on recent chest CT. No evidence of pneumonia. No pleural effusion or pneumothorax seen. There are chronic-appearing compression fracture deformities within the mid thoracic spine, but the more superior vertebral body demonstrates slight progression of the compression compared to the earlier plain film exam of 11/20/2015. IMPRESSION: 1. Hyperexpanded lungs indicating COPD/emphysema. 2. No evidence of acute cardiopulmonary abnormality. No evidence of pneumonia. No pleural effusion or pneumothorax. 3. Chronic compression fracture deformity within the mid thoracic  spine, but slightly increased compression compared to the earlier plain film of 11/20/2015. Any acute/recent symptoms within the mid thoracic spine? Milder compression deformity of the immediately subjacent thoracic vertebral body is stable. Electronically Signed   By: Franki Cabot M.D.   On: 03/29/2016 18:35   Dg Chest 2 View  03/27/2016  CLINICAL DATA:  LEFT-sided chest pain.  Increased short of breath EXAM: CHEST  2 VIEW COMPARISON:  03/19/2016 FINDINGS: Lungs are hyperinflated. Normal cardiac silhouette. No effusion, infiltrate pneumothorax. Biapical pleural thickening. Compression deformities in the mid thoracic spine with mild kyphosis. IMPRESSION: 1.  No acute cardiopulmonary process. 2. Hyperinflated lungs suggest emphysema. Electronically Signed   By: Suzy Bouchard M.D.   On: 03/27/2016 16:48   Ct Chest Wo Contrast  04/22/2016  CLINICAL DATA:  Known chronic atrial fibrillation, peripheral vascular disease, status post aortofemoral bypass. Hospitalized 5/28 through 6/6 for sepsis. Dyspnea. EXAM: CT CHEST WITHOUT CONTRAST TECHNIQUE: Multidetector CT imaging of the chest was performed following the standard protocol without IV contrast. COMPARISON:  Chest CT dated 03/27/2016 FINDINGS: Mediastinum/Lymph Nodes: Scattered atherosclerotic changes of the normal-caliber thoracic aorta. Heart size is normal. No pericardial effusion. Coronary artery calcifications noted. Scattered small lymph nodes within the mediastinum, none of which are pathologic by CT size criteria. Esophagus appears normal. Trachea and central bronchi are unremarkable. Lungs/Pleura: Emphysematous changes bilaterally, upper lobe predominant. New irregular nodule within the superior segment of the right upper lobe, measuring 1.7 x 0.8 cm (series 3, image 63). Additional new nodular consolidation more inferiorly within the right lower lobe, measures approximately 1.8 x 0.7 cm, possibly atelectasis or confluent scarring (series 3, image  104). Confluent scarring/ fibrosis noted at each lung apex. Upper abdomen: Small stone within the gallbladder. No evidence of acute cholecystitis. Limited images of the upper abdomen are otherwise unremarkable. Musculoskeletal: Chronic-appearing compression fracture deformities noted within the mid thoracic spine, slightly progressed at the T6 vertebral body level since recent chest CT of 03/27/2016, not significantly changed at the T7 and T8 vertebral body levels. No acute-appearing osseous abnormality. IMPRESSION: 1. New irregular nodular density within the superior segment of the right upper lobe, measuring 1.7 x 0.8 x 0.8 cm, suspicious for neoplastic nodule. Consider PET-CT for further characterization. Recommend surgical consultation for further workup considerations. 2. Additional new ill-defined nodular consolidation more inferiorly within the right lower lobe, measuring approximately 1.8 x 0.7 x 2.1 cm. This may also represent neoplastic process but is less suspicious, perhaps atelectasis or confluent  scarring. PET-CT may be helpful for this finding as well. 3. Emphysema, upper lobe predominant. 4. Coronary artery calcifications, particularly dense within the left anterior descending coronary artery. Recommend correlation with any possible associated cardiac symptoms. 5. Cholelithiasis without evidence of acute cholecystitis. 6. Chronic-appearing compression fracture deformities of the T6 through T8 vertebral bodies. T6 compression has slightly progressed compared to the previous exam of 03/27/2016. T7 and T8 compression deformities appear stable. These results will be called to the ordering clinician or representative by the Radiologist Assistant, and communication documented in the PACS or zVision Dashboard. Electronically Signed   By: Franki Cabot M.D.   On: 04/22/2016 18:21   Ct Angio Chest Pe W/cm &/or Wo Cm  03/27/2016  CLINICAL DATA:  65 year old male with COPD with increasing shortness of  breath. EXAM: CT ANGIOGRAPHY CHEST WITH CONTRAST TECHNIQUE: Multidetector CT imaging of the chest was performed using the standard protocol during bolus administration of intravenous contrast. Multiplanar CT image reconstructions and MIPs were obtained to evaluate the vascular anatomy. CONTRAST:  100 cc Isovue 370 COMPARISON:  Chest radiograph dated 03/27/2016 FINDINGS: Evaluation of this exam is limited due to respiratory motion artifact. There is emphysematous changes of the lungs with biapical scarring. There is no focal consolidation, pleural effusion, or pneumothorax. The central airways are patent. There is mild atherosclerotic calcification of the thoracic aorta. No aneurysmal dilatation or dissection. The origins of the great vessels of the aortic arch appear patent. No CT evidence of pulmonary embolism. Evaluation however is limited due to respiratory motion artifact. There is no cardiomegaly or pericardial effusion. No hilar or mediastinal adenopathy. The esophagus and the thyroid gland are grossly unremarkable. There is no axillary adenopathy. The chest wall soft tissues appear unremarkable. There is osteopenia with degenerative changes of the spine. There is age indeterminate compression deformity of the T7 vertebra with approximately 50% loss of vertebral body height and anterior wedging. Clinical correlation is recommended. Mild age indeterminate compression deformities of the T8 and T9 vertebrae is also noted. No definite acute fracture identified. Review of the MIP images confirms the above findings. IMPRESSION: No CT evidence of pulmonary embolism. Emphysema.  No focal consolidation or pneumothorax. Age indeterminate compression fracture of the T7 vertebrae. Clinical correlation is recommended. Electronically Signed   By: Anner Crete M.D.   On: 03/27/2016 22:24   Ct Abdomen Pelvis W Contrast  04/07/2016  CLINICAL DATA:  Left flank pain. Lethargy, frequent falls. Elevated white blood cell  count. EXAM: CT ABDOMEN AND PELVIS WITH CONTRAST TECHNIQUE: Multidetector CT imaging of the abdomen and pelvis was performed using the standard protocol following bolus administration of intravenous contrast. CONTRAST:  125mL ISOVUE-300 IOPAMIDOL (ISOVUE-300) INJECTION 61% COMPARISON:  Chest CT 03/27/2016. Report from CT abdomen/ pelvis 12/26/2012, images not available. CT 03/03/2012 FINDINGS: Lower chest: Consolidation in the lingula with air bronchograms, new from chest CT 10 days prior. Small focus of tree in bud opacities in the right lower lobe is also new. Right middle lobe scarring is unchanged. Liver: No focal lesion. Hepatobiliary: Gallbladder distention but no calcified stone or pericholecystic inflammation. Common bile duct normal in caliber. Pancreas: No ductal dilatation or inflammation. Spleen: Normal in size, no focal lesion. Adrenal glands: No nodule. Kidneys: Symmetric renal enhancement and excretion. No hydronephrosis. No perinephric stranding or focal abnormality. Stomach/Bowel: Stomach physiologically distended. There are no dilated or thickened small bowel loops. Mild soft tissue stranding in the left pericolic gutter, there is a questionable colonic wall thickening and small diverticulum at the  junction of the descending and sigmoid colon best depicted on coronal image 53 series 602. This is in the region of the pericolonic fat stranding. No definite diverticular disease elsewhere. No pneumatosis or free air. Portions of the appendix are visualized and normal. There is no right-sided inflammation. Vascular/Lymphatic: No retroperitoneal adenopathy. Post abdominal aortobifemoral bypass graft no periaortic soft tissue stranding. The graft appears patent. Aortic dimensions at the proximal aspect of the graft 2.3 cm maximal dimension. No large vessel mesenteric occlusion. The IMA is not seen, likely excluded postsurgical. Celiac and superior mesenteric arteries are patent. Reproductive:   Brachytherapy seeds in the prostate gland. Bladder: Distended.  No bladder wall thickening. Other: No free air, free fluid, or intra-abdominal fluid collection. Musculoskeletal: There are no acute or suspicious osseous abnormalities. Avascular necrosis of both femoral heads. Question subchondral collapse on the right. IMPRESSION: 1. Soft tissue stranding in the left pericolic gutter. There short segment colonic wall thickening with possible small diverticulum at the junction of the descending and sigmoid colon a region of fat stranding. Differential considerations include mild acute diverticulitis, however no significant diverticular burden is seen elsewhere. Focal colitis is considered, with distribution suggesting ischemic etiology. 2. 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 3. Lingular consolidation is new from chest CT eleven days prior. Additional right lower lobe tree in bud opacities. Findings suspicious for infectious etiology/ pneumonia and bronchiolitis. Electronically Signed   By: Jeb Levering M.D.   On: 04/07/2016 01:51   Dg Chest Port 1 View  04/25/2016  CLINICAL DATA:  Aspiration in upper airway EXAM: PORTABLE CHEST 1 VIEW COMPARISON:  04/24/2016 FINDINGS: Cardiomediastinal silhouette is stable. Emphysematous changes again noted. Stable bilateral upper lobe medial scarring and some architectural distortion. Elongated nodular density superior segment of right lower lobe again noted. No infiltrate or pulmonary edema. Hyperinflation IMPRESSION: Emphysematous changes again noted. Stable bilateral upper lobe medial scarring and some architectural distortion. Elongated nodular density superior segment of right lower lobe again noted. No infiltrate or pulmonary edema. Hyperinflation Electronically Signed   By: Lahoma Crocker M.D.   On: 04/25/2016 09:53   Dg Chest Portable 1 View  04/24/2016  CLINICAL DATA:  Respiratory failure.  Shortness of breath. EXAM: PORTABLE CHEST 1 VIEW  COMPARISON:  04/23/2016.  CT 04/22/2016. FINDINGS: Mediastinum hilar structures normal. Heart size normal. Previously identified nodular density in the right mid lung is again best identified by CT. No pleural effusion or pneumothorax. No acute bony abnormality. IMPRESSION: 1. No acute cardiopulmonary disease. 2. Previously identified small right mid lung nodular density is again best identified by prior CT of 04/22/2016. Reference is made to that report. Electronically Signed   By: Marcello Moores  Register   On: 04/24/2016 07:37   Dg Chest Port 1 View  04/23/2016  CLINICAL DATA:  Shortness of breath with cough and congestion EXAM: PORTABLE CHEST 1 VIEW COMPARISON:  Chest radiograph April 21, 2016 and chest CT April 22, 2016 FINDINGS: There is scarring in each upper lobe and right mid lung region, stable. Lungs overall appear mildly hyperexpanded. There is no frank edema or consolidation. There is a the focal opacity in the periphery of the right mid lung measuring 1.2 x 1.0 cm which is felt to correspond to the lesions seen on CT 1 day prior. This lesion is better seen by CT. Heart size and pulmonary vascularity are within normal limits. No adenopathy evident. IMPRESSION: Nodular opacity in the periphery the right mid lung, better seen on CT 1 day prior. Areas  of scarring, primarily in the upper lobes near the apices. Lungs hyperexpanded. No edema or consolidation. Stable cardiac silhouette. Electronically Signed   By: Lowella Grip III M.D.   On: 04/23/2016 07:30   Dg Chest Portable 1 View  04/21/2016  CLINICAL DATA:  Dyspnea EXAM: PORTABLE CHEST 1 VIEW COMPARISON:  04/13/2016 FINDINGS: Cardiac shadow is stable. Mild interstitial changes are noted somewhat accentuated by the portable technique. No focal confluent infiltrate or sizable effusion is noted. No bony abnormality is seen. IMPRESSION: No change from the previous exam. Electronically Signed   By: Inez Catalina M.D.   On: 04/21/2016 16:46   Dg Chest Port 1  View  04/13/2016  CLINICAL DATA:  Dyspnea for 1 hour EXAM: PORTABLE CHEST 1 VIEW COMPARISON:  04/13/2016 FINDINGS: Advanced emphysema and biapical scarring. No evidence of acute superimposed pneumonia or edema. No effusion or pneumothorax (linear lucency at both bases attributed of Mach effect). Normal heart size and mediastinal contours. IMPRESSION: Emphysema and lung scarring without acute superimposed finding. Electronically Signed   By: Monte Fantasia M.D.   On: 04/13/2016 22:49   Dg Chest Port 1 View  04/11/2016  CLINICAL DATA:  Short of breath EXAM: PORTABLE CHEST 1 VIEW COMPARISON:  04/11/2016 FINDINGS: COPD with pulmonary hyperinflation apical scarring is unchanged. No superimposed infiltrate or effusion. Negative for mass or adenopathy. IMPRESSION: COPD, stable.  No superimposed acute abnormality. Electronically Signed   By: Franchot Gallo M.D.   On: 04/11/2016 08:08   Dg Chest Port 1 View  04/10/2016  CLINICAL DATA:  Shortness of Breath EXAM: PORTABLE CHEST 1 VIEW COMPARISON:  04/07/2016 FINDINGS: Cardiac shadow is stable. The lungs are well aerated bilaterally. No focal infiltrate or sizable effusion is seen. No acute bony abnormality is noted. IMPRESSION: No acute abnormality noted. Electronically Signed   By: Inez Catalina M.D.   On: 04/10/2016 08:09   Dg Chest Port 1 View  04/07/2016  CLINICAL DATA:  Worsening shortness of breath. EXAM: PORTABLE CHEST 1 VIEW COMPARISON:  Radiograph yesterday at 1832 hours. Chest CT 03/27/2016 FINDINGS: Diminished vascular congestion from prior exam. Improved left basilar aeration with minimal residual patchy opacity. The lingular opacity on CT is not well seen. Hyperinflation and emphysema. Biapical and mid lung zone scarring again seen. Cardiomediastinal contours are unchanged. IMPRESSION: Decreasing pulmonary vascular congestion. Lingular opacity on CT is not well seen. Minimal patchy left basilar opacity, with mild improvement from prior radiograph.  Electronically Signed   By: Jeb Levering M.D.   On: 04/07/2016 05:55   Dg Chest Portable 1 View  04/06/2016  CLINICAL DATA:  Status post fall, with worsening shortness of breath. Syncope and lower extremity weakness. Initial encounter. EXAM: PORTABLE CHEST 1 VIEW COMPARISON:  Chest radiograph performed 04/02/2016 FINDINGS: The lungs are well-aerated. Vascular congestion is noted. Mild left basilar airspace opacity could reflect pneumonia. Mild right midlung and right apical scarring is noted. There is no evidence of pleural effusion or pneumothorax. The cardiomediastinal silhouette is within normal limits. No acute osseous abnormalities are seen. IMPRESSION: Vascular congestion noted. Mild left basilar opacity could reflect pneumonia. Mild right midlung and right apical scarring noted. Electronically Signed   By: Garald Balding M.D.   On: 04/06/2016 19:00   Dg Chest Port 1 View  04/02/2016  CLINICAL DATA:  Shortness of breath, emphysema, COPD EXAM: PORTABLE CHEST 1 VIEW COMPARISON:  03/31/2016 FINDINGS: The heart size and mediastinal contours are within normal limits. Both lungs are clear. The visualized skeletal structures are unremarkable.  IMPRESSION: No active disease. Electronically Signed   By: Kathreen Devoid   On: 04/02/2016 13:14   Dg Abd Acute W/chest  04/13/2016  CLINICAL DATA:  Diarrhea, nausea, shortness of breath. Increased weakness for 1 day. EXAM: DG ABDOMEN ACUTE W/ 1V CHEST COMPARISON:  04/11/2016 FINDINGS: Gas within mildly prominent colon and nondilated small bowel loops. No free air organomegaly. Mild hyperinflation of the lungs compatible with COPD. Heart is normal size. Biapical scarring. No confluent opacities or effusions. No acute bony abnormality. IMPRESSION: No evidence of bowel obstruction or free air. COPD/chronic changes.  No active disease. Electronically Signed   By: Rolm Baptise M.D.   On: 04/13/2016 10:35   Dg Abd Portable 1v  04/10/2016  CLINICAL DATA:  Abdominal  distension. EXAM: PORTABLE ABDOMEN - 1 VIEW COMPARISON:  CT, 04/07/2016 FINDINGS: There is increased gas in both the colon and small bowel with no significant bowel dilation. Some air is seen within the rectum. There are multiple surgical vascular clips along the abdominal midline. Vascular calcifications are noted along the aorta and iliac vessels. IMPRESSION: 1. No convincing bowel obstruction. 2. Increased gas noted within the small bowel and colon suggesting a mild diffuse adynamic ileus. Electronically Signed   By: Lajean Manes M.D.   On: 04/10/2016 14:13   Dg Swallowing Func-speech Pathology  04/24/2016  Objective Swallowing Evaluation: Type of Study: MBS-Modified Barium Swallow Study Patient Details Name: GIANMARCO SETHER MRN: [id number] Date of Birth: [birth year] Today's Date: 04/24/2016 Time: SLP Start Time (ACUTE ONLY): 1149-SLP Stop Time (ACUTE ONLY): 1209 SLP Time Calculation (min) (ACUTE ONLY): 20 min Past Medical History: Past Medical History Diagnosis Date . GERD (gastroesophageal reflux disease)  . COPD (chronic obstructive pulmonary disease) ([place])    severe by PFTs August 2010 . 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 Atrial fibrillation ([facility])    post-op a-fib. 3/11. post Aortobifem , /  Atrial fibrillation from nebulizer treatment  May, 2012, while hospitalized . Renal artery stenosis ([place])     presumably corrected at time of aortobifem surgery March, 2011 . Tobacco user     stop smoking . Pneumothorax     2011  before aortobifemoral surgery,  resolved . Peripheral vascular disease, unspecified ([place])    bilateral intermittent claudication . CAD (coronary artery disease), native coronary artery    catheterization 06/2009.Marland KitchenMarland Kitchen70% proximal LAD and 30% proximal RCA. normal LV function, no intervention planned prior to surgery for aorta. LV normal function  by catheter 8/10.  [name] Dyslipidemia  . DJD (degenerative joint  disease)  . Ejection fraction    60%, echo, May, 2012, rapid atrial fib at the time . Cancer [facility]) 2013   Prostate Radiation . Myocardial infarction ([place]) 2011 . On home O2    2L N/C . Chronic thoracic back pain  . Chronic pain syndrome  Past Surgical History: Past Surgical History Procedure Laterality Date . Abdominal aortic aneurysm repair  01/11/2010 . Pr vein bypass graft,aorto-fem-pop   . Inguinal hernia repair     RIGHT HPI: Patient with a long history of respiratory issues and reports new onset of difficulty swallowing Subjective: pt says he has trouble swallowing his pills Assessment / Plan / Recommendation CHL IP CLINICAL IMPRESSIONS 04/24/2016 Therapy Diagnosis Mild oral phase dysphagia;Mild pharyngeal phase dysphagia Clinical Impression Pt has a mild oropharyngeal dysphagia with no aspiration observed during this study. He has brief oral  holding with thin liquids and prolonged mastication of solids, likely due to lack of dentition. He does have difficulty posteriorly transiting a halved barium tablet (broken at his request), which leads to premature spillage of thin liquid that is subsequently penetrated. This small amount of penetration occurs silently, but is cleared with a cued throat clear. Recommend Dys 2 diet and thin liquids, with no mixed consistencies.  Impact on safety and function Mild aspiration risk   CHL IP TREATMENT RECOMMENDATION 04/24/2016 Treatment Recommendations Therapy as outlined in treatment plan below   Prognosis 04/24/2016 Prognosis for Safe Diet Advancement Fair Barriers to Reach Goals -- Barriers/Prognosis Comment -- CHL IP DIET RECOMMENDATION 04/24/2016 SLP Diet Recommendations Dysphagia 2 (Fine chop) solids;Thin liquid Liquid Administration via Cup;No straw Medication Administration Whole meds with puree Compensations Slow rate;Small sips/bites;Clear throat intermittently Postural Changes Remain semi-upright after after feeds/meals (Comment);Seated upright at 90 degrees   CHL IP  OTHER RECOMMENDATIONS 04/24/2016 Recommended Consults -- Oral Care Recommendations Oral care BID Other Recommendations --   CHL IP FOLLOW UP RECOMMENDATIONS 04/24/2016 Follow up Recommendations (No Data)   CHL IP FREQUENCY AND DURATION 04/24/2016 Speech Therapy Frequency (ACUTE ONLY) min 2x/week Treatment Duration 2 weeks      CHL IP ORAL PHASE 04/24/2016 Oral Phase Impaired Oral - Pudding Teaspoon -- Oral - Pudding Cup -- Oral - Honey Teaspoon -- Oral - Honey Cup -- Oral - Nectar Teaspoon -- Oral - Nectar Cup -- Oral - Nectar Straw -- Oral - Thin Teaspoon -- Oral - Thin Cup Holding of bolus Oral - Thin Straw Holding of bolus;Premature spillage Oral - Puree WFL Oral - Mech Soft Impaired mastication Oral - Regular -- Oral - Multi-Consistency -- Oral - Pill Delayed oral transit Oral Phase - Comment --  CHL IP PHARYNGEAL PHASE 04/24/2016 Pharyngeal Phase Impaired Pharyngeal- Pudding Teaspoon -- Pharyngeal -- Pharyngeal- Pudding Cup -- Pharyngeal -- Pharyngeal- Honey Teaspoon -- Pharyngeal -- Pharyngeal- Honey Cup -- Pharyngeal -- Pharyngeal- Nectar Teaspoon -- Pharyngeal -- Pharyngeal- Nectar Cup -- Pharyngeal -- Pharyngeal- Nectar Straw -- Pharyngeal -- Pharyngeal- Thin Teaspoon -- Pharyngeal -- Pharyngeal- Thin Cup WFL Pharyngeal -- Pharyngeal- Thin Straw Penetration/Aspiration before swallow Pharyngeal Material enters airway, remains ABOVE vocal cords and not ejected out Pharyngeal- Puree WFL Pharyngeal -- Pharyngeal- Mechanical Soft WFL Pharyngeal -- Pharyngeal- Regular -- Pharyngeal -- Pharyngeal- Multi-consistency -- Pharyngeal -- Pharyngeal- Pill WFL Pharyngeal -- Pharyngeal Comment --  CHL IP CERVICAL ESOPHAGEAL PHASE 04/24/2016 Cervical Esophageal Phase WFL Pudding Teaspoon -- Pudding Cup -- Honey Teaspoon -- Honey Cup -- Nectar Teaspoon -- Nectar Cup -- Nectar Straw -- Thin Teaspoon -- Thin Cup -- Thin Straw -- Puree -- Mechanical Soft -- Regular -- Multi-consistency -- Pill -- Cervical Esophageal Comment -- No  flowsheet data found. Germain Osgood, M.A. CCC-SLP [phone] Germain Osgood 04/24/2016, 2:07 PM               PHYSICAL EXAM    Affect appropriate Desheveled male  HEENT: normal Neck supple with no adenopathy JVP normal no bruits no thyromegaly Lungs Rhonchi exp  wheezing and good diaphragmatic motion Heart:  S1/S2 no murmur, no rub, gallop or click PMI normal Abdomen: benighn, BS positve, no tenderness, no AAA no bruit.  No HSM or HJR Distal pulses intact with no bruits No edema Neuro non-focal Skin warm and dry No muscular weakness   TELEMETRY: NSR 04/26/2016     ASSESSMENT AND PLAN:    Atrial fibrillation with RVR (HCC)   PAF (paroxysmal atrial fibrillation) ([place])  Converted to NSR see note JK beta blocker stopped due to lung dx on cardizem and eliquis now    Pulmonary:  Oxygen dependent COPD continue antibiotics, steroids and nebulzer.     Dysphagia:  Nurse to getting swallowing to see again Barrium swallow with no aspiration and reported   Mild oral and pharyngeal dysphagia but clinically worse  Jenkins Rouge 04/26/2016 10:55 AM

## 2016-04-27 LAB — COMPREHENSIVE METABOLIC PANEL
ALBUMIN: 2.2 g/dL — AB (ref 3.5–5.0)
ALT: 30 U/L (ref 17–63)
ANION GAP: 6 (ref 5–15)
AST: 26 U/L (ref 15–41)
Alkaline Phosphatase: 98 U/L (ref 38–126)
BUN: 6 mg/dL (ref 6–20)
CHLORIDE: 102 mmol/L (ref 101–111)
CO2: 34 mmol/L — AB (ref 22–32)
CREATININE: 0.45 mg/dL — AB (ref 0.61–1.24)
Calcium: 8.5 mg/dL — ABNORMAL LOW (ref 8.9–10.3)
GFR calc non Af Amer: >60 mL/min (ref >60)
Glucose, Bld: 194 mg/dL — ABNORMAL HIGH (ref 65–99)
Potassium: 4.1 mmol/L (ref 3.5–5.1)
SODIUM: 142 mmol/L (ref 135–145)
Total Bilirubin: 0.8 mg/dL (ref 0.3–1.2)
Total Protein: 5 g/dL — ABNORMAL LOW (ref 6.5–8.1)

## 2016-04-27 LAB — CBC
HCT: 29.1 % — ABNORMAL LOW (ref 39.0–52.0)
Hemoglobin: 8.6 g/dL — ABNORMAL LOW (ref 13.0–17.0)
MCH: 26.7 pg (ref 26.0–34.0)
MCHC: 29.6 g/dL — ABNORMAL LOW (ref 30.0–36.0)
MCV: 90.4 fL (ref 78.0–100.0)
PLATELETS: 215 10*3/uL (ref 150–400)
RBC: 3.22 MIL/uL — AB (ref 4.22–5.81)
RDW: 15.4 % (ref 11.5–15.5)
WBC: 8.4 10*3/uL (ref 4.0–10.5)

## 2016-04-27 LAB — GLUCOSE, CAPILLARY
Glucose-Capillary: 176 mg/dL — ABNORMAL HIGH (ref 65–99)
Glucose-Capillary: 212 mg/dL — ABNORMAL HIGH (ref 65–99)
Glucose-Capillary: 244 mg/dL — ABNORMAL HIGH (ref 65–99)
Glucose-Capillary: 243 mg/dL — ABNORMAL HIGH (ref 65–99)

## 2016-04-27 MED ORDER — ALPRAZOLAM 0.25 MG PO TABS
0.2500 mg | ORAL_TABLET | Freq: Three times a day (TID) | ORAL | Status: DC | PRN
  Administered 2016-04-27 – 2016-05-01 (×8): 0.25 mg via ORAL
  Filled 2016-04-27 (×9): qty 1

## 2016-04-27 MED ORDER — LEVALBUTEROL HCL 1.25 MG/0.5ML IN NEBU
1.2500 mg | INHALATION_SOLUTION | RESPIRATORY_TRACT | Status: DC
  Administered 2016-04-27 – 2016-04-29 (×14): 1.25 mg via RESPIRATORY_TRACT
  Filled 2016-04-27 (×13): qty 0.5

## 2016-04-27 MED ORDER — IPRATROPIUM BROMIDE 0.02 % IN SOLN
0.5000 mg | RESPIRATORY_TRACT | Status: DC
  Administered 2016-04-27 – 2016-04-29 (×13): 0.5 mg via RESPIRATORY_TRACT
  Filled 2016-04-27 (×13): qty 2.5

## 2016-04-27 MED ORDER — LEVALBUTEROL HCL 1.25 MG/0.5ML IN NEBU
1.2500 mg | INHALATION_SOLUTION | RESPIRATORY_TRACT | Status: DC
  Filled 2016-04-27: qty 0.5

## 2016-04-27 MED ORDER — PREDNISONE 20 MG PO TABS
60.0000 mg | ORAL_TABLET | Freq: Every day | ORAL | Status: DC
  Administered 2016-04-28 – 2016-04-30 (×3): 60 mg via ORAL
  Filled 2016-04-27 (×3): qty 3

## 2016-04-27 MED ORDER — LEVALBUTEROL HCL 1.25 MG/0.5ML IN NEBU
1.2500 mg | INHALATION_SOLUTION | RESPIRATORY_TRACT | Status: DC | PRN
  Administered 2016-04-27 – 2016-05-01 (×7): 1.25 mg via RESPIRATORY_TRACT
  Filled 2016-04-27 (×6): qty 0.5

## 2016-04-27 MED ORDER — PREDNISONE 20 MG PO TABS: 40.0000 mg | ORAL_TABLET | Freq: Every day | ORAL | Status: DC

## 2016-04-27 MED ORDER — IPRATROPIUM BROMIDE 0.02 % IN SOLN
0.5000 mg | RESPIRATORY_TRACT | Status: DC
Start: 2016-04-27 — End: 2016-04-27
  Administered 2016-04-27: 0.5 mg via RESPIRATORY_TRACT
  Filled 2016-04-27: qty 2.5

## 2016-04-27 NOTE — Progress Notes (Signed)
Patient is noted to be able to only speak at a whisper. Patient states he has laryngitis. Pt denies sore throat at this time. Patient was educated on fall and safety plan. Patient refuses to have bed alarm on. [facility] [employer]

## 2016-04-27 NOTE — Progress Notes (Addendum)
Patient ID: Kenneth Merritt, male   DOB: 1951/11/06, 65 y.o.   MRN: [id number]    PROGRESS NOTE    Kenneth Merritt  [id number] DOB: Mar 27, 1951 DOA: 04/21/2016  PCP: Kenneth Burly, MD   Brief Narrative:  Pt is 65 y.o. male with known chronic atrial fibrillation on Eliquis, hyperlipidemia, peripheral vascular disease status post aorta femoral bypass, recent hospitalization from 04/06/16 - 04/15/2016 for sepsis secondary to diverticulitis, presented to [facility] ED with main concern of progressive weakness, fatigue, exertional dyspnea as well as some dyspnea at rest, mid area chest discomfort that occurs with coughing spells, poor oral intake.  In ED, pt noted to be in a-fib with RVR and HR up to 120's, SBP as low as 80's, blood work notable for WBC 12 K, Hg 9.9, K 3.3, troponins 0.07 --> 0.06 --> 0.06, pt admitted to [facility] unit by [facility]. Pt started on Cardizem drip.   Major events since admission: 6/13 - pt is a-fib, on cardizem drip, cardiology consulted  6/14 - pt more lethargic, still in A-fib, Converted to NSR, PCCM consulted  6/16 - started on long-acting diltiazem by cardiology  Assessment & Plan:   Principal Problem:   Atrial fibrillation with RVR, CHADS2 score 4 ([place]) - atrial arryhthmias rate in 80-100s - continue Apixaban  - appreciate cardiology team following  - transferred to telemetry 6/17. - started long-acting diltiazem (cardizem 120 mg) daily on 6/16.  Active Problems:   Acute metabolic encephalopathy - secondary to acute COPD and a-fib with RVR - now resolved    Elevated troponins - flat pattern   - cardio following - no chest pain symptoms     Acute hypoxic respiratory failure, Acute exacerbation of COPD (chronic obstructive pulmonary disease), ?Aspiration - respiratory status slowly improving - still on oxygen via [place], wean as tolerated - continue solumedrol today but start oral prednisone 6/19.  - continue BD's scheduled and as needed, started oral augmentin 6/16.   - CT  chest with RUL nodule, ? Neoplastic nodule with additional new ill-defined nodular consolidation right lower lobe - PCCM assistance appreciated - will need to follow up with pulmonary outpatient.  - Completing course of augmentin for aspiration pneumonia  Peripheral vascular disease, unspecified ([place]) CAD (coronary artery disease), native coronary artery - Pt not having chest pain symptoms    Hypokalemia and hypomagnesemia - repleted    Hyponatremia - resolved    Chronic diastolic CHF, grade I - per last 2 D ECHO 03/2016 - on lasix at home but here on hold per cardiology team  - weight trend since admission: 62 kg --> 60 kg  - monitor daily weights, strict I/O, down 3.6L total to date    Anemia of acute illness - drop in Hg since admission but no signs of active bleeding - Hemoglobin holding stable at 8.6    Hyperglycemia - A1C 6.6 - keep on SSI for now, blood sugars stable, controlled    Protein calorie malnutrition, severe  - nutritionist consulted, assistance and recommendations appreciated   DVT prophylaxis: Apixaban Code Status: Full Family Communication: Patient at bedside Disposition Plan: telemetry  Consultants:   Cardiology   PCCM  Procedures:   MBS   Antimicrobials:   Unasyn 6/14 -->6/16  Augmentin 6/16   Subjective: No acute changes but says that he is feeling a little better.    Objective: Filed Vitals:   04/26/16 2117 04/27/16 0248 04/27/16 0545 04/27/16 0806  BP: 133/90  128/74   Pulse: 59  105   Temp: 97.9 F (36.6 C)  97.8 F (36.6 C)   TempSrc: Oral  Oral   Resp: 22  22   Height:      Weight:      SpO2: 98% 100% 92% 94%    Intake/Output Summary (Last 24 hours) at 04/27/16 0846 Last data filed at 04/26/16 2156  Gross per 24 hour  Intake    480 ml  Output    551 ml  Net    -71 ml   Filed Weights   04/24/16 0400 04/25/16 0400 04/26/16 0500  Weight: 133 lb 6.1 oz (60.5 kg) 136 lb 14.5 oz (62.1 kg) 133 lb 3.2 oz (60.419 kg)     Examination:  General exam: Appears more alert, NAD Respiratory system: no significant changes.  Cardiovascular system: RRR. No rubs, gallops or clicks.  Gastrointestinal system: Abdomen is nondistended, soft and nontender.  Central nervous system: A&O x 3, moving all 4 extremities again gravity  Extremities: no cyanosis or clubbing. Skin: No rashes, lesions or ulcers  Data Reviewed: I have personally reviewed following labs and imaging studies  CBC:  Recent Labs Lab 04/21/16 1807 04/22/16 0212 04/23/16 0233 04/25/16 0300 04/26/16 0320 04/27/16 0248  WBC 12.1* 8.8 6.7 9.3 10.0 8.4  NEUTROABS 9.7*  --   --   --   --   --   HGB 9.9* 9.1* 8.6* 8.0* 8.9* 8.6*  HCT 30.8* 29.9* 28.1* 26.7* 29.4* 29.1*  MCV 87.0 88.2 88.4 89.6 89.4 90.4  PLT 207 199 199 213 264 [id number]   Basic Metabolic Panel:  Recent Labs Lab 04/21/16 1807 04/22/16 0212 04/23/16 0233 04/23/16 0751 04/24/16 0253 04/25/16 0300 04/26/16 0320 04/27/16 0248  NA 132* 134* 136  --   --  141 140 142  K 3.3* 3.1* 3.2*  --   --  4.1 4.9 4.1  CL 92* 94* 95*  --   --  101 101 102  CO2 29 33* 35*  --   --  32 35* 34*  GLUCOSE 137* 142* 114*  --   --  164* 190* 194*  BUN 12 6 <5*  --   --  <5* 8 6  CREATININE 0.98 0.76 0.47*  --   --  0.46* 0.50* 0.45*  CALCIUM 7.7* 7.8* 7.6*  --   --  8.0* 8.7* 8.5*  MG 1.5*  --   --  1.6* 2.1  --   --   --    Cardiac Enzymes:  Recent Labs Lab 04/21/16 1807 04/21/16 2223 04/22/16 0212  TROPONINI 0.07* 0.06* 0.05*   CBG:  Recent Labs Lab 04/26/16 0819 04/26/16 1155 04/26/16 1628 04/26/16 2122 04/27/16 0732  GLUCAP 195* 215* 180* 223* 176*   Urine analysis:    Component Value Date/Time   COLORURINE AMBER* 04/06/2016 1942   APPEARANCEUR CLOUDY* 04/06/2016 1942   LABSPEC 1.021 04/06/2016 1942   PHURINE 5.0 04/06/2016 1942   GLUCOSEU 250* 04/06/2016 1942   HGBUR MODERATE* 04/06/2016 1942   BILIRUBINUR MODERATE* 04/06/2016 1942   KETONESUR 40* 04/06/2016 1942    PROTEINUR NEGATIVE 04/06/2016 1942   UROBILINOGEN 0.2 01/21/2010 1944   NITRITE NEGATIVE 04/06/2016 1942   LEUKOCYTESUR NEGATIVE 04/06/2016 1942   Recent Results (from the past 240 hour(s))  MRSA PCR Screening     Status: None   Collection Time: 04/21/16 11:00 PM  Result Value Ref Range Status   MRSA by PCR NEGATIVE NEGATIVE Final    Radiology Studies: Dg Chest Portable 1 View  04/21/2016  No change from the previous exam.  Ct Chest Wo Contrast 04/22/2016 New irregular nodular density within the superior segment of the right upper lobe, measuring 1.7 x 0.8 x 0.8 cm, suspicious for neoplastic nodule. Consider PET-CT for further characterization. Recommend surgical consultation for further workup considerations. 2. Additional new ill-defined nodular consolidation more inferiorly within the right lower lobe, measuring approximately 1.8 x 0.7 x 2.1 cm. This may also represent neoplastic process but is less suspicious, perhaps atelectasis or confluent scarring. PET-CT may be helpful for this finding as well. 3. Emphysema, upper lobe predominant. 4. Coronary artery calcifications, particularly dense within the left anterior descending coronary artery. Recommend correlation with any possible associated cardiac symptoms. 5. Cholelithiasis without evidence of acute cholecystitis. 6. Chronic-appearing compression fracture deformities of the T6 through T8 vertebral bodies. T6 compression has slightly progressed compared to the previous exam of 03/27/2016. T7 and T8 compression deformities appear stable. These results will be called to the ordering clinician or representative by the Radiologist Assistant, and communication documented in the PACS or zVision Dashboard.  Dg Chest Port 1 View 04/23/2016  Nodular opacity in the periphery the right mid lung, better seen on CT 1 day prior. Areas of scarring, primarily in the upper lobes near the apices. Lungs hyperexpanded. No edema or consolidation. Stable cardiac  silhouette.    Scheduled Meds: . amoxicillin-clavulanate  1 tablet Oral BID  . apixaban  5 mg Oral BID  . atorvastatin  20 mg Oral q1800  . diltiazem  120 mg Oral Daily  . feeding supplement (ENSURE ENLIVE)  237 mL Oral BID BM  . gabapentin  300 mg Oral TID  . insulin aspart  0-15 Units Subcutaneous TID WC  . ipratropium  0.5 mg Nebulization Q6H WA  . levalbuterol  1.25 mg Nebulization Q6H WA  . methylPREDNISolone (SOLU-MEDROL) injection  40 mg Intravenous Q12H  . multivitamin with minerals  1 tablet Oral Daily  . pantoprazole  40 mg Oral Daily  . polyethylene glycol  17 g Oral Daily   Continuous Infusions:    LOS: 6 days   Time spent: 15 minutes   Irwin Brakeman, MD [facility] Pager [phone]  If 7PM-7AM, please contact night-coverage [email or web address] Password [facility] 04/27/2016, 8:46 AM

## 2016-04-27 NOTE — Progress Notes (Signed)
Patient Name: Kenneth Merritt Date of Encounter: 04/27/2016  Principal Problem:   Atrial fibrillation with RVR ([place]) Active Problems:   GERD (gastroesophageal reflux disease)   COPD (chronic obstructive pulmonary disease) (HCC)   Peripheral vascular disease, unspecified (HCC)   Dyslipidemia   S/P aortobifemoral bypass surgery   Tobacco user   CAD (coronary artery disease), native coronary artery   PAF (paroxysmal atrial fibrillation) (HCC)   HX: anticoagulation   Chronic respiratory failure ([place])   COPD with acute exacerbation (HCC)   Hyponatremia   Noninfectious gastroenteritis and colitis   Protein-calorie malnutrition, severe   Altered mental status   Dyspnea   Primary Cardiologist: Dr Ron Parker >> Dr Gwenlyn Found Patient Profile: 65 y/o male from [place] with a history of CAD-70% LAD in 2010, Nl LVF by echo May 2017, severe COPD, and PAF. Discharged 04/15/16 after admit for sepsis, colitis. Admitted 06/12 w/ SOB, diarrhea, weakness. Card saw 06/14 for rapid afib.  SUBJECTIVE: Feels a little better, no chest pain  OBJECTIVE Filed Vitals:   04/26/16 2117 04/27/16 0248 04/27/16 0545 04/27/16 0806  BP: 133/90  128/74   Pulse: 59  105   Temp: 97.9 F (36.6 C)  97.8 F (36.6 C)   TempSrc: Oral  Oral   Resp: 22  22   Height:      Weight:      SpO2: 98% 100% 92% 94%    Intake/Output Summary (Last 24 hours) at 04/27/16 0817 Last data filed at 04/26/16 2156  Gross per 24 hour  Intake    480 ml  Output    551 ml  Net    -71 ml   Filed Weights   04/24/16 0400 04/25/16 0400 04/26/16 0500  Weight: 133 lb 6.1 oz (60.5 kg) 136 lb 14.5 oz (62.1 kg) 133 lb 3.2 oz (60.419 kg)    PHYSICAL EXAM General: Well developed, well nourished, male in no acute distress. Head: Normocephalic, atraumatic.  Neck: Supple without bruits, JVD not elevated. Lungs:  Resp regular and unlabored, slight exp wheeze, upper airway congestion. Heart: Rapid RRR, S1, S2, no S3, S4, or murmur; no  rub. Abdomen: Soft, non-tender, non-distended, BS + x 4.  Extremities: No clubbing, cyanosis, edema.  Neuro: Alert and oriented X 3. Moves all extremities spontaneously. Psych: Normal affect.  LABS: CBC: Recent Labs  04/26/16 0320 04/27/16 0248  WBC 10.0 8.4  HGB 8.9* 8.6*  HCT 29.4* 29.1*  MCV 89.4 90.4  PLT 264 [id number]   Basic Metabolic Panel: Recent Labs  04/26/16 0320 04/27/16 0248  NA 140 142  K 4.9 4.1  CL 101 102  CO2 35* 34*  GLUCOSE 190* 194*  BUN 8 6  CREATININE 0.50* 0.45*  CALCIUM 8.7* 8.5*   Liver Function Tests: Recent Labs  04/26/16 0320 04/27/16 0248  AST 25 26  ALT 30 30  ALKPHOS 96 98  BILITOT 0.5 0.8  PROT 5.1* 5.0*  ALBUMIN 2.1* 2.2*   BNP:  B NATRIURETIC PEPTIDE  Date/Time Value Ref Range Status  04/23/2016 02:59 PM 117.8* 0.0 - 100.0 pg/mL Final  04/06/2016 07:06 PM 140.0* 0.0 - 100.0 pg/mL Final   TELE:  Mostly ST w/ PACs, may be some afib/RVR (artifact makes it hard to tell)       Radiology/Studies: Dg Chest Port 1 View  04/25/2016  CLINICAL DATA:  Aspiration in upper airway EXAM: PORTABLE CHEST 1 VIEW COMPARISON:  04/24/2016 FINDINGS: Cardiomediastinal silhouette is stable. Emphysematous changes again noted. Stable bilateral upper  lobe medial scarring and some architectural distortion. Elongated nodular density superior segment of right lower lobe again noted. No infiltrate or pulmonary edema. Hyperinflation IMPRESSION: Emphysematous changes again noted. Stable bilateral upper lobe medial scarring and some architectural distortion. Elongated nodular density superior segment of right lower lobe again noted. No infiltrate or pulmonary edema. Hyperinflation Electronically Signed   By: Lahoma Crocker M.D.   On: 04/25/2016 09:53     Current Medications:  . amoxicillin-clavulanate  1 tablet Oral BID  . apixaban  5 mg Oral BID  . atorvastatin  20 mg Oral q1800  . diltiazem  120 mg Oral Daily  . feeding supplement (ENSURE ENLIVE)  237 mL Oral  BID BM  . gabapentin  300 mg Oral TID  . insulin aspart  0-15 Units Subcutaneous TID WC  . ipratropium  0.5 mg Nebulization Q6H WA  . levalbuterol  1.25 mg Nebulization Q6H WA  . methylPREDNISolone (SOLU-MEDROL) injection  40 mg Intravenous Q12H  . multivitamin with minerals  1 tablet Oral Daily  . pantoprazole  40 mg Oral Daily  . polyethylene glycol  17 g Oral Daily      ASSESSMENT AND PLAN:  Atrial fibrillation with RVR (HCC)  PAF (paroxysmal atrial fibrillation) (HCC) - Converted to NSR see note JK beta blocker stopped due to lung dx on cardizem and eliquis now - may be having some PAF, but HR elevated at baseline with PACs so hard to tell    Pulmonary: Oxygen dependent COPD continue antibiotics, steroids and nebulzer.    Dysphagia: Nurse to getting swallowing to see again Barrium swallow with no aspiration and reported  Mild oral and pharyngeal dysphagia but clinically worse  Otherwise, per IM Principal Problem:   Atrial fibrillation with RVR (HCC) Active Problems:   GERD (gastroesophageal reflux disease)   COPD (chronic obstructive pulmonary disease) (HCC)   Peripheral vascular disease, unspecified (HCC)   Dyslipidemia   S/P aortobifemoral bypass surgery   Tobacco user   CAD (coronary artery disease), native coronary artery   PAF (paroxysmal atrial fibrillation) (HCC)   HX: anticoagulation   Chronic respiratory failure ([place])   COPD with acute exacerbation (HCC)   Hyponatremia   Noninfectious gastroenteritis and colitis   Protein-calorie malnutrition, severe   Altered mental status   Dyspnea   Signed, Barrett, Rhonda , PA-C 8:17 AM 04/27/2016  Primary issue continues to be COPD with wheezing and swallowing issues. No frank aspiration on swallow Study. Maint NSR with PaC on telemetry.  Continue cardizem and eliquis beta blocker stopped by Dr Ron Parker For lung dx  Jenkins Rouge

## 2016-04-28 ENCOUNTER — Other Ambulatory Visit: Payer: Self-pay

## 2016-04-28 LAB — GLUCOSE, CAPILLARY
GLUCOSE-CAPILLARY: 206 mg/dL — AB (ref 65–99)
GLUCOSE-CAPILLARY: 172 mg/dL — AB (ref 65–99)
GLUCOSE-CAPILLARY: 191 mg/dL — AB (ref 65–99)
Glucose-Capillary: 162 mg/dL — ABNORMAL HIGH (ref 65–99)

## 2016-04-28 MED ORDER — DILTIAZEM HCL 100 MG IV SOLR
5.0000 mg/h | INTRAVENOUS | Status: DC
  Administered 2016-04-28: 5 mg/h via INTRAVENOUS
  Administered 2016-04-29: 15 mg/h via INTRAVENOUS
  Filled 2016-04-28 (×2): qty 100

## 2016-04-28 NOTE — Progress Notes (Signed)
Occupational Therapy Treatment Patient Details Name: Kenneth Merritt MRN: [id number] DOB: 06-May-1951 Today's Date: 04/28/2016    History of present illness pt is a 65 y.o. male wit PMHx of Afib, CAD, BPG, PVD, DJD and Prostate CA admitted with SOB, Afib with RVR, general weakness and diarrhea.   OT comments  Pt progressing. Continue to recommend [place]. Feel pt will continue to benefit from acute OT to increase independence and reinforce energy conservation prior to d/c.  Follow Up Recommendations  Home health OT;Supervision/Assistance - 24 hour    Equipment Recommendations  Tub/shower seat    Recommendations for Other Services      Precautions / Restrictions Precautions Precautions: Fall Precaution Comments: O2 dependent Restrictions Weight Bearing Restrictions: No       Mobility Bed Mobility   General bed mobility comments: pt sitting EOB when OT arrived.  Transfers Overall transfer level: Needs assistance Transfers: Sit to/from Stand Sit to Stand: Min guard         General transfer comment: RW in front of pt.    Balance Min guard given for ambulation with RW; Stood at sink and performed functional tasks without LOB.                   ADL Overall ADL's : Needs assistance/impaired     Grooming: Wash/dry face;Oral care;Applying deodorant;Set up;Supervision/safety;Standing   Upper Body Bathing: Supervision/ safety;Set up;Sitting Upper Body Bathing Details (indicate cue type and reason): washed armpits         Lower Body Dressing: Set up;Supervision/safety;Sitting/lateral leans Lower Body Dressing Details (indicate cue type and reason): donned/doffed socks Toilet Transfer:  (sit to stand from chair and bed)           Functional mobility during ADLs: Min guard;Rolling walker General ADL Comments: Educated on energy conservation.       Vision                     Perception     Praxis      Cognition  Awake/Alert Behavior During  Therapy: WFL for tasks assessed/performed;Flat affect Overall Cognitive Status: Within Functional Limits for tasks assessed                       Extremity/Trunk Assessment               Exercises     Shoulder Instructions       General Comments      Pertinent Vitals/ Pain       Pain Assessment: No/denies pain HR up to 130s in session, but went down.  Home Living                                          Prior Functioning/Environment              Frequency Min 2X/week     Progress Toward Goals  OT Goals(current goals can now be found in the care plan section)  Progress towards OT goals: Progressing toward goals  Acute Rehab OT Goals Patient Stated Goal: not stated OT Goal Formulation: With patient Time For Goal Achievement: 05/08/16 Potential to Achieve Goals: Good ADL Goals Pt Will Perform Grooming: with supervision;standing Pt Will Perform Upper Body Bathing: with set-up;sitting Pt Will Perform Lower Body Bathing: with supervision;sit to/from stand Pt Will Perform Upper Body Dressing: with set-up;sitting Pt  Will Perform Lower Body Dressing: with supervision;sit to/from stand Pt Will Transfer to Toilet: with supervision;ambulating;regular height toilet;grab bars Pt Will Perform Toileting - Clothing Manipulation and hygiene: with supervision;sit to/from stand Additional ADL Goal #1: Pt will independently incorporate energy conservation activities into ADL taks with min cues   Plan Discharge plan remains appropriate    Co-evaluation                 End of Session Equipment Utilized During Treatment: Oxygen;Rolling walker   Activity Tolerance Patient limited by fatigue   Patient Left in bed;with call bell/phone within reach;with family/visitor present   Nurse Communication Other (comment) (wants shower cap and razor; bed alarm not set)        Time: [id number] OT Time Calculation (min): 14 min  Charges: OT General  Charges $OT Visit: 1 Procedure OT Treatments $Self Care/Home Management : 8-22 mins  Benito Mccreedy OTR/L [id number] 04/28/2016, 2:53 PM

## 2016-04-28 NOTE — Care Management Important Message (Signed)
Important Message  Patient Details  Name: Kenneth Merritt MRN: [id number] Date of Birth: [birth year]   Medicare Important Message Given:  Yes    Nathen May 04/28/2016, 11:37 AM

## 2016-04-28 NOTE — Progress Notes (Signed)
K.Schorr placed order to contact cardiology regarding patient back into afib with rvr. Dr. Claiborne Billings paged regarding patient back into A-Fib RVR sustained with heart rates 150's to 170's. Pt having discomfort from heart rate increase, VSS. Await return call. Patient being monitored closely. No s/s acute resp distress. [facility] [employer]

## 2016-04-28 NOTE — Progress Notes (Signed)
Patient educated on fall and safety plan. He refuses bed alarm. [facility] [employer]

## 2016-04-28 NOTE — Progress Notes (Signed)
Patient ID: Kenneth Merritt, male   DOB: 1951-03-13, 65 y.o.   MRN: [id number]    PROGRESS NOTE    Kenneth Merritt  [id number] DOB: Feb 03, 1951 DOA: 04/21/2016  PCP: Neale Burly, MD   Brief Narrative:  Pt is 65 y.o. male with known chronic atrial fibrillation on Eliquis, hyperlipidemia, peripheral vascular disease status post aorta femoral bypass, recent hospitalization from 04/06/16 - 04/15/2016 for sepsis secondary to diverticulitis, presented to [facility] ED with main concern of progressive weakness, fatigue, exertional dyspnea as well as some dyspnea at rest, mid area chest discomfort that occurs with coughing spells, poor oral intake.  In ED, pt noted to be in a-fib with RVR and HR up to 120's, SBP as low as 80's, blood work notable for WBC 12 K, Hg 9.9, K 3.3, troponins 0.07 --> 0.06 --> 0.06, pt admitted to [facility] unit by [facility]. Pt started on Cardizem drip.   Major events since admission: 6/13 - pt is a-fib, on cardizem drip, cardiology consulted  6/14 - pt more lethargic, still in A-fib, Converted to NSR, PCCM consulted  6/16 - started on long-acting diltiazem by cardiology  Assessment & Plan:   1. Atrial fibrillation with RVR, CHADS2 score 4 ([place]). HR is sill not at goal. Continue diltiazem,  Apixaban.  appreciate cardiology team following   2. Acute metabolic encephalopathy.  secondary to acute COPD and a-fib with RVR - now resolved  3. Elevated troponins flat pattern,  cardio following,  no chest pain symptoms   4. Acute hypoxic respiratory failure, Acute exacerbation of COPD (chronic obstructive pulmonary disease), ?Aspiration. respiratory status slowly improving, still on oxygen via [place], wean as tolerated - CT chest with RUL nodule, ? Neoplastic nodule with additional new ill-defined nodular consolidation right lower lobe. PCCM assistance appreciated, will need to follow up with pulmonary outpatient.  -cont steroids, bronchodilators, completing course of augmentin for aspiration  pneumonia  5. Peripheral vascular disease, unspecified ([place]). CAD (coronary artery disease), native coronary artery - Pt not having chest pain symptoms 6. Hypokalemia and hypomagnesemia,  repleted 7. Hyponatremia resolved 8.  Chronic diastolic CHF, grade I. per last 2 D ECHO 03/2016, on lasix at home but here on hold per cardiology team. weight trend since admission: 62 kg --> 60 kg.  monitor daily weights, strict I/O 9.  Anemia of acute illness drop in Hg since admission but no signs of active bleeding - Hemoglobin holding stable at 8.6 10.  Protein calorie malnutrition, severe  nutritionist consulted, assistance and recommendations appreciated   DVT prophylaxis: Apixaban Code Status: Full Family Communication: Patient at bedside Disposition Plan home soon. 24-48 hrs   Consultants:   Cardiology   PCCM  Procedures:   MBS   Antimicrobials:   Unasyn 6/14 -->6/16  Augmentin 6/16   Subjective: No acute changes but says that he is feeling a little better.    Objective: Filed Vitals:   04/28/16 0334 04/28/16 0500 04/28/16 0635 04/28/16 0732  BP:  125/96    Pulse:  81    Temp:  97.8 F (36.6 C)    TempSrc:  Oral    Resp:  18    Height:      Weight:      SpO2: 98% 100% 98% 98%    Intake/Output Summary (Last 24 hours) at 04/28/16 1014 Last data filed at 04/28/16 0920  Gross per 24 hour  Intake    840 ml  Output    900 ml  Net    -  60 ml   Filed Weights   04/24/16 0400 04/25/16 0400 04/26/16 0500  Weight: 60.5 kg (133 lb 6.1 oz) 62.1 kg (136 lb 14.5 oz) 60.419 kg (133 lb 3.2 oz)    Examination:  General exam: Appears more alert, NAD Respiratory system: no significant changes.  Cardiovascular system: RRR. No rubs, gallops or clicks.  Gastrointestinal system: Abdomen is nondistended, soft and nontender.  Central nervous system: A&O x 3, moving all 4 extremities again gravity  Extremities: no cyanosis or clubbing. Skin: No rashes, lesions or ulcers  Data  Reviewed: I have personally reviewed following labs and imaging studies  CBC:  Recent Labs Lab 04/21/16 1807 04/22/16 0212 04/23/16 0233 04/25/16 0300 04/26/16 0320 04/27/16 0248  WBC 12.1* 8.8 6.7 9.3 10.0 8.4  NEUTROABS 9.7*  --   --   --   --   --   HGB 9.9* 9.1* 8.6* 8.0* 8.9* 8.6*  HCT 30.8* 29.9* 28.1* 26.7* 29.4* 29.1*  MCV 87.0 88.2 88.4 89.6 89.4 90.4  PLT 207 199 199 213 [id number]   Basic Metabolic Panel:  Recent Labs Lab 04/21/16 1807 04/22/16 0212 04/23/16 0233 04/23/16 0751 04/24/16 0253 04/25/16 0300 04/26/16 0320 04/27/16 0248  NA 132* 134* 136  --   --  141 140 142  K 3.3* 3.1* 3.2*  --   --  4.1 4.9 4.1  CL 92* 94* 95*  --   --  101 101 102  CO2 29 33* 35*  --   --  32 35* 34*  GLUCOSE 137* 142* 114*  --   --  164* 190* 194*  BUN 12 6 <5*  --   --  <5* 8 6  CREATININE 0.98 0.76 0.47*  --   --  0.46* 0.50* 0.45*  CALCIUM 7.7* 7.8* 7.6*  --   --  8.0* 8.7* 8.5*  MG 1.5*  --   --  1.6* 2.1  --   --   --    Cardiac Enzymes:  Recent Labs Lab 04/21/16 1807 04/21/16 2223 04/22/16 0212  TROPONINI 0.07* 0.06* 0.05*   CBG:  Recent Labs Lab 04/27/16 0732 04/27/16 1125 04/27/16 1621 04/27/16 2139 04/28/16 0754  GLUCAP 176* 212* 244* 243* 206*   Urine analysis:    Component Value Date/Time   COLORURINE AMBER* 04/06/2016 1942   APPEARANCEUR CLOUDY* 04/06/2016 1942   LABSPEC 1.021 04/06/2016 1942   PHURINE 5.0 04/06/2016 1942   GLUCOSEU 250* 04/06/2016 1942   HGBUR MODERATE* 04/06/2016 1942   BILIRUBINUR MODERATE* 04/06/2016 1942   KETONESUR 40* 04/06/2016 1942   PROTEINUR NEGATIVE 04/06/2016 1942   UROBILINOGEN 0.2 01/21/2010 1944   NITRITE NEGATIVE 04/06/2016 1942   LEUKOCYTESUR NEGATIVE 04/06/2016 1942   Recent Results (from the past 240 hour(s))  MRSA PCR Screening     Status: None   Collection Time: 04/21/16 11:00 PM  Result Value Ref Range Status   MRSA by PCR NEGATIVE NEGATIVE Final    Radiology Studies: Dg Chest Portable 1  View 04/21/2016  No change from the previous exam.  Ct Chest Wo Contrast 04/22/2016 New irregular nodular density within the superior segment of the right upper lobe, measuring 1.7 x 0.8 x 0.8 cm, suspicious for neoplastic nodule. Consider PET-CT for further characterization. Recommend surgical consultation for further workup considerations. 2. Additional new ill-defined nodular consolidation more inferiorly within the right lower lobe, measuring approximately 1.8 x 0.7 x 2.1 cm. This may also represent neoplastic process but is less suspicious, perhaps atelectasis or confluent  scarring. PET-CT may be helpful for this finding as well. 3. Emphysema, upper lobe predominant. 4. Coronary artery calcifications, particularly dense within the left anterior descending coronary artery. Recommend correlation with any possible associated cardiac symptoms. 5. Cholelithiasis without evidence of acute cholecystitis. 6. Chronic-appearing compression fracture deformities of the T6 through T8 vertebral bodies. T6 compression has slightly progressed compared to the previous exam of 03/27/2016. T7 and T8 compression deformities appear stable. These results will be called to the ordering clinician or representative by the Radiologist Assistant, and communication documented in the PACS or zVision Dashboard.  Dg Chest Port 1 View 04/23/2016  Nodular opacity in the periphery the right mid lung, better seen on CT 1 day prior. Areas of scarring, primarily in the upper lobes near the apices. Lungs hyperexpanded. No edema or consolidation. Stable cardiac silhouette.    Scheduled Meds: . amoxicillin-clavulanate  1 tablet Oral BID  . apixaban  5 mg Oral BID  . atorvastatin  20 mg Oral q1800  . diltiazem  120 mg Oral Daily  . feeding supplement (ENSURE ENLIVE)  237 mL Oral BID BM  . gabapentin  300 mg Oral TID  . insulin aspart  0-15 Units Subcutaneous TID WC  . ipratropium  0.5 mg Nebulization Q4H WA  . levalbuterol  1.25 mg  Nebulization Q4H WA  . multivitamin with minerals  1 tablet Oral Daily  . pantoprazole  40 mg Oral Daily  . polyethylene glycol  17 g Oral Daily  . predniSONE  60 mg Oral Q breakfast   Continuous Infusions:    LOS: 7 days   Time spent: 15 minutes   Kinnie Feil, MD [facility] Pager [phone]  If 7PM-7AM, please contact night-coverage [email or web address] Password [email or web address] 04/28/2016, 10:14 AM

## 2016-04-28 NOTE — Progress Notes (Signed)
Patient went into Afib RVR right after having his breathing treatment , He has sustained at 150-170 and not converting back. Patient c/o of discomfort with the high heart rate, no pain. EKG obtained. Lamar Blinks NP paged. [facility] [employer]

## 2016-04-28 NOTE — Progress Notes (Signed)
Physical Therapy Treatment Patient Details Name: Kenneth Merritt MRN: [id number] DOB: 1951/06/10 Today's Date: 04/28/2016    History of Present Illness pt is a 65 y/o male wit PMHx of Afib, CAD, BPG, PVD, DJD and Prostate CA admitted with SOB, Afib with RVR, general weakness and diarrhea.    PT Comments    Patient seen for mobility progression, tolerated increased ambulation distance this session but did required many rest breaks and cues for pursed lip breathing. Patient ambulated on 3 liters [place] with noted DOE. Returned to room, took approximately 3 minutes to rebound. Will continue to see and progress as tolerated.  Follow Up Recommendations  Home health PT;Supervision/Assistance - 24 hour     Equipment Recommendations  Rolling walker with 5" wheels (rollator)    Recommendations for Other Services       Precautions / Restrictions Precautions Precautions: Fall Precaution Comments: O2 dep (3 L at home) Restrictions Weight Bearing Restrictions: No    Mobility  Bed Mobility Overal bed mobility: Needs Assistance Bed Mobility: Supine to Sit     Supine to sit: Supervision     General bed mobility comments: increased time and effort no physical assist required  Transfers Overall transfer level: Needs assistance Equipment used: Rolling walker (2 wheeled) Transfers: Sit to/from Stand Sit to Stand: Min guard         General transfer comment: VCs for hand placement, min guard for stability  Ambulation/Gait Ambulation/Gait assistance: Min guard Ambulation Distance (Feet): 190 Feet Assistive device: Rolling walker (2 wheeled) Gait Pattern/deviations: Step-through pattern;Decreased stride length;Narrow base of support;Trunk flexed Gait velocity: slow gait speed, flexed over posture. 6 standing rest breaks with notable fatigue and DOE on 3 liters   General Gait Details: slow and unsteady gait   Stairs            Wheelchair Mobility    Modified Rankin (Stroke  Patients Only)       Balance Overall balance assessment: Needs assistance Sitting-balance support: No upper extremity supported Sitting balance-Leahy Scale: Fair     Standing balance support: Bilateral upper extremity supported Standing balance-Leahy Scale: Poor Standing balance comment: heavy reliance on RW                    Cognition Arousal/Alertness: Awake/alert Behavior During Therapy: WFL for tasks assessed/performed Overall Cognitive Status: Within Functional Limits for tasks assessed                      Exercises      General Comments        Pertinent Vitals/Pain Pain Assessment: Faces Faces Pain Scale: Hurts little more Pain Location: right flank region Pain Descriptors / Indicators: Sore Pain Intervention(s): Monitored during session    Home Living                      Prior Function            PT Goals (current goals can now be found in the care plan section) Acute Rehab PT Goals Patient Stated Goal: want to catch my breath, home PT Goal Formulation: With patient Time For Goal Achievement: 04/28/16 Potential to Achieve Goals: Good Progress towards PT goals: Progressing toward goals    Frequency  Min 3X/week    PT Plan Current plan remains appropriate    Co-evaluation             End of Session Equipment Utilized During Treatment: Oxygen (3 liters) Activity  Tolerance: Patient tolerated treatment well Patient left: in bed;with call bell/phone within reach (sitting EOB, nsg aware)     Time: [id number] PT Time Calculation (min) (ACUTE ONLY): 21 min  Charges:  $Gait Training: 8-22 mins                    G CodesDuncan Dull 05-16-2016, 1:53 PM Alben Deeds, [place] DPT  [phone]

## 2016-04-28 NOTE — Progress Notes (Signed)
Speech Language Pathology Treatment: Dysphagia  Patient Details Name: Kenneth Merritt MRN: [id number] DOB: 1951-07-27 Today's Date: 04/28/2016 Time: [id number] SLP Time Calculation (min) (ACUTE ONLY): 15 min  Assessment / Plan / Recommendation Clinical Impression  Skilled treatment session focused on addressing dysphagia goals.  Upon SLP arrival Respiratory Therapist present and reporting worse lung sounds as compared to this morning and patient with audible expiratory wheezes.  Patient appeared to demonstrate a lot of effort/work of breathing and declined PO.  Patient requested thin liquids and SLP re-educated on importance of small, single cup sips with a slow pace and intermittent throat clears and provided an external aid to assist to carryover.  RN made aware as well and discussed small meals as breathing allows.  Plan for acute SLP to follow patient closely and anticipate need for SLP services at next level of care upon discharge.      HPI HPI: Patient with a long history of respiratory issues and reports new onset of difficulty swallowing.       SLP Plan  Continue with current plan of care     Recommendations  Diet recommendations: Dysphagia 2 (fine chop);Thin liquid Liquids provided via: Cup;No straw Medication Administration: Crushed with puree Supervision: Patient able to self feed;Full supervision/cueing for compensatory strategies Compensations: Slow rate;Small sips/bites;Clear throat intermittently Postural Changes and/or Swallow Maneuvers: Seated upright 90 degrees             Oral Care Recommendations: Oral care BID Follow up Recommendations: 24 hour supervision/assistance;Skilled Nursing facility Plan: Continue with current plan of care     GO               Carmelia Roller., CCC-SLP [id number]  [place] 04/28/2016, 12:25 PM

## 2016-04-28 NOTE — Progress Notes (Signed)
RT instructed pt to deep breathe and cough post neb tx. Pt has a weak NPC. Pt refuses to be sxn at this time

## 2016-04-28 NOTE — Progress Notes (Signed)
Patient Name: NALEN GRESH Date of Encounter: 04/28/2016   Primary Cardiologist: Dr. Gwenlyn Found Patient Profile: Mr. Gunsallus is a 65 y/o male with a history of CAD-70% LAD in 2010, normal LVEF by echo May 2017, severe COPD, aortobifemoral bypass in 2011, and PAF. Discharged 04/15/16 after admit for sepsis, colitis. Admitted 04/21/16 w/ SOB, diarrhea, weakness and found to be in Afib RVR. He converted to NSR on IV diltiazem.   SUBJECTIVE: Feels ok, looks very weak. Denies chest pain and palpitations.    OBJECTIVE Filed Vitals:   04/28/16 0334 04/28/16 0500 04/28/16 0635 04/28/16 0732  BP:  125/96    Pulse:  81    Temp:  97.8 F (36.6 C)    TempSrc:  Oral    Resp:  18    Height:      Weight:      SpO2: 98% 100% 98% 98%    Intake/Output Summary (Last 24 hours) at 04/28/16 0841 Last data filed at 04/28/16 0503  Gross per 24 hour  Intake    840 ml  Output    750 ml  Net     90 ml   Filed Weights   04/24/16 0400 04/25/16 0400 04/26/16 0500  Weight: 133 lb 6.1 oz (60.5 kg) 136 lb 14.5 oz (62.1 kg) 133 lb 3.2 oz (60.419 kg)    PHYSICAL EXAM General: Well developed, well nourished, ill appearing male in no acute distress. Head: Normocephalic, atraumatic.  Neck: Supple without bruits, no JVD. Lungs:  Resp regular and unlabored, diminished throughout.  Heart: RRR, S1, S2, no S3, S4, or murmur; no rub. Abdomen: Soft, non-tender, non-distended, BS + x 4.  Extremities: No clubbing, cyanosis, no edema.  Neuro: Alert and oriented X 3. Moves all extremities spontaneously. Psych: Normal affect.  LABS: CBC: Recent Labs  04/26/16 0320 04/27/16 0248  WBC 10.0 8.4  HGB 8.9* 8.6*  HCT 29.4* 29.1*  MCV 89.4 90.4  PLT 264 [id number]   Basic Metabolic Panel: Recent Labs  04/26/16 0320 04/27/16 0248  NA 140 142  K 4.9 4.1  CL 101 102  CO2 35* 34*  GLUCOSE 190* 194*  BUN 8 6  CREATININE 0.50* 0.45*  CALCIUM 8.7* 8.5*   Liver Function Tests: Recent Labs  04/26/16 0320  04/27/16 0248  AST 25 26  ALT 30 30  ALKPHOS 96 98  BILITOT 0.5 0.8  PROT 5.1* 5.0*  ALBUMIN 2.1* 2.2*   BNP:  B NATRIURETIC PEPTIDE  Date/Time Value Ref Range Status  04/23/2016 02:59 PM 117.8* 0.0 - 100.0 pg/mL Final  04/06/2016 07:06 PM 140.0* 0.0 - 100.0 pg/mL Final     Current facility-administered medications:  .  acetaminophen (TYLENOL) tablet 650 mg, 650 mg, Oral, Q4H PRN, Elwin Mocha, MD, 650 mg at 04/24/16 1821 .  ALPRAZolam [name]) tablet 0.25 mg, 0.25 mg, Oral, TID PRN, Clanford Marisa Hua, MD, 0.25 mg at 04/28/16 0503 .  amoxicillin-clavulanate (AUGMENTIN) 875-125 MG per tablet 1 tablet, 1 tablet, Oral, BID, Erick Colace, NP, 1 tablet at 04/28/16 0837 .  apixaban (ELIQUIS) tablet 5 mg, 5 mg, Oral, BID, Elwin Mocha, MD, 5 mg at 04/28/16 0837 .  atorvastatin (LIPITOR) tablet 20 mg, 20 mg, Oral, q1800, Jake Church Masters, RPH, 20 mg at 04/27/16 1658 .  bisacodyl (DULCOLAX) suppository 10 mg, 10 mg, Rectal, Daily PRN, Clanford Marisa Hua, MD, 10 mg at 04/25/16 1112 .  diltiazem (CARDIZEM CD) 24 hr capsule 120 mg, 120 mg, Oral, Daily,  [name], MD, 120 mg at 04/28/16 [phone] .  feeding supplement (ENSURE ENLIVE) (ENSURE ENLIVE) liquid 237 mL, 237 mL, Oral, BID BM, Clanford L Johnson, MD, 237 mL at 04/27/16 1400 .  food thickener (THICK IT) powder, , Oral, PRN, Theodis Blaze, MD .  gabapentin (NEURONTIN) capsule 300 mg, 300 mg, Oral, TID, Elwin Mocha, MD, 300 mg at 04/28/16 [phone] .  guaiFENesin-dextromethorphan (ROBITUSSIN DM) 100-10 MG/5ML syrup 5 mL, 5 mL, Oral, Q4H PRN, Theodis Blaze, MD, 5 mL at 04/26/16 0435 .  HYDROcodone-acetaminophen (NORCO/VICODIN) 5-325 MG per tablet 1-2 tablet, 1-2 tablet, Oral, Q4H PRN, Theodis Blaze, MD, 1 tablet at 04/26/16 0947 .  insulin aspart (novoLOG) injection 0-15 Units, 0-15 Units, Subcutaneous, TID WC, Elwin Mocha, MD, 5 Units at 04/28/16 [phone] .  ipratropium (ATROVENT) nebulizer solution 0.5 mg, 0.5 mg, Nebulization, Q4H  WA, Clanford L Johnson, MD, 0.5 mg at 04/28/16 0732 .  levalbuterol (XOPENEX) nebulizer solution 1.25 mg, 1.25 mg, Nebulization, Q2H PRN, Clanford Marisa Hua, MD, 1.25 mg at 04/28/16 0634 .  levalbuterol (XOPENEX) nebulizer solution 1.25 mg, 1.25 mg, Nebulization, Q4H WA, Clanford L Johnson, MD, 1.25 mg at 04/28/16 0732 .  multivitamin with minerals tablet 1 tablet, 1 tablet, Oral, Daily, Theodis Blaze, MD, 1 tablet at 04/28/16 [phone] .  nitroGLYCERIN (NITROSTAT) SL tablet 0.4 mg, 0.4 mg, Sublingual, Q5 Min x 3 PRN, Elwin Mocha, MD .  ondansetron [facility]) injection 4 mg, 4 mg, Intravenous, Q6H PRN, Elwin Mocha, MD .  pantoprazole (PROTONIX) EC tablet 40 mg, 40 mg, Oral, Daily, Elwin Mocha, MD, 40 mg at 04/28/16 0837 .  polyethylene glycol (MIRALAX / GLYCOLAX) packet 17 g, 17 g, Oral, Daily, Clanford L Johnson, MD, 17 g at 04/27/16 0857 .  predniSONE (DELTASONE) tablet 60 mg, 60 mg, Oral, Q breakfast, Clanford Marisa Hua, MD, 60 mg at 04/28/16 0837    TELE: NSR with frequent PAC's         Current Medications:  . amoxicillin-clavulanate  1 tablet Oral BID  . apixaban  5 mg Oral BID  . atorvastatin  20 mg Oral q1800  . diltiazem  120 mg Oral Daily  . feeding supplement (ENSURE ENLIVE)  237 mL Oral BID BM  . gabapentin  300 mg Oral TID  . insulin aspart  0-15 Units Subcutaneous TID WC  . ipratropium  0.5 mg Nebulization Q4H WA  . levalbuterol  1.25 mg Nebulization Q4H WA  . multivitamin with minerals  1 tablet Oral Daily  . pantoprazole  40 mg Oral Daily  . polyethylene glycol  17 g Oral Daily  . predniSONE  60 mg Oral Q breakfast      ASSESSMENT AND PLAN: Principal Problem:   Atrial fibrillation with RVR (HCC) Active Problems:   GERD (gastroesophageal reflux disease)   COPD (chronic obstructive pulmonary disease) (HCC)   Peripheral vascular disease, unspecified (HCC)   Dyslipidemia   S/P aortobifemoral bypass surgery   Tobacco user   CAD (coronary artery disease), native  coronary artery   PAF (paroxysmal atrial fibrillation) (HCC)   HX: anticoagulation   Chronic respiratory failure (HCC)   COPD with acute exacerbation (HCC)   Hyponatremia   Noninfectious gastroenteritis and colitis   Protein-calorie malnutrition, severe   Altered mental status   Dyspnea  1. Atrial fibrillation with RVR: Patient was working with PT at home and developed increased weakness and lower extremity edema. EMS was called and patient was in Afib with  rate of 120-150. He has a history of Afib. He is on Eliquis, metoprolol and digoxin at home. Digoxin level was low at < 0.2, he was given additional dose but Digoxin has been discontinued. Metoprolol discontinued in setting of severe COPD. Remains on po Diltiazem and Eliquis. GFR is >60, Cr is 0.45  This patients CHA2DS2-VASc Score and unadjusted Ischemic Stroke Rate (% per year) is equal to 2.2 % stroke rate/year from a score of 2 Above score calculated as 1 point each if present [CHF, HTN, DM, Vascular=MI/PAD/Aortic Plaque, Age if 65-74, or Male], 2 points each if present [Age > 75, or Stroke/TIA/TE]  Last echo was one year ago, EF was 55-60%. His respiratory status is contributory to his Afib.   2. COPD: Seen by PCCM this admission. Getting empiric coverage for questionable aspiration PNA. Also has RUL nodule that Pulmonary plans to follow up on outpatient.   3. HLD: On statin. No lipid panel for review. Can order outpatient.        Signed, Arbutus Leas , NP 8:41 AM 04/28/2016 Pager [phone]  I have examined the patient and reviewed assessment and plan and discussed with patient.  Agree with above as stated.  Appears to be in MAT at this point. Continue rate control meds.   Larae Grooms

## 2016-04-29 ENCOUNTER — Other Ambulatory Visit: Payer: Self-pay

## 2016-04-29 LAB — GLUCOSE, CAPILLARY
GLUCOSE-CAPILLARY: 151 mg/dL — AB (ref 65–99)
Glucose-Capillary: 174 mg/dL — ABNORMAL HIGH (ref 65–99)
Glucose-Capillary: 209 mg/dL — ABNORMAL HIGH (ref 65–99)
Glucose-Capillary: 181 mg/dL — ABNORMAL HIGH (ref 65–99)

## 2016-04-29 MED ORDER — LEVALBUTEROL HCL 1.25 MG/0.5ML IN NEBU
1.2500 mg | INHALATION_SOLUTION | Freq: Three times a day (TID) | RESPIRATORY_TRACT | Status: DC
  Administered 2016-04-30 – 2016-05-01 (×4): 1.25 mg via RESPIRATORY_TRACT
  Filled 2016-04-29 (×4): qty 0.5

## 2016-04-29 MED ORDER — IPRATROPIUM BROMIDE 0.02 % IN SOLN
0.5000 mg | Freq: Three times a day (TID) | RESPIRATORY_TRACT | Status: DC
Start: 2016-04-30 — End: 2016-05-01
  Administered 2016-04-30 – 2016-05-01 (×4): 0.5 mg via RESPIRATORY_TRACT
  Filled 2016-04-29 (×4): qty 2.5

## 2016-04-29 MED ORDER — DIGOXIN 125 MCG PO TABS
0.1250 mg | ORAL_TABLET | Freq: Once | ORAL | Status: AC
  Administered 2016-04-29: 0.125 mg via ORAL
  Filled 2016-04-29: qty 1

## 2016-04-29 MED ORDER — DILTIAZEM HCL 60 MG PO TABS
60.0000 mg | ORAL_TABLET | Freq: Four times a day (QID) | ORAL | Status: DC
  Administered 2016-04-29 – 2016-04-30 (×5): 60 mg via ORAL
  Filled 2016-04-29 (×5): qty 1

## 2016-04-29 NOTE — Progress Notes (Signed)
Patient Name: Kenneth Merritt Date of Encounter: 04/29/2016  Primary Cardiologist: Dr. Gwenlyn Found Patient Profile: Mr. Hoak is a 65 y/o male with a history of CAD-70% LAD in 2010, normal LVEF by echo May 2017, severe COPD, aortobifemoral bypass in 2011, and PAF. Discharged 04/15/16 after admit for sepsis, colitis. Admitted 04/21/16 w/ SOB, diarrhea, weakness and found to be in Afib RVR.    SUBJECTIVE: says he feels ok this morning, denies chest pain. Denies palpitations.   OBJECTIVE Filed Vitals:   04/29/16 0502 04/29/16 0736 04/29/16 0800 04/29/16 0844  BP: 109/74 100/63 105/68   Pulse: 86     Temp: 97.4 F (36.3 C)     TempSrc: Oral     Resp: 14  12   Height:      Weight: 128 lb 11.2 oz (58.378 kg)     SpO2: 100%   99%    Intake/Output Summary (Last 24 hours) at 04/29/16 0945 Last data filed at 04/29/16 0859  Gross per 24 hour  Intake    840 ml  Output    750 ml  Net     90 ml   Filed Weights   04/25/16 0400 04/26/16 0500 04/29/16 0502  Weight: 136 lb 14.5 oz (62.1 kg) 133 lb 3.2 oz (60.419 kg) 128 lb 11.2 oz (58.378 kg)    PHYSICAL EXAM General: Well developed, well nourished, ill appearing male in no acute distress. Head: Normocephalic, atraumatic.  Neck: Supple without bruits, no JVD. Lungs:  Resp regular and labored. Diffuse rhonchi, diminished in bases.  Heart: RRR, S1, S2, no S3, S4, or murmur; no rub. Abdomen: Soft, non-tender, non-distended, BS + x 4.  Extremities: No clubbing, cyanosis, no edema.  Neuro: Alert and oriented X 3. Moves all extremities spontaneously. Psych: Normal affect.  LABS: CBC: Recent Labs  04/27/16 0248  WBC 8.4  HGB 8.6*  HCT 29.1*  MCV 90.4  PLT 123456   Basic Metabolic Panel: Recent Labs  04/27/16 0248  NA 142  K 4.1  CL 102  CO2 34*  GLUCOSE 194*  BUN 6  CREATININE 0.45*  CALCIUM 8.5*   Liver Function Tests: Recent Labs  04/27/16 0248  AST 26  ALT 30  ALKPHOS 98  BILITOT 0.8  PROT 5.0*  ALBUMIN 2.2*    BNP:  B NATRIURETIC PEPTIDE  Date/Time Value Ref Range Status  04/23/2016 02:59 PM 117.8* 0.0 - 100.0 pg/mL Final  04/06/2016 07:06 PM 140.0* 0.0 - 100.0 pg/mL Final     Current facility-administered medications:  .  acetaminophen (TYLENOL) tablet 650 mg, 650 mg, Oral, Q4H PRN, Elwin Mocha, MD, 650 mg at 04/24/16 1821 .  ALPRAZolam [name]) tablet 0.25 mg, 0.25 mg, Oral, TID PRN, Clanford Marisa Hua, MD, 0.25 mg at 04/29/16 0022 .  amoxicillin-clavulanate (AUGMENTIN) 875-125 MG per tablet 1 tablet, 1 tablet, Oral, BID, Erick Colace, NP, 1 tablet at 04/29/16 0830 .  apixaban (ELIQUIS) tablet 5 mg, 5 mg, Oral, BID, Elwin Mocha, MD, 5 mg at 04/29/16 0830 .  atorvastatin (LIPITOR) tablet 20 mg, 20 mg, Oral, q1800, Jake Church Masters, RPH, 20 mg at 04/28/16 1829 .  bisacodyl (DULCOLAX) suppository 10 mg, 10 mg, Rectal, Daily PRN, Clanford Marisa Hua, MD, 10 mg at 04/25/16 1112 .  diltiazem (CARDIZEM) 100 mg in dextrose 5 % 100 mL (1 mg/mL) infusion, 5-15 mg/hr, Intravenous, Titrated, Troy Sine, MD, Last Rate: 10 mL/hr at 04/29/16 0736, 10 mg/hr at 04/29/16 0736 .  feeding  supplement (ENSURE ENLIVE) (ENSURE ENLIVE) liquid 237 mL, 237 mL, Oral, BID BM, Clanford L Johnson, MD, 237 mL at 04/27/16 1400 .  food thickener (THICK IT) powder, , Oral, PRN, Theodis Blaze, MD .  gabapentin (NEURONTIN) capsule 300 mg, 300 mg, Oral, TID, Elwin Mocha, MD, 300 mg at 04/29/16 0830 .  guaiFENesin-dextromethorphan (ROBITUSSIN DM) 100-10 MG/5ML syrup 5 mL, 5 mL, Oral, Q4H PRN, Theodis Blaze, MD, 5 mL at 04/26/16 0435 .  HYDROcodone-acetaminophen (NORCO/VICODIN) 5-325 MG per tablet 1-2 tablet, 1-2 tablet, Oral, Q4H PRN, Theodis Blaze, MD, 2 tablet at 04/28/16 2111 .  insulin aspart (novoLOG) injection 0-15 Units, 0-15 Units, Subcutaneous, TID WC, Elwin Mocha, MD, 3 Units at 04/29/16 0831 .  ipratropium (ATROVENT) nebulizer solution 0.5 mg, 0.5 mg, Nebulization, Q4H WA, Clanford Marisa Hua, MD, 0.5  mg at 04/29/16 0842 .  levalbuterol (XOPENEX) nebulizer solution 1.25 mg, 1.25 mg, Nebulization, Q2H PRN, Clanford Marisa Hua, MD, 1.25 mg at 04/28/16 0634 .  levalbuterol (XOPENEX) nebulizer solution 1.25 mg, 1.25 mg, Nebulization, Q4H WA, Clanford Marisa Hua, MD, 1.25 mg at 04/29/16 0842 .  multivitamin with minerals tablet 1 tablet, 1 tablet, Oral, Daily, Theodis Blaze, MD, 1 tablet at 04/29/16 0830 .  nitroGLYCERIN (NITROSTAT) SL tablet 0.4 mg, 0.4 mg, Sublingual, Q5 Min x 3 PRN, Elwin Mocha, MD .  ondansetron [facility]) injection 4 mg, 4 mg, Intravenous, Q6H PRN, Elwin Mocha, MD .  pantoprazole (PROTONIX) EC tablet 40 mg, 40 mg, Oral, Daily, Elwin Mocha, MD, 40 mg at 04/29/16 1000 .  polyethylene glycol (MIRALAX / GLYCOLAX) packet 17 g, 17 g, Oral, Daily, Clanford L Johnson, MD, 17 g at 04/27/16 0857 .  predniSONE (DELTASONE) tablet 60 mg, 60 mg, Oral, Q breakfast, Clanford L Johnson, MD, 60 mg at 04/29/16 0830 . diltiazem (CARDIZEM) infusion 10 mg/hr (04/29/16 0736)    TELE: NSR          Current Medications:  . amoxicillin-clavulanate  1 tablet Oral BID  . apixaban  5 mg Oral BID  . atorvastatin  20 mg Oral q1800  . feeding supplement (ENSURE ENLIVE)  237 mL Oral BID BM  . gabapentin  300 mg Oral TID  . insulin aspart  0-15 Units Subcutaneous TID WC  . ipratropium  0.5 mg Nebulization Q4H WA  . levalbuterol  1.25 mg Nebulization Q4H WA  . multivitamin with minerals  1 tablet Oral Daily  . pantoprazole  40 mg Oral Daily  . polyethylene glycol  17 g Oral Daily  . predniSONE  60 mg Oral Q breakfast   . diltiazem (CARDIZEM) infusion 10 mg/hr (04/29/16 0736)    ASSESSMENT AND PLAN: Principal Problem:   Atrial fibrillation with RVR (HCC) Active Problems:   GERD (gastroesophageal reflux disease)   COPD (chronic obstructive pulmonary disease) (HCC)   Peripheral vascular disease, unspecified (HCC)   Dyslipidemia   S/P aortobifemoral bypass surgery   Tobacco user   CAD  (coronary artery disease), native coronary artery   PAF (paroxysmal atrial fibrillation) (HCC)   HX: anticoagulation   Chronic respiratory failure (HCC)   COPD with acute exacerbation (HCC)   Hyponatremia   Noninfectious gastroenteritis and colitis   Protein-calorie malnutrition, severe   Altered mental status   Dyspnea  1. Atrial fibrillation with RVR: Developed Afib with RVR with rates in 150's last night following a breathing treatment. He was started back on diltiazem gtt. And given 0.125 mg po Digoxin last night and  converted back to NSR shortly after. He was on digoxin at home, MD to advise restarting maintenance dosing.   This patients CHA2DS2-VASc Score and unadjusted Ischemic Stroke Rate (% per year) is equal to 2.2 % stroke rate/year from a score of 2 Above score calculated as 1 point each if present [CHF, HTN, DM, Vascular=MI/PAD/Aortic Plaque, Age if 65-74, or Male], 2 points each if present [Age > 75, or Stroke/TIA/TE]  Last echo was one year ago, EF was 55-60%. His respiratory status is contributory to his Afib.   2. COPD: Seen by PCCM this admission. Getting empiric coverage for questionable aspiration PNA. Also has RUL nodule that Pulmonary plans to follow up on outpatient.   3. HLD: On statin. No lipid panel for review. Can order outpatient.     Signed, Arbutus Leas , NP 9:45 AM 04/29/2016 Pager [phone]  I have examined the patient and reviewed assessment and plan and discussed with patient.  Agree with above as stated.  Back in NSR.  Increase Cardizem to 60 mg q 6 hours.    Larae Grooms

## 2016-04-29 NOTE — Progress Notes (Signed)
Patient heart rate remains sustained at 150-170's , cardizem gtt at max of 31mcg. Dr. Claiborne Billings paged for further instructions. B/P 102/58. Pt requested xanax and is resting comfortably with no s/s resp distress or c/o pain. [facility] [employer]

## 2016-04-29 NOTE — Progress Notes (Signed)
Patient ID: Kenneth Merritt, male   DOB: 30-Dec-1950, 65 y.o.   MRN: [id number]    PROGRESS NOTE    Kenneth Merritt  [id number] DOB: 09/01/1951 DOA: 04/21/2016  PCP: Kenneth Burly, MD   Brief Narrative:  Pt is 65 y.o. male with known chronic atrial fibrillation on Eliquis, hyperlipidemia, peripheral vascular disease status post aorta femoral bypass, recent hospitalization from 04/06/16 - 04/15/2016 for sepsis secondary to diverticulitis, presented to [facility] ED with main concern of progressive weakness, fatigue, exertional dyspnea as well as some dyspnea at rest, mid area chest discomfort that occurs with coughing spells, poor oral intake.  In ED, pt noted to be in a-fib with RVR and HR up to 120's, SBP as low as 80's, blood work notable for WBC 12 K, Hg 9.9, K 3.3, troponins 0.07 --> 0.06 --> 0.06, pt admitted to [facility] unit by [facility]. Pt started on Cardizem drip.   Major events since admission: 6/13 - pt is a-fib, on cardizem drip, cardiology consulted  6/14 - pt more lethargic, still in A-fib, Converted to NSR, PCCM consulted  6/16 - started on long-acting diltiazem by cardiology  Assessment & Plan:   1. Atrial fibrillation with RVR, CHADS2 score 4 ([place]). HR is sill not at goal. Likely due to advanced COPD.  -Still requiring IV diltiazem,  Apixaban.  appreciate cardiology team following   2. Acute metabolic encephalopathy.  secondary to acute COPD and a-fib with RVR. now resolved  3. Elevated troponins flat pattern,  cardio following,  no chest pain symptoms   4. Acute hypoxic respiratory failure, Acute exacerbation of COPD (chronic obstructive pulmonary disease), ?Aspiration. respiratory status slowly improving, still on oxygen via [place], wean as tolerated - CT chest with RUL nodule, ? Neoplastic nodule with additional new ill-defined nodular consolidation right lower lobe. PCCM assistance appreciated, will need to follow up with pulmonary outpatient.  -patient is still symptomatic, we will cont  steroids, bronchodilators, and augmentin for aspiration pneumonia  5. Peripheral vascular disease, unspecified ([place]). CAD (coronary artery disease), native coronary artery - Pt not having chest pain symptoms 6. Hypokalemia and hypomagnesemia,  repleted 7. Hyponatremia resolved 8. Chronic diastolic CHF, grade I. per last 2 D ECHO 03/2016, on lasix at home but here on hold per cardiology team. weight trend since admission: 62 kg --> 60 kg.  monitor daily weights, strict I/O 9.  Anemia of acute illness drop in Hg since admission but no signs of active bleeding. Hemoglobin holding stable at 8.6 10.  Protein calorie malnutrition, severe  nutritionist consulted, assistance and recommendations appreciated   DVT prophylaxis: Apixaban Code Status: Full Family Communication: Patient at bedside Disposition Plan home when stable   Consultants:   Cardiology   PCCM  Procedures:   MBS   Antimicrobials:   Unasyn 6/14 -->6/16  Augmentin 6/16>>  Subjective: No acute changes but says that he is feeling a little better.    Objective: Filed Vitals:   04/29/16 0502 04/29/16 0736 04/29/16 0800 04/29/16 0844  BP: 109/74 100/63 105/68   Pulse: 86     Temp: 97.4 F (36.3 C)     TempSrc: Oral     Resp: 14  12   Height:      Weight: 58.378 kg (128 lb 11.2 oz)     SpO2: 100%   99%    Intake/Output Summary (Last 24 hours) at 04/29/16 0931 Last data filed at 04/29/16 0859  Gross per 24 hour  Intake    840 ml  Output    750 ml  Net     90 ml   Filed Weights   04/25/16 0400 04/26/16 0500 04/29/16 0502  Weight: 62.1 kg (136 lb 14.5 oz) 60.419 kg (133 lb 3.2 oz) 58.378 kg (128 lb 11.2 oz)    Examination:  General exam: Appears more alert, NAD. Still dyspneic at baseline Respiratory system: poor ventilation .  Cardiovascular system: RRR. No rubs, gallops or clicks.  Gastrointestinal system: Abdomen is nondistended, soft and nontender.  Central nervous system: A&O x 3, moving all 4  extremities again gravity  Extremities: no cyanosis + clubbing. Skin: No rashes, lesions or ulcers  Data Reviewed: I have personally reviewed following labs and imaging studies  CBC:  Recent Labs Lab 04/23/16 0233 04/25/16 0300 04/26/16 0320 04/27/16 0248  WBC 6.7 9.3 10.0 8.4  HGB 8.6* 8.0* 8.9* 8.6*  HCT 28.1* 26.7* 29.4* 29.1*  MCV 88.4 89.6 89.4 90.4  PLT 199 213 264 [id number]   Basic Metabolic Panel:  Recent Labs Lab 04/23/16 0233 04/23/16 0751 04/24/16 0253 04/25/16 0300 04/26/16 0320 04/27/16 0248  NA 136  --   --  141 140 142  K 3.2*  --   --  4.1 4.9 4.1  CL 95*  --   --  101 101 102  CO2 35*  --   --  32 35* 34*  GLUCOSE 114*  --   --  164* 190* 194*  BUN <5*  --   --  <5* 8 6  CREATININE 0.47*  --   --  0.46* 0.50* 0.45*  CALCIUM 7.6*  --   --  8.0* 8.7* 8.5*  MG  --  1.6* 2.1  --   --   --    Cardiac Enzymes: No results for input(s): CKTOTAL, CKMB, CKMBINDEX, TROPONINI in the last 168 hours. CBG:  Recent Labs Lab 04/28/16 0754 04/28/16 1151 04/28/16 1630 04/28/16 2041 04/29/16 0742  GLUCAP 206* 172* 191* 162* 151*   Urine analysis:    Component Value Date/Time   COLORURINE AMBER* 04/06/2016 1942   APPEARANCEUR CLOUDY* 04/06/2016 1942   LABSPEC 1.021 04/06/2016 1942   PHURINE 5.0 04/06/2016 1942   GLUCOSEU 250* 04/06/2016 1942   HGBUR MODERATE* 04/06/2016 1942   BILIRUBINUR MODERATE* 04/06/2016 1942   KETONESUR 40* 04/06/2016 1942   PROTEINUR NEGATIVE 04/06/2016 1942   UROBILINOGEN 0.2 01/21/2010 1944   NITRITE NEGATIVE 04/06/2016 1942   LEUKOCYTESUR NEGATIVE 04/06/2016 1942   Recent Results (from the past 240 hour(s))  MRSA PCR Screening     Status: None   Collection Time: 04/21/16 11:00 PM  Result Value Ref Range Status   MRSA by PCR NEGATIVE NEGATIVE Final    Radiology Studies: Dg Chest Portable 1 View 04/21/2016  No change from the previous exam.  Ct Chest Wo Contrast 04/22/2016 New irregular nodular density within the superior  segment of the right upper lobe, measuring 1.7 x 0.8 x 0.8 cm, suspicious for neoplastic nodule. Consider PET-CT for further characterization. Recommend surgical consultation for further workup considerations. 2. Additional new ill-defined nodular consolidation more inferiorly within the right lower lobe, measuring approximately 1.8 x 0.7 x 2.1 cm. This may also represent neoplastic process but is less suspicious, perhaps atelectasis or confluent scarring. PET-CT may be helpful for this finding as well. 3. Emphysema, upper lobe predominant. 4. Coronary artery calcifications, particularly dense within the left anterior descending coronary artery. Recommend correlation with any possible associated cardiac symptoms. 5. Cholelithiasis without evidence of acute cholecystitis. 6.  Chronic-appearing compression fracture deformities of the T6 through T8 vertebral bodies. T6 compression has slightly progressed compared to the previous exam of 03/27/2016. T7 and T8 compression deformities appear stable. These results will be called to the ordering clinician or representative by the Radiologist Assistant, and communication documented in the PACS or zVision Dashboard.  Dg Chest Port 1 View 04/23/2016  Nodular opacity in the periphery the right mid lung, better seen on CT 1 day prior. Areas of scarring, primarily in the upper lobes near the apices. Lungs hyperexpanded. No edema or consolidation. Stable cardiac silhouette.    Scheduled Meds: . amoxicillin-clavulanate  1 tablet Oral BID  . apixaban  5 mg Oral BID  . atorvastatin  20 mg Oral q1800  . feeding supplement (ENSURE ENLIVE)  237 mL Oral BID BM  . gabapentin  300 mg Oral TID  . insulin aspart  0-15 Units Subcutaneous TID WC  . ipratropium  0.5 mg Nebulization Q4H WA  . levalbuterol  1.25 mg Nebulization Q4H WA  . multivitamin with minerals  1 tablet Oral Daily  . pantoprazole  40 mg Oral Daily  . polyethylene glycol  17 g Oral Daily  . predniSONE  60 mg  Oral Q breakfast   Continuous Infusions: . diltiazem (CARDIZEM) infusion 10 mg/hr (04/29/16 0736)    LOS: 8 days   Time spent: 15 minutes   Kinnie Feil, MD [facility] Pager [phone]  If 7PM-7AM, please contact night-coverage [email or web address] Password [email or web address] 04/29/2016, 9:31 AM

## 2016-04-29 NOTE — Care Management Note (Addendum)
Case Management Note  Patient Details  Name: Kenneth Merritt MRN: [id number] Date of Birth: 14-Sep-1951  Subjective/Objective:  Pt admitted for A fib with RVR. Initiated on Cardizem gtt and transitioned to po. Now back on Cardizem gtt. Pt is from home with wife and son. Pt is active with [facility] for PT, OT, SW and RN. Pt wants to continue the Services with [facility]. Pt has 02 at 3L and RW at home. [facility] to follow up as well post d/c.                  Action/Plan: CM will continue to monitor for additional disposition needs.   Expected Discharge Date:   (unknown)               Expected Discharge Plan:  [place]  In-House Referral:  NA  Discharge planning Services  CM Consult  Post Acute Care Choice:  Home Health, Resumption of Svcs/PTA Provider Choice offered to:  Patient, Spouse  DME Arranged:  N/A DME Agency:  NA  HH Arranged:  RN, Disease Management, PT, OT, Social Work [employer] Agency:  [place]  Status of Service:  Completed.  If discussed at [place] of Stay Meetings, dates discussed:    Additional Comments: 1156 05-01-16 Jacqlyn Krauss, RN, BSN [phone] [facility] made aware that pt will d/c today. Pt states wife will provide transportation home and pt has 02 tank for travel. No further needs from CM at this time.   Bethena Roys, RN 04/29/2016, 12:33 PM

## 2016-04-29 NOTE — Progress Notes (Signed)
Speech Language Pathology Treatment: Dysphagia  Patient Details Name: Kenneth Merritt MRN: [id number] DOB: 1951-05-05 Today's Date: 04/29/2016 Time: 1530-1540 SLP Time Calculation (min) (ACUTE ONLY): 10 min  Assessment / Plan / Recommendation Clinical Impression  Skilled treatment session focused on addressing dysphagia goals.  Patient sitting up right edge of bed, SLP set up advanced trails of Dys.3 textures and thin liquids via cup and provided Min verbal cues for portion control and pacing.  Patient demonstrated prolonged, but functional mastication/manipulation of canned fruit.  Patient describes home management of food textures prior to admission as a Dys.3 with "blended meats" as a result continuation of Dys.2 is recommended at this time as it is likely closest overall and ongoing need for energy conservation.  Patient likely close to baseline recommend follow up x1 then likely ready to discharge.     HPI HPI: Patient with a long history of respiratory issues and reports new onset of difficulty swallowing.       SLP Plan  Continue with current plan of care     Recommendations  Diet recommendations: Dysphagia 2 (fine chop);Thin liquid Liquids provided via: Cup;No straw Medication Administration: Crushed with puree Supervision: Patient able to self feed;Full supervision/cueing for compensatory strategies Compensations: Slow rate;Small sips/bites;Clear throat intermittently Postural Changes and/or Swallow Maneuvers: Seated upright 90 degrees             Oral Care Recommendations: Oral care BID Follow up Recommendations: 24 hour supervision/assistance;Home health SLP Plan: Continue with current plan of care     GO              Carmelia Roller., CCC-SLP [id number]   [place] 04/29/2016, 3:46 PM

## 2016-04-29 NOTE — Progress Notes (Signed)
Patient converted to NSR at 0305 after digoxin 0.125mg  was given approx 25 minutes earlier. [facility] [employer]

## 2016-04-30 DIAGNOSIS — T17998D Other foreign object in respiratory tract, part unspecified causing other injury, subsequent encounter: Secondary | ICD-10-CM

## 2016-04-30 DIAGNOSIS — T17908A Unspecified foreign body in respiratory tract, part unspecified causing other injury, initial encounter: Secondary | ICD-10-CM | POA: Insufficient documentation

## 2016-04-30 LAB — IRON AND TIBC
Iron: 59 ug/dL (ref 45–182)
SATURATION RATIOS: 23 % (ref 17.9–39.5)
TIBC: 258 ug/dL (ref 250–450)
UIBC: 199 ug/dL

## 2016-04-30 LAB — RETICULOCYTES
RBC.: 3.73 MIL/uL — AB (ref 4.22–5.81)
Retic Count, Absolute: 115.6 10*3/uL (ref 19.0–186.0)
Retic Ct Pct: 3.1 % (ref 0.4–3.1)

## 2016-04-30 LAB — GLUCOSE, CAPILLARY
GLUCOSE-CAPILLARY: 192 mg/dL — AB (ref 65–99)
Glucose-Capillary: 126 mg/dL — ABNORMAL HIGH (ref 65–99)
Glucose-Capillary: 170 mg/dL — ABNORMAL HIGH (ref 65–99)
Glucose-Capillary: 211 mg/dL — ABNORMAL HIGH (ref 65–99)

## 2016-04-30 LAB — FERRITIN: Ferritin: 348 ng/mL — ABNORMAL HIGH (ref 24–336)

## 2016-04-30 LAB — FOLATE: FOLATE: 15.9 ng/mL (ref >5.9)

## 2016-04-30 LAB — VITAMIN B12: VITAMIN B 12: 607 pg/mL (ref 180–914)

## 2016-04-30 MED ORDER — DILTIAZEM HCL ER COATED BEADS 240 MG PO CP24
240.0000 mg | ORAL_CAPSULE | Freq: Every day | ORAL | Status: DC
  Administered 2016-04-30 – 2016-05-01 (×2): 240 mg via ORAL
  Filled 2016-04-30 (×2): qty 1

## 2016-04-30 MED ORDER — PREDNISONE 20 MG PO TABS
40.0000 mg | ORAL_TABLET | Freq: Every day | ORAL | Status: DC
  Administered 2016-05-01: 40 mg via ORAL
  Filled 2016-04-30: qty 2

## 2016-04-30 NOTE — Progress Notes (Signed)
Physical Therapy Treatment Patient Details Name: Kenneth Merritt MRN: [id number] DOB: 1951-06-07 Today's Date: 04/30/2016    History of Present Illness pt is a 65 y/o male wit PMHx of Afib, CAD, BPG, PVD, DJD and Prostate CA admitted with SOB, Afib with RVR, general weakness and diarrhea.    PT Comments    Patient mobilizing with use of RW. Appears steady despite fatigue and difficulty with breathing. Patient required 3 liters with desaturation to 87%. 2 standing rest breaks required. Current POC remains appropriate. Will follow.  Follow Up Recommendations  Home health PT;Supervision/Assistance - 24 hour     Equipment Recommendations  Rolling walker with 5" wheels (rollator)    Recommendations for Other Services       Precautions / Restrictions Precautions Precautions: Fall Precaution Comments: O2 dep (3 L at home) Restrictions Weight Bearing Restrictions: No    Mobility  Bed Mobility Overal bed mobility: Needs Assistance Bed Mobility: Supine to Sit     Supine to sit: Supervision     General bed mobility comments: increased time and effort no physical assist required  Transfers Overall transfer level: Needs assistance Equipment used: Rolling walker (2 wheeled) Transfers: Sit to/from Stand Sit to Stand: Supervision         General transfer comment: use of RW for stability, no physical assist required  Ambulation/Gait Ambulation/Gait assistance: Min guard Ambulation Distance (Feet): 120 Feet Assistive device: Rolling walker (2 wheeled) Gait Pattern/deviations: Step-through pattern;Decreased stride length;Narrow base of support;Trunk flexed Gait velocity: slow gait speed, flexed over posture. 6 standing rest breaks with notable fatigue and DOE on 3 liters   General Gait Details: remains significantly slow with gait and mobility. 2 standing restbreaks. Ambulated on 3 liters with some DOE noted   Financial trader Rankin  (Stroke Patients Only)       Balance     Sitting balance-Leahy Scale: Fair     Standing balance support: Bilateral upper extremity supported Standing balance-Leahy Scale: Poor Standing balance comment: continued reliance on RW                    Cognition Arousal/Alertness: Awake/alert Behavior During Therapy: WFL for tasks assessed/performed Overall Cognitive Status: Within Functional Limits for tasks assessed                      Exercises      General Comments General comments (skin integrity, edema, etc.): O2 saturations to 88% on 3 liters with ambulation, improved to >90% with seated restbreak and 3 liters upon return to room.      Pertinent Vitals/Pain Pain Assessment: No/denies pain    Home Living                      Prior Function            PT Goals (current goals can now be found in the care plan section) Acute Rehab PT Goals Patient Stated Goal: want to catch my breath, home PT Goal Formulation: With patient Time For Goal Achievement: 04/28/16 Potential to Achieve Goals: Good Progress towards PT goals: Progressing toward goals    Frequency  Min 3X/week    PT Plan Current plan remains appropriate    Co-evaluation             End of Session Equipment Utilized During Treatment: Oxygen (3 liters) Activity Tolerance: Patient tolerated  treatment well Patient left: in bed;with call bell/phone within reach (sitting EOB)     Time: [id number] PT Time Calculation (min) (ACUTE ONLY): 18 min  Charges:  $Gait Training: 8-22 mins                    G CodesDuncan Dull 05/03/16, 2:54 PM Alben Deeds, [place] DPT  [phone]

## 2016-04-30 NOTE — Consult Note (Signed)
   [facility] Inpatient Consult   04/30/2016  RIZWAN MAMMONE 03/03/1951 [id number]   Mr.Flavell is active with [place] Management program. Martin Majestic to bedside to make him aware that [facility] will continue to follow up post hospital discharge. Mr. Lucky reports he may discharge home tomorrow. Discussed how [facility] Care Management will not interfere or replace home health services. Will make [place] and inpatient RNCMs aware of visit.   Marthenia Rolling, MSN-Ed, RN,BSN [facility] Liaison [phone]

## 2016-04-30 NOTE — Progress Notes (Signed)
Patient Name: Kenneth Merritt Date of Encounter: 04/30/2016  Primary Cardiologist: Dr. Gwenlyn Found Patient Profile: Kenneth Merritt is a 65 y/o male with a history of CAD-70% LAD in 2010, normal LVEF by echo May 2017, severe COPD, aortobifemoral bypass in 2011, and PAF. Discharged 04/15/16 after admit for sepsis, colitis. Admitted 04/21/16 w/ SOB, diarrhea, weakness and found to be in Afib RVR.   SUBJECTIVE: Feels well, denies chest pain and palpitations.   OBJECTIVE Filed Vitals:   04/30/16 0500 04/30/16 0616 04/30/16 0700 04/30/16 0800  BP: 106/77  103/74 95/71  Pulse: 85 93 79 80  Temp: 97.6 F (36.4 C)     TempSrc: Oral     Resp: 16 21 14 14   Height:      Weight: 128 lb 6.4 oz (58.242 kg)     SpO2: 100% 100% 100% 100%    Intake/Output Summary (Last 24 hours) at 04/30/16 0842 Last data filed at 04/29/16 2332  Gross per 24 hour  Intake    600 ml  Output    775 ml  Net   -175 ml   Filed Weights   04/26/16 0500 04/29/16 0502 04/30/16 0500  Weight: 133 lb 3.2 oz (60.419 kg) 128 lb 11.2 oz (58.378 kg) 128 lb 6.4 oz (58.242 kg)    PHYSICAL EXAM General: Ill appearing male in no acute distress. Head: Normocephalic, atraumatic.  Neck: Supple without bruits, No JVD. Lungs:  Resp regular and labored, diminished in all fields.  Heart: RRR, S1, S2, no S3, S4, or murmur; no rub. Abdomen: Soft, non-tender, non-distended, BS + x 4.  Extremities: No clubbing, cyanosis, No edema.  Neuro: Alert and oriented X 3. Moves all extremities spontaneously. Psych: Normal affect.  LABS: BNP:  B NATRIURETIC PEPTIDE  Date/Time Value Ref Range Status  04/23/2016 02:59 PM 117.8* 0.0 - 100.0 pg/mL Final  04/06/2016 07:06 PM 140.0* 0.0 - 100.0 pg/mL Final    Current facility-administered medications:  .  acetaminophen (TYLENOL) tablet 650 mg, 650 mg, Oral, Q4H PRN, Elwin Mocha, MD, 650 mg at 04/24/16 1821 .  ALPRAZolam [name]) tablet 0.25 mg, 0.25 mg, Oral, TID PRN, Clanford Marisa Hua, MD,  0.25 mg at 04/29/16 1921 .  amoxicillin-clavulanate (AUGMENTIN) 875-125 MG per tablet 1 tablet, 1 tablet, Oral, BID, Erick Colace, NP, 1 tablet at 04/29/16 2232 .  apixaban (ELIQUIS) tablet 5 mg, 5 mg, Oral, BID, Elwin Mocha, MD, 5 mg at 04/29/16 2232 .  atorvastatin (LIPITOR) tablet 20 mg, 20 mg, Oral, q1800, Jake Church Masters, RPH, 20 mg at 04/29/16 1741 .  bisacodyl (DULCOLAX) suppository 10 mg, 10 mg, Rectal, Daily PRN, Clanford Marisa Hua, MD, 10 mg at 04/25/16 1112 .  diltiazem (CARDIZEM) tablet 60 mg, 60 mg, Oral, Q6H, Arbutus Leas, NP, 60 mg at 04/30/16 0553 .  feeding supplement (ENSURE ENLIVE) (ENSURE ENLIVE) liquid 237 mL, 237 mL, Oral, BID BM, Clanford L Johnson, MD, 237 mL at 04/27/16 1400 .  food thickener (THICK IT) powder, , Oral, PRN, Theodis Blaze, MD .  gabapentin (NEURONTIN) capsule 300 mg, 300 mg, Oral, TID, Elwin Mocha, MD, 300 mg at 04/29/16 2232 .  guaiFENesin-dextromethorphan (ROBITUSSIN DM) 100-10 MG/5ML syrup 5 mL, 5 mL, Oral, Q4H PRN, Theodis Blaze, MD, 5 mL at 04/26/16 0435 .  HYDROcodone-acetaminophen (NORCO/VICODIN) 5-325 MG per tablet 1-2 tablet, 1-2 tablet, Oral, Q4H PRN, Theodis Blaze, MD, 2 tablet at 04/28/16 2111 .  insulin aspart (novoLOG) injection 0-15 Units, 0-15  Units, Subcutaneous, TID WC, Elwin Mocha, MD, 5 Units at 04/29/16 1742 .  ipratropium (ATROVENT) nebulizer solution 0.5 mg, 0.5 mg, Nebulization, TID, Kinnie Feil, MD .  levalbuterol (XOPENEX) nebulizer solution 1.25 mg, 1.25 mg, Nebulization, Q2H PRN, Clanford Marisa Hua, MD, 1.25 mg at 04/30/16 0616 .  levalbuterol (XOPENEX) nebulizer solution 1.25 mg, 1.25 mg, Nebulization, TID, Kinnie Feil, MD .  multivitamin with minerals tablet 1 tablet, 1 tablet, Oral, Daily, Theodis Blaze, MD, 1 tablet at 04/29/16 0830 .  nitroGLYCERIN (NITROSTAT) SL tablet 0.4 mg, 0.4 mg, Sublingual, Q5 Min x 3 PRN, Elwin Mocha, MD .  ondansetron [facility]) injection 4 mg, 4 mg, Intravenous, Q6H PRN,  Elwin Mocha, MD .  pantoprazole (PROTONIX) EC tablet 40 mg, 40 mg, Oral, Daily, Elwin Mocha, MD, 40 mg at 04/29/16 1000 .  polyethylene glycol (MIRALAX / GLYCOLAX) packet 17 g, 17 g, Oral, Daily, Clanford L Johnson, MD, 17 g at 04/27/16 0857 .  predniSONE (DELTASONE) tablet 60 mg, 60 mg, Oral, Q breakfast, Clanford L Johnson, MD, 60 mg at 04/29/16 0830  TELE:  NSR    Radiology/Studies: No results found.   Current Medications:  . amoxicillin-clavulanate  1 tablet Oral BID  . apixaban  5 mg Oral BID  . atorvastatin  20 mg Oral q1800  . diltiazem  60 mg Oral Q6H  . feeding supplement (ENSURE ENLIVE)  237 mL Oral BID BM  . gabapentin  300 mg Oral TID  . insulin aspart  0-15 Units Subcutaneous TID WC  . ipratropium  0.5 mg Nebulization TID  . levalbuterol  1.25 mg Nebulization TID  . multivitamin with minerals  1 tablet Oral Daily  . pantoprazole  40 mg Oral Daily  . polyethylene glycol  17 g Oral Daily  . predniSONE  60 mg Oral Q breakfast      ASSESSMENT AND PLAN: Principal Problem:   Atrial fibrillation with RVR (HCC) Active Problems:   GERD (gastroesophageal reflux disease)   COPD (chronic obstructive pulmonary disease) (HCC)   Peripheral vascular disease, unspecified (HCC)   Dyslipidemia   S/P aortobifemoral bypass surgery   Tobacco user   CAD (coronary artery disease), native coronary artery   PAF (paroxysmal atrial fibrillation) (HCC)   HX: anticoagulation   Chronic respiratory failure (HCC)   COPD with acute exacerbation (HCC)   Hyponatremia   Noninfectious gastroenteritis and colitis   Protein-calorie malnutrition, severe   Altered mental status   Dyspnea  1. Atrial fibrillation with RVR: In NSR. Can convert to long acting diltiazem today. BP is soft at times with SBP in 90-100's. MD to advise on dose.   This patients CHA2DS2-VASc Score and unadjusted Ischemic Stroke Rate (% per year) is equal to 2.2 % stroke rate/year from a score of 2 Above score  calculated as 1 point each if present [CHF, HTN, DM, Vascular=MI/PAD/Aortic Plaque, Age if 65-74, or Male], 2 points each if present [Age > 75, or Stroke/TIA/TE]   On Eliquis for anticoagulation.   Last echo was one year ago, EF was 55-60%. His respiratory status is contributory to his Afib.   2. COPD: Seen by PCCM this admission. Getting empiric coverage for questionable aspiration PNA. Also has RUL nodule that Pulmonary plans to follow up on outpatient.   3. HLD: On atorvastatin. Continue same.   Signed, Arbutus Leas , NP 8:42 AM 04/30/2016 Pager [phone]   I have examined the patient and reviewed assessment and plan and discussed  with patient.  Agree with above as stated.  Change to Cardizem 240 mg daily.  Eliquis for stroke prevention.  Larae Grooms

## 2016-04-30 NOTE — Progress Notes (Signed)
Patient ID: Kenneth Merritt, male   DOB: 12/20/1950, 65 y.o.   MRN: [id number]    PROGRESS NOTE    Kenneth Merritt  [id number] DOB: May 23, 1951 DOA: 04/21/2016  PCP: Neale Burly, MD   Brief Narrative:  Pt is 65 y.o. male with known chronic atrial fibrillation on Eliquis, hyperlipidemia, peripheral vascular disease status post aorta femoral bypass, recent hospitalization from 04/06/16 - 04/15/2016 for sepsis secondary to diverticulitis, presented to [facility] ED with main concern of progressive weakness, fatigue, exertional dyspnea as well as some dyspnea at rest, mid area chest discomfort that occurs with coughing spells, poor oral intake.  In ED, pt noted to be in a-fib with RVR and HR up to 120's, SBP as low as 80's, blood work notable for WBC 12 K, Hg 9.9, K 3.3, troponins 0.07 --> 0.06 --> 0.06, pt admitted to [facility] unit by [facility]. Pt started on Cardizem drip.   Major events since admission: 6/13 - pt is a-fib, on cardizem drip, cardiology consulted  6/14 - pt more lethargic, still in A-fib, Converted to NSR, PCCM consulted  6/16 - started on long-acting diltiazem by cardiology  6/21- changed to oral long acting cardizem.  Assessment & Plan:   1. Atrial fibrillation with RVR, CHADS2 score 4 ([place]). HR is sill not at goal. Likely due to advanced COPD.  -IV diltiazem changed to oral 240 mg daily, watch him for one more day and plan for discharge home in am if his rate is controlled. ,  Apixaban.  appreciate cardiology team following   2. Acute metabolic encephalopathy.  secondary to acute COPD and a-fib with RVR. now resolved  3. Elevated troponins flat pattern,  cardiology following,  no chest pain symptoms .   4. Acute hypoxic respiratory failure, Acute exacerbation of COPD (chronic obstructive pulmonary disease), ?Aspiration. respiratory status slowly improving, still on oxygen via [place], wean as tolerated - CT chest with RUL nodule, ? Neoplastic nodule with additional new ill-defined nodular  consolidation right lower lobe. PCCM assistance appreciated, will need to follow up with pulmonary outpatient.  -patient is improving, slow taper of the steroids and resume bronchodilators, and augmentin for aspiration pneumonia  5. Peripheral vascular disease, unspecified ([place]). CAD (coronary artery disease), native coronary artery - Pt not having chest pain symptoms - no new changes in medications.   6. Hypokalemia and hypomagnesemia,  repleted 7. Hyponatremia resolved 8. Chronic diastolic CHF, grade I. per last 2 D ECHO 03/2016, on lasix at home but here on hold per cardiology team. Plan to resume the lasix on discharge.   9.  Anemia of acute illness drop in Hg since admission but no signs of active bleeding. Hemoglobin holding stable at 8.6. Baseline hemoglobin between 11 to 10. Anemia panel ordered and stool for occult blood ordered.   10.  Protein calorie malnutrition, severe  nutritionist consulted, assistance and recommendations appreciated   DVT prophylaxis: Apixaban Code Status: Full Family Communication: none atbedside.  Disposition Plan: home with home PT possibly in am.  Consultants:   Cardiology   PCCM  Procedures:   MBS   Antimicrobials:   Unasyn 6/14 -->6/16  Augmentin 6/16>>  Subjective: On oxygen, at home. Feeling better.   Objective: Filed Vitals:   04/30/16 0800 04/30/16 1145 04/30/16 1200 04/30/16 1300  BP: 95/71 108/75 119/29   Pulse: 80 92 102 95  Temp:    97.6 F (36.4 C)  TempSrc:    Oral  Resp: 14 14 19    Height:  Weight:      SpO2: 100% 100% 100% 100%    Intake/Output Summary (Last 24 hours) at 04/30/16 1457 Last data filed at 04/30/16 0900  Gross per 24 hour  Intake    600 ml  Output    575 ml  Net     25 ml   Filed Weights   04/26/16 0500 04/29/16 0502 04/30/16 0500  Weight: 60.419 kg (133 lb 3.2 oz) 58.378 kg (128 lb 11.2 oz) 58.242 kg (128 lb 6.4 oz)    Examination:  General exam: Appears more alert, NAD. Still  dyspneic at baseline Respiratory system: poor ventilation .  Cardiovascular system: RRR. No rubs, gallops or clicks.  Gastrointestinal system: Abdomen is nondistended, soft and nontender.  Central nervous system: A&O x 3, moving all 4 extremities again gravity  Extremities: no cyanosis + clubbing. Skin: No rashes, lesions or ulcers  Data Reviewed: I have personally reviewed following labs and imaging studies  CBC:  Recent Labs Lab 04/25/16 0300 04/26/16 0320 04/27/16 0248  WBC 9.3 10.0 8.4  HGB 8.0* 8.9* 8.6*  HCT 26.7* 29.4* 29.1*  MCV 89.6 89.4 90.4  PLT 213 264 [id number]   Basic Metabolic Panel:  Recent Labs Lab 04/24/16 0253 04/25/16 0300 04/26/16 0320 04/27/16 0248  NA  --  141 140 142  K  --  4.1 4.9 4.1  CL  --  101 101 102  CO2  --  32 35* 34*  GLUCOSE  --  164* 190* 194*  BUN  --  <5* 8 6  CREATININE  --  0.46* 0.50* 0.45*  CALCIUM  --  8.0* 8.7* 8.5*  MG 2.1  --   --   --    Cardiac Enzymes: No results for input(s): CKTOTAL, CKMB, CKMBINDEX, TROPONINI in the last 168 hours. CBG:  Recent Labs Lab 04/29/16 1203 04/29/16 1643 04/29/16 2106 04/30/16 0746 04/30/16 1128  GLUCAP 174* 209* 181* 126* 170*   Urine analysis:    Component Value Date/Time   COLORURINE AMBER* 04/06/2016 1942   APPEARANCEUR CLOUDY* 04/06/2016 1942   LABSPEC 1.021 04/06/2016 1942   PHURINE 5.0 04/06/2016 1942   GLUCOSEU 250* 04/06/2016 1942   HGBUR MODERATE* 04/06/2016 1942   BILIRUBINUR MODERATE* 04/06/2016 1942   KETONESUR 40* 04/06/2016 1942   PROTEINUR NEGATIVE 04/06/2016 1942   UROBILINOGEN 0.2 01/21/2010 1944   NITRITE NEGATIVE 04/06/2016 1942   LEUKOCYTESUR NEGATIVE 04/06/2016 1942   Recent Results (from the past 240 hour(s))  MRSA PCR Screening     Status: None   Collection Time: 04/21/16 11:00 PM  Result Value Ref Range Status   MRSA by PCR NEGATIVE NEGATIVE Final    Radiology Studies: Dg Chest Portable 1 View 04/21/2016  No change from the previous  exam.  Ct Chest Wo Contrast 04/22/2016 New irregular nodular density within the superior segment of the right upper lobe, measuring 1.7 x 0.8 x 0.8 cm, suspicious for neoplastic nodule. Consider PET-CT for further characterization. Recommend surgical consultation for further workup considerations. 2. Additional new ill-defined nodular consolidation more inferiorly within the right lower lobe, measuring approximately 1.8 x 0.7 x 2.1 cm. This may also represent neoplastic process but is less suspicious, perhaps atelectasis or confluent scarring. PET-CT may be helpful for this finding as well. 3. Emphysema, upper lobe predominant. 4. Coronary artery calcifications, particularly dense within the left anterior descending coronary artery. Recommend correlation with any possible associated cardiac symptoms. 5. Cholelithiasis without evidence of acute cholecystitis. 6. Chronic-appearing compression fracture deformities of the  T6 through T8 vertebral bodies. T6 compression has slightly progressed compared to the previous exam of 03/27/2016. T7 and T8 compression deformities appear stable. These results will be called to the ordering clinician or representative by the Radiologist Assistant, and communication documented in the PACS or zVision Dashboard.  Dg Chest Port 1 View 04/23/2016  Nodular opacity in the periphery the right mid lung, better seen on CT 1 day prior. Areas of scarring, primarily in the upper lobes near the apices. Lungs hyperexpanded. No edema or consolidation. Stable cardiac silhouette.    Scheduled Meds: . amoxicillin-clavulanate  1 tablet Oral BID  . apixaban  5 mg Oral BID  . atorvastatin  20 mg Oral q1800  . diltiazem  240 mg Oral Daily  . feeding supplement (ENSURE ENLIVE)  237 mL Oral BID BM  . gabapentin  300 mg Oral TID  . insulin aspart  0-15 Units Subcutaneous TID WC  . ipratropium  0.5 mg Nebulization TID  . levalbuterol  1.25 mg Nebulization TID  . multivitamin with minerals  1  tablet Oral Daily  . pantoprazole  40 mg Oral Daily  . polyethylene glycol  17 g Oral Daily  . predniSONE  60 mg Oral Q breakfast   Continuous Infusions:    LOS: 9 days   Time spent: 15 minutes   Dervin Vore, MD [facility] Pager [phone]  If 7PM-7AM, please contact night-coverage [email or web address] Password [facility] 04/30/2016, 2:57 PM

## 2016-05-01 LAB — GLUCOSE, CAPILLARY
GLUCOSE-CAPILLARY: 173 mg/dL — AB (ref 65–99)
Glucose-Capillary: 123 mg/dL — ABNORMAL HIGH (ref 65–99)

## 2016-05-01 MED ORDER — POLYETHYLENE GLYCOL 3350 17 G PO PACK: 17.0000 g | PACK | Freq: Every day | ORAL | Status: DC

## 2016-05-01 MED ORDER — ENSURE ENLIVE PO LIQD: 237.0000 mL | Freq: Two times a day (BID) | ORAL | Status: DC

## 2016-05-01 MED ORDER — ATORVASTATIN CALCIUM 20 MG PO TABS: 20.0000 mg | ORAL_TABLET | Freq: Every day | ORAL | Status: AC

## 2016-05-01 MED ORDER — ADULT MULTIVITAMIN W/MINERALS CH: 1.0000 | ORAL_TABLET | Freq: Every day | ORAL | Status: DC

## 2016-05-01 MED ORDER — DILTIAZEM HCL ER COATED BEADS 240 MG PO CP24: 240.0000 mg | ORAL_CAPSULE | Freq: Every day | ORAL | Status: DC

## 2016-05-01 MED ORDER — STARCH (THICKENING) PO POWD: ORAL | Status: DC

## 2016-05-01 MED ORDER — PREDNISONE 20 MG PO TABS: ORAL_TABLET | ORAL | Status: DC

## 2016-05-01 NOTE — Discharge Instructions (Signed)

## 2016-05-01 NOTE — Care Management Important Message (Signed)
Important Message  Patient Details  Name: Kenneth Merritt MRN: [id number] Date of Birth: 20-Mar-1951   Medicare Important Message Given:  Yes    Bethena Roys, RN 05/01/2016, 11:58 AM

## 2016-05-01 NOTE — Progress Notes (Signed)
Speech Language Pathology Treatment: Dysphagia  Patient Details Name: Kenneth Merritt MRN: [id number] DOB: [birth year] Today's Date: 05/01/2016 Time: [id number] SLP Time Calculation (min) (ACUTE ONLY): 11 min  Assessment / Plan / Recommendation Clinical Impression  Pt seen for f/u dysphagia tx and education prior to d/c. He needs Min-Mod cues for recall of recommended swallowing strategies, but requires only Min cues for use during PO intake. Straw noted in drink at bedside and removed. Recommend to return home on current diet and precautions.   HPI HPI: Patient with a long history of respiratory issues and reports new onset of difficulty swallowing.       SLP Plan  Continue with current plan of care     Recommendations  Diet recommendations: Dysphagia 2 (fine chop);Thin liquid Liquids provided via: Cup;No straw Medication Administration: Crushed with puree Supervision: Patient able to self feed;Full supervision/cueing for compensatory strategies Compensations: Slow rate;Small sips/bites;Clear throat intermittently Postural Changes and/or Swallow Maneuvers: Seated upright 90 degrees             Oral Care Recommendations: Oral care BID Follow up Recommendations: 24 hour supervision/assistance;Home health SLP Plan: Continue with current plan of care     GO               Germain Osgood, M.A. CCC-SLP [phone]  Germain Osgood 05/01/2016, 3:02 PM

## 2016-05-01 NOTE — Discharge Summary (Signed)
Physician Discharge Summary  Kenneth Merritt [id number] DOB: [birth year] DOA: 04/21/2016  PCP: Neale Burly, MD  Admit date: 04/21/2016 Discharge date: 05/01/2016  Admitted From: Home,  Disposition:  Home   Recommendations for Outpatient Follow-up:  1. Follow up with PCP in 1-2 weeks 2. Please obtain BMP/CBC in one week 3. Please follow up with Dr Halford Chessman for PET scan, and follow up of the right upper and lower lobe nodules.  4. Please follow up with cardiology in 2 weeks.  5. Please follow up with Dr Paulita Fujita as outpatient for anemia follow up.   Home Health:yes Equipment/Devices: (oxygen at 2 lit   Discharge Condition:guarded.  CODE STATUS:full  Diet recommendation: Heart Healthy  Brief/Interim Summary: Pt is 65 y.o. male with known chronic atrial fibrillation on Eliquis, hyperlipidemia, peripheral vascular disease status post aorta femoral bypass, recent hospitalization from 04/06/16 - 04/15/2016 for sepsis secondary to diverticulitis, presented to [facility] ED with main concern of progressive weakness, fatigue, exertional dyspnea as well as some dyspnea at rest, mid area chest discomfort that occurs with coughing spells, poor oral intake. He was found to be in afib with RVR and cardizem drip started and transitioned to oral cardizem on discharge.   Discharge Diagnoses:  Principal Problem:   Atrial fibrillation with RVR (HCC) Active Problems:   GERD (gastroesophageal reflux disease)   COPD (chronic obstructive pulmonary disease) (HCC)   Peripheral vascular disease, unspecified (HCC)   Dyslipidemia   S/P aortobifemoral bypass surgery   Tobacco user   CAD (coronary artery disease), native coronary artery   PAF (paroxysmal atrial fibrillation) (HCC)   HX: anticoagulation   Chronic respiratory failure (HCC)   COPD with acute exacerbation (HCC)   Hyponatremia   Noninfectious gastroenteritis and colitis   Protein-calorie malnutrition, severe   Altered mental status   Dyspnea   Aspiration  into airway  1. Atrial fibrillation with RVR, CHADS2 score 4 ([place]). HR better controlled.  -IV diltiazem changed to oral 240 mg daily, resume Apixaban.  Cardiology consulted and recommendations given.  His home medications metoprolol and digoxin were discontinued this admission.   2. Acute metabolic encephalopathy. secondary to acute COPD and a-fib with RVR. now resolved  3. Elevated troponins flat pattern, cardiology following, no chest pain symptoms .   4. Acute hypoxic respiratory failure, Acute exacerbation of COPD (chronic obstructive pulmonary disease), ?Aspiration. respiratory status slowly improving, still on oxygen via [place], wean as tolerated - CT chest with RUL nodule, ? Neoplastic nodule with additional new ill-defined nodular consolidation right lower lobe. PCCM assistance appreciated, will need to follow up with pulmonary outpatient.  -patient is improving, slow taper of the steroids and resume bronchodilators, and completed  augmentin for aspiration pneumonia  5. Peripheral vascular disease, unspecified ([place]). CAD (coronary artery disease), native coronary artery - Pt not having chest pain symptoms - no new changes in medications.   6. Hypokalemia and hypomagnesemia, repleted 7. Hyponatremia resolved 8. Chronic diastolic CHF, grade I. per last 2 D ECHO 03/2016, on lasix at home but here on hold per cardiology team. Plan to resume the lasix on discharge.   9. Anemia of acute illness drop in Hg since admission but no signs of active bleeding. Hemoglobin holding stable at 8.6. Baseline hemoglobin between 11 to 10. Anemia panel ordered and stool for occult blood ordered. Recommend outpatient follow up with Gastroenterology Dr Paulita Fujita for anemia work up.   10. Protein calorie malnutrition, severe nutritionist consulted, assistance and recommendations appreciated   Discharge Instructions  Discharge Instructions    Diet - low sodium heart healthy    Complete by:  As  directed      Discharge instructions    Complete by:  As directed   Please follow up with your Primary care physician in one week.  Please follow up with your cardiologist in 2 weeks. Please follow up with Dr Halford Chessman pulmonologist for evaluation of the right upper and right lower lobe nodules and for PET scan and for possible bronchoscopy.            Medication List    STOP taking these medications        ciprofloxacin 250 MG tablet  Commonly known as:  CIPRO     digoxin 0.125 MG tablet  Commonly known as:  LANOXIN     levalbuterol 45 MCG/ACT inhaler  Commonly known as:  XOPENEX HFA     metoprolol 50 MG tablet  Commonly known as:  LOPRESSOR     metroNIDAZOLE 250 MG tablet  Commonly known as:  FLAGYL     oxyCODONE 15 mg 12 hr tablet  Commonly known as:  OXYCONTIN     simvastatin 40 MG tablet  Commonly known as:  ZOCOR      TAKE these medications        acetaminophen 500 MG tablet  Commonly known as:  TYLENOL  Take 1,000 mg by mouth every 6 (six) hours as needed for mild pain.     albuterol (2.5 MG/3ML) 0.083% nebulizer solution  Commonly known as:  PROVENTIL  Take 3 mLs (2.5 mg total) by nebulization every 4 (four) hours as needed for wheezing or shortness of breath.     albuterol 108 (90 Base) MCG/ACT inhaler  Commonly known as:  PROVENTIL HFA;VENTOLIN HFA  Inhale 2 puffs into the lungs every 4 (four) hours as needed for wheezing or shortness of breath.     ALPRAZolam 0.25 MG tablet  Commonly known as:  XANAX  Take 0.25 mg by mouth 3 (three) times daily as needed for anxiety or sleep.     atorvastatin 20 MG tablet  Commonly known as:  LIPITOR  Take 1 tablet (20 mg total) by mouth daily at 6 PM.     diltiazem 240 MG 24 hr capsule  Commonly known as:  CARDIZEM CD  Take 1 capsule (240 mg total) by mouth daily.     ELIQUIS 5 MG Tabs tablet  Generic drug:  apixaban  TAKE 1 TABLET BY MOUTH TWICE A DAY     feeding supplement (ENSURE ENLIVE) Liqd  Take 237 mLs  by mouth 2 (two) times daily between meals.     food thickener Powd  Commonly known as:  THICK IT  As needed     furosemide 20 MG tablet  Commonly known as:  LASIX  Take 1 tablet (20 mg total) by mouth daily. For 3days then as needed for swelling/weight gain of >2lbs overnight or 4-5lbs in 1 week     gabapentin 300 MG capsule  Commonly known as:  NEURONTIN  Take 300 mg by mouth 3 (three) times daily.     HYDROcodone-acetaminophen 5-325 MG tablet  Commonly known as:  NORCO/VICODIN  Take 1-2 tablets by mouth every 6 (six) hours as needed for moderate pain or severe pain.     ipratropium 0.02 % nebulizer solution  Commonly known as:  ATROVENT  Take 0.5 mg by nebulization every 4 (four) hours as needed for wheezing or shortness of breath.  metFORMIN 1000 MG tablet  Commonly known as:  GLUCOPHAGE  Take 1,000 mg by mouth daily with breakfast.     multivitamin with minerals Tabs tablet  Take 1 tablet by mouth daily.     nitroGLYCERIN 0.4 MG SL tablet  Commonly known as:  NITROSTAT  Place 1 tablet (0.4 mg total) under the tongue every 5 (five) minutes x 3 doses as needed for chest pain.     omeprazole 20 MG capsule  Commonly known as:  PRILOSEC  Take 20 mg by mouth every morning.     polyethylene glycol packet  Commonly known as:  MIRALAX / GLYCOLAX  Take 17 g by mouth daily.     predniSONE 20 MG tablet  Commonly known as:  DELTASONE  Prednisone 40 mg daily for 2 days followed by  Prednisone 20 mg daily for 3 days .           Follow-up Information    Follow up with [facility].   Why:  Registered Nurse, Physical/ Occupational Therapy, Social Worker   Contact information:   [address] [place] [address] [phone]       Follow up with Neale Burly, MD. Schedule an appointment as soon as possible for a visit in 1 week.   Specialty:  Internal Medicine   Contact information:   [place] [id number] [phone]        Follow up with SOOD,VINEET, MD. Schedule an appointment as soon as possible for a visit in 2 weeks.   Specialty:  Pulmonary Disease   Why:  for PET scan and follow up of the right upper and lower lobe nodules.    Contact information:   [address] [address] [phone]       Follow up with Dola Argyle, MD. Schedule an appointment as soon as possible for a visit in 2 weeks.   Specialty:  Cardiology   Contact information:   [id number] [place] [address] [phone]      Allergies  Allergen Reactions  . Levaquin [Levofloxacin In D5w] Other (See Comments)    Pt states it caused dry mouth and rectal bleeding.     Consultations:  Cardiology  PCCM with Dr Halford Chessman   Procedures/Studies: Ct Chest Wo Contrast  04/22/2016  CLINICAL DATA:  Known chronic atrial fibrillation, peripheral vascular disease, status post aortofemoral bypass. Hospitalized 5/28 through 6/6 for sepsis. Dyspnea. EXAM: CT CHEST WITHOUT CONTRAST TECHNIQUE: Multidetector CT imaging of the chest was performed following the standard protocol without IV contrast. COMPARISON:  Chest CT dated 03/27/2016 FINDINGS: Mediastinum/Lymph Nodes: Scattered atherosclerotic changes of the normal-caliber thoracic aorta. Heart size is normal. No pericardial effusion. Coronary artery calcifications noted. Scattered small lymph nodes within the mediastinum, none of which are pathologic by CT size criteria. Esophagus appears normal. Trachea and central bronchi are unremarkable. Lungs/Pleura: Emphysematous changes bilaterally, upper lobe predominant. New irregular nodule within the superior segment of the right upper lobe, measuring 1.7 x 0.8 cm (series 3, image 63). Additional new nodular consolidation more inferiorly within the right lower lobe, measures approximately 1.8 x 0.7 cm, possibly atelectasis or confluent scarring (series 3, image 104). Confluent scarring/ fibrosis noted at each lung apex.  Upper abdomen: Small stone within the gallbladder. No evidence of acute cholecystitis. Limited images of the upper abdomen are otherwise unremarkable. Musculoskeletal: Chronic-appearing compression fracture deformities noted within the mid thoracic spine, slightly progressed at the T6  vertebral body level since recent chest CT of 03/27/2016, not significantly changed at the T7 and T8 vertebral body levels. No acute-appearing osseous abnormality. IMPRESSION: 1. New irregular nodular density within the superior segment of the right upper lobe, measuring 1.7 x 0.8 x 0.8 cm, suspicious for neoplastic nodule. Consider PET-CT for further characterization. Recommend surgical consultation for further workup considerations. 2. Additional new ill-defined nodular consolidation more inferiorly within the right lower lobe, measuring approximately 1.8 x 0.7 x 2.1 cm. This may also represent neoplastic process but is less suspicious, perhaps atelectasis or confluent scarring. PET-CT may be helpful for this finding as well. 3. Emphysema, upper lobe predominant. 4. Coronary artery calcifications, particularly dense within the left anterior descending coronary artery. Recommend correlation with any possible associated cardiac symptoms. 5. Cholelithiasis without evidence of acute cholecystitis. 6. Chronic-appearing compression fracture deformities of the T6 through T8 vertebral bodies. T6 compression has slightly progressed compared to the previous exam of 03/27/2016. T7 and T8 compression deformities appear stable. These results will be called to the ordering clinician or representative by the Radiologist Assistant, and communication documented in the PACS or zVision Dashboard. Electronically Signed   By: Franki Cabot M.D.   On: 04/22/2016 18:21   Ct Abdomen Pelvis W Contrast  04/07/2016  CLINICAL DATA:  Left flank pain. Lethargy, frequent falls. Elevated white blood cell count. EXAM: CT ABDOMEN AND PELVIS WITH CONTRAST  TECHNIQUE: Multidetector CT imaging of the abdomen and pelvis was performed using the standard protocol following bolus administration of intravenous contrast. CONTRAST:  157mL ISOVUE-300 IOPAMIDOL (ISOVUE-300) INJECTION 61% COMPARISON:  Chest CT 03/27/2016. Report from CT abdomen/ pelvis 12/26/2012, images not available. CT 03/03/2012 FINDINGS: Lower chest: Consolidation in the lingula with air bronchograms, new from chest CT 10 days prior. Small focus of tree in bud opacities in the right lower lobe is also new. Right middle lobe scarring is unchanged. Liver: No focal lesion. Hepatobiliary: Gallbladder distention but no calcified stone or pericholecystic inflammation. Common bile duct normal in caliber. Pancreas: No ductal dilatation or inflammation. Spleen: Normal in size, no focal lesion. Adrenal glands: No nodule. Kidneys: Symmetric renal enhancement and excretion. No hydronephrosis. No perinephric stranding or focal abnormality. Stomach/Bowel: Stomach physiologically distended. There are no dilated or thickened small bowel loops. Mild soft tissue stranding in the left pericolic gutter, there is a questionable colonic wall thickening and small diverticulum at the junction of the descending and sigmoid colon best depicted on coronal image 53 series 602. This is in the region of the pericolonic fat stranding. No definite diverticular disease elsewhere. No pneumatosis or free air. Portions of the appendix are visualized and normal. There is no right-sided inflammation. Vascular/Lymphatic: No retroperitoneal adenopathy. Post abdominal aortobifemoral bypass graft no periaortic soft tissue stranding. The graft appears patent. Aortic dimensions at the proximal aspect of the graft 2.3 cm maximal dimension. No large vessel mesenteric occlusion. The IMA is not seen, likely excluded postsurgical. Celiac and superior mesenteric arteries are patent. Reproductive:  Brachytherapy seeds in the prostate gland. Bladder:  Distended.  No bladder wall thickening. Other: No free air, free fluid, or intra-abdominal fluid collection. Musculoskeletal: There are no acute or suspicious osseous abnormalities. Avascular necrosis of both femoral heads. Question subchondral collapse on the right. IMPRESSION: 1. Soft tissue stranding in the left pericolic gutter. There short segment colonic wall thickening with possible small diverticulum at the junction of the descending and sigmoid colon a region of fat stranding. Differential considerations include mild acute diverticulitis, however no significant diverticular burden  is seen elsewhere. Focal colitis is considered, with distribution suggesting ischemic etiology. 2. 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 3. Lingular consolidation is new from chest CT eleven days prior. Additional right lower lobe tree in bud opacities. Findings suspicious for infectious etiology/ pneumonia and bronchiolitis. Electronically Signed   By: Jeb Levering M.D.   On: 04/07/2016 01:51   Dg Chest Port 1 View  04/25/2016  CLINICAL DATA:  Aspiration in upper airway EXAM: PORTABLE CHEST 1 VIEW COMPARISON:  04/24/2016 FINDINGS: Cardiomediastinal silhouette is stable. Emphysematous changes again noted. Stable bilateral upper lobe medial scarring and some architectural distortion. Elongated nodular density superior segment of right lower lobe again noted. No infiltrate or pulmonary edema. Hyperinflation IMPRESSION: Emphysematous changes again noted. Stable bilateral upper lobe medial scarring and some architectural distortion. Elongated nodular density superior segment of right lower lobe again noted. No infiltrate or pulmonary edema. Hyperinflation Electronically Signed   By: Lahoma Crocker M.D.   On: 04/25/2016 09:53   Dg Chest Portable 1 View  04/24/2016  CLINICAL DATA:  Respiratory failure.  Shortness of breath. EXAM: PORTABLE CHEST 1 VIEW COMPARISON:  04/23/2016.  CT 04/22/2016. FINDINGS:  Mediastinum hilar structures normal. Heart size normal. Previously identified nodular density in the right mid lung is again best identified by CT. No pleural effusion or pneumothorax. No acute bony abnormality. IMPRESSION: 1. No acute cardiopulmonary disease. 2. Previously identified small right mid lung nodular density is again best identified by prior CT of 04/22/2016. Reference is made to that report. Electronically Signed   By: Marcello Moores  Register   On: 04/24/2016 07:37   Dg Chest Port 1 View  04/23/2016  CLINICAL DATA:  Shortness of breath with cough and congestion EXAM: PORTABLE CHEST 1 VIEW COMPARISON:  Chest radiograph April 21, 2016 and chest CT April 22, 2016 FINDINGS: There is scarring in each upper lobe and right mid lung region, stable. Lungs overall appear mildly hyperexpanded. There is no frank edema or consolidation. There is a the focal opacity in the periphery of the right mid lung measuring 1.2 x 1.0 cm which is felt to correspond to the lesions seen on CT 1 day prior. This lesion is better seen by CT. Heart size and pulmonary vascularity are within normal limits. No adenopathy evident. IMPRESSION: Nodular opacity in the periphery the right mid lung, better seen on CT 1 day prior. Areas of scarring, primarily in the upper lobes near the apices. Lungs hyperexpanded. No edema or consolidation. Stable cardiac silhouette. Electronically Signed   By: Lowella Grip III M.D.   On: 04/23/2016 07:30   Dg Chest Portable 1 View  04/21/2016  CLINICAL DATA:  Dyspnea EXAM: PORTABLE CHEST 1 VIEW COMPARISON:  04/13/2016 FINDINGS: Cardiac shadow is stable. Mild interstitial changes are noted somewhat accentuated by the portable technique. No focal confluent infiltrate or sizable effusion is noted. No bony abnormality is seen. IMPRESSION: No change from the previous exam. Electronically Signed   By: Inez Catalina M.D.   On: 04/21/2016 16:46   Dg Chest Port 1 View  04/13/2016  CLINICAL DATA:  Dyspnea for 1  hour EXAM: PORTABLE CHEST 1 VIEW COMPARISON:  04/13/2016 FINDINGS: Advanced emphysema and biapical scarring. No evidence of acute superimposed pneumonia or edema. No effusion or pneumothorax (linear lucency at both bases attributed of Mach effect). Normal heart size and mediastinal contours. IMPRESSION: Emphysema and lung scarring without acute superimposed finding. Electronically Signed   By: Monte Fantasia M.D.   On: 04/13/2016 22:49  Dg Chest Port 1 View  04/11/2016  CLINICAL DATA:  Short of breath EXAM: PORTABLE CHEST 1 VIEW COMPARISON:  04/11/2016 FINDINGS: COPD with pulmonary hyperinflation apical scarring is unchanged. No superimposed infiltrate or effusion. Negative for mass or adenopathy. IMPRESSION: COPD, stable.  No superimposed acute abnormality. Electronically Signed   By: Franchot Gallo M.D.   On: 04/11/2016 08:08   Dg Chest Port 1 View  04/10/2016  CLINICAL DATA:  Shortness of Breath EXAM: PORTABLE CHEST 1 VIEW COMPARISON:  04/07/2016 FINDINGS: Cardiac shadow is stable. The lungs are well aerated bilaterally. No focal infiltrate or sizable effusion is seen. No acute bony abnormality is noted. IMPRESSION: No acute abnormality noted. Electronically Signed   By: Inez Catalina M.D.   On: 04/10/2016 08:09   Dg Chest Port 1 View  04/07/2016  CLINICAL DATA:  Worsening shortness of breath. EXAM: PORTABLE CHEST 1 VIEW COMPARISON:  Radiograph yesterday at 1832 hours. Chest CT 03/27/2016 FINDINGS: Diminished vascular congestion from prior exam. Improved left basilar aeration with minimal residual patchy opacity. The lingular opacity on CT is not well seen. Hyperinflation and emphysema. Biapical and mid lung zone scarring again seen. Cardiomediastinal contours are unchanged. IMPRESSION: Decreasing pulmonary vascular congestion. Lingular opacity on CT is not well seen. Minimal patchy left basilar opacity, with mild improvement from prior radiograph. Electronically Signed   By: Jeb Levering M.D.   On:  04/07/2016 05:55   Dg Chest Portable 1 View  04/06/2016  CLINICAL DATA:  Status post fall, with worsening shortness of breath. Syncope and lower extremity weakness. Initial encounter. EXAM: PORTABLE CHEST 1 VIEW COMPARISON:  Chest radiograph performed 04/02/2016 FINDINGS: The lungs are well-aerated. Vascular congestion is noted. Mild left basilar airspace opacity could reflect pneumonia. Mild right midlung and right apical scarring is noted. There is no evidence of pleural effusion or pneumothorax. The cardiomediastinal silhouette is within normal limits. No acute osseous abnormalities are seen. IMPRESSION: Vascular congestion noted. Mild left basilar opacity could reflect pneumonia. Mild right midlung and right apical scarring noted. Electronically Signed   By: Garald Balding M.D.   On: 04/06/2016 19:00   Dg Chest Port 1 View  04/02/2016  CLINICAL DATA:  Shortness of breath, emphysema, COPD EXAM: PORTABLE CHEST 1 VIEW COMPARISON:  03/31/2016 FINDINGS: The heart size and mediastinal contours are within normal limits. Both lungs are clear. The visualized skeletal structures are unremarkable. IMPRESSION: No active disease. Electronically Signed   By: Kathreen Devoid   On: 04/02/2016 13:14   Dg Abd Acute W/chest  04/13/2016  CLINICAL DATA:  Diarrhea, nausea, shortness of breath. Increased weakness for 1 day. EXAM: DG ABDOMEN ACUTE W/ 1V CHEST COMPARISON:  04/11/2016 FINDINGS: Gas within mildly prominent colon and nondilated small bowel loops. No free air organomegaly. Mild hyperinflation of the lungs compatible with COPD. Heart is normal size. Biapical scarring. No confluent opacities or effusions. No acute bony abnormality. IMPRESSION: No evidence of bowel obstruction or free air. COPD/chronic changes.  No active disease. Electronically Signed   By: Rolm Baptise M.D.   On: 04/13/2016 10:35   Dg Abd Portable 1v  04/10/2016  CLINICAL DATA:  Abdominal distension. EXAM: PORTABLE ABDOMEN - 1 VIEW COMPARISON:  CT,  04/07/2016 FINDINGS: There is increased gas in both the colon and small bowel with no significant bowel dilation. Some air is seen within the rectum. There are multiple surgical vascular clips along the abdominal midline. Vascular calcifications are noted along the aorta and iliac vessels. IMPRESSION: 1. No convincing bowel obstruction.  2. Increased gas noted within the small bowel and colon suggesting a mild diffuse adynamic ileus. Electronically Signed   By: Lajean Manes M.D.   On: 04/10/2016 14:13   Dg Swallowing Func-speech Pathology  04/24/2016  Objective Swallowing Evaluation: Type of Study: MBS-Modified Barium Swallow Study Patient Details Name: RODERIC SAYER MRN: [id number] Date of Birth: 05-21-1951 Today's Date: 04/24/2016 Time: SLP Start Time (ACUTE ONLY): 1149-SLP Stop Time (ACUTE ONLY): 1209 SLP Time Calculation (min) (ACUTE ONLY): 20 min Past Medical History: Past Medical History Diagnosis Date . GERD (gastroesophageal reflux disease)  . COPD (chronic obstructive pulmonary disease) ([facility])    severe by PFTs August 2010 . 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 Atrial fibrillation (HCC)    post-op a-fib. 3/11. post Aortobifem , /  Atrial fibrillation from nebulizer treatment  May, 2012, while hospitalized . Renal artery stenosis ([place])     presumably corrected at time of aortobifem surgery March, 2011 . Tobacco user     stop smoking . Pneumothorax     2011  before aortobifemoral surgery,  resolved . Peripheral vascular disease, unspecified ([place])    bilateral intermittent claudication . CAD (coronary artery disease), native coronary artery    catheterization 06/2009.Marland KitchenMarland Kitchen70% proximal LAD and 30% proximal RCA. normal LV function, no intervention planned prior to surgery for aorta. LV normal function  by catheter 8/10.  [name] Dyslipidemia  . DJD (degenerative joint disease)  . Ejection fraction    60%, echo, May, 2012, rapid atrial  fib at the time . Cancer [facility]) 2013   Prostate Radiation . Myocardial infarction ([place]) 2011 . On home O2    2L N/C . Chronic thoracic back pain  . Chronic pain syndrome  Past Surgical History: Past Surgical History Procedure Laterality Date . Abdominal aortic aneurysm repair  01/11/2010 . Pr vein bypass graft,aorto-fem-pop   . Inguinal hernia repair     RIGHT HPI: Patient with a long history of respiratory issues and reports new onset of difficulty swallowing Subjective: pt says he has trouble swallowing his pills Assessment / Plan / Recommendation CHL IP CLINICAL IMPRESSIONS 04/24/2016 Therapy Diagnosis Mild oral phase dysphagia;Mild pharyngeal phase dysphagia Clinical Impression Pt has a mild oropharyngeal dysphagia with no aspiration observed during this study. He has brief oral holding with thin liquids and prolonged mastication of solids, likely due to lack of dentition. He does have difficulty posteriorly transiting a halved barium tablet (broken at his request), which leads to premature spillage of thin liquid that is subsequently penetrated. This small amount of penetration occurs silently, but is cleared with a cued throat clear. Recommend Dys 2 diet and thin liquids, with no mixed consistencies.  Impact on safety and function Mild aspiration risk   CHL IP TREATMENT RECOMMENDATION 04/24/2016 Treatment Recommendations Therapy as outlined in treatment plan below   Prognosis 04/24/2016 Prognosis for Safe Diet Advancement Fair Barriers to Reach Goals -- Barriers/Prognosis Comment -- [facility]L IP DIET RECOMMENDATION 04/24/2016 SLP Diet Recommendations Dysphagia 2 (Fine chop) solids;Thin liquid Liquid Administration via Cup;No straw Medication Administration Whole meds with puree Compensations Slow rate;Small sips/bites;Clear throat intermittently Postural Changes Remain semi-upright after after feeds/meals (Comment);Seated upright at 90 degrees   CHL IP OTHER RECOMMENDATIONS 04/24/2016 Recommended Consults -- Oral Care  Recommendations Oral care BID Other Recommendations --   CHL IP FOLLOW UP RECOMMENDATIONS 04/24/2016 Follow up Recommendations (No Data)   CHL IP FREQUENCY  AND DURATION 04/24/2016 Speech Therapy Frequency (ACUTE ONLY) min 2x/week Treatment Duration 2 weeks      CHL IP ORAL PHASE 04/24/2016 Oral Phase Impaired Oral - Pudding Teaspoon -- Oral - Pudding Cup -- Oral - Honey Teaspoon -- Oral - Honey Cup -- Oral - Nectar Teaspoon -- Oral - Nectar Cup -- Oral - Nectar Straw -- Oral - Thin Teaspoon -- Oral - Thin Cup Holding of bolus Oral - Thin Straw Holding of bolus;Premature spillage Oral - Puree WFL Oral - Mech Soft Impaired mastication Oral - Regular -- Oral - Multi-Consistency -- Oral - Pill Delayed oral transit Oral Phase - Comment --  CHL IP PHARYNGEAL PHASE 04/24/2016 Pharyngeal Phase Impaired Pharyngeal- Pudding Teaspoon -- Pharyngeal -- Pharyngeal- Pudding Cup -- Pharyngeal -- Pharyngeal- Honey Teaspoon -- Pharyngeal -- Pharyngeal- Honey Cup -- Pharyngeal -- Pharyngeal- Nectar Teaspoon -- Pharyngeal -- Pharyngeal- Nectar Cup -- Pharyngeal -- Pharyngeal- Nectar Straw -- Pharyngeal -- Pharyngeal- Thin Teaspoon -- Pharyngeal -- Pharyngeal- Thin Cup WFL Pharyngeal -- Pharyngeal- Thin Straw Penetration/Aspiration before swallow Pharyngeal Material enters airway, remains ABOVE vocal cords and not ejected out Pharyngeal- Puree WFL Pharyngeal -- Pharyngeal- Mechanical Soft WFL Pharyngeal -- Pharyngeal- Regular -- Pharyngeal -- Pharyngeal- Multi-consistency -- Pharyngeal -- Pharyngeal- Pill WFL Pharyngeal -- Pharyngeal Comment --  CHL IP CERVICAL ESOPHAGEAL PHASE 04/24/2016 Cervical Esophageal Phase WFL Pudding Teaspoon -- Pudding Cup -- Honey Teaspoon -- Honey Cup -- Nectar Teaspoon -- Nectar Cup -- Nectar Straw -- Thin Teaspoon -- Thin Cup -- Thin Straw -- Puree -- Mechanical Soft -- Regular -- Multi-consistency -- Pill -- Cervical Esophageal Comment -- No flowsheet data found. Germain Osgood, M.A. CCC-SLP [phone]  Germain Osgood 04/24/2016, 2:07 PM                  Subjective: DENIES ANY NEW COMPLAINTS.   Discharge Exam: Filed Vitals:   05/01/16 0819 05/01/16 1022  BP:  113/101  Pulse: 98   Temp:    Resp: 12    Filed Vitals:   05/01/16 0500 05/01/16 0819 05/01/16 0959 05/01/16 1022  BP: 132/98   113/101  Pulse: 98 98    Temp: 97.3 F (36.3 C)     TempSrc: Oral     Resp: 17 12    Height:      Weight: 58.196 kg (128 lb 4.8 oz)     SpO2: 100% 100% 100%     General: Pt is alert, awake, not in acute distress Cardiovascular: RRR, S1/S2 +, no rubs, no gallops Respiratory: CTA bilaterally, no wheezing, no rhonchi Abdominal: Soft, NT, ND, bowel sounds + Extremities: no edema, no cyanosis    The results of significant diagnostics from this hospitalization (including imaging, microbiology, ancillary and laboratory) are listed below for reference.     Microbiology: Recent Results (from the past 240 hour(s))  MRSA PCR Screening     Status: None   Collection Time: 04/21/16 11:00 PM  Result Value Ref Range Status   MRSA by PCR NEGATIVE NEGATIVE Final    Comment:        The GeneXpert MRSA Assay (FDA approved for NASAL specimens only), is one component of a comprehensive MRSA colonization surveillance program. It is not intended to diagnose MRSA infection nor to guide or monitor treatment for MRSA infections.      Labs: BNP (last 3 results)  Recent Labs  03/29/16 1718 04/06/16 1906 04/23/16 1459  BNP 73.0 140.0* [id number]   Basic Metabolic Panel:  Recent Labs Lab 04/25/16 0300  04/26/16 0320 04/27/16 0248  NA 141 140 142  K 4.1 4.9 4.1  CL 101 101 102  CO2 32 35* 34*  GLUCOSE 164* 190* 194*  BUN <5* 8 6  CREATININE 0.46* 0.50* 0.45*  CALCIUM 8.0* 8.7* 8.5*   Liver Function Tests:  Recent Labs Lab 04/26/16 0320 04/27/16 0248  AST 25 26  ALT 30 30  ALKPHOS 96 98  BILITOT 0.5 0.8  PROT 5.1* 5.0*  ALBUMIN 2.1* 2.2*   No results for input(s): LIPASE,  AMYLASE in the last 168 hours. No results for input(s): AMMONIA in the last 168 hours. CBC:  Recent Labs Lab 04/25/16 0300 04/26/16 0320 04/27/16 0248  WBC 9.3 10.0 8.4  HGB 8.0* 8.9* 8.6*  HCT 26.7* 29.4* 29.1*  MCV 89.6 89.4 90.4  PLT 213 264 215   Cardiac Enzymes: No results for input(s): CKTOTAL, CKMB, CKMBINDEX, TROPONINI in the last 168 hours. BNP: Invalid input(s): POCBNP CBG:  Recent Labs Lab 04/30/16 0746 04/30/16 1128 04/30/16 1625 04/30/16 2059 05/01/16 0728  GLUCAP 126* 170* 211* 192* 123*   D-Dimer No results for input(s): DDIMER in the last 72 hours. Hgb A1c No results for input(s): HGBA1C in the last 72 hours. Lipid Profile No results for input(s): CHOL, HDL, LDLCALC, TRIG, CHOLHDL, LDLDIRECT in the last 72 hours. Thyroid function studies No results for input(s): TSH, T4TOTAL, T3FREE, THYROIDAB in the last 72 hours.  Invalid input(s): FREET3 Anemia work up  Recent Labs  04/30/16 1527  VITAMINB12 607  FOLATE 15.9  FERRITIN 348*  TIBC 258  IRON 59  RETICCTPCT 3.1   Urinalysis    Component Value Date/Time   COLORURINE AMBER* 04/06/2016 1942   APPEARANCEUR CLOUDY* 04/06/2016 1942   LABSPEC 1.021 04/06/2016 1942   PHURINE 5.0 04/06/2016 1942   GLUCOSEU 250* 04/06/2016 1942   HGBUR MODERATE* 04/06/2016 1942   BILIRUBINUR MODERATE* 04/06/2016 1942   KETONESUR 40* 04/06/2016 1942   PROTEINUR NEGATIVE 04/06/2016 1942   UROBILINOGEN 0.2 01/21/2010 1944   NITRITE NEGATIVE 04/06/2016 1942   LEUKOCYTESUR NEGATIVE 04/06/2016 1942   Sepsis Labs Invalid input(s): PROCALCITONIN,  WBC,  LACTICIDVEN Microbiology Recent Results (from the past 240 hour(s))  MRSA PCR Screening     Status: None   Collection Time: 04/21/16 11:00 PM  Result Value Ref Range Status   MRSA by PCR NEGATIVE NEGATIVE Final    Comment:        The GeneXpert MRSA Assay (FDA approved for NASAL specimens only), is one component of a comprehensive MRSA  colonization surveillance program. It is not intended to diagnose MRSA infection nor to guide or monitor treatment for MRSA infections.      Time coordinating discharge: Over 30 minutes  SIGNED:   Hosie Poisson, MD  [facility] 05/01/2016, 10:49 AM Pager [phone]   If 7PM-7AM, please contact night-coverage [email or web address] Password [email or web address]

## 2016-05-01 NOTE — Progress Notes (Signed)
Occupational Therapy Treatment Patient Details Name: Kenneth Merritt MRN: [id number] DOB: [birth year] Today's Date: 05/01/2016    History of present illness pt is a 65 y/o male wit PMHx of Afib, CAD, BPG, PVD, DJD and Prostate CA admitted with SOB, Afib with RVR, general weakness and diarrhea.   OT comments  Pt making steady progress toward OT goals. Provided energy conservation handout and reviewed most pertinent strategies in detail with pt. Pt completed LB ADLs with min guard assist and HR increased into 120's - pt asymptomatic, but educated pt to take frequent rest breaks. Pt interested in being able to monitor O2 sats, HR, and BP at home and would like information on how to obtain pulse ox or BP cuff (will notify CM). Will continue to follow.   Follow Up Recommendations  Home health OT;Supervision/Assistance - 24 hour    Equipment Recommendations  Tub/shower seat    Recommendations for Other Services      Precautions / Restrictions Precautions Precautions: Fall Precaution Comments: O2 dep (3L at home) Restrictions Weight Bearing Restrictions: No       Mobility Bed Mobility               General bed mobility comments: Pt sitting EOB on OT arrival  Transfers Overall transfer level: Needs assistance Equipment used: Rolling walker (2 wheeled) Transfers: Sit to/from Stand Sit to Stand: Min guard         General transfer comment: Min guard for safety and O2 line management. VCs for safe hand placement    Balance Overall balance assessment: Needs assistance Sitting-balance support: No upper extremity supported;Feet supported Sitting balance-Leahy Scale: Good     Standing balance support: No upper extremity supported;During functional activity Standing balance-Leahy Scale: Fair Standing balance comment: able to maintain balance while standing for dressing tasks                   ADL Overall ADL's : Needs assistance/impaired Eating/Feeding: Set  up;Sitting       Upper Body Bathing: Supervision/ safety;Sitting   Lower Body Bathing: Supervison/ safety;Sit to/from stand;Cueing for compensatory techniques Lower Body Bathing Details (indicate cue type and reason): cues to take rest breaks - increase in HR Upper Body Dressing : Supervision/safety;Sitting   Lower Body Dressing: Supervision/safety;Sit to/from stand;Cueing for compensatory techniques Lower Body Dressing Details (indicate cue type and reason): cues for rest breaks- increase HR Toilet Transfer: Min guard;Cueing for safety;Ambulation;Regular Toilet;RW   Toileting- Water quality scientist and Hygiene: Min guard;Sit to/from stand       Functional mobility during ADLs: Min guard;Rolling walker General ADL Comments: Reviewed and provided pt with energy conservation strategies. Provided education about DME, pulse oximeter/BP cuff to measure O2 and HR at home.       Vision                     Perception     Praxis      Cognition   Behavior During Therapy: WFL for tasks assessed/performed Overall Cognitive Status: Within Functional Limits for tasks assessed                       Extremity/Trunk Assessment               Exercises     Shoulder Instructions       General Comments      Pertinent Vitals/ Pain       Pain Assessment: 0-10 Pain Score: 2  Pain Location:  R mid back Pain Descriptors / Indicators: Sore Pain Intervention(s): Monitored during session;Limited activity within patient's tolerance;Repositioned  Home Living                                          Prior Functioning/Environment              Frequency Min 2X/week     Progress Toward Goals  OT Goals(current goals can now be found in the care plan section)  Progress towards OT goals: Progressing toward goals  Acute Rehab OT Goals Patient Stated Goal: want to catch my breath, home OT Goal Formulation: With patient Time For Goal  Achievement: 05/08/16 Potential to Achieve Goals: Good ADL Goals Pt Will Perform Grooming: with supervision;standing Pt Will Perform Upper Body Bathing: with set-up;sitting Pt Will Perform Lower Body Bathing: with supervision;sit to/from stand Pt Will Perform Upper Body Dressing: with set-up;sitting Pt Will Perform Lower Body Dressing: with supervision;sit to/from stand Pt Will Transfer to Toilet: with supervision;ambulating;regular height toilet;grab bars Pt Will Perform Toileting - Clothing Manipulation and hygiene: with supervision;sit to/from stand Additional ADL Goal #1: Pt will independently incorporate energy conservation activities into ADL taks with min cues   Plan Discharge plan remains appropriate    Co-evaluation                 End of Session Equipment Utilized During Treatment: Gait belt;Oxygen;Rolling walker   Activity Tolerance Patient limited by fatigue   Patient Left in bed;with call bell/phone within reach   Nurse Communication Mobility status        Time: [id number] OT Time Calculation (min): 22 min  Charges: OT General Charges $OT Visit: 1 Procedure OT Treatments $Self Care/Home Management : 8-22 mins  Redmond Baseman, OTR/L Pager: [phone] 05/01/2016, 2:06 PM

## 2016-05-02 ENCOUNTER — Encounter: Payer: Self-pay | Admitting: *Deleted

## 2016-05-02 ENCOUNTER — Other Ambulatory Visit: Payer: Self-pay | Admitting: *Deleted

## 2016-05-02 NOTE — Patient Outreach (Signed)
Call to patient home for transition of care call. No answer, left VM requesting call back. Royetta Crochet. Laymond Purser, RN, BSN, [place] [phone]

## 2016-05-05 ENCOUNTER — Telehealth: Payer: Self-pay

## 2016-05-05 ENCOUNTER — Ambulatory Visit: Payer: Self-pay | Admitting: *Deleted

## 2016-05-05 ENCOUNTER — Other Ambulatory Visit: Payer: Self-pay | Admitting: *Deleted

## 2016-05-05 NOTE — Telephone Encounter (Signed)
LMTCB

## 2016-05-05 NOTE — Patient Outreach (Signed)
[facility]) Care Management Transition of Care 05/05/2016  KYLEE UTECHT July 24, 1951 [id number]  Transition of Care Outreach Attempt #2, unsuccessful. Unable to reach Mr. Kaplowitz at his listed primary number or next of kin listed number, discharged from the hospital on 05/01/16. HIPPA compliant voice message left requesting return call.   Given the nature of Mr. Kerins recent hospitalization -Atrial fibrillation with RVR, CHADS2 score 4, acute metabolic encephalopathy, acute hypoxic respiratory failure with exacerbation of COPD and possible aspiration, guarded condition at discharge, recommendation for numerous follow up appointments and procedures (PCP f/u 1-2 weeks, bmp/bcb 1 week, PET scan/cardiology/anemia follow up inside 2 weeks), and our inability to make contact with Mr. Dirksen since his hospital discharge, I reached out to his primary care provider by phone to notify him of our inability to reach Mr. Millhouse for follow up. The office staff member stated she would notify Dr. Joselyn Arrow nurse and would reach out to Mr. Yerk to try and schedule a post hospital follow up appointment.   Plan: I will update Mr. Krugman primary community case Freight forwarder.    [facility]  [phone]

## 2016-05-06 ENCOUNTER — Other Ambulatory Visit: Payer: Self-pay | Admitting: *Deleted

## 2016-05-06 NOTE — Telephone Encounter (Signed)
Called and spoke with patient's wife, scheduled patient to see Dr. Lamonte Sakai for Hospital Follow up. Nothing further needed.

## 2016-05-06 NOTE — Patient Outreach (Signed)
Transition of care call attempt. No answer, left VM requesting call back. Royetta Crochet. Laymond Purser, RN, BSN, [place] [phone]

## 2016-05-08 ENCOUNTER — Encounter: Payer: Self-pay | Admitting: *Deleted

## 2016-05-08 ENCOUNTER — Other Ambulatory Visit: Payer: Self-pay | Admitting: *Deleted

## 2016-05-08 NOTE — Patient Outreach (Addendum)
Call to patient home number, no answer, left voicemail requesting call back. Will send letter as this is at least 3rd contact attempt with no answer or call back from patient.  Royetta Crochet. Laymond Purser, RN, BSN, [employer] [phone]) Business Cell  [phone]) Toll Free Office

## 2016-05-08 NOTE — Patient Outreach (Signed)
Verified with L. Lajuana Ripple, Therapist, sports, home care liaison that patient is getting needed home care services. Kenneth Merritt. Laymond Purser, RN, BSN, [place] [phone]

## 2016-05-10 ENCOUNTER — Emergency Department (HOSPITAL_COMMUNITY): Payer: Medicare Other

## 2016-05-10 ENCOUNTER — Encounter (HOSPITAL_COMMUNITY): Payer: Self-pay

## 2016-05-10 ENCOUNTER — Inpatient Hospital Stay (HOSPITAL_COMMUNITY)
Admission: EM | Admit: 2016-05-10 | Discharge: 2016-05-14 | DRG: 871 | Disposition: A | Discharge status: Home Health | Payer: Medicare Other | Attending: Internal Medicine | Admitting: Internal Medicine

## 2016-05-10 DIAGNOSIS — R6520 Severe sepsis without septic shock: Secondary | ICD-10-CM | POA: Diagnosis present

## 2016-05-10 DIAGNOSIS — Z79899 Other long term (current) drug therapy: Secondary | ICD-10-CM | POA: Diagnosis not present

## 2016-05-10 DIAGNOSIS — T17998D Other foreign object in respiratory tract, part unspecified causing other injury, subsequent encounter: Secondary | ICD-10-CM | POA: Diagnosis not present

## 2016-05-10 DIAGNOSIS — M1990 Unspecified osteoarthritis, unspecified site: Secondary | ICD-10-CM | POA: Diagnosis present

## 2016-05-10 DIAGNOSIS — D649 Anemia, unspecified: Secondary | ICD-10-CM | POA: Diagnosis present

## 2016-05-10 DIAGNOSIS — A419 Sepsis, unspecified organism: Secondary | ICD-10-CM | POA: Diagnosis present

## 2016-05-10 DIAGNOSIS — E785 Hyperlipidemia, unspecified: Secondary | ICD-10-CM | POA: Diagnosis present

## 2016-05-10 DIAGNOSIS — K219 Gastro-esophageal reflux disease without esophagitis: Secondary | ICD-10-CM | POA: Diagnosis present

## 2016-05-10 DIAGNOSIS — I5032 Chronic diastolic (congestive) heart failure: Secondary | ICD-10-CM | POA: Diagnosis present

## 2016-05-10 DIAGNOSIS — E43 Unspecified severe protein-calorie malnutrition: Secondary | ICD-10-CM | POA: Diagnosis present

## 2016-05-10 DIAGNOSIS — J449 Chronic obstructive pulmonary disease, unspecified: Secondary | ICD-10-CM | POA: Diagnosis present

## 2016-05-10 DIAGNOSIS — T17908D Unspecified foreign body in respiratory tract, part unspecified causing other injury, subsequent encounter: Secondary | ICD-10-CM

## 2016-05-10 DIAGNOSIS — J9622 Acute and chronic respiratory failure with hypercapnia: Secondary | ICD-10-CM

## 2016-05-10 DIAGNOSIS — R000 Tachycardia, unspecified: Secondary | ICD-10-CM | POA: Diagnosis present

## 2016-05-10 DIAGNOSIS — I2510 Atherosclerotic heart disease of native coronary artery without angina pectoris: Secondary | ICD-10-CM | POA: Diagnosis present

## 2016-05-10 DIAGNOSIS — I480 Paroxysmal atrial fibrillation: Secondary | ICD-10-CM | POA: Diagnosis present

## 2016-05-10 DIAGNOSIS — Z8546 Personal history of malignant neoplasm of prostate: Secondary | ICD-10-CM

## 2016-05-10 DIAGNOSIS — J441 Chronic obstructive pulmonary disease with (acute) exacerbation: Secondary | ICD-10-CM | POA: Diagnosis present

## 2016-05-10 DIAGNOSIS — J9610 Chronic respiratory failure, unspecified whether with hypoxia or hypercapnia: Secondary | ICD-10-CM | POA: Diagnosis present

## 2016-05-10 DIAGNOSIS — R0602 Shortness of breath: Secondary | ICD-10-CM | POA: Diagnosis present

## 2016-05-10 DIAGNOSIS — Z7952 Long term (current) use of systemic steroids: Secondary | ICD-10-CM

## 2016-05-10 DIAGNOSIS — M546 Pain in thoracic spine: Secondary | ICD-10-CM | POA: Diagnosis present

## 2016-05-10 DIAGNOSIS — I252 Old myocardial infarction: Secondary | ICD-10-CM

## 2016-05-10 DIAGNOSIS — J9621 Acute and chronic respiratory failure with hypoxia: Secondary | ICD-10-CM | POA: Diagnosis present

## 2016-05-10 DIAGNOSIS — J440 Chronic obstructive pulmonary disease with acute lower respiratory infection: Secondary | ICD-10-CM | POA: Diagnosis present

## 2016-05-10 DIAGNOSIS — R1310 Dysphagia, unspecified: Secondary | ICD-10-CM | POA: Diagnosis present

## 2016-05-10 DIAGNOSIS — J189 Pneumonia, unspecified organism: Secondary | ICD-10-CM | POA: Diagnosis present

## 2016-05-10 DIAGNOSIS — E1151 Type 2 diabetes mellitus with diabetic peripheral angiopathy without gangrene: Secondary | ICD-10-CM | POA: Diagnosis present

## 2016-05-10 DIAGNOSIS — Z7951 Long term (current) use of inhaled steroids: Secondary | ICD-10-CM

## 2016-05-10 DIAGNOSIS — Z9981 Dependence on supplemental oxygen: Secondary | ICD-10-CM

## 2016-05-10 DIAGNOSIS — G894 Chronic pain syndrome: Secondary | ICD-10-CM | POA: Diagnosis present

## 2016-05-10 DIAGNOSIS — E876 Hypokalemia: Secondary | ICD-10-CM | POA: Diagnosis present

## 2016-05-10 DIAGNOSIS — R7989 Other specified abnormal findings of blood chemistry: Secondary | ICD-10-CM | POA: Diagnosis not present

## 2016-05-10 DIAGNOSIS — Z881 Allergy status to other antibiotic agents status: Secondary | ICD-10-CM | POA: Diagnosis not present

## 2016-05-10 DIAGNOSIS — Z7901 Long term (current) use of anticoagulants: Secondary | ICD-10-CM

## 2016-05-10 DIAGNOSIS — Z87891 Personal history of nicotine dependence: Secondary | ICD-10-CM | POA: Diagnosis not present

## 2016-05-10 DIAGNOSIS — R197 Diarrhea, unspecified: Secondary | ICD-10-CM | POA: Diagnosis not present

## 2016-05-10 DIAGNOSIS — T17908A Unspecified foreign body in respiratory tract, part unspecified causing other injury, initial encounter: Secondary | ICD-10-CM | POA: Diagnosis present

## 2016-05-10 DIAGNOSIS — Z7984 Long term (current) use of oral hypoglycemic drugs: Secondary | ICD-10-CM

## 2016-05-10 DIAGNOSIS — Z95828 Presence of other vascular implants and grafts: Secondary | ICD-10-CM

## 2016-05-10 DIAGNOSIS — Y95 Nosocomial condition: Secondary | ICD-10-CM | POA: Diagnosis present

## 2016-05-10 DIAGNOSIS — Z8249 Family history of ischemic heart disease and other diseases of the circulatory system: Secondary | ICD-10-CM

## 2016-05-10 DIAGNOSIS — E440 Moderate protein-calorie malnutrition: Secondary | ICD-10-CM | POA: Diagnosis present

## 2016-05-10 DIAGNOSIS — I214 Non-ST elevation (NSTEMI) myocardial infarction: Secondary | ICD-10-CM | POA: Diagnosis present

## 2016-05-10 DIAGNOSIS — Z8679 Personal history of other diseases of the circulatory system: Secondary | ICD-10-CM

## 2016-05-10 DIAGNOSIS — Z8042 Family history of malignant neoplasm of prostate: Secondary | ICD-10-CM | POA: Diagnosis not present

## 2016-05-10 DIAGNOSIS — E119 Type 2 diabetes mellitus without complications: Secondary | ICD-10-CM

## 2016-05-10 DIAGNOSIS — J9611 Chronic respiratory failure with hypoxia: Secondary | ICD-10-CM | POA: Diagnosis not present

## 2016-05-10 LAB — I-STAT ARTERIAL BLOOD GAS, ED
ACID-BASE EXCESS: 3 mmol/L — AB (ref 0.0–2.0)
BICARBONATE: 30 meq/L — AB (ref 20.0–24.0)
O2 SAT: 100 %
PCO2 ART: 58.9 mmHg — AB (ref 35.0–45.0)
PO2 ART: 351 mmHg — AB (ref 80.0–100.0)
Patient temperature: 101.4
TCO2: 32 mmol/L (ref 0–100)
pH, Arterial: 7.323 — ABNORMAL LOW (ref 7.350–7.450)

## 2016-05-10 LAB — COMPREHENSIVE METABOLIC PANEL
ALBUMIN: 2.4 g/dL — AB (ref 3.5–5.0)
ALT: 46 U/L (ref 17–63)
ANION GAP: 12 (ref 5–15)
AST: 28 U/L (ref 15–41)
Alkaline Phosphatase: 83 U/L (ref 38–126)
BILIRUBIN TOTAL: 1.9 mg/dL — AB (ref 0.3–1.2)
BUN: <5 mg/dL — ABNORMAL LOW (ref 6–20)
CALCIUM: 7.9 mg/dL — AB (ref 8.9–10.3)
CO2: 27 mmol/L (ref 22–32)
Chloride: 96 mmol/L — ABNORMAL LOW (ref 101–111)
Creatinine, Ser: 0.68 mg/dL (ref 0.61–1.24)
GLUCOSE: 94 mg/dL (ref 65–99)
Potassium: 3.4 mmol/L — ABNORMAL LOW (ref 3.5–5.1)
Sodium: 135 mmol/L (ref 135–145)
TOTAL PROTEIN: 5.7 g/dL — AB (ref 6.5–8.1)

## 2016-05-10 LAB — URINALYSIS, ROUTINE W REFLEX MICROSCOPIC
GLUCOSE, UA: NEGATIVE mg/dL
Ketones, ur: >80 mg/dL — AB
LEUKOCYTES UA: NEGATIVE
Nitrite: NEGATIVE
PH: 6 (ref 5.0–8.0)
Protein, ur: 30 mg/dL — AB
Specific Gravity, Urine: 1.021 (ref 1.005–1.030)

## 2016-05-10 LAB — CBC WITH DIFFERENTIAL/PLATELET
BASOS PCT: 0 %
Basophils Absolute: 0 10*3/uL (ref 0.0–0.1)
EOS PCT: 0 %
Eosinophils Absolute: 0 10*3/uL (ref 0.0–0.7)
HEMATOCRIT: 34.9 % — AB (ref 39.0–52.0)
Hemoglobin: 11 g/dL — ABNORMAL LOW (ref 13.0–17.0)
LYMPHS ABS: 1.3 10*3/uL (ref 0.7–4.0)
LYMPHS PCT: 9 %
MCH: 28.8 pg (ref 26.0–34.0)
MCHC: 31.5 g/dL (ref 30.0–36.0)
MCV: 91.4 fL (ref 78.0–100.0)
MONOS PCT: 8 %
Monocytes Absolute: 1.1 10*3/uL — ABNORMAL HIGH (ref 0.1–1.0)
NEUTROS ABS: 11.9 10*3/uL — AB (ref 1.7–7.7)
Neutrophils Relative %: 83 %
Platelets: 226 10*3/uL (ref 150–400)
RBC: 3.82 MIL/uL — ABNORMAL LOW (ref 4.22–5.81)
RDW: 16.4 % — AB (ref 11.5–15.5)
WBC: 14.3 10*3/uL — ABNORMAL HIGH (ref 4.0–10.5)

## 2016-05-10 LAB — LACTIC ACID, PLASMA
Lactic Acid, Venous: 1.2 mmol/L (ref 0.5–1.9)
Lactic Acid, Venous: 1 mmol/L (ref 0.5–1.9)

## 2016-05-10 LAB — TROPONIN I
TROPONIN I: 0.59 ng/mL — AB (ref <0.03)
TROPONIN I: 0.49 ng/mL — AB (ref <0.03)

## 2016-05-10 LAB — URINE MICROSCOPIC-ADD ON

## 2016-05-10 LAB — I-STAT CG4 LACTIC ACID, ED: LACTIC ACID, VENOUS: 1.51 mmol/L (ref 0.5–1.9)

## 2016-05-10 LAB — GLUCOSE, CAPILLARY: GLUCOSE-CAPILLARY: 172 mg/dL — AB (ref 65–99)

## 2016-05-10 LAB — BRAIN NATRIURETIC PEPTIDE: B Natriuretic Peptide: 68.7 pg/mL (ref 0.0–100.0)

## 2016-05-10 LAB — PROCALCITONIN: Procalcitonin: 0.37 ng/mL

## 2016-05-10 MED ORDER — METHYLPREDNISOLONE SODIUM SUCC 125 MG IJ SOLR
60.0000 mg | Freq: Three times a day (TID) | INTRAMUSCULAR | Status: DC
  Administered 2016-05-10 – 2016-05-12 (×5): 60 mg via INTRAVENOUS
  Filled 2016-05-10 (×5): qty 2

## 2016-05-10 MED ORDER — SODIUM CHLORIDE 0.9 % IV BOLUS (SEPSIS)
500.0000 mL | Freq: Once | INTRAVENOUS | Status: AC
Start: 2016-05-10 — End: 2016-05-10
  Administered 2016-05-10: 500 mL via INTRAVENOUS

## 2016-05-10 MED ORDER — FAMOTIDINE 20 MG PO TABS
20.0000 mg | ORAL_TABLET | Freq: Two times a day (BID) | ORAL | Status: DC
  Administered 2016-05-10 – 2016-05-14 (×8): 20 mg via ORAL
  Filled 2016-05-10 (×8): qty 1

## 2016-05-10 MED ORDER — IPRATROPIUM-ALBUTEROL 0.5-2.5 (3) MG/3ML IN SOLN
3.0000 mL | RESPIRATORY_TRACT | Status: DC | PRN
  Administered 2016-05-10: 3 mL via RESPIRATORY_TRACT
  Filled 2016-05-10: qty 3

## 2016-05-10 MED ORDER — INSULIN ASPART 100 UNIT/ML ~~LOC~~ SOLN
0.0000 [IU] | SUBCUTANEOUS | Status: DC
  Administered 2016-05-10 – 2016-05-11 (×3): 2 [IU] via SUBCUTANEOUS

## 2016-05-10 MED ORDER — GABAPENTIN 300 MG PO CAPS
300.0000 mg | ORAL_CAPSULE | Freq: Three times a day (TID) | ORAL | Status: DC
  Administered 2016-05-10 – 2016-05-14 (×11): 300 mg via ORAL
  Filled 2016-05-10 (×11): qty 1

## 2016-05-10 MED ORDER — METRONIDAZOLE IN NACL 5-0.79 MG/ML-% IV SOLN
500.0000 mg | Freq: Three times a day (TID) | INTRAVENOUS | Status: DC
  Filled 2016-05-10: qty 100

## 2016-05-10 MED ORDER — HYDROCODONE-ACETAMINOPHEN 5-325 MG PO TABS
1.0000 | ORAL_TABLET | Freq: Four times a day (QID) | ORAL | Status: DC | PRN
  Administered 2016-05-11: 2 via ORAL
  Filled 2016-05-10: qty 2

## 2016-05-10 MED ORDER — METHYLPREDNISOLONE SODIUM SUCC 125 MG IJ SOLR
125.0000 mg | Freq: Once | INTRAMUSCULAR | Status: AC
  Administered 2016-05-10: 125 mg via INTRAVENOUS
  Filled 2016-05-10: qty 2

## 2016-05-10 MED ORDER — ALPRAZOLAM 0.25 MG PO TABS
0.2500 mg | ORAL_TABLET | Freq: Three times a day (TID) | ORAL | Status: DC | PRN
  Administered 2016-05-10 – 2016-05-13 (×5): 0.25 mg via ORAL
  Filled 2016-05-10 (×5): qty 1

## 2016-05-10 MED ORDER — GUAIFENESIN ER 600 MG PO TB12
600.0000 mg | ORAL_TABLET | Freq: Two times a day (BID) | ORAL | Status: DC
  Administered 2016-05-10 – 2016-05-14 (×5): 600 mg via ORAL
  Filled 2016-05-10 (×8): qty 1

## 2016-05-10 MED ORDER — MAGNESIUM SULFATE 2 GM/50ML IV SOLN
2.0000 g | Freq: Once | INTRAVENOUS | Status: AC
  Administered 2016-05-10: 2 g via INTRAVENOUS
  Filled 2016-05-10: qty 50

## 2016-05-10 MED ORDER — LEVALBUTEROL HCL 1.25 MG/0.5ML IN NEBU
1.2500 mg | INHALATION_SOLUTION | Freq: Four times a day (QID) | RESPIRATORY_TRACT | Status: DC
  Administered 2016-05-10 – 2016-05-11 (×3): 1.25 mg via RESPIRATORY_TRACT
  Filled 2016-05-10 (×2): qty 0.5

## 2016-05-10 MED ORDER — VANCOMYCIN HCL IN DEXTROSE 1-5 GM/200ML-% IV SOLN
1000.0000 mg | INTRAVENOUS | Status: AC
  Administered 2016-05-10: 1000 mg via INTRAVENOUS
  Filled 2016-05-10: qty 200

## 2016-05-10 MED ORDER — PIPERACILLIN-TAZOBACTAM 3.375 G IVPB 30 MIN
3.3750 g | Freq: Once | INTRAVENOUS | Status: AC
  Administered 2016-05-10: 3.375 g via INTRAVENOUS
  Filled 2016-05-10: qty 50

## 2016-05-10 MED ORDER — APIXABAN 5 MG PO TABS
5.0000 mg | ORAL_TABLET | Freq: Two times a day (BID) | ORAL | Status: DC
  Administered 2016-05-10 – 2016-05-14 (×8): 5 mg via ORAL
  Filled 2016-05-10 (×8): qty 1

## 2016-05-10 MED ORDER — NITROGLYCERIN 0.4 MG SL SUBL: 0.4000 mg | SUBLINGUAL_TABLET | SUBLINGUAL | Status: DC | PRN

## 2016-05-10 MED ORDER — VANCOMYCIN HCL 10 G IV SOLR: 2000.0000 mg | Freq: Once | INTRAVENOUS | Status: DC

## 2016-05-10 MED ORDER — SODIUM CHLORIDE 0.9% FLUSH
3.0000 mL | Freq: Two times a day (BID) | INTRAVENOUS | Status: DC
  Administered 2016-05-10 – 2016-05-13 (×7): 3 mL via INTRAVENOUS

## 2016-05-10 MED ORDER — ASPIRIN EC 325 MG PO TBEC
325.0000 mg | DELAYED_RELEASE_TABLET | Freq: Once | ORAL | Status: AC
  Administered 2016-05-10: 325 mg via ORAL
  Filled 2016-05-10: qty 1

## 2016-05-10 MED ORDER — VANCOMYCIN 50 MG/ML ORAL SOLUTION
250.0000 mg | Freq: Four times a day (QID) | ORAL | Status: DC
  Administered 2016-05-10 – 2016-05-12 (×6): 250 mg via ORAL
  Filled 2016-05-10 (×8): qty 5

## 2016-05-10 MED ORDER — DILTIAZEM HCL ER COATED BEADS 240 MG PO CP24
240.0000 mg | ORAL_CAPSULE | Freq: Every day | ORAL | Status: DC
  Administered 2016-05-11 – 2016-05-14 (×4): 240 mg via ORAL
  Filled 2016-05-10 (×4): qty 1

## 2016-05-10 MED ORDER — IPRATROPIUM BROMIDE 0.02 % IN SOLN
0.5000 mg | Freq: Four times a day (QID) | RESPIRATORY_TRACT | Status: DC
  Administered 2016-05-10 – 2016-05-13 (×13): 0.5 mg via RESPIRATORY_TRACT
  Filled 2016-05-10 (×12): qty 2.5

## 2016-05-10 NOTE — Progress Notes (Signed)
Md called for clarification of nasal culture.  Md unsure.  To be addressed in am.  Will continue to monitor Saunders Revel T

## 2016-05-10 NOTE — Progress Notes (Signed)
RT attempted to put BIPAP on patient. Patient refused. RT told RN and patient to call if patient changed his mind. Vital signs stable at this time. RT will continue to monitor.

## 2016-05-10 NOTE — ED Notes (Signed)
[facility] contacted to call Code Sepsis 

## 2016-05-10 NOTE — ED Provider Notes (Signed)
CSN: [id number]     Arrival date & time 05/10/16  1527 History   First MD Initiated Contact with Patient 05/10/16 1535     Chief Complaint  Patient presents with  . Shortness of Breath   (Consider location/radiation/quality/duration/timing/severity/associated sxs/prior Treatment) Patient is a 65 y.o. male presenting with shortness of breath. The history is provided by the patient.  Shortness of Breath Severity:  Severe Onset quality:  Gradual Duration:  1 week Timing:  Constant Progression:  Worsening Chronicity:  New Relieved by:  Nothing Ineffective treatments:  Position changes and inhaler Associated symptoms: chest pain and wheezing   Associated symptoms: no abdominal pain, no cough, no fever, no neck pain, no rash, no sore throat and no vomiting     Past Medical History  Diagnosis Date  . GERD (gastroesophageal reflux disease)   . COPD (chronic obstructive pulmonary disease) ([place])     severe by PFTs August 2010  . 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 Atrial fibrillation ([facility])     post-op a-fib. 3/11. post Aortobifem , /  Atrial fibrillation from nebulizer treatment  May, 2012, while hospitalized  . Renal artery stenosis ([place])      presumably corrected at time of aortobifem surgery March, 2011  . Tobacco user      stop smoking  . Pneumothorax      2011  before aortobifemoral surgery,  resolved  . Peripheral vascular disease, unspecified ([place])     bilateral intermittent claudication  . CAD (coronary artery disease), native coronary artery     catheterization 06/2009.Marland KitchenMarland Kitchen70% proximal LAD and 30% proximal RCA. normal LV function, no intervention planned prior to surgery for aorta. LV normal function  by catheter 8/10.   [name] Dyslipidemia   . DJD (degenerative joint disease)   . Ejection fraction     60%, echo, May, 2012, rapid atrial fib at the time  . Cancer [facility]) 2013    Prostate  Radiation  . Myocardial infarction ([place]) 2011  . On home O2     2L [place]  . Chronic thoracic back pain   . Chronic pain syndrome    Past Surgical History  Procedure Laterality Date  . Abdominal aortic aneurysm repair  01/11/2010  . Pr vein bypass graft,aorto-fem-pop    . Inguinal hernia repair      RIGHT   Family History  Problem Relation Age of Onset  . Cancer Father     prostate  . Heart attack Father    Social History  Substance Use Topics  . Smoking status: Former Smoker -- 2.00 packs/day for 40 years    Types: Cigarettes    Quit date: 01/11/2010  . Smokeless tobacco: Never Used  . Alcohol Use: No    Review of Systems  Constitutional: Negative for fever and chills.  HENT: Negative for congestion and sore throat.   Eyes: Negative for pain.  Respiratory: Positive for shortness of breath and wheezing. Negative for cough.   Cardiovascular: Positive for chest pain. Negative for palpitations and leg swelling.  Gastrointestinal: Negative for nausea, vomiting, abdominal pain and diarrhea.  Endocrine: Negative.   Genitourinary: Negative for flank pain.  Musculoskeletal: Negative for back pain and neck pain.  Skin: Negative for rash.  Allergic/Immunologic: Negative.   Neurological: Negative for dizziness, syncope and light-headedness.  Psychiatric/Behavioral: Negative for confusion.   Allergies  Levaquin  Home Medications  Prior to Admission medications   Medication Sig Start Date End Date Taking? Authorizing Provider  albuterol (PROVENTIL HFA;VENTOLIN HFA) 108 (90 BASE) MCG/ACT inhaler Inhale 2 puffs into the lungs every 4 (four) hours as needed for wheezing or shortness of breath. 09/19/15  Yes Donne [name], MD  albuterol (PROVENTIL) (2.5 MG/3ML) 0.083% nebulizer solution Take 3 mLs (2.5 mg total) by nebulization every 4 (four) hours as needed for wheezing or shortness of breath. 03/15/15  Yes [name], MD  ALPRAZolam [name]) 0.25 MG tablet Take 0.25 mg by mouth 3  (three) times daily as needed for anxiety or sleep.  12/17/15  Yes Historical Provider, MD  atorvastatin (LIPITOR) 20 MG tablet Take 1 tablet (20 mg total) by mouth daily at 6 PM. 05/01/16  Yes Hosie Poisson, MD  diltiazem (CARDIZEM CD) 240 MG 24 hr capsule Take 1 capsule (240 mg total) by mouth daily. 05/01/16  Yes Hosie Poisson, MD  ELIQUIS 5 MG TABS tablet TAKE 1 TABLET BY MOUTH TWICE A DAY 02/11/16  Yes Satira Sark, MD  furosemide (LASIX) 20 MG tablet Take 1 tablet (20 mg total) by mouth daily. For 3days then as needed for swelling/weight gain of >2lbs overnight or 4-5lbs in 1 week 04/15/16  Yes Domenic Polite, MD  gabapentin (NEURONTIN) 300 MG capsule Take 300 mg by mouth 3 (three) times daily.   Yes Historical Provider, MD  HYDROcodone-acetaminophen (NORCO/VICODIN) 5-325 MG tablet Take 1-2 tablets by mouth every 6 (six) hours as needed for moderate pain or severe pain. 04/15/16  Yes Domenic Polite, MD  ipratropium (ATROVENT) 0.02 % nebulizer solution Take 0.5 mg by nebulization every 4 (four) hours as needed for wheezing or shortness of breath.   Yes Historical Provider, MD  metFORMIN (GLUCOPHAGE) 1000 MG tablet Take 1,000 mg by mouth daily with breakfast.   Yes Historical Provider, MD  omeprazole (PRILOSEC) 20 MG capsule Take 20 mg by mouth every morning.  04/21/11  Yes Historical Provider, MD  polyethylene glycol (MIRALAX / GLYCOLAX) packet Take 17 g by mouth daily. 05/01/16  Yes Hosie Poisson, MD  feeding supplement, ENSURE ENLIVE, (ENSURE ENLIVE) LIQD Take 237 mLs by mouth 2 (two) times daily between meals. Patient not taking: Reported on 05/10/2016 05/01/16   Hosie Poisson, MD  food thickener (THICK IT) POWD As needed Patient not taking: Reported on 05/10/2016 05/01/16   Hosie Poisson, MD  Multiple Vitamin (MULTIVITAMIN WITH MINERALS) TABS tablet Take 1 tablet by mouth daily. Patient not taking: Reported on 05/10/2016 05/01/16   Hosie Poisson, MD  nitroGLYCERIN (NITROSTAT) 0.4 MG SL tablet Place 1 tablet (0.4  mg total) under the tongue every 5 (five) minutes x 3 doses as needed for chest pain. 06/11/15   [name], MD   BP 112/66 mmHg  Pulse 89  Temp(Src) 97.6 F (36.4 C) (Oral)  Resp 18  Ht 5\' 4"  (1.626 m)  Wt 57.063 kg  BMI 21.58 kg/m2  SpO2 100% Physical Exam  Constitutional: He is oriented to person, place, and time. He appears well-developed and well-nourished. He appears distressed.  HENT:  Head: Normocephalic and atraumatic.  Eyes: Conjunctivae and EOM are normal. Pupils are equal, round, and reactive to light.  Neck: Normal range of motion. Neck supple.  Cardiovascular: Normal rate, regular rhythm, normal heart sounds and intact distal pulses.   Pulmonary/Chest: Tachypnea noted. He is in respiratory distress. He has wheezes. He has rales.  Abdominal: Soft. Bowel sounds are normal. There is no tenderness.  Musculoskeletal: Normal range  of motion.  Neurological: He is alert and oriented to person, place, and time. He has normal reflexes. No cranial nerve deficit.  Skin: Skin is warm and dry.    ED Course  Procedures (including critical care time) Labs Review Labs Reviewed  URINE CULTURE - Abnormal; Notable for the following:    Culture <10,000 COLONIES/mL INSIGNIFICANT GROWTH (*)    All other components within normal limits  CBC WITH DIFFERENTIAL/PLATELET - Abnormal; Notable for the following:    WBC 14.3 (*)    RBC 3.82 (*)    Hemoglobin 11.0 (*)    HCT 34.9 (*)    RDW 16.4 (*)    Neutro Abs 11.9 (*)    Monocytes Absolute 1.1 (*)    All other components within normal limits  COMPREHENSIVE METABOLIC PANEL - Abnormal; Notable for the following:    Potassium 3.4 (*)    Chloride 96 (*)    BUN <5 (*)    Calcium 7.9 (*)    Total Protein 5.7 (*)    Albumin 2.4 (*)    Total Bilirubin 1.9 (*)    All other components within normal limits  TROPONIN I - Abnormal; Notable for the following:    Troponin I 0.59 (*)    All other components within normal limits  URINALYSIS,  ROUTINE W REFLEX MICROSCOPIC (NOT AT [facility]) - Abnormal; Notable for the following:    Color, Urine AMBER (*)    Hgb urine dipstick TRACE (*)    Bilirubin Urine SMALL (*)    Ketones, ur >80 (*)    Protein, ur 30 (*)    All other components within normal limits  URINE MICROSCOPIC-ADD ON - Abnormal; Notable for the following:    Squamous Epithelial / LPF 0-5 (*)    Bacteria, UA RARE (*)    All other components within normal limits  TROPONIN I - Abnormal; Notable for the following:    Troponin I 0.49 (*)    All other components within normal limits  COMPREHENSIVE METABOLIC PANEL - Abnormal; Notable for the following:    Sodium 134 (*)    Chloride 95 (*)    Glucose, Bld 147 (*)    BUN <5 (*)    Creatinine, Ser 0.56 (*)    Calcium 7.6 (*)    Total Protein 5.0 (*)    Albumin 1.9 (*)    Total Bilirubin 1.4 (*)    All other components within normal limits  CBC - Abnormal; Notable for the following:    WBC 12.0 (*)    RBC 3.39 (*)    Hemoglobin 9.5 (*)    HCT 30.3 (*)    RDW 15.9 (*)    All other components within normal limits  PROTIME-INR - Abnormal; Notable for the following:    Prothrombin Time 23.8 (*)    INR 2.15 (*)    All other components within normal limits  GLUCOSE, CAPILLARY - Abnormal; Notable for the following:    Glucose-Capillary 172 (*)    All other components within normal limits  TROPONIN I - Abnormal; Notable for the following:    Troponin I 0.38 (*)    All other components within normal limits  TROPONIN I - Abnormal; Notable for the following:    Troponin I 0.22 (*)    All other components within normal limits  TROPONIN I - Abnormal; Notable for the following:    Troponin I 0.75 (*)    All other components within normal limits  GLUCOSE, CAPILLARY -  Abnormal; Notable for the following:    Glucose-Capillary 178 (*)    All other components within normal limits  GLUCOSE, CAPILLARY - Abnormal; Notable for the following:    Glucose-Capillary 182 (*)    All  other components within normal limits  GLUCOSE, CAPILLARY - Abnormal; Notable for the following:    Glucose-Capillary 153 (*)    All other components within normal limits  GLUCOSE, CAPILLARY - Abnormal; Notable for the following:    Glucose-Capillary 182 (*)    All other components within normal limits  I-STAT ARTERIAL BLOOD GAS, ED - Abnormal; Notable for the following:    pH, Arterial 7.323 (*)    pCO2 arterial 58.9 (*)    pO2, Arterial 351.0 (*)    Bicarbonate 30.0 (*)    Acid-Base Excess 3.0 (*)    All other components within normal limits  CULTURE, BLOOD (ROUTINE X 2)  CULTURE, BLOOD (ROUTINE X 2)  CULTURE, EXPECTORATED SPUTUM-ASSESSMENT  CULTURE, RESPIRATORY (NON-EXPECTORATED)  C DIFFICILE QUICK SCREEN W PCR REFLEX  NASAL CULTURE (N/P)  BLOOD CULTURE ID PANEL (REFLEXED)  BRAIN NATRIURETIC PEPTIDE  LACTIC ACID, PLASMA  LACTIC ACID, PLASMA  STREP PNEUMONIAE URINARY ANTIGEN  LACTIC ACID, PLASMA  LACTIC ACID, PLASMA  PROCALCITONIN  MAGNESIUM  LEGIONELLA PNEUMOPHILA SEROGP 1 UR AG  I-STAT CG4 LACTIC ACID, ED  I-STAT CG4 LACTIC ACID, ED    Imaging Review Dg Chest Portable 1 View  05/10/2016  CLINICAL DATA:  Four weeks of wheezing chest pain and shortness of breath. EXAM: PORTABLE CHEST 1 VIEW COMPARISON:  04/25/2016 FINDINGS: Lordotic positioning. The lungs are clear wiithout focal pneumonia, edema, pneumothorax or pleural effusion. Biapical pleural-parenchymal scarring again noted. Interstitial markings are diffusely coarsened with chronic features. The cardiopericardial silhouette is within normal limits for size. Telemetry leads overlie the chest. IMPRESSION: No active disease. Electronically Signed   By: Misty Stanley M.D.   On: 05/10/2016 16:07   I have personally reviewed and evaluated these images and lab results as part of my medical decision-making.   EKG Interpretation   Date/Time:  Saturday May 10 2016 15:32:42 EDT Ventricular Rate:  121 PR Interval:    QRS  Duration: 81 QT Interval:  322 QTC Calculation: 457 R Axis:   -28 Text Interpretation:  Sinus tachycardia Probable left atrial enlargement  Borderline left axis deviation No acute changes since last tracing no  significant change Confirmed by Kathrynn Humble, MD, Thelma Comp [phone]) on 05/10/2016  3:39:18 PM      MDM   Final diagnoses:  Severe sepsis ([place])  NSTEMI (non-ST elevated myocardial infarction) [facility])    The pt is a 65 yo male with a  Hx of COPD, CHF, CAD and previous PNAs presenting in respiratory distress increasing over the last week.  Reports has been attempting to use home inhalers without alleviation.    On exam pt is in acute distress with non-rebreather in place and refusing bipap.  Reports he will "use the mask when I pass out."  Given steroids and breathing tx with some alleviation.  CXR with no active disease per radiology read.  CBC with leukocytosis to 14.3 and found to be febrile and repeat temp.  LA WNL.  ABG displayed hypercarbia with acidosis but refusing bipap.  Started on empiric abx for clinical HCAP.  EKG displayed tachycardia with NSR and no acute ST or T wave changes.  Trop moderately elevated and given ASA.  Cardiology order placed in the ED but I did not discuss care with cardiology over  the phone.    Labs and CXR were viewed by myself and incorporated into medical decision making.  Discussed pertinent finding with patient or caregiver prior to admission with no further questions.  Pt care supervised by my attending Dr. Kathrynn Humble.   Geronimo Boot, MD PGY-3 Emergency Medicine     Geronimo Boot, MD 05/12/16 [id number]  Varney Biles, MD 05/18/16 [phone]

## 2016-05-10 NOTE — ED Notes (Signed)
Critical Troponin-0.59 MD Reister aware.

## 2016-05-10 NOTE — ED Notes (Signed)
Attempted to call report unsuccessful 

## 2016-05-10 NOTE — ED Notes (Signed)
Pt. Arrived with sob and congested cough.  His sats were in the low 80s at home and pt. Wear oxygen 2L [place].  Pt. Refused CPAP during transport.  Paramedics reports that pt. Is non-compliant with his medications.  Skin is warm, pink and dry.  Pt is not eating or drinking .   Pt. Has audible crackles and ronchi.  Resp 32. Pt. Has a non-rebreather upon arrival to the Room, Sats are100%

## 2016-05-10 NOTE — H&P (Signed)
History and Physical  Kenneth Merritt [id number] DOB: 29-Sep-1951 DOA: 05/10/2016  Referring physician: EDP PCP: Neale Burly, MD   Chief Complaint: sob, productive cough, hypoxia, fever, diarrhea  HPI: Kenneth Merritt is a 65 y.o. male   H/o long time smoker, reported quit several years ago, h/o aortobifemoral bypass surgery, h/o prostate ca s/p XRT in 2013, h/o PAF on eliquis, diastolic chf on lasix, copd on home oxygen 2liter who was recently discharged from the hospital after being treated for afib and COPd exacerbation is brought back to the ED by EMS due to increasing sob, productive cough, EMS report his oxygen saturation was in the 80's on 2liter, he refused bipap.   ED course: he is found to have fever of 100.9, sinus tachycardia and tachypneic, per nursing note his respiration rate initially in the 30's, he was put on NRB, o2sat at 100%, cxr no acute findings, ekg sinus tachycardia, no acute st/t changes,labs significant for leukocytosis wbc 14.3, and troponin elevation at 0.59, lactic acid wnl. ABG Ph 7.3 pco2 58.9, p02 350 on NRB, he adamantly refuse bipap, He was given vanc/zosyn/solumedrol and duoneb x1,cardiology paged by EDP and  hospitalist called to admit the patient.  When i entered the room, patient is on NRB, 02 sats100%, rr 18, does has audible rhonchi vs upper airway sounds, he reports having diarrhea in the last few days and has not been eating well. He also reports bilateral chest pain. Denies abdominal pain, he has h/o lower extremity edema intermittently, but none today.   Review of Systems:  Detail per HPI, Review of systems are otherwise negative  Past Medical History  Diagnosis Date  . GERD (gastroesophageal reflux disease)   . COPD (chronic obstructive pulmonary disease) ([place])     severe by PFTs August 2010  . 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 Atrial fibrillation  ([facility])     post-op a-fib. 3/11. post Aortobifem , /  Atrial fibrillation from nebulizer treatment  May, 2012, while hospitalized  . Renal artery stenosis ([place])      presumably corrected at time of aortobifem surgery March, 2011  . Tobacco user      stop smoking  . Pneumothorax      2011  before aortobifemoral surgery,  resolved  . Peripheral vascular disease, unspecified ([place])     bilateral intermittent claudication  . CAD (coronary artery disease), native coronary artery     catheterization 06/2009.Marland KitchenMarland Kitchen70% proximal LAD and 30% proximal RCA. normal LV function, no intervention planned prior to surgery for aorta. LV normal function  by catheter 8/10.   [name] Dyslipidemia   . DJD (degenerative joint disease)   . Ejection fraction     60%, echo, May, 2012, rapid atrial fib at the time  . Cancer [facility]) 2013    Prostate Radiation  . Myocardial infarction ([place]) 2011  . On home O2     2L N/C  . Chronic thoracic back pain   . Chronic pain syndrome    Past Surgical History  Procedure Laterality Date  . Abdominal aortic aneurysm repair  01/11/2010  . Pr vein bypass graft,aorto-fem-pop    . Inguinal hernia repair      RIGHT   Social History:  reports that he quit smoking about 6 years ago. His smoking use included Cigarettes. He has a 80 pack-year smoking history. He has  never used smokeless tobacco. He reports that he does not drink alcohol or use illicit drugs. Patient lives at home & is able to participate in activities of daily living independently   Allergies  Allergen Reactions  . Levaquin [Levofloxacin In D5w] Other (See Comments)    Pt states it caused dry mouth and rectal bleeding.     Family History  Problem Relation Age of Onset  . Cancer Father     prostate  . Heart attack Father       Prior to Admission medications   Medication Sig Start Date End Date Taking? Authorizing Provider  albuterol (PROVENTIL HFA;VENTOLIN HFA) 108 (90 BASE) MCG/ACT inhaler Inhale 2 puffs into the  lungs every 4 (four) hours as needed for wheezing or shortness of breath. 09/19/15  Yes Donne [name], MD  albuterol (PROVENTIL) (2.5 MG/3ML) 0.083% nebulizer solution Take 3 mLs (2.5 mg total) by nebulization every 4 (four) hours as needed for wheezing or shortness of breath. 03/15/15  Yes [name], MD  ALPRAZolam [name]) 0.25 MG tablet Take 0.25 mg by mouth 3 (three) times daily as needed for anxiety or sleep.  12/17/15  Yes Historical Provider, MD  atorvastatin (LIPITOR) 20 MG tablet Take 1 tablet (20 mg total) by mouth daily at 6 PM. 05/01/16  Yes Hosie Poisson, MD  diltiazem (CARDIZEM CD) 240 MG 24 hr capsule Take 1 capsule (240 mg total) by mouth daily. 05/01/16  Yes Hosie Poisson, MD  ELIQUIS 5 MG TABS tablet TAKE 1 TABLET BY MOUTH TWICE A DAY 02/11/16  Yes Satira Sark, MD  furosemide (LASIX) 20 MG tablet Take 1 tablet (20 mg total) by mouth daily. For 3days then as needed for swelling/weight gain of >2lbs overnight or 4-5lbs in 1 week 04/15/16  Yes Domenic Polite, MD  gabapentin (NEURONTIN) 300 MG capsule Take 300 mg by mouth 3 (three) times daily.   Yes Historical Provider, MD  HYDROcodone-acetaminophen (NORCO/VICODIN) 5-325 MG tablet Take 1-2 tablets by mouth every 6 (six) hours as needed for moderate pain or severe pain. 04/15/16  Yes Domenic Polite, MD  ipratropium (ATROVENT) 0.02 % nebulizer solution Take 0.5 mg by nebulization every 4 (four) hours as needed for wheezing or shortness of breath.   Yes Historical Provider, MD  metFORMIN (GLUCOPHAGE) 1000 MG tablet Take 1,000 mg by mouth daily with breakfast.   Yes Historical Provider, MD  omeprazole (PRILOSEC) 20 MG capsule Take 20 mg by mouth every morning.  04/21/11  Yes Historical Provider, MD  polyethylene glycol (MIRALAX / GLYCOLAX) packet Take 17 g by mouth daily. 05/01/16  Yes Hosie Poisson, MD  feeding supplement, ENSURE ENLIVE, (ENSURE ENLIVE) LIQD Take 237 mLs by mouth 2 (two) times daily between meals. Patient not taking: Reported on  05/10/2016 05/01/16   Hosie Poisson, MD  food thickener (THICK IT) POWD As needed Patient not taking: Reported on 05/10/2016 05/01/16   Hosie Poisson, MD  Multiple Vitamin (MULTIVITAMIN WITH MINERALS) TABS tablet Take 1 tablet by mouth daily. Patient not taking: Reported on 05/10/2016 05/01/16   Hosie Poisson, MD  nitroGLYCERIN (NITROSTAT) 0.4 MG SL tablet Place 1 tablet (0.4 mg total) under the tongue every 5 (five) minutes x 3 doses as needed for chest pain. 06/11/15   [name], MD    Physical Exam: BP 135/77 mmHg  Pulse 106  Temp(Src) 100.9 F (38.3 C) (Axillary)  Resp 24  Wt 58.06 kg (128 lb)  SpO2 100%  General:  Frail, Chronically ill appearance Eyes: PERRL  ENT: unremarkable Neck: supple, no JVD Cardiovascular: sinus tachycardia Respiratory: bilateral wheezing, + rhonchi vs upper airway sounds, moderate aeration  Abdomen: soft/ND/ND, positive bowel sounds, old healed mid line surgical scar Skin: no rash Musculoskeletal:  No edema Psychiatric: calm/cooperative Neurologic: no focal findings            Labs on Admission:  Basic Metabolic Panel:  Recent Labs Lab 05/10/16 1540  NA 135  K 3.4*  CL 96*  CO2 27  GLUCOSE 94  BUN <5*  CREATININE 0.68  CALCIUM 7.9*   Liver Function Tests:  Recent Labs Lab 05/10/16 1540  AST 28  ALT 46  ALKPHOS 83  BILITOT 1.9*  PROT 5.7*  ALBUMIN 2.4*   No results for input(s): LIPASE, AMYLASE in the last 168 hours. No results for input(s): AMMONIA in the last 168 hours. CBC:  Recent Labs Lab 05/10/16 1540  WBC 14.3*  NEUTROABS 11.9*  HGB 11.0*  HCT 34.9*  MCV 91.4  PLT 226   Cardiac Enzymes:  Recent Labs Lab 05/10/16 1540  TROPONINI 0.59*    BNP (last 3 results)  Recent Labs  04/06/16 1906 04/23/16 1459 05/10/16 1540  BNP 140.0* 117.8* 68.7    ProBNP (last 3 results) No results for input(s): PROBNP in the last 8760 hours.  CBG: No results for input(s): GLUCAP in the last 168 hours.  Radiological  Exams on Admission: Dg Chest Portable 1 View  05/10/2016  CLINICAL DATA:  Four weeks of wheezing chest pain and shortness of breath. EXAM: PORTABLE CHEST 1 VIEW COMPARISON:  04/25/2016 FINDINGS: Lordotic positioning. The lungs are clear wiithout focal pneumonia, edema, pneumothorax or pleural effusion. Biapical pleural-parenchymal scarring again noted. Interstitial markings are diffusely coarsened with chronic features. The cardiopericardial silhouette is within normal limits for size. Telemetry leads overlie the chest. IMPRESSION: No active disease. Electronically Signed   By: Misty Stanley M.D.   On: 05/10/2016 16:07    EKG: Independently reviewed. Sinus tachycardia  Assessment/Plan Present on Admission:  **None**  Acute on chronic respiratory failure with hypoxia and hypercapnia: likely from copd exacerbation, treat underline condition. Currently on NRB, will try to titrate oxygen supplement to keep sats between 88% and 95%.  Copd exacerbation:  Copd order set utilized. Dose has wheezing on exam, cxr no infiltrates, he received vanc/zosyn in the ED, will hold off on abx for now due to concerning for c diff. Continue nebs with xopenex/atrovent/solumedrol/mucinex. Currently npo except meds due to on NRB.  Sepsis; with fever 100.9, leukocytosis, sinus tachycardia, hypoxia, tachypnea on presentation. Sepsis order set utilized. ua no infection, blood culture obtained from the ED, sputum culture pending. Though cxr no infiltrate. Suspect c diff might be the source of sepsis. Will empirically treat c diff , d/c meds if c diff negative or no diarrhea in the hospital.  Troponin elevation: in the setting of possible sepsis. ekg no acute changes, but troponin is more elevated compare to last hospitalization, continue statin, he is not on betablocker at home, cardiology consulted by EDP  PAF; currently sinus tachycardia Continue on ccb and apixaban which are continued  NIDDM2, hold metformin, most  recent a1c 6.6 in 04/2016, blood sugar likely will be elevated with steroids use, on ssi here.  H/o dysphagia: recent swallow eval on 6/22 "Diet recommendations: Dysphagia 2 (fine chop);Thin liquid Liquids provided via: Cup;No straw Medication Administration: Crushed with puree Supervision: Patient able to self feed;Full supervision/cueing for compensatory strategies Compensations: Slow rate;Small sips/bites;Clear throat intermittently Postural Changes and/or Swallow Maneuvers:  Seated upright 90 degrees"  For now patient is npo except meds due to respiratory distress on NRB.   DVT prophylaxis: eliquis  Consultants: cardiology called by EDP  Code Status: full   Family Communication:  Patient   Disposition Plan: admit to stepdown  Time spent: 27mins  Reganne Messerschmidt MD, PhD [facility] Pager [phone] If 7PM-7AM, please contact night-coverage at [email or web address], password [facility]

## 2016-05-11 DIAGNOSIS — R0602 Shortness of breath: Secondary | ICD-10-CM

## 2016-05-11 DIAGNOSIS — E876 Hypokalemia: Secondary | ICD-10-CM

## 2016-05-11 DIAGNOSIS — I5032 Chronic diastolic (congestive) heart failure: Secondary | ICD-10-CM

## 2016-05-11 LAB — EXPECTORATED SPUTUM ASSESSMENT W GRAM STAIN, RFLX TO RESP C

## 2016-05-11 LAB — BLOOD CULTURE ID PANEL (REFLEXED)
ACINETOBACTER BAUMANNII: NOT DETECTED
CANDIDA KRUSEI: NOT DETECTED
CARBAPENEM RESISTANCE: NOT DETECTED
Candida albicans: NOT DETECTED
Candida glabrata: NOT DETECTED
Candida parapsilosis: NOT DETECTED
Candida tropicalis: NOT DETECTED
ENTEROBACTERIACEAE SPECIES: NOT DETECTED
Enterobacter cloacae complex: NOT DETECTED
Enterococcus species: NOT DETECTED
Escherichia coli: NOT DETECTED
HAEMOPHILUS INFLUENZAE: NOT DETECTED
KLEBSIELLA OXYTOCA: NOT DETECTED
Klebsiella pneumoniae: NOT DETECTED
LISTERIA MONOCYTOGENES: NOT DETECTED
METHICILLIN RESISTANCE: DETECTED — AB
NEISSERIA MENINGITIDIS: NOT DETECTED
PSEUDOMONAS AERUGINOSA: NOT DETECTED
Proteus species: NOT DETECTED
STAPHYLOCOCCUS AUREUS BCID: NOT DETECTED
STAPHYLOCOCCUS SPECIES: DETECTED — AB
STREPTOCOCCUS AGALACTIAE: NOT DETECTED
STREPTOCOCCUS PYOGENES: NOT DETECTED
STREPTOCOCCUS SPECIES: NOT DETECTED
Serratia marcescens: NOT DETECTED
Streptococcus pneumoniae: NOT DETECTED
VANCOMYCIN RESISTANCE: NOT DETECTED

## 2016-05-11 LAB — COMPREHENSIVE METABOLIC PANEL
ALT: 41 U/L (ref 17–63)
ANION GAP: 10 (ref 5–15)
AST: 22 U/L (ref 15–41)
Albumin: 1.9 g/dL — ABNORMAL LOW (ref 3.5–5.0)
Alkaline Phosphatase: 75 U/L (ref 38–126)
BUN: <5 mg/dL — ABNORMAL LOW (ref 6–20)
CHLORIDE: 95 mmol/L — AB (ref 101–111)
CO2: 29 mmol/L (ref 22–32)
CREATININE: 0.56 mg/dL — AB (ref 0.61–1.24)
Calcium: 7.6 mg/dL — ABNORMAL LOW (ref 8.9–10.3)
Glucose, Bld: 147 mg/dL — ABNORMAL HIGH (ref 65–99)
POTASSIUM: 3.8 mmol/L (ref 3.5–5.1)
SODIUM: 134 mmol/L — AB (ref 135–145)
Total Bilirubin: 1.4 mg/dL — ABNORMAL HIGH (ref 0.3–1.2)
Total Protein: 5 g/dL — ABNORMAL LOW (ref 6.5–8.1)

## 2016-05-11 LAB — GLUCOSE, CAPILLARY
GLUCOSE-CAPILLARY: 153 mg/dL — AB (ref 65–99)
Glucose-Capillary: 146 mg/dL — ABNORMAL HIGH (ref 65–99)
Glucose-Capillary: 170 mg/dL — ABNORMAL HIGH (ref 65–99)
Glucose-Capillary: 178 mg/dL — ABNORMAL HIGH (ref 65–99)
Glucose-Capillary: 182 mg/dL — ABNORMAL HIGH (ref 65–99)
Glucose-Capillary: 182 mg/dL — ABNORMAL HIGH (ref 65–99)

## 2016-05-11 LAB — EXPECTORATED SPUTUM ASSESSMENT W REFEX TO RESP CULTURE

## 2016-05-11 LAB — TROPONIN I
TROPONIN I: 0.22 ng/mL — AB (ref <0.03)
Troponin I: 0.38 ng/mL (ref <0.03)
Troponin I: 0.75 ng/mL (ref <0.03)

## 2016-05-11 LAB — CBC
HCT: 30.3 % — ABNORMAL LOW (ref 39.0–52.0)
HEMOGLOBIN: 9.5 g/dL — AB (ref 13.0–17.0)
MCH: 28 pg (ref 26.0–34.0)
MCHC: 31.4 g/dL (ref 30.0–36.0)
MCV: 89.4 fL (ref 78.0–100.0)
PLATELETS: 212 10*3/uL (ref 150–400)
RBC: 3.39 MIL/uL — AB (ref 4.22–5.81)
RDW: 15.9 % — ABNORMAL HIGH (ref 11.5–15.5)
WBC: 12 10*3/uL — AB (ref 4.0–10.5)

## 2016-05-11 LAB — URINE CULTURE: Culture: <10000 — AB

## 2016-05-11 LAB — LACTIC ACID, PLASMA
Lactic Acid, Venous: 0.9 mmol/L (ref 0.5–1.9)
Lactic Acid, Venous: 1 mmol/L (ref 0.5–1.9)

## 2016-05-11 LAB — STREP PNEUMONIAE URINARY ANTIGEN: STREP PNEUMO URINARY ANTIGEN: NEGATIVE

## 2016-05-11 LAB — PROTIME-INR
INR: 2.15 — AB (ref 0.00–1.49)
PROTHROMBIN TIME: 23.8 s — AB (ref 11.6–15.2)

## 2016-05-11 LAB — MAGNESIUM: MAGNESIUM: 2.2 mg/dL (ref 1.7–2.4)

## 2016-05-11 MED ORDER — LEVALBUTEROL HCL 0.63 MG/3ML IN NEBU
INHALATION_SOLUTION | RESPIRATORY_TRACT | Status: AC
  Administered 2016-05-11: 0.63 mg
  Filled 2016-05-11: qty 3

## 2016-05-11 MED ORDER — VANCOMYCIN HCL IN DEXTROSE 750-5 MG/150ML-% IV SOLN
750.0000 mg | Freq: Two times a day (BID) | INTRAVENOUS | Status: DC
  Administered 2016-05-11 – 2016-05-13 (×4): 750 mg via INTRAVENOUS
  Filled 2016-05-11 (×7): qty 150

## 2016-05-11 MED ORDER — INSULIN ASPART 100 UNIT/ML ~~LOC~~ SOLN
0.0000 [IU] | Freq: Every day | SUBCUTANEOUS | Status: DC
  Administered 2016-05-12: 2 [IU] via SUBCUTANEOUS

## 2016-05-11 MED ORDER — LEVALBUTEROL HCL 1.25 MG/0.5ML IN NEBU
0.6300 mg | INHALATION_SOLUTION | Freq: Four times a day (QID) | RESPIRATORY_TRACT | Status: DC
  Administered 2016-05-11 – 2016-05-13 (×5): 0.63 mg via RESPIRATORY_TRACT
  Filled 2016-05-11 (×8): qty 0.5

## 2016-05-11 MED ORDER — POTASSIUM CHLORIDE CRYS ER 20 MEQ PO TBCR
20.0000 meq | EXTENDED_RELEASE_TABLET | Freq: Once | ORAL | Status: AC
  Administered 2016-05-11: 20 meq via ORAL

## 2016-05-11 MED ORDER — INSULIN ASPART 100 UNIT/ML ~~LOC~~ SOLN
0.0000 [IU] | Freq: Three times a day (TID) | SUBCUTANEOUS | Status: DC
  Administered 2016-05-11: 2 [IU] via SUBCUTANEOUS
  Administered 2016-05-11: 1 [IU] via SUBCUTANEOUS
  Administered 2016-05-12: 2 [IU] via SUBCUTANEOUS
  Administered 2016-05-12: 3 [IU] via SUBCUTANEOUS
  Administered 2016-05-12: 2 [IU] via SUBCUTANEOUS
  Administered 2016-05-13 (×2): 3 [IU] via SUBCUTANEOUS
  Administered 2016-05-13 – 2016-05-14 (×3): 2 [IU] via SUBCUTANEOUS

## 2016-05-11 MED ORDER — POTASSIUM CHLORIDE CRYS ER 10 MEQ PO TBCR
EXTENDED_RELEASE_TABLET | ORAL | Status: AC
  Filled 2016-05-11: qty 2

## 2016-05-11 MED ORDER — LEVALBUTEROL HCL 0.63 MG/3ML IN NEBU
0.6300 mg | INHALATION_SOLUTION | RESPIRATORY_TRACT | Status: DC | PRN
Start: 2016-05-11 — End: 2016-05-14
  Administered 2016-05-11 – 2016-05-14 (×8): 0.63 mg via RESPIRATORY_TRACT
  Filled 2016-05-11 (×7): qty 3

## 2016-05-11 NOTE — Progress Notes (Signed)
PROGRESS NOTE  Kenneth Merritt  [id number] DOB: [birth year]  DOA: 05/10/2016 PCP: Neale Burly, MD   Brief Narrative:  65 year old male, married, lives with spouse and son, uses home oxygen 3 L/m continuously, ambulates with the help of a walker, sees cardiology in [place], [place], PMH of GERD, COPD, Aorto bifemoral  bypass for total occlusion of the aorta, A. fib,  chronic diastolic CHF,former smoker, CAD, HLD, chronic pain, prostate cancer status post radiation, recent hospitalization 04/21/16-05/01/16 for A. fib with RVR, acute metabolic encephalopathy, elevated troponins, COPD exacerbation, questionable aspiration, presented to [facility] ED on 05/10/16 with complaints of worsening dyspnea, productive cough, hypoxia with oxygenation in the 80s on 2 L/m. In ED, low-grade fever, tachycardia, tachypnea, declined BiPAP and placed on NRB, mildly elevated troponin. Admitted to stepdown unit for acute on chronic hypoxic and hypercapnic respiratory failure and COPD exacerbation. Cardiology consulted.    Assessment & Plan:   Active Problems:   Sepsis ([place])   SOB (shortness of breath)  COPD exacerbation - May be infectious exacerbation - Treated with oxygen, bronchodilators, IV Solu-Medrol. Received a dose of Zosyn and vancomycin in ED which were not continued in the hospital due to concern for diarrhea from C. Difficile. - Clinically improving. Add flutter valve. - Patient has opted for partial code: He confirms DO NOT INTUBATE and does not want BiPAP either.  Acute on chronic respiratory failure with hypoxia and hypercapnia - Likely from COPD exacerbation. Not overtly volume overloaded. Management as above. Clinically improving. Titrate for oxygen saturation between 88-92 percent.  Chronic diastolic CHF - Clinically compensated.  SIRS - Present on admission with temperature 100.9, leukocytosis, tachycardia, hypoxia and tachypnea. - UA not suggestive of UTI. Blood cultures pending. Pro-calcitonin minimally  elevated. Lactate normal. Chest x-ray without active disease. - Antibiotics were not continued in the hospital due to concern for C. Difficile.  Diarrhea, R/O C. Difficile - As per patient, diarrhea was decreasing even prior to hospital admission. No BM since 05/09/16. - Unable to send stool for C. difficile because he has not had a BM. Remains on oral vancomycin-consider discontinuing if no further BMs or if C. difficile PCR negative.  Elevated troponin/CAD - Secondary to acute illness and possible demand ischemia. No chest pain reported. EKG without acute changes. Continue statins. Cardiology input pending-discussed with cardiology.  Paroxysmal atrial fibrillation - Remains in sinus rhythm. Continue calcium channel blockers and Apixaban  Type II DM - A1c 6.6 on 04/2016. SSI.  Dysphagia - Resume dysphagia 2 diet and thin liquids as per recent swallow evaluation. He had been made nothing by mouth due to respiratory distress.  Anemia - No bleeding reported. Baseline hemoglobin said to be in 10-11. Follow CBCs. Outpatient follow-up with [facility].  Severe protein calorie malnutrition  Hypokalemia - Replaced.    DVT prophylaxis: Eliquis Code Status: Partial Family Communication:  Discussed with patient. No family at bedside. Disposition Plan:  DC home when medically stable. Admitted to step down unit-continue treatment here for another 24 hours.   Consultants:   Cardiology-pending  Procedures:   None  Antimicrobials:   IV vancomycin and Zosyn-discontinued  Oral vancomycin    Subjective: Feels better. Dyspnea improved. No chest pain reported. No cough. No BM since 6/30. As per RN, no acute issues.  Objective:  Filed Vitals:   05/11/16 [id number] 05/11/16 0813 05/11/16 0823 05/11/16 0922  BP:   97/78   Pulse:      Temp:  97.3 F (36.3 C)    TempSrc:  Oral    Resp:  16 16   Height:      Weight:      SpO2: 100% 100% 100% 100%    Intake/Output Summary (Last 24  hours) at 05/11/16 0930 Last data filed at 05/11/16 0600  Gross per 24 hour  Intake   1160 ml  Output    700 ml  Net    460 ml   Filed Weights   05/10/16 1951 05/10/16 2001 05/11/16 0300  Weight: 56.926 kg (125 lb 8 oz) 56.926 kg (125 lb 8 oz) 57.063 kg (125 lb 12.8 oz)    Examination:  General exam: Pleasant middle-aged male, frail, chronically looking, lying comfortably propped up in bed. Respiratory system: globally diminished breath sounds with scattered occasional expiratory rhonchi and no crackles. Respiratory effort normal. Able to speak in full sentences. Cardiovascular system: S1 & S2 heard, RRR. No JVD, murmurs, rubs, gallops or clicks. No pedal edema. Telemetry: Sinus rhythm. Gastrointestinal system: Abdomen is nondistended, soft and nontender. No organomegaly or masses felt. Normal bowel sounds heard. Central nervous system: Alert and oriented. No focal neurological deficits. Extremities: Symmetric 5 x 5 power. Skin: No rashes, lesions or ulcers Psychiatry: Judgement and insight appear normal. Mood & affect appropriate.     Data Reviewed: I have personally reviewed following labs and imaging studies  CBC:  Recent Labs Lab 05/10/16 1540 05/11/16 0601  WBC 14.3* 12.0*  NEUTROABS 11.9*  --   HGB 11.0* 9.5*  HCT 34.9* 30.3*  MCV 91.4 89.4  PLT 226 [id number]   Basic Metabolic Panel:  Recent Labs Lab 05/10/16 1540 05/11/16 0601  NA 135 134*  K 3.4* 3.8  CL 96* 95*  CO2 27 29  GLUCOSE 94 147*  BUN <5* <5*  CREATININE 0.68 0.56*  CALCIUM 7.9* 7.6*  MG  --  2.2   GFR: Estimated Creatinine Clearance: 75.3 mL/min (by C-G formula based on Cr of 0.56). Liver Function Tests:  Recent Labs Lab 05/10/16 1540 05/11/16 0601  AST 28 22  ALT 46 41  ALKPHOS 83 75  BILITOT 1.9* 1.4*  PROT 5.7* 5.0*  ALBUMIN 2.4* 1.9*   No results for input(s): LIPASE, AMYLASE in the last 168 hours. No results for input(s): AMMONIA in the last 168 hours. Coagulation  Profile:  Recent Labs Lab 05/11/16 0601  INR 2.15*  Elevated INR likely related to Eliquis and of no consequence.   Cardiac Enzymes:  Recent Labs Lab 05/10/16 1540 05/10/16 1730 05/10/16 2312 05/11/16 0601  TROPONINI 0.59* 0.49* 0.38* 0.22*   BNP (last 3 results) No results for input(s): PROBNP in the last 8760 hours. HbA1C: No results for input(s): HGBA1C in the last 72 hours. CBG:  Recent Labs Lab 05/10/16 2042 05/11/16 0021 05/11/16 0308 05/11/16 0811  GLUCAP 172* 178* 182* 153*   Lipid Profile: No results for input(s): CHOL, HDL, LDLCALC, TRIG, CHOLHDL, LDLDIRECT in the last 72 hours. Thyroid Function Tests: No results for input(s): TSH, T4TOTAL, FREET4, T3FREE, THYROIDAB in the last 72 hours. Anemia Panel: No results for input(s): VITAMINB12, FOLATE, FERRITIN, TIBC, IRON, RETICCTPCT in the last 72 hours.  Sepsis Labs:  Recent Labs Lab 05/10/16 1556 05/10/16 1730 05/10/16 2043 05/10/16 2055 05/10/16 2312  PROCALCITON  --   --  0.37  --   --   LATICACIDVEN 1.51 1.2  --  1.0 1.0  0.9    Recent Results (from the past 240 hour(s))  Blood Culture (routine x 2)     Status: None (Preliminary result)  Collection Time: 05/10/16  8:55 PM  Result Value Ref Range Status   Specimen Description BLOOD LEFT HAND  Final   Special Requests IN PEDIATRIC BOTTLE [place]  Final   Culture PENDING  Incomplete   Report Status PENDING  Incomplete         Radiology Studies: Dg Chest Portable 1 View  05/10/2016  CLINICAL DATA:  Four weeks of wheezing chest pain and shortness of breath. EXAM: PORTABLE CHEST 1 VIEW COMPARISON:  04/25/2016 FINDINGS: Lordotic positioning. The lungs are clear wiithout focal pneumonia, edema, pneumothorax or pleural effusion. Biapical pleural-parenchymal scarring again noted. Interstitial markings are diffusely coarsened with chronic features. The cardiopericardial silhouette is within normal limits for size. Telemetry leads overlie the chest.  IMPRESSION: No active disease. Electronically Signed   By: Misty Stanley M.D.   On: 05/10/2016 16:07        Scheduled Meds: . apixaban  5 mg Oral BID  . diltiazem  240 mg Oral Daily  . famotidine  20 mg Oral BID  . gabapentin  300 mg Oral TID  . guaiFENesin  600 mg Oral BID  . insulin aspart  0-9 Units Subcutaneous Q4H  . ipratropium  0.5 mg Nebulization Q6H  . levalbuterol  1.25 mg Nebulization Q6H  . methylPREDNISolone (SOLU-MEDROL) injection  60 mg Intravenous Q8H  . sodium chloride flush  3 mL Intravenous Q12H  . vancomycin  250 mg Oral Q6H   Continuous Infusions:    LOS: 1 day    Time spent: 45 minutes.    [facility], MD [facility] Pager [phone]  If 7PM-7AM, please contact night-coverage [email or web address] Password [email or web address] 05/11/2016, 9:30 AM

## 2016-05-11 NOTE — Progress Notes (Signed)
CRITICAL VALUE ALERT  Critical value received:  Trop 0.75   Date of notification:  05/11/16  Time of notification:  [id number]  Critical value read back:Yes.    Nurse who received alert:  Virgie Dad, RN  MD notified (1st page):  Dr Algis Liming  Time of first page:  1524

## 2016-05-11 NOTE — Evaluation (Signed)
Physical Therapy Evaluation Patient Details Name: Kenneth Merritt MRN: [id number] DOB: [birth year] Today's Date: 05/11/2016   History of Present Illness  Kenneth Merritt is a 65 y.o. male with a h/o COPD on home O2, CAD, diastolic dysfunction, and afib now admitted with COPD exacerbation.He had recent hospitalization 04/21/16-05/01/16 for A. fib with RVR, acute metabolic encephalopathy, elevated troponins, COPD exacerbation, and questionable aspiration.  Clinical Impression  Patient presents with decreased independence with mobility due to deficits listed in PT problem list.  Mainly limited due to general weakness and cardiopulmonary status.  Feel continued skilled PT in the acute setting will allow pt to d/c home with family support and to continue HHPT.      Follow Up Recommendations Home health PT (resume HHPT)    Equipment Recommendations  None recommended by PT    Recommendations for Other Services       Precautions / Restrictions Precautions Precautions: Fall Precaution Comments: O2 dep, enteric precautions      Mobility  Bed Mobility Overal bed mobility: Needs Assistance       Supine to sit: Min guard     General bed mobility comments: for cords/safety  Transfers Overall transfer level: Needs assistance Equipment used: Rolling walker (2 wheeled) Transfers: Sit to/from Stand Sit to Stand: Min guard         General transfer comment: for balance assist with lines  Ambulation/Gait Ambulation/Gait assistance: Min assist;+2 safety/equipment Ambulation Distance (Feet): 160 Feet (with one seated rest break)   Gait Pattern/deviations: Step-through pattern;Decreased stride length;Trunk flexed     General Gait Details: one seated rest and one standing rest with VSS (though once SpO2 reading lower than 90 with pt advised to sit for one seated rest; audible wheezes throughout and RT made aware pt needing treatment  Stairs            Wheelchair Mobility    Modified  Rankin (Stroke Patients Only)       Balance Overall balance assessment: Needs assistance   Sitting balance-Leahy Scale: Fair       Standing balance-Leahy Scale: Fair Standing balance comment: can stand briefly unsupported, walker for balance with ambulation                             Pertinent Vitals/Pain Faces Pain Scale: Hurts a little bit Pain Location: back after laying on cords Pain Descriptors / Indicators: Sore Pain Intervention(s): Monitored during session;Repositioned    Home Living Family/patient expects to be discharged to:: Private residence Living Arrangements: Spouse/significant other Available Help at Discharge: Family Type of Home: House Home Access: Stairs to enter Entrance Stairs-Rails: Right Entrance Stairs-Number of Steps: 3 Home Layout: Able to live on main level with bedroom/bathroom;Two level Home Equipment: Walker - 2 wheels;Cane - single point;Toilet riser      Prior Function Level of Independence: Independent with assistive device(s)         Comments: reports since d/c home hasn't been able to walk much due to legs weak     Hand Dominance   Dominant Hand: Right    Extremity/Trunk Assessment   Upper Extremity Assessment: Generalized weakness           Lower Extremity Assessment: Generalized weakness      Cervical / Trunk Assessment: Kyphotic  Communication   Communication: No difficulties  Cognition Arousal/Alertness: Awake/alert Behavior During Therapy: WFL for tasks assessed/performed Overall Cognitive Status: Within Functional Limits for tasks assessed  General Comments General comments (skin integrity, edema, etc.): on 6L with ambulation (5L in room)    Exercises        Assessment/Plan    PT Assessment Patient needs continued PT services  PT Diagnosis Generalized weakness   PT Problem List Decreased strength;Decreased activity tolerance;Decreased  balance;Cardiopulmonary status limiting activity;Decreased knowledge of use of DME  PT Treatment Interventions     PT Goals (Current goals can be found in the Care Plan section) Acute Rehab PT Goals Patient Stated Goal: To get stronger PT Goal Formulation: With patient Time For Goal Achievement: 05/18/16 Potential to Achieve Goals: Good    Frequency Min 3X/week   Barriers to discharge        Co-evaluation               End of Session Equipment Utilized During Treatment: Oxygen;Gait belt Activity Tolerance: Patient tolerated treatment well Patient left: in chair;with call bell/phone within reach Nurse Communication: Other (comment) (need for breathing tx (to RT))         Time: 1137-1206 PT Time Calculation (min) (ACUTE ONLY): 29 min   Charges:   PT Evaluation $PT Eval Moderate Complexity: 1 Procedure PT Treatments $Gait Training: 8-22 mins   PT G CodesReginia Naas 2016-06-01, 2:19 PM  Magda Kiel, [place] 01-Jun-2016

## 2016-05-11 NOTE — Progress Notes (Signed)
Pharmacy Antibiotic Note  Kenneth Merritt is a 65 y.o. male admitted on 05/10/2016 with bacteremia.  Pharmacy has been consulted for vancomycin dosing.  Plan: Vancomycin 750 mg IV every 12 hours.  Goal trough 15-20 mcg/mL.  Height: 5\' 4"  (162.6 cm) Weight: 125 lb 12.8 oz (57.063 kg) IBW/kg (Calculated) : 59.2  Temp (24hrs), Avg:97.3 F (36.3 C), Min:96.4 F (35.8 C), Max:97.6 F (36.4 C)   Recent Labs Lab 05/10/16 1540 05/10/16 1556 05/10/16 1730 05/10/16 2055 05/10/16 2312 05/11/16 0601  WBC 14.3*  --   --   --   --  12.0*  CREATININE 0.68  --   --   --   --  0.56*  LATICACIDVEN  --  1.51 1.2 1.0 1.0  0.9  --     Estimated Creatinine Clearance: 75.3 mL/min (by C-G formula based on Cr of 0.56).    Allergies  Allergen Reactions  . Levaquin [Levofloxacin In D5w] Other (See Comments)    Pt states it caused dry mouth and rectal bleeding.     Antimicrobials this admission: Vancomycin 7/2>>  Dose adjustments this admission: N/A  Microbiology results: 7/1 Blood 1/2 CONS  7/2 Sputum: Pending (likely flora)  Levester Fresh, PharmD, BCPS, [facility] Clinical Pharmacist Pager [phone] 05/11/2016 6:22 PM

## 2016-05-11 NOTE — Consult Note (Signed)
CARDIOLOGY CONSULT NOTE     Primary Care Physician: Neale Burly, MD Referring Physician:  hospitalist  Admit Date: 05/10/2016  Reason for consultation:  + troponin  Kenneth Merritt is a 65 y.o. male with a h/o COPD on home O2, CAD, diastolic dysfunction, and afib now admitted with COPD exacerbation.   The patient had recent hospitalization 04/21/16-05/01/16 for A. fib with RVR, acute metabolic encephalopathy, elevated troponins, COPD exacerbation, and questionable aspiration.  The patient reports progressive SOB and cough since discharge.  O2 requirement has increased and the patient has been noted to have a low grade temp.  The patient is admitted for hypercarbic respiratory failure/ COPD exacerbation.  The patient denies CP.  He reports that his afib "comes and goes".   Reports compliance with anticoagulation without bleeding.  In ED, noted to have a mildly elevated troponin for which cardiology is consulted.   Past Medical History  Diagnosis Date  . GERD (gastroesophageal reflux disease)   . COPD (chronic obstructive pulmonary disease) ([place])     severe by PFTs August 2010  . 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 Atrial fibrillation ([facility])     post-op a-fib. 3/11. post Aortobifem , /  Atrial fibrillation from nebulizer treatment  May, 2012, while hospitalized  . Renal artery stenosis ([place])      presumably corrected at time of aortobifem surgery March, 2011  . Tobacco user      stop smoking  . Pneumothorax      2011  before aortobifemoral surgery,  resolved  . Peripheral vascular disease, unspecified ([place])     bilateral intermittent claudication  . CAD (coronary artery disease), native coronary artery     catheterization 06/2009.Marland KitchenMarland Kitchen70% proximal LAD and 30% proximal RCA. normal LV function, no intervention planned prior to surgery for aorta. LV normal function  by catheter 8/10.   [name] Dyslipidemia     . DJD (degenerative joint disease)   . Ejection fraction     60%, echo, May, 2012, rapid atrial fib at the time  . Cancer [facility]) 2013    Prostate Radiation  . Myocardial infarction ([place]) 2011  . On home O2     2L N/[place]  . Chronic thoracic back pain   . Chronic pain syndrome    Past Surgical History  Procedure Laterality Date  . Abdominal aortic aneurysm repair  01/11/2010  . Pr vein bypass graft,aorto-fem-pop    . Inguinal hernia repair      RIGHT    . apixaban  5 mg Oral BID  . diltiazem  240 mg Oral Daily  . famotidine  20 mg Oral BID  . gabapentin  300 mg Oral TID  . guaiFENesin  600 mg Oral BID  . insulin aspart  0-5 Units Subcutaneous QHS  . insulin aspart  0-9 Units Subcutaneous TID WC  . ipratropium  0.5 mg Nebulization Q6H  . levalbuterol  0.63 mg Nebulization Q6H  . methylPREDNISolone (SOLU-MEDROL) injection  60 mg Intravenous Q8H  . sodium chloride flush  3 mL Intravenous Q12H  . vancomycin  250 mg Oral Q6H      Allergies  Allergen Reactions  . Levaquin [Levofloxacin In D5w] Other (See Comments)    Pt states it caused dry mouth and rectal bleeding.     Social History   Social History  . Marital Status: Married  Spouse Name: N/A  . Number of Children: N/A  . Years of Education: N/A   Occupational History  . Not on file.   Social History Main Topics  . Smoking status: Former Smoker -- 2.00 packs/day for 40 years    Types: Cigarettes    Quit date: 01/11/2010  . Smokeless tobacco: Never Used  . Alcohol Use: No  . Drug Use: No  . Sexual Activity: Not on file   Other Topics Concern  . Not on file   Social History Narrative   Married, retired.     Family History  Problem Relation Age of Onset  . Cancer Father     prostate  . Heart attack Father     ROS- All systems are reviewed and negative except as per the HPI above  Physical Exam: Telemetry: Filed Vitals:   05/11/16 [id number] 05/11/16 0813 05/11/16 0823 05/11/16 0922  BP:   97/78    Pulse:      Temp:  97.3 F (36.3 C)    TempSrc:  Oral    Resp:  16 16   Height:      Weight:      SpO2: 100% 100% 100% 100%    GEN- The patient is chronically ill appearing, alert and oriented x 3 today.   Head- normocephalic, atraumatic Eyes-  Sclera clear, conjunctiva pink Ears- hearing intact Oropharynx- clear Neck- supple,   Lungs- prolonged expiratory phase, decreased BS, normal work of breathing Heart- Regular rate and rhythm  GI- soft, NT, ND, + BS Extremities- no clubbing, cyanosis, + dependant edema MS- no significant deformity or atrophy Skin- no rash or lesion Psych- euthymic mood, full affect Neuro- strength and sensation are intact  EKG reveals sinus tachycardia, no ischemic changes  Labs:   Lab Results  Component Value Date   WBC 12.0* 05/11/2016   HGB 9.5* 05/11/2016   HCT 30.3* 05/11/2016   MCV 89.4 05/11/2016   PLT 212 05/11/2016    Recent Labs Lab 05/11/16 0601  NA 134*  K 3.8  CL 95*  CO2 29  BUN <5*  CREATININE 0.56*  CALCIUM 7.6*  PROT 5.0*  BILITOT 1.4*  ALKPHOS 75  ALT 41  AST 22  GLUCOSE 147*   Lab Results  Component Value Date   TROPONINI 0.22* 05/11/2016    Lab Results  Component Value Date   CHOL 157 11/22/2013   CHOL * 07/04/2009    213        ATP III CLASSIFICATION:  <200     mg/dL   Desirable  200-239  mg/dL   Borderline High  >=240    mg/dL   High          Lab Results  Component Value Date   HDL 54 11/22/2013   HDL 30* 07/04/2009   Lab Results  Component Value Date   LDLCALC 81 11/22/2013   LDLCALC * 07/04/2009    166        Total Cholesterol/HDL:CHD Risk Coronary Heart Disease Risk Table                     Men   Women  1/2 Average Risk   3.4   3.3  Average Risk       5.0   4.4  2 X Average Risk   9.6   7.1  3 X Average Risk  23.4   11.0        Use the calculated Patient Ratio above and  the CHD Risk Table to determine the patient's CHD Risk.        ATP III CLASSIFICATION (LDL):  <100      mg/dL   Optimal  100-129  mg/dL   Near or Above                    Optimal  130-159  mg/dL   Borderline  160-189  mg/dL   High  >190     mg/dL   Very High   Lab Results  Component Value Date   TRIG 110 11/22/2013   TRIG 86 07/04/2009   Lab Results  Component Value Date   CHOLHDL 2.9 11/22/2013   CHOLHDL 7.1 07/04/2009   No results found for: LDLDIRECT    Radiology: CXR reveals no active ASDz  Echo 5/17: EF 55-60%  ASSESSMENT AND PLAN:   1. Elevated troponin No chest pain or ischemic EKG changes.  Pt has known CAD. Likely due to demand ischemia secondary to lung disease/ acute respiratory failure. Poor candidate for beta blocker given lung disease Would avoid ASA given eliquis Would follow conservatively for now.  Defer any additional CV testing for marked increase in troponin or development of anginal symptoms.  2. afib Currently maintaining sinus rhythm Continue on eliquis,  chads2vasc score is 1 Diltiazem for rate control  3. PVD Stable No change required today  Cardiology to be available as needed while here Please call with questions  Thompson Grayer, MD 05/11/2016  11:16 AM

## 2016-05-11 NOTE — Progress Notes (Signed)
Clarified with md about the order for nasal culture who claimed to check on it. But no further order given.

## 2016-05-11 NOTE — Progress Notes (Signed)
Notified md potassium earlier was 3.4 will give 20 meq po.  Will continue to monitor. Saunders Revel T

## 2016-05-11 NOTE — Progress Notes (Signed)
PHARMACY - PHYSICIAN COMMUNICATION CRITICAL VALUE ALERT - BLOOD CULTURE IDENTIFICATION (BCID)  Results for orders placed or performed during the hospital encounter of 05/10/16  Blood Culture ID Panel (Reflexed) (Collected: 05/10/2016  3:40 PM)  Result Value Ref Range   Enterococcus species NOT DETECTED NOT DETECTED   Vancomycin resistance NOT DETECTED NOT DETECTED   Listeria monocytogenes NOT DETECTED NOT DETECTED   Staphylococcus species DETECTED (A) NOT DETECTED   Staphylococcus aureus NOT DETECTED NOT DETECTED   Methicillin resistance DETECTED (A) NOT DETECTED   Streptococcus species NOT DETECTED NOT DETECTED   Streptococcus agalactiae NOT DETECTED NOT DETECTED   Streptococcus pneumoniae NOT DETECTED NOT DETECTED   Streptococcus pyogenes NOT DETECTED NOT DETECTED   Acinetobacter baumannii NOT DETECTED NOT DETECTED   Enterobacteriaceae species NOT DETECTED NOT DETECTED   Enterobacter cloacae complex NOT DETECTED NOT DETECTED   Escherichia coli NOT DETECTED NOT DETECTED   Klebsiella oxytoca NOT DETECTED NOT DETECTED   Klebsiella pneumoniae NOT DETECTED NOT DETECTED   Proteus species NOT DETECTED NOT DETECTED   Serratia marcescens NOT DETECTED NOT DETECTED   Carbapenem resistance NOT DETECTED NOT DETECTED   Haemophilus influenzae NOT DETECTED NOT DETECTED   Neisseria meningitidis NOT DETECTED NOT DETECTED   Pseudomonas aeruginosa NOT DETECTED NOT DETECTED   Candida albicans NOT DETECTED NOT DETECTED   Candida glabrata NOT DETECTED NOT DETECTED   Candida krusei NOT DETECTED NOT DETECTED   Candida parapsilosis NOT DETECTED NOT DETECTED   Candida tropicalis NOT DETECTED NOT DETECTED    Name of physician (or Provider) Contacted: Dr Algis Liming  Changes to prescribed antibiotics required: Vancomycin  Levester Fresh, PharmD, BCPS, [facility] Clinical Pharmacist Pager [phone] 05/11/2016 6:16 PM

## 2016-05-12 DIAGNOSIS — R197 Diarrhea, unspecified: Secondary | ICD-10-CM

## 2016-05-12 LAB — GLUCOSE, CAPILLARY
GLUCOSE-CAPILLARY: 163 mg/dL — AB (ref 65–99)
Glucose-Capillary: 206 mg/dL — ABNORMAL HIGH (ref 65–99)
Glucose-Capillary: 185 mg/dL — ABNORMAL HIGH (ref 65–99)
Glucose-Capillary: 244 mg/dL — ABNORMAL HIGH (ref 65–99)

## 2016-05-12 LAB — CBC
HCT: 29 % — ABNORMAL LOW (ref 39.0–52.0)
HEMOGLOBIN: 9 g/dL — AB (ref 13.0–17.0)
MCH: 27.5 pg (ref 26.0–34.0)
MCHC: 31 g/dL (ref 30.0–36.0)
MCV: 88.7 fL (ref 78.0–100.0)
PLATELETS: 251 10*3/uL (ref 150–400)
RBC: 3.27 MIL/uL — ABNORMAL LOW (ref 4.22–5.81)
RDW: 16 % — ABNORMAL HIGH (ref 11.5–15.5)
WBC: 14.2 10*3/uL — ABNORMAL HIGH (ref 4.0–10.5)

## 2016-05-12 MED ORDER — HYDROCODONE-ACETAMINOPHEN 5-325 MG PO TABS
1.0000 | ORAL_TABLET | Freq: Four times a day (QID) | ORAL | Status: DC | PRN
  Administered 2016-05-13: 1 via ORAL
  Filled 2016-05-12: qty 1

## 2016-05-12 MED ORDER — ENSURE ENLIVE PO LIQD: 237.0000 mL | Freq: Two times a day (BID) | ORAL | Status: DC

## 2016-05-12 MED ORDER — ACETAMINOPHEN 325 MG PO TABS
650.0000 mg | ORAL_TABLET | Freq: Four times a day (QID) | ORAL | Status: DC | PRN
  Filled 2016-05-12: qty 2

## 2016-05-12 MED ORDER — METHYLPREDNISOLONE SODIUM SUCC 125 MG IJ SOLR
60.0000 mg | Freq: Two times a day (BID) | INTRAMUSCULAR | Status: DC
  Administered 2016-05-12 – 2016-05-13 (×2): 60 mg via INTRAVENOUS
  Filled 2016-05-12 (×2): qty 2

## 2016-05-12 MED ORDER — ENSURE ENLIVE PO LIQD
237.0000 mL | Freq: Two times a day (BID) | ORAL | Status: DC
  Administered 2016-05-12 – 2016-05-14 (×5): 237 mL via ORAL

## 2016-05-12 NOTE — Evaluation (Signed)
Clinical/Bedside Swallow Evaluation Patient Details  Name: Kenneth Merritt MRN: [id number] Date of Birth: [birth year]  Today's Date: 05/12/2016 Time: SLP Start Time (ACUTE ONLY): 0802 SLP Stop Time (ACUTE ONLY): 0817 SLP Time Calculation (min) (ACUTE ONLY): 15 min  Past Medical History:  Past Medical History  Diagnosis Date  . GERD (gastroesophageal reflux disease)   . COPD (chronic obstructive pulmonary disease) ([place])     severe by PFTs August 2010  . 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 Atrial fibrillation (HCC)     post-op a-fib. 3/11. post Aortobifem , /  Atrial fibrillation from nebulizer treatment  May, 2012, while hospitalized  . Renal artery stenosis ([place])      presumably corrected at time of aortobifem surgery March, 2011  . Tobacco user      stop smoking  . Pneumothorax      2011  before aortobifemoral surgery,  resolved  . Peripheral vascular disease, unspecified ([place])     bilateral intermittent claudication  . CAD (coronary artery disease), native coronary artery     catheterization 06/2009.Marland KitchenMarland Kitchen70% proximal LAD and 30% proximal RCA. normal LV function, no intervention planned prior to surgery for aorta. LV normal function  by catheter 8/10.   [name] Dyslipidemia   . DJD (degenerative joint disease)   . Ejection fraction     60%, echo, May, 2012, rapid atrial fib at the time  . Cancer [facility]) 2013    Prostate Radiation  . Myocardial infarction ([place]) 2011  . On home O2     2L [place]  . Chronic thoracic back pain   . Chronic pain syndrome    Past Surgical History:  Past Surgical History  Procedure Laterality Date  . Abdominal aortic aneurysm repair  01/11/2010  . Pr vein bypass graft,aorto-fem-pop    . Inguinal hernia repair      RIGHT   HPI:  Kenneth Merritt is a 65 y.o. male with a h/o COPD on home O2, CAD, diastolic dysfunction, and afib now admitted with COPD exacerbation.He had  recent hospitalization 04/21/16-05/01/16 for A. fib with RVR, acute metabolic encephalopathy, elevated troponins, COPD exacerbation, and questionable aspiration.MBS 04/24/16 indicated a mild dysphagia with trace penetration of thin liquids, silent, but clearing with cued throat clear. Dysphagia primarily oral with oral holding and prolonged mastication of solids due to missing dentition. Current CXR without acute findings.    Assessment / Plan / Recommendation Clinical Impression  Results of bedside evaluation consistent with most recent MBS in which patient without overt indication of aspiration with dysphagia 2 solids, thin liquids with cueing for small single bites, intermittent throat clear to clear potentially silent penetrates. Patient continues to endorse difficulty masticating regular texture solids due to missing dentition. Current diet recommendations remain appropriate with use of aspiration precautions. SLP will f/u to reinforce precautions.     Aspiration Risk  Mild aspiration risk    Diet Recommendation Dysphagia 2 (Fine chop);Thin liquid   Liquid Administration via: Cup;No straw Medication Administration: Whole meds with puree Supervision: Patient able to self feed;Intermittent supervision to cue for compensatory strategies Compensations: Slow rate;Small sips/bites;Clear throat intermittently Postural Changes: Seated upright at 90 degrees    Other  Recommendations Oral Care Recommendations: Oral care BID   Follow up Recommendations  None    Frequency and Duration min 2x/week  1 week  Swallow Study   General HPI: Kenneth Merritt is a 65 y.o. male with a h/o COPD on home O2, CAD, diastolic dysfunction, and afib now admitted with COPD exacerbation.He had recent hospitalization 04/21/16-05/01/16 for A. fib with RVR, acute metabolic encephalopathy, elevated troponins, COPD exacerbation, and questionable aspiration.MBS 04/24/16 indicated a mild dysphagia with trace  penetration of thin liquids, silent, but clearing with cued throat clear. Dysphagia primarily oral with oral holding and prolonged mastication of solids due to missing dentition. Current CXR without acute findings.  Type of Study: Bedside Swallow Evaluation Previous Swallow Assessment: see HPI Diet Prior to this Study: Dysphagia 2 (chopped);Thin liquids Temperature Spikes Noted: No Respiratory Status: Nasal cannula History of Recent Intubation: No Behavior/Cognition: Alert;Cooperative;Pleasant mood Oral Cavity Assessment: Within Functional Limits Oral Care Completed by SLP: Recent completion by staff Oral Cavity - Dentition: Edentulous Vision: Functional for self-feeding Self-Feeding Abilities: Able to feed self Patient Positioning: Upright in chair Baseline Vocal Quality: Low vocal intensity Volitional Cough: Strong;Congested Volitional Swallow: Able to elicit    Oral/Motor/Sensory Function Overall Oral Motor/Sensory Function: Within functional limits   Ice Chips Ice chips: Not tested   Thin Liquid Thin Liquid: Within functional limits Presentation: Cup;Self Fed    Nectar Thick Nectar Thick Liquid: Not tested   Honey Thick Honey Thick Liquid: Not tested   Puree Puree: Within functional limits Presentation: Self Fed;Spoon   Solid   GO  Karl Erway MA, CCC-SLP [phone]  Solid: Not tested        Kenneth Merritt 05/12/2016,8:42 AM

## 2016-05-12 NOTE — Progress Notes (Signed)
Physical Therapy Treatment Patient Details Name: Kenneth Merritt MRN: [id number] DOB: 23-Nov-1950 Today's Date: 05/12/2016    History of Present Illness [name] is a 65 y.o. male with a h/o COPD on home O2, CAD, diastolic dysfunction, and afib now admitted with COPD exacerbation.He had recent hospitalization 04/21/16-05/01/16 for A. fib with RVR, acute metabolic encephalopathy, elevated troponins, COPD exacerbation, and questionable aspiration.    PT Comments    Patient progressing with ambulation distance this session.  Feel sure should be able to return home with resuming HHPT services.  Will follow during acute stay.   Follow Up Recommendations  Home health PT     Equipment Recommendations  None recommended by PT    Recommendations for Other Services       Precautions / Restrictions Precautions Precautions: Fall Precaution Comments: O2 dep Restrictions Weight Bearing Restrictions: No    Mobility  Bed Mobility   Bed Mobility: Sit to Supine       Sit to supine: Supervision   General bed mobility comments: cues for positioning, assist for lines  Transfers Overall transfer level: Needs assistance Equipment used: Rolling walker (2 wheeled) Transfers: Sit to/from Stand Sit to Stand: Min guard         General transfer comment: cues for hand placement  Ambulation/Gait Ambulation/Gait assistance: Min guard Ambulation Distance (Feet): 200 Feet Assistive device: Rolling walker (2 wheeled) Gait Pattern/deviations: Step-through pattern;Decreased stride length;Trunk flexed     General Gait Details: two standing rest breaks, ambulated on 3L portable O2 with VSS throughoutl; requesting breathing tx from nurse at end of session; ambulated with flexed trunk and heavy reliance on RW   Stairs            Wheelchair Mobility    Modified Rankin (Stroke Patients Only)       Balance Overall balance assessment: Needs assistance   Sitting balance-Leahy Scale:  Good       Standing balance-Leahy Scale: Poor Standing balance comment: did stand to pivot to bed from recliner without walker and with minguard support for balance touching furniture as he went                    Cognition Arousal/Alertness: Awake/alert Behavior During Therapy: Flat affect Overall Cognitive Status: Within Functional Limits for tasks assessed                      Exercises      General Comments General comments (skin integrity, edema, etc.): ambulated on 3L O2, SpO2 94% after ambulation      Pertinent Vitals/Pain Pain Assessment: No/denies pain Pain Score: 7  Pain Location: R side (ribs) Pain Descriptors / Indicators: Sharp Pain Intervention(s): Monitored during session;Repositioned    Home Living                      Prior Function            PT Goals (current goals can now be found in the care plan section) Progress towards PT goals: Progressing toward goals    Frequency  Min 3X/week    PT Plan Current plan remains appropriate    Co-evaluation             End of Session Equipment Utilized During Treatment: Gait belt;Oxygen Activity Tolerance: Patient tolerated treatment well Patient left: in bed;with call bell/phone within reach     Time: 0938-1001 PT Time Calculation (min) (ACUTE ONLY): 23 min  Charges:  $  Gait Training: 23-37 mins                    G Codes:      Reginia Naas 19-May-2016, 10:27 AM  Magda Kiel, [place] May 19, 2016

## 2016-05-12 NOTE — Evaluation (Signed)
Occupational Therapy Evaluation Patient Details Name: FLAVIAN MCILWAIN MRN: [id number] DOB: 02/08/1951 Today's Date: 05/12/2016    History of Present Illness Kenneth Merritt is a 65 y.o. male with a h/o COPD on home O2, CAD, diastolic dysfunction, and afib now admitted with COPD exacerbation.He had recent hospitalization 04/21/16-05/01/16 for A. fib with RVR, acute metabolic encephalopathy, elevated troponins, COPD exacerbation, and questionable aspiration.   Clinical Impression   Pt admitted with above. He demonstrates the below listed deficits and will benefit from continued OT to maximize safety and independence with BADLs.  Pt presents to OT with generalized weakness, decreased activity tolerance.  He requires min A for ADLs.  Recommend HHOT and 24 hour supervision.  02 sats >96% on 3L supplemental 02.  Will follow       Follow Up Recommendations  Home health OT;Supervision/Assistance - 24 hour    Equipment Recommendations  Tub/shower seat    Recommendations for Other Services       Precautions / Restrictions Precautions Precautions: Fall Precaution Comments: O2 dep      Mobility Bed Mobility Overal bed mobility: Needs Assistance Bed Mobility: Supine to Sit     Supine to sit: Min guard        Transfers Overall transfer level: Needs assistance Equipment used: Rolling walker (2 wheeled) Transfers: Sit to/from [facility] Sit to Stand: Min guard Stand pivot transfers: Min guard       General transfer comment: verbal cues for hand placement and safety.  Min guard for safety     Balance Overall balance assessment: Needs assistance Sitting-balance support: Feet supported Sitting balance-Leahy Scale: Good     Standing balance support: Bilateral upper extremity supported;No upper extremity supported Standing balance-Leahy Scale: Poor Standing balance comment: requires UE support or min A                             ADL Overall ADL's :  Needs assistance/impaired Eating/Feeding: Set up;Sitting   Grooming: Wash/dry hands;Wash/dry face;Oral care;Brushing hair;Min guard;Standing   Upper Body Bathing: Set up;Supervision/ safety;Sitting   Lower Body Bathing: Minimal assistance;Sit to/from stand   Upper Body Dressing : Set up;Supervision/safety;Sitting   Lower Body Dressing: Minimal assistance;Sit to/from stand   Toilet Transfer: Min guard;Ambulation;Comfort height toilet;Grab bars;RW   Toileting- Water quality scientist and Hygiene: Min guard;Sit to/from stand       Functional mobility during ADLs: Min guard;Rolling walker General ADL Comments: Pt fatigues rapidly with activity and requires assist for ADLs due to decreased activity tolerance      Vision Vision Assessment?: No apparent visual deficits   Perception     Praxis      Pertinent Vitals/Pain Pain Assessment: No/denies pain     Hand Dominance Right   Extremity/Trunk Assessment Upper Extremity Assessment Upper Extremity Assessment: Generalized weakness   Lower Extremity Assessment Lower Extremity Assessment: Generalized weakness   Cervical / Trunk Assessment Cervical / Trunk Assessment: Kyphotic   Communication Communication Communication: No difficulties   Cognition Arousal/Alertness: Awake/alert Behavior During Therapy: Flat affect Overall Cognitive Status: Within Functional Limits for tasks assessed                     General Comments       Exercises       Shoulder Instructions      Home Living Family/patient expects to be discharged to:: Private residence Living Arrangements: Spouse/significant other Available Help at Discharge: Family Type of Home:  House Home Access: Stairs to enter [employer] of Steps: 3 Entrance Stairs-Rails: Right Home Layout: Able to live on main level with bedroom/bathroom;Two level     Bathroom Shower/Tub: Tub/shower unit Shower/tub characteristics: Architectural technologist:  Standard     Home Equipment: Environmental consultant - 2 wheels;Cane - single point;Toilet riser          Prior Functioning/Environment Level of Independence: Independent with assistive device(s)        Comments: Pt reports he has required assist with ADLs since last admission     OT Diagnosis: Generalized weakness   OT Problem List: Decreased strength;Decreased activity tolerance;Impaired balance (sitting and/or standing);Decreased safety awareness;Decreased knowledge of use of DME or AE;Cardiopulmonary status limiting activity   OT Treatment/Interventions: Self-care/ADL training;Therapeutic exercise;Energy conservation;DME and/or AE instruction;Therapeutic activities;Patient/family education;Balance training    OT Goals(Current goals can be found in the care plan section) Acute Rehab OT Goals Patient Stated Goal: to breathe better  OT Goal Formulation: With patient Time For Goal Achievement: 05/26/16 Potential to Achieve Goals: Good  OT Frequency: Min 2X/week   Barriers to D/C:            Co-evaluation              End of Session Equipment Utilized During Treatment: Gait belt;Rolling walker;Oxygen Nurse Communication: Mobility status  Activity Tolerance: Patient limited by fatigue Patient left: in chair;with call bell/phone within reach   Time: [id number] OT Time Calculation (min): 27 min Charges:  OT General Charges $OT Visit: 1 Procedure OT Evaluation $OT Eval Moderate Complexity: 1 Procedure OT Treatments $Therapeutic Activity: 8-22 mins G-Codes:    Angelo Caroll M 2016/05/21, 4:32 PM

## 2016-05-12 NOTE — Progress Notes (Signed)
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 fib,  chronic diastolic CHF,former smoker, CAD, HLD, chronic pain, prostate cancer status post radiation, recent hospitalization 04/21/16-05/01/16 for A. fib with RVR, acute metabolic encephalopathy, elevated troponins, COPD exacerbation, questionable aspiration, presented to [facility] ED on 05/10/16 with complaints of worsening dyspnea, productive cough, hypoxia with oxygenation in the 80s on 2 L/m. In ED, low-grade fever, tachycardia, tachypnea, declined BiPAP and placed on NRB, mildly elevated troponin. Admitted to stepdown unit for acute on chronic hypoxic and hypercapnic respiratory failure and COPD exacerbation. Cardiology consulted.    Assessment & Plan:   Active Problems:   Sepsis ([place])   SOB (shortness of breath)  COPD exacerbation - May be infectious exacerbation - Treated with oxygen, bronchodilators, IV Solu-Medrol. Received a dose of Zosyn and vancomycin in ED which were not continued in the hospital due to concern for diarrhea from C. Difficile. - Added flutter valve. - Patient has opted for partial code: He confirms DO NOT INTUBATE and does not want BiPAP either. - Clinically improving. Reduce IV Solu-Medrol and may transition to oral prednisone taper tomorrow. - Sputum culture pending. One of 2 blood cultures positive for coagulase-negative staph.  Acute on chronic respiratory failure with hypoxia and hypercapnia - Likely from COPD exacerbation. Not overtly volume overloaded. Management as above. Clinically improving. Titrate for oxygen saturation between 88-92 percent. Patient back on home oxygen 3 L/m.  One of 2 blood cultures  positive for coagulase negative staph - IV vancomycin initiated 7/2. Possibly a contaminant. Consider stopping tomorrow if second blood culture continues to be negative.  Chronic diastolic CHF - Clinically compensated.  SIRS - Present on admission with temperature 100.9, leukocytosis, tachycardia, hypoxia and tachypnea. - UA not suggestive of UTI. Blood cultures pending. Pro-calcitonin minimally elevated. Lactate normal. Chest x-ray without active disease. - Antibiotics were not continued in the hospital due to concern for C. Difficile.  Diarrhea, R/O C. Difficile - As per patient, diarrhea was decreasing even prior to hospital admission. No BM since 05/09/16. - Unable to send stool for C. difficile because he has not had a BM. DC oral vancomycin and contact precautions.  Elevated troponin/CAD - Secondary to acute illness and possible demand ischemia. No chest pain reported. EKG without acute changes. Continue statins. Cardiology input appreciated: Elevated troponins likely due to demand ischemia from acute respiratory failure. Poor candidate for beta blocker given lung disease. Avoid aspirin due to being on Eliquis. No further CV testing recommended unless marked increase in troponin or development of anginal symptoms. Cardiology signed off 7/2.  Paroxysmal atrial fibrillation - Remains in sinus rhythm. Continue calcium channel blockers and Apixaban  Type II DM - A1c 6.6 on 04/2016. SSI.  Dysphagia - Resume dysphagia 2 diet and thin liquids as per recent swallow evaluation. Speech therapy recommends continued same diet.  Anemia - No bleeding reported. Baseline hemoglobin said to be in 10-11. Follow CBCs. Outpatient follow-up with [facility]. Stable.  Severe protein calorie malnutrition  Hypokalemia - Replaced.    DVT prophylaxis: Eliquis Code Status: Partial Family Communication:  Discussed with patient. No family at bedside. Disposition Plan:  DC home when medically stable.  Admitted to  step down unit. Transfer to medical bed on 7/3. Possible DC home in 1-2 days.  Consultants:  Cardiology-seen and signed off 7/2.   Procedures:   None  Antimicrobials:   IV Zosyn-discontinued  Oral vancomycin -discontinued  IV vancomycin 7/2 >   Subjective: Chronic dyspnea back to baseline. Mild nonproductive cough. No BM since admission.   Objective:  Filed Vitals:   05/12/16 0154 05/12/16 0156 05/12/16 0331 05/12/16 0746  BP:   100/71 117/75  Pulse:   89 97  Temp:   97.7 F (36.5 C) 98 F (36.7 C)  TempSrc:   Oral Oral  Resp:   14 14  Height:      Weight:   57.335 kg (126 lb 6.4 oz)   SpO2: 100% 100% 100% 99%    Intake/Output Summary (Last 24 hours) at 05/12/16 1033 Last data filed at 05/12/16 0900  Gross per 24 hour  Intake   1010 ml  Output   1350 ml  Net   -340 ml   Filed Weights   05/10/16 2001 05/11/16 0300 05/12/16 0331  Weight: 56.926 kg (125 lb 8 oz) 57.063 kg (125 lb 12.8 oz) 57.335 kg (126 lb 6.4 oz)    Examination:  General exam: Pleasant middle-aged male, frail, chronically looking, sitting up comfortably in chair this morning.  Respiratory system: globally diminished breath sounds but clear to auscultation. Respiratory effort normal. Able to speak in full sentences. Cardiovascular system: S1 & S2 heard, RRR. No JVD, murmurs, rubs, gallops or clicks. No pedal edema. Telemetry: Sinus rhythm. Gastrointestinal system: Abdomen is nondistended, soft and nontender. No organomegaly or masses felt. Normal bowel sounds heard. Central nervous system: Alert and oriented. No focal neurological deficits. Extremities: Symmetric 5 x 5 power. Skin: No rashes, lesions or ulcers Psychiatry: Judgement and insight appear normal. Mood & affect appropriate.     Data Reviewed: I have personally reviewed following labs and imaging studies  CBC:  Recent Labs Lab 05/10/16 1540 05/11/16 0601 05/12/16 0450  WBC 14.3* 12.0* 14.2*  NEUTROABS 11.9*   --   --   HGB 11.0* 9.5* 9.0*  HCT 34.9* 30.3* 29.0*  MCV 91.4 89.4 88.7  PLT 226 212 [id number]   Basic Metabolic Panel:  Recent Labs Lab 05/10/16 1540 05/11/16 0601  NA 135 134*  K 3.4* 3.8  CL 96* 95*  CO2 27 29  GLUCOSE 94 147*  BUN <5* <5*  CREATININE 0.68 0.56*  CALCIUM 7.9* 7.6*  MG  --  2.2   GFR: Estimated Creatinine Clearance: 75.6 mL/min (by C-G formula based on Cr of 0.56). Liver Function Tests:  Recent Labs Lab 05/10/16 1540 05/11/16 0601  AST 28 22  ALT 46 41  ALKPHOS 83 75  BILITOT 1.9* 1.4*  PROT 5.7* 5.0*  ALBUMIN 2.4* 1.9*   No results for input(s): LIPASE, AMYLASE in the last 168 hours. No results for input(s): AMMONIA in the last 168 hours. Coagulation Profile:  Recent Labs Lab 05/11/16 0601  INR 2.15*  Elevated INR likely related to Eliquis and of no consequence.   Cardiac Enzymes:  Recent Labs Lab 05/10/16 1540 05/10/16 1730 05/10/16 2312 05/11/16 0601 05/11/16 1328  TROPONINI 0.59* 0.49* 0.38* 0.22* 0.75*   BNP (last 3 results) No results for input(s): PROBNP in the last 8760 hours. HbA1C: No results for input(s): HGBA1C in the last 72 hours. CBG:  Recent Labs Lab 05/11/16 0811 05/11/16 1209 05/11/16 1638 05/11/16 2119 05/12/16 0820  GLUCAP 153* 182* 146* 170* 163*  Lipid Profile: No results for input(s): CHOL, HDL, LDLCALC, TRIG, CHOLHDL, LDLDIRECT in the last 72 hours. Thyroid Function Tests: No results for input(s): TSH, T4TOTAL, FREET4, T3FREE, THYROIDAB in the last 72 hours. Anemia Panel: No results for input(s): VITAMINB12, FOLATE, FERRITIN, TIBC, IRON, RETICCTPCT in the last 72 hours.  Sepsis Labs:  Recent Labs Lab 05/10/16 1556 05/10/16 1730 05/10/16 2043 05/10/16 2055 05/10/16 2312  PROCALCITON  --   --  0.37  --   --   LATICACIDVEN 1.51 1.2  --  1.0 1.0  0.9    Recent Results (from the past 240 hour(s))  Blood Culture (routine x 2)     Status: Abnormal (Preliminary result)   Collection Time:  05/10/16  3:40 PM  Result Value Ref Range Status   Specimen Description BLOOD RIGHT ANTECUBITAL  Final   Special Requests BOTTLES DRAWN AEROBIC AND ANAEROBIC 5CC  Final   Culture  Setup Time   Final    GRAM POSITIVE COCCI IN CLUSTERS IN BOTH AEROBIC AND ANAEROBIC BOTTLES CRITICAL RESULT CALLED TO, READ BACK BY AND VERIFIED WITH: M MACCIA,PHARMD AT 1726 05/11/16 BY L BENFIELD    Culture STAPHYLOCOCCUS SPECIES (COAGULASE NEGATIVE) (A)  Final   Report Status PENDING  Incomplete  Blood Culture ID Panel (Reflexed)     Status: Abnormal   Collection Time: 05/10/16  3:40 PM  Result Value Ref Range Status   Enterococcus species NOT DETECTED NOT DETECTED Final   Vancomycin resistance NOT DETECTED NOT DETECTED Final   Listeria monocytogenes NOT DETECTED NOT DETECTED Final   Staphylococcus species DETECTED (A) NOT DETECTED Final    Comment: CRITICAL RESULT CALLED TO, READ BACK BY AND VERIFIED WITH: M MACCIA,PHARMD AT 1726 05/11/16 BY L BENFIELD    Staphylococcus aureus NOT DETECTED NOT DETECTED Final   Methicillin resistance DETECTED (A) NOT DETECTED Final    Comment: CRITICAL RESULT CALLED TO, READ BACK BY AND VERIFIED WITH: M MACCIA,PHARMD AT 1726 05/11/16 BY L BENFIELD    Streptococcus species NOT DETECTED NOT DETECTED Final   Streptococcus agalactiae NOT DETECTED NOT DETECTED Final   Streptococcus pneumoniae NOT DETECTED NOT DETECTED Final   Streptococcus pyogenes NOT DETECTED NOT DETECTED Final   Acinetobacter baumannii NOT DETECTED NOT DETECTED Final   Enterobacteriaceae species NOT DETECTED NOT DETECTED Final   Enterobacter cloacae complex NOT DETECTED NOT DETECTED Final   Escherichia coli NOT DETECTED NOT DETECTED Final   Klebsiella oxytoca NOT DETECTED NOT DETECTED Final   Klebsiella pneumoniae NOT DETECTED NOT DETECTED Final   Proteus species NOT DETECTED NOT DETECTED Final   Serratia marcescens NOT DETECTED NOT DETECTED Final   Carbapenem resistance NOT DETECTED NOT DETECTED Final    Haemophilus influenzae NOT DETECTED NOT DETECTED Final   Neisseria meningitidis NOT DETECTED NOT DETECTED Final   Pseudomonas aeruginosa NOT DETECTED NOT DETECTED Final   Candida albicans NOT DETECTED NOT DETECTED Final   Candida glabrata NOT DETECTED NOT DETECTED Final   Candida krusei NOT DETECTED NOT DETECTED Final   Candida parapsilosis NOT DETECTED NOT DETECTED Final   Candida tropicalis NOT DETECTED NOT DETECTED Final  Urine culture     Status: Abnormal   Collection Time: 05/10/16  3:45 PM  Result Value Ref Range Status   Specimen Description URINE, RANDOM  Final   Special Requests NONE  Final   Culture <10,000 COLONIES/mL INSIGNIFICANT GROWTH (A)  Final   Report Status 05/11/2016 FINAL  Final  Blood Culture (routine x 2)     Status: None (Preliminary result)  Collection Time: 05/10/16  8:55 PM  Result Value Ref Range Status   Specimen Description BLOOD LEFT HAND  Final   Special Requests IN PEDIATRIC BOTTLE 1CC  Final   Culture NO GROWTH < 24 HOURS  Final   Report Status PENDING  Incomplete  Culture, expectorated sputum-assessment     Status: None   Collection Time: 05/11/16  1:49 PM  Result Value Ref Range Status   Specimen Description SPUTUM  Final   Special Requests NONE  Final   Sputum evaluation   Final    THIS SPECIMEN IS ACCEPTABLE. RESPIRATORY CULTURE REPORT TO FOLLOW.   Report Status 05/11/2016 FINAL  Final  Culture, respiratory (NON-Expectorated)     Status: None (Preliminary result)   Collection Time: 05/11/16  1:49 PM  Result Value Ref Range Status   Specimen Description SPUTUM  Final   Special Requests NONE  Final   Gram Stain   Final    MODERATE WBC PRESENT,BOTH PMN AND MONONUCLEAR FEW SQUAMOUS EPITHELIAL CELLS PRESENT ABUNDANT GRAM POSITIVE RODS RARE GRAM NEGATIVE RODS RARE BUDDING YEAST SEEN    Culture PENDING  Incomplete   Report Status PENDING  Incomplete         Radiology Studies: Dg Chest Portable 1 View  05/10/2016  CLINICAL DATA:  Four  weeks of wheezing chest pain and shortness of breath. EXAM: PORTABLE CHEST 1 VIEW COMPARISON:  04/25/2016 FINDINGS: Lordotic positioning. The lungs are clear wiithout focal pneumonia, edema, pneumothorax or pleural effusion. Biapical pleural-parenchymal scarring again noted. Interstitial markings are diffusely coarsened with chronic features. The cardiopericardial silhouette is within normal limits for size. Telemetry leads overlie the chest. IMPRESSION: No active disease. Electronically Signed   By: Misty Stanley M.D.   On: 05/10/2016 16:07        Scheduled Meds: . apixaban  5 mg Oral BID  . diltiazem  240 mg Oral Daily  . famotidine  20 mg Oral BID  . gabapentin  300 mg Oral TID  . guaiFENesin  600 mg Oral BID  . insulin aspart  0-5 Units Subcutaneous QHS  . insulin aspart  0-9 Units Subcutaneous TID WC  . ipratropium  0.5 mg Nebulization Q6H  . levalbuterol  0.63 mg Nebulization Q6H  . methylPREDNISolone (SOLU-MEDROL) injection  60 mg Intravenous Q12H  . sodium chloride flush  3 mL Intravenous Q12H  . vancomycin  750 mg Intravenous Q12H   Continuous Infusions:    LOS: 2 days    Time spent: 25 minutes.    [facility], MD [facility] Pager [phone]  If 7PM-7AM, please contact night-coverage [email or web address] Password [email or web address] 05/12/2016, 10:33 AM

## 2016-05-12 NOTE — Progress Notes (Signed)
Initial Nutrition Assessment  DOCUMENTATION CODES:   Non-severe (moderate) malnutrition in context of chronic illness  INTERVENTION:   -Ensure Enlive po BID, each supplement provides 350 kcal and 20 grams of protein  NUTRITION DIAGNOSIS:   Malnutrition related to chronic illness as evidenced by mild depletion of body fat, mild depletion of muscle mass, percent weight loss.  GOAL:   Patient will meet greater than or equal to 90% of their needs  MONITOR:   PO intake, Supplement acceptance, Vent status, Labs, Weight trends, Skin, I & O's  REASON FOR ASSESSMENT:   Malnutrition Screening Tool, Consult Assessment of nutrition requirement/status  ASSESSMENT:   FINCH PFUHL is a 65 y.o. male with a h/o COPD on home O2, CAD, diastolic dysfunction, and afib now admitted with COPD exacerbation.   Pt admitted with COPD exacerbation.   Spoke with pt at bedside, who was minimally interactive with this RD (not making eye contact and answering questions with short answers). He reports very poor appetite over at least the past 2 weeks ("Ive eaten almost nothing"). He reports he consumed about 50% of his breakfast this morning. Pt underwent BSE this morning and was recommended a dysphagia 2 diet with thin liquids. Pt reports good tolerance with current diet textures.   Pt endorses weight loss, reporting UBW of 145#. Reviewed wt hx; noted pt has experienced a 13.1% wt loss over the past 6 months, which is significant for time frame.   Nutrition-Focused physical exam completed. Findings are mild to moferate fat depletion, mild to modrate muscle depletion, and no edema.   Discussed importance of good meal and supplements acceptance. Pt is amenable to try Ensure, reporting he was going to purchase some PTA due to poor PO intake.   Labs reviewed: CBGS: 146-170.   Diet Order:  DIET DYS 2 Room service appropriate?: Yes; Fluid consistency:: Thin  Skin:  Reviewed, no issues  Last BM:   05/10/16  Height:   Ht Readings from Last 1 Encounters:  05/10/16 5\' 4"  (1.626 m)    Weight:   Wt Readings from Last 1 Encounters:  05/12/16 126 lb 6.4 oz (57.335 kg)    Ideal Body Weight:  59.1 kg  BMI:  Body mass index is 21.69 kg/(m^2).  Estimated Nutritional Needs:   Kcal:  1700-1900  Protein:  80-95 grams  Fluid:  1.7-1.9 L  EDUCATION NEEDS:   Education needs addressed  Dannae Kato A. Jimmye Norman, RD, LDN, CDE Pager: [phone] After hours Pager: [phone]

## 2016-05-13 DIAGNOSIS — E440 Moderate protein-calorie malnutrition: Secondary | ICD-10-CM | POA: Diagnosis present

## 2016-05-13 LAB — CULTURE, BLOOD (ROUTINE X 2)

## 2016-05-13 LAB — LEGIONELLA PNEUMOPHILA SEROGP 1 UR AG: L. pneumophila Serogp 1 Ur Ag: NEGATIVE

## 2016-05-13 LAB — GLUCOSE, CAPILLARY
GLUCOSE-CAPILLARY: 239 mg/dL — AB (ref 65–99)
GLUCOSE-CAPILLARY: 197 mg/dL — AB (ref 65–99)
Glucose-Capillary: 178 mg/dL — ABNORMAL HIGH (ref 65–99)
Glucose-Capillary: 235 mg/dL — ABNORMAL HIGH (ref 65–99)

## 2016-05-13 MED ORDER — LEVALBUTEROL HCL 0.63 MG/3ML IN NEBU
0.6300 mg | INHALATION_SOLUTION | Freq: Four times a day (QID) | RESPIRATORY_TRACT | Status: DC
  Administered 2016-05-13 (×2): 0.63 mg via RESPIRATORY_TRACT
  Filled 2016-05-13 (×2): qty 3

## 2016-05-13 MED ORDER — DOXYCYCLINE HYCLATE 100 MG PO TABS
100.0000 mg | ORAL_TABLET | Freq: Two times a day (BID) | ORAL | Status: DC
  Administered 2016-05-13 – 2016-05-14 (×3): 100 mg via ORAL
  Filled 2016-05-13 (×3): qty 1

## 2016-05-13 MED ORDER — PREDNISONE 20 MG PO TABS
40.0000 mg | ORAL_TABLET | Freq: Every day | ORAL | Status: DC
  Administered 2016-05-13 – 2016-05-14 (×2): 40 mg via ORAL
  Filled 2016-05-13 (×2): qty 2

## 2016-05-13 NOTE — Progress Notes (Signed)
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 fib,  chronic diastolic CHF,former smoker, CAD, HLD, chronic pain, prostate cancer status post radiation, recent hospitalization 04/21/16-05/01/16 for A. fib with RVR, acute metabolic encephalopathy, elevated troponins, COPD exacerbation, questionable aspiration, presented to [facility] ED on 05/10/16 with complaints of worsening dyspnea, productive cough, hypoxia with oxygenation in the 80s on 2 L/m. In ED, low-grade fever, tachycardia, tachypnea, declined BiPAP and placed on NRB, mildly elevated troponin. Admitted to stepdown unit for acute on chronic hypoxic and hypercapnic respiratory failure and COPD exacerbation. Improved. Transfer to floor 7/3. Possible DC home with home health services and DME on 7/5.   Assessment & Plan:   Active Problems:   Sepsis ([place])   SOB (shortness of breath)   Malnutrition of moderate degree  COPD exacerbation - May be infectious exacerbation - Treated with oxygen, bronchodilators, IV Solu-Medrol. Received a dose of Zosyn and vancomycin in ED which were not continued in the hospital due to concern for diarrhea from C. Difficile. - Added flutter valve. - Patient has opted for partial code: He confirms DO NOT INTUBATE and does not want BiPAP either. - Clinically improving. IV Solu-Medrol was tapered. Transition to oral prednisone today. - Sputum culture: Moderate yeast. One of 2 blood cultures positive for coagulase-negative staph. - Start PO Doxycycline d/t to productive cough and complete 5 days Rx.  Acute on chronic respiratory failure with hypoxia and hypercapnia - Likely from COPD exacerbation. Not overtly volume  overloaded. Management as above. Clinically improving. Titrate for oxygen saturation between 88-92 percent. Patient back on home oxygen 3 L/m.  One of 2 blood cultures positive for coagulase negative staph, likely contaminant - Empirically started IV vancomycin will be discontinued 7/4.  Chronic diastolic CHF - Clinically compensated.  SIRS - Present on admission with temperature 100.9, leukocytosis, tachycardia, hypoxia and tachypnea. - UA not suggestive of UTI. Blood cultures pending. Pro-calcitonin minimally elevated. Lactate normal. Chest x-ray without active disease. - Antibiotics were not continued in the hospital due to concern for C. Difficile. - SIRS resolved  Diarrhea, R/O C. Difficile - As per patient, diarrhea was decreasing even prior to hospital admission. No BM since 05/09/16. - Unable to send stool for C. difficile because he has not had a BM. DCed oral vancomycin and contact precautions.  Elevated troponin/CAD - Secondary to acute illness and possible demand ischemia. No chest pain reported. EKG without acute changes. Continue statins. Cardiology input appreciated: Elevated troponins likely due to demand ischemia from acute respiratory failure. Poor candidate for beta blocker given lung disease. Avoid aspirin due to being on Eliquis. No further CV testing recommended unless marked increase in troponin or development of anginal symptoms. Cardiology signed off 7/2.  Paroxysmal atrial fibrillation - Remains in sinus rhythm. Continue calcium channel blockers and Apixaban  Type II DM - A1c 6.6 on 04/2016. SSI.  Dysphagia - Resume dysphagia 2 diet and thin liquids as per recent swallow evaluation. Speech therapy recommends continued same diet.  Anemia - No bleeding reported. Baseline hemoglobin said to be in 10-11. Follow CBCs. Outpatient follow-up with [facility]. Stable.  Severe protein calorie malnutrition  Hypokalemia - Replaced.    DVT  prophylaxis: Eliquis Code  Status: Partial Family Communication:  Discussed with patient. No family at bedside. Disposition Plan:  DC home when medically stable. Admitted to step down unit. Transferred to medical bed on 7/3. Possible DC home 7/5  Consultants:  Cardiology-seen and signed off 7/2.   Procedures:   None  Antimicrobials:   IV Zosyn-discontinued  Oral vancomycin -discontinued  IV vancomycin 7/2 >7/3   Subjective: Chronic dyspnea back to baseline. Cough productive of some yellow sputum. No chest pain.   Objective:  Filed Vitals:   05/13/16 0210 05/13/16 0524 05/13/16 0613 05/13/16 0849  BP:  100/56    Pulse:  84    Temp:  97.6 F (36.4 C)    TempSrc:  Oral    Resp:  16    Height:      Weight:      SpO2: 92% 96% 97% 91%    Intake/Output Summary (Last 24 hours) at 05/13/16 1326 Last data filed at 05/13/16 1047  Gross per 24 hour  Intake    240 ml  Output      0 ml  Net    240 ml   Filed Weights   05/11/16 0300 05/12/16 0331 05/12/16 1701  Weight: 57.063 kg (125 lb 12.8 oz) 57.335 kg (126 lb 6.4 oz) 58.333 kg (128 lb 9.6 oz)    Examination:  General exam: Pleasant middle-aged male, frail, chronically looking, sitting up comfortably in bed eating breakfast this morning.  Respiratory system: globally diminished breath sounds but clear to auscultation. Respiratory effort normal. Able to speak in full sentences. Cardiovascular system: S1 & S2 heard, RRR. No JVD, murmurs, rubs, gallops or clicks. No pedal edema. Telemetry: Sinus rhythm. Gastrointestinal system: Abdomen is nondistended, soft and nontender. No organomegaly or masses felt. Normal bowel sounds heard. Central nervous system: Alert and oriented. No focal neurological deficits. Extremities: Symmetric 5 x 5 power. Skin: No rashes, lesions or ulcers Psychiatry: Judgement and insight appear normal. Mood & affect appropriate.     Data Reviewed: I have personally reviewed following labs and imaging studies  CBC:  Recent  Labs Lab 05/10/16 1540 05/11/16 0601 05/12/16 0450  WBC 14.3* 12.0* 14.2*  NEUTROABS 11.9*  --   --   HGB 11.0* 9.5* 9.0*  HCT 34.9* 30.3* 29.0*  MCV 91.4 89.4 88.7  PLT 226 212 [id number]   Basic Metabolic Panel:  Recent Labs Lab 05/10/16 1540 05/11/16 0601  NA 135 134*  K 3.4* 3.8  CL 96* 95*  CO2 27 29  GLUCOSE 94 147*  BUN <5* <5*  CREATININE 0.68 0.56*  CALCIUM 7.9* 7.6*  MG  --  2.2   GFR: Estimated Creatinine Clearance: 76.9 mL/min (by C-G formula based on Cr of 0.56). Liver Function Tests:  Recent Labs Lab 05/10/16 1540 05/11/16 0601  AST 28 22  ALT 46 41  ALKPHOS 83 75  BILITOT 1.9* 1.4*  PROT 5.7* 5.0*  ALBUMIN 2.4* 1.9*   No results for input(s): LIPASE, AMYLASE in the last 168 hours. No results for input(s): AMMONIA in the last 168 hours. Coagulation Profile:  Recent Labs Lab 05/11/16 0601  INR 2.15*  Elevated INR likely related to Eliquis and of no consequence.   Cardiac Enzymes:  Recent Labs Lab 05/10/16 1540 05/10/16 1730 05/10/16 2312 05/11/16 0601 05/11/16 1328  TROPONINI 0.59* 0.49* 0.38* 0.22* 0.75*   BNP (last 3 results) No results for input(s): PROBNP in the last 8760 hours. HbA1C: No results for input(s): HGBA1C in the last 72  hours. CBG:  Recent Labs Lab 05/12/16 1207 05/12/16 1705 05/12/16 2222 05/13/16 0755 05/13/16 1148  GLUCAP 206* 185* 244* 178* 239*   Lipid Profile: No results for input(s): CHOL, HDL, LDLCALC, TRIG, CHOLHDL, LDLDIRECT in the last 72 hours. Thyroid Function Tests: No results for input(s): TSH, T4TOTAL, FREET4, T3FREE, THYROIDAB in the last 72 hours. Anemia Panel: No results for input(s): VITAMINB12, FOLATE, FERRITIN, TIBC, IRON, RETICCTPCT in the last 72 hours.  Sepsis Labs:  Recent Labs Lab 05/10/16 1556 05/10/16 1730 05/10/16 2043 05/10/16 2055 05/10/16 2312  PROCALCITON  --   --  0.37  --   --   LATICACIDVEN 1.51 1.2  --  1.0 1.0  0.9    Recent Results (from the past 240  hour(s))  Blood Culture (routine x 2)     Status: Abnormal   Collection Time: 05/10/16  3:40 PM  Result Value Ref Range Status   Specimen Description BLOOD RIGHT ANTECUBITAL  Final   Special Requests BOTTLES DRAWN AEROBIC AND ANAEROBIC 5CC  Final   Culture  Setup Time   Final    GRAM POSITIVE COCCI IN CLUSTERS IN BOTH AEROBIC AND ANAEROBIC BOTTLES CRITICAL RESULT CALLED TO, READ BACK BY AND VERIFIED WITH: M MACCIA,PHARMD AT 1726 05/11/16 BY L BENFIELD    Culture (A)  Final    STAPHYLOCOCCUS SPECIES (COAGULASE NEGATIVE) THE SIGNIFICANCE OF ISOLATING THIS ORGANISM FROM A SINGLE SET OF BLOOD CULTURES WHEN MULTIPLE SETS ARE DRAWN IS UNCERTAIN. PLEASE NOTIFY THE MICROBIOLOGY DEPARTMENT WITHIN ONE WEEK IF SPECIATION AND SENSITIVITIES ARE REQUIRED.    Report Status 05/13/2016 FINAL  Final  Blood Culture ID Panel (Reflexed)     Status: Abnormal   Collection Time: 05/10/16  3:40 PM  Result Value Ref Range Status   Enterococcus species NOT DETECTED NOT DETECTED Final   Vancomycin resistance NOT DETECTED NOT DETECTED Final   Listeria monocytogenes NOT DETECTED NOT DETECTED Final   Staphylococcus species DETECTED (A) NOT DETECTED Final    Comment: CRITICAL RESULT CALLED TO, READ BACK BY AND VERIFIED WITH: M MACCIA,PHARMD AT 1726 05/11/16 BY L BENFIELD    Staphylococcus aureus NOT DETECTED NOT DETECTED Final   Methicillin resistance DETECTED (A) NOT DETECTED Final    Comment: CRITICAL RESULT CALLED TO, READ BACK BY AND VERIFIED WITH: M MACCIA,PHARMD AT 1726 05/11/16 BY L BENFIELD    Streptococcus species NOT DETECTED NOT DETECTED Final   Streptococcus agalactiae NOT DETECTED NOT DETECTED Final   Streptococcus pneumoniae NOT DETECTED NOT DETECTED Final   Streptococcus pyogenes NOT DETECTED NOT DETECTED Final   Acinetobacter baumannii NOT DETECTED NOT DETECTED Final   Enterobacteriaceae species NOT DETECTED NOT DETECTED Final   Enterobacter cloacae complex NOT DETECTED NOT DETECTED Final    Escherichia coli NOT DETECTED NOT DETECTED Final   Klebsiella oxytoca NOT DETECTED NOT DETECTED Final   Klebsiella pneumoniae NOT DETECTED NOT DETECTED Final   Proteus species NOT DETECTED NOT DETECTED Final   Serratia marcescens NOT DETECTED NOT DETECTED Final   Carbapenem resistance NOT DETECTED NOT DETECTED Final   Haemophilus influenzae NOT DETECTED NOT DETECTED Final   Neisseria meningitidis NOT DETECTED NOT DETECTED Final   Pseudomonas aeruginosa NOT DETECTED NOT DETECTED Final   Candida albicans NOT DETECTED NOT DETECTED Final   Candida glabrata NOT DETECTED NOT DETECTED Final   Candida krusei NOT DETECTED NOT DETECTED Final   Candida parapsilosis NOT DETECTED NOT DETECTED Final   Candida tropicalis NOT DETECTED NOT DETECTED Final  Urine culture     Status:  Abnormal   Collection Time: 05/10/16  3:45 PM  Result Value Ref Range Status   Specimen Description URINE, RANDOM  Final   Special Requests NONE  Final   Culture <10,000 COLONIES/mL INSIGNIFICANT GROWTH (A)  Final   Report Status 05/11/2016 FINAL  Final  Blood Culture (routine x 2)     Status: None (Preliminary result)   Collection Time: 05/10/16  8:55 PM  Result Value Ref Range Status   Specimen Description BLOOD LEFT HAND  Final   Special Requests IN PEDIATRIC BOTTLE 1CC  Final   Culture NO GROWTH 3 DAYS  Final   Report Status PENDING  Incomplete  Culture, expectorated sputum-assessment     Status: None   Collection Time: 05/11/16  1:49 PM  Result Value Ref Range Status   Specimen Description SPUTUM  Final   Special Requests NONE  Final   Sputum evaluation   Final    THIS SPECIMEN IS ACCEPTABLE. RESPIRATORY CULTURE REPORT TO FOLLOW.   Report Status 05/11/2016 FINAL  Final  Culture, respiratory (NON-Expectorated)     Status: None (Preliminary result)   Collection Time: 05/11/16  1:49 PM  Result Value Ref Range Status   Specimen Description SPUTUM  Final   Special Requests NONE  Final   Gram Stain   Final     MODERATE WBC PRESENT,BOTH PMN AND MONONUCLEAR FEW SQUAMOUS EPITHELIAL CELLS PRESENT ABUNDANT GRAM POSITIVE RODS RARE GRAM NEGATIVE RODS RARE BUDDING YEAST SEEN    Culture MODERATE YEAST IDENTIFICATION TO FOLLOW   Final   Report Status PENDING  Incomplete         Radiology Studies: No results found.      Scheduled Meds: . apixaban  5 mg Oral BID  . diltiazem  240 mg Oral Daily  . famotidine  20 mg Oral BID  . feeding supplement (ENSURE ENLIVE)  237 mL Oral BID BM  . gabapentin  300 mg Oral TID  . guaiFENesin  600 mg Oral BID  . insulin aspart  0-5 Units Subcutaneous QHS  . insulin aspart  0-9 Units Subcutaneous TID WC  . ipratropium  0.5 mg Nebulization Q6H  . levalbuterol  0.63 mg Nebulization Q6H  . methylPREDNISolone (SOLU-MEDROL) injection  60 mg Intravenous Q12H  . sodium chloride flush  3 mL Intravenous Q12H  . vancomycin  750 mg Intravenous Q12H   Continuous Infusions:    LOS: 3 days    Time spent: 25 minutes.    [facility], MD [facility] Pager [phone]  If 7PM-7AM, please contact night-coverage [email or web address] Password [email or web address] 05/13/2016, 1:26 PM

## 2016-05-13 NOTE — Discharge Instructions (Signed)

## 2016-05-13 NOTE — Progress Notes (Signed)
Occupational Therapy Treatment Patient Details Name: Kenneth Merritt MRN: [id number] DOB: 1951-06-12 Today's Date: 05/13/2016    History of present illness Kenneth Merritt is a 65 y.o. male with a h/o COPD on home O2, CAD, diastolic dysfunction, and afib now admitted with COPD exacerbation.He had recent hospitalization 04/21/16-05/01/16 for A. fib with RVR, acute metabolic encephalopathy, elevated troponins, COPD exacerbation, and questionable aspiration.   OT comments  Pt continues to require min A, overall for ADLs due to fatigue and DOE 3/4 - 4/4 with activity.   Seems more fatigued this pm, compared to yesterday.  Will continue to follow. Requires cues and reinforcement for pursed lip breathing   Follow Up Recommendations  Home health OT;Supervision/Assistance - 24 hour    Equipment Recommendations  Tub/shower seat    Recommendations for Other Services      Precautions / Restrictions Precautions Precautions: Fall Precaution Comments: O2 dep       Mobility Bed Mobility Overal bed mobility: Needs Assistance Bed Mobility: Sit to Supine       Sit to supine: Supervision      Transfers Overall transfer level: Needs assistance Equipment used: Rolling walker (2 wheeled) Transfers: Sit to/from [facility] Sit to Stand: Supervision Stand pivot transfers: Supervision            Balance Overall balance assessment: Needs assistance Sitting-balance support: Feet supported Sitting balance-Leahy Scale: Good     Standing balance support: Bilateral upper extremity supported Standing balance-Leahy Scale: Poor Standing balance comment: reliant on UE support                    ADL Overall ADL's : Needs assistance/impaired                                       General ADL Comments: Requires min A for ADLs due to fatigue with activity       Vision                     Perception     Praxis      Cognition   Behavior During  Therapy: Flat affect Overall Cognitive Status: Within Functional Limits for tasks assessed                       Extremity/Trunk Assessment               Exercises     Shoulder Instructions       General Comments      Pertinent Vitals/ Pain       Pain Assessment: No/denies pain  Home Living                                          Prior Functioning/Environment              Frequency Min 2X/week     Progress Toward Goals  OT Goals(current goals can now be found in the care plan section)  Progress towards OT goals: Progressing toward goals  ADL Goals Pt Will Perform Grooming: with supervision;standing Pt Will Perform Upper Body Bathing: with set-up;sitting Pt Will Perform Lower Body Bathing: with supervision;sit to/from stand Pt Will Perform Upper Body Dressing: with set-up;sitting Pt Will Perform Lower Body Dressing: with supervision;sit to/from  stand Pt Will Transfer to Toilet: with supervision;ambulating;regular height toilet;grab bars Pt Will Perform Toileting - Clothing Manipulation and hygiene: with supervision;sit to/from stand Additional ADL Goal #1: Pt will independently incorporate energy conservation activities into ADL taks with min cues   Plan Discharge plan remains appropriate    Co-evaluation                 End of Session Equipment Utilized During Treatment: Rolling walker;Oxygen   Activity Tolerance Patient limited by fatigue   Patient Left in bed;with call bell/phone within reach   Nurse Communication Mobility status        Time: [id number] OT Time Calculation (min): 20 min  Charges: OT General Charges $OT Visit: 1 Procedure OT Treatments $Therapeutic Activity: 8-22 mins  Lorina Duffner M 05/13/2016, 2:47 PM

## 2016-05-13 NOTE — Progress Notes (Signed)
Speech Language Pathology Treatment: Dysphagia  Patient Details Name: Kenneth Merritt MRN: [id number] DOB: [birth year] Today's Date: 05/13/2016 Time: [id number] SLP Time Calculation (min) (ACUTE ONLY): 9 min  Assessment / Plan / Recommendation Clinical Impression  Reinforced throat clear strategy with pt and daughter, explained rationale and preference for cup sips over straws. Pt return demonstrated throat clear with mod cues fading to independence. He did not want more than 5 sips. Will benefit from further f/u during a meal to test for carry over.    HPI HPI: Kenneth Merritt is a 65 y.o. male with a h/o COPD on home O2, CAD, diastolic dysfunction, and afib now admitted with COPD exacerbation.He had recent hospitalization 04/21/16-05/01/16 for A. fib with RVR, acute metabolic encephalopathy, elevated troponins, COPD exacerbation, and questionable aspiration.MBS 04/24/16 indicated a mild dysphagia with trace penetration of thin liquids, silent, but clearing with cued throat clear. Dysphagia primarily oral with oral holding and prolonged mastication of solids due to missing dentition. Current CXR without acute findings.       SLP Plan  Continue with current plan of care     Recommendations  Diet recommendations: Dysphagia 2 (fine chop);Thin liquid Liquids provided via: Cup Medication Administration: Whole meds with puree Supervision: Patient able to self feed;Full supervision/cueing for compensatory strategies Compensations: Slow rate;Small sips/bites;Clear throat intermittently Postural Changes and/or Swallow Maneuvers: Seated upright 90 degrees             Plan: Continue with current plan of care     GO               [facility], MA CCC-SLP [id number]  Lynann Beaver 05/13/2016, 11:32 AM

## 2016-05-14 DIAGNOSIS — J9611 Chronic respiratory failure with hypoxia: Secondary | ICD-10-CM

## 2016-05-14 DIAGNOSIS — K219 Gastro-esophageal reflux disease without esophagitis: Secondary | ICD-10-CM

## 2016-05-14 DIAGNOSIS — Z95828 Presence of other vascular implants and grafts: Secondary | ICD-10-CM

## 2016-05-14 DIAGNOSIS — R6510 Systemic inflammatory response syndrome (SIRS) of non-infectious origin without acute organ dysfunction: Secondary | ICD-10-CM

## 2016-05-14 DIAGNOSIS — I480 Paroxysmal atrial fibrillation: Secondary | ICD-10-CM

## 2016-05-14 DIAGNOSIS — J441 Chronic obstructive pulmonary disease with (acute) exacerbation: Secondary | ICD-10-CM

## 2016-05-14 DIAGNOSIS — T17998D Other foreign object in respiratory tract, part unspecified causing other injury, subsequent encounter: Secondary | ICD-10-CM

## 2016-05-14 LAB — CBC
HCT: 29.5 % — ABNORMAL LOW (ref 39.0–52.0)
Hemoglobin: 8.9 g/dL — ABNORMAL LOW (ref 13.0–17.0)
MCH: 27.6 pg (ref 26.0–34.0)
MCHC: 30.2 g/dL (ref 30.0–36.0)
MCV: 91.3 fL (ref 78.0–100.0)
PLATELETS: 267 10*3/uL (ref 150–400)
RBC: 3.23 MIL/uL — ABNORMAL LOW (ref 4.22–5.81)
RDW: 16.3 % — AB (ref 11.5–15.5)
WBC: 10.9 10*3/uL — ABNORMAL HIGH (ref 4.0–10.5)

## 2016-05-14 LAB — BASIC METABOLIC PANEL
Anion gap: 7 (ref 5–15)
BUN: 9 mg/dL (ref 6–20)
CHLORIDE: 99 mmol/L — AB (ref 101–111)
CO2: 33 mmol/L — AB (ref 22–32)
CREATININE: 0.41 mg/dL — AB (ref 0.61–1.24)
Calcium: 8.4 mg/dL — ABNORMAL LOW (ref 8.9–10.3)
GFR calc Af Amer: >60 mL/min (ref >60)
GFR calc non Af Amer: >60 mL/min (ref >60)
Glucose, Bld: 199 mg/dL — ABNORMAL HIGH (ref 65–99)
Potassium: 3.3 mmol/L — ABNORMAL LOW (ref 3.5–5.1)
SODIUM: 139 mmol/L (ref 135–145)

## 2016-05-14 LAB — CULTURE, RESPIRATORY W GRAM STAIN

## 2016-05-14 LAB — CULTURE, RESPIRATORY

## 2016-05-14 LAB — GLUCOSE, CAPILLARY
Glucose-Capillary: 189 mg/dL — ABNORMAL HIGH (ref 65–99)
Glucose-Capillary: 165 mg/dL — ABNORMAL HIGH (ref 65–99)

## 2016-05-14 MED ORDER — FLUCONAZOLE 100 MG PO TABS
200.0000 mg | ORAL_TABLET | Freq: Once | ORAL | Status: AC
  Administered 2016-05-14: 200 mg via ORAL
  Filled 2016-05-14: qty 2

## 2016-05-14 MED ORDER — POTASSIUM CHLORIDE CRYS ER 20 MEQ PO TBCR
40.0000 meq | EXTENDED_RELEASE_TABLET | Freq: Once | ORAL | Status: AC
  Administered 2016-05-14: 40 meq via ORAL
  Filled 2016-05-14: qty 2

## 2016-05-14 MED ORDER — GUAIFENESIN ER 600 MG PO TB12: 600.0000 mg | ORAL_TABLET | Freq: Two times a day (BID) | ORAL | Status: DC

## 2016-05-14 MED ORDER — FUROSEMIDE 20 MG PO TABS: 20.0000 mg | ORAL_TABLET | Freq: Every day | ORAL | Status: DC | PRN

## 2016-05-14 MED ORDER — FAMOTIDINE 20 MG PO TABS: 20.0000 mg | ORAL_TABLET | Freq: Two times a day (BID) | ORAL | Status: DC

## 2016-05-14 MED ORDER — DOXYCYCLINE HYCLATE 100 MG PO TABS: 100.0000 mg | ORAL_TABLET | Freq: Two times a day (BID) | ORAL | Status: AC

## 2016-05-14 MED ORDER — LEVALBUTEROL HCL 0.63 MG/3ML IN NEBU
0.6300 mg | INHALATION_SOLUTION | Freq: Four times a day (QID) | RESPIRATORY_TRACT | Status: DC
  Administered 2016-05-14 (×2): 0.63 mg via RESPIRATORY_TRACT
  Filled 2016-05-14: qty 3

## 2016-05-14 MED ORDER — FLUCONAZOLE 100 MG PO TABS: 100.0000 mg | ORAL_TABLET | Freq: Every day | ORAL | Status: DC

## 2016-05-14 MED ORDER — PREDNISONE 10 MG PO TABS: ORAL_TABLET | ORAL | Status: DC

## 2016-05-14 MED ORDER — IPRATROPIUM BROMIDE 0.02 % IN SOLN
0.5000 mg | Freq: Four times a day (QID) | RESPIRATORY_TRACT | Status: DC
  Administered 2016-05-14 (×2): 0.5 mg via RESPIRATORY_TRACT
  Filled 2016-05-14: qty 2.5

## 2016-05-14 NOTE — Care Management Note (Signed)
Case Management Note  Patient Details  Name: Kenneth Merritt MRN: [id number] Date of Birth: 06-19-1951  Subjective/Objective:                    Action/Plan: Plan is for pt to d/c with resumption of home health services, and home oxygen.  Expected Discharge Date:  05/14/2016             Expected Discharge Plan:  [place]  In-House Referral:     Discharge planning Services  CM Consult  Post Acute Care Choice:  Resumption of Svcs/PTA Provider Choice offered to:  Patient, Spouse  DME Arranged:  Diabetic Supplies, Oxygen DME Agency:  [place] Arranged:  PT, OT, RN, Social Work (Resumption of home health services arranged with Allison Quarry [phone]) [place]:  [place]  Status of Service:  Completed, signed off  If discussed at [place] of Stay Meetings, dates discussed:    Additional Comments:  Sharin Mons, RN 05/14/2016, 11:32 AM

## 2016-05-14 NOTE — Consult Note (Signed)
   [facility] CM Inpatient Consult   05/14/2016  [name] 19-Apr-1951 [id number]  Patient is currently active with [facility] for chronic disease management services.  Patient has been engaged by a [employer].  Noted that community had trouble with maintaining contact with the patient.  Patient is a 65 y.o. male with a h/o COPD on home O2, CAD, diastolic dysfunction, and afib now admitted with COPD exacerbation.He had recent hospitalization 04/21/16-05/01/16 for A. fib with RVR, acute metabolic encephalopathy, elevated troponins, COPD exacerbation, and questionable aspiration. Active consent on file.  Will notify community that the patient confirmed contact information and desire for ongoing care management.  Patient will receive a post discharge transition of care call and will be evaluated for monthly home visits for assessments and disease process education.  Made Inpatient Case Manager aware that [place] [facility] following. Of note, [facility] Care Management services does not replace or interfere with any services that are needed or arranged by inpatient case management or social work.  For additional questions or referrals please contact:  Natividad Brood, RN BSN [place] [facility] Liaison  [phone] business mobile phone Toll free office [phone]

## 2016-05-14 NOTE — Progress Notes (Signed)
Occupational Therapy Treatment Patient Details Name: Kenneth Merritt MRN: [id number] DOB: Dec 08, 1950 Today's Date: 05/14/2016    History of present illness Kenneth Merritt is a 65 y.o. male with a h/o COPD on home O2, CAD, diastolic dysfunction, and afib now admitted with COPD exacerbation.He had recent hospitalization 04/21/16-05/01/16 for A. fib with RVR, acute metabolic encephalopathy, elevated troponins, COPD exacerbation, and questionable aspiration.   OT comments  Pt progressing with ADLs - currently requires min guard assist with DOE 3/4.  Reviewed energy conservation techniques and provided him with written information.    Follow Up Recommendations  Home health OT;Supervision/Assistance - 24 hour    Equipment Recommendations  Tub/shower seat    Recommendations for Other Services      Precautions / Restrictions Precautions Precautions: Fall Precaution Comments: O2 dep       Mobility Bed Mobility                  Transfers Overall transfer level: Needs assistance Equipment used: Rolling walker (2 wheeled) Transfers: Sit to/from Stand;Stand Pivot Transfers Sit to Stand: Supervision Stand pivot transfers: Supervision       General transfer comment: Pt instructed safe technique for negotiating through tight spaces with walker.  Able to return demonstration     Balance Overall balance assessment: Needs assistance Sitting-balance support: Feet supported Sitting balance-Leahy Scale: Good     Standing balance support: Bilateral upper extremity supported Standing balance-Leahy Scale: Poor Standing balance comment: reliant on UE support                    ADL Overall ADL's : Needs assistance/impaired     Grooming: Wash/dry hands;Wash/dry face;Oral care;Applying deodorant;Brushing hair;Set up;Supervision/safety;Sitting   Upper Body Bathing: Set up;Supervision/ safety;Sitting   Lower Body Bathing: Min guard;Sit to/from stand   Upper Body Dressing :  Supervision/safety;Set up;Sitting   Lower Body Dressing: Min guard;Sit to/from stand   Toilet Transfer: Min guard;Ambulation;Comfort height toilet;Grab bars;RW   Toileting- Water quality scientist and Hygiene: Min guard;Sit to/from stand       Functional mobility during ADLs: Min guard;Rolling walker General ADL Comments: Pt performed sponge bath sitting at sink  with multiple rest breaks.  DOE 3/4.  Discussed energy conservation techniques with him and provided handouts.  Reinforced recommendation for tub seat in the shower and close supervision if he chooses to shower - discussed options for shower seats.  Currently, pt reports he will sponge bathe       Vision                     Perception     Praxis      Cognition   Behavior During Therapy: Flat affect Overall Cognitive Status: Within Functional Limits for tasks assessed                       Extremity/Trunk Assessment               Exercises     Shoulder Instructions       General Comments      Pertinent Vitals/ Pain       Pain Assessment: No/denies pain  Home Living                                          Prior Functioning/Environment  Frequency Min 2X/week     Progress Toward Goals  OT Goals(current goals can now be found in the care plan section)  Progress towards OT goals: Progressing toward goals  ADL Goals Pt Will Perform Grooming: with supervision;standing Pt Will Perform Upper Body Bathing: with set-up;sitting Pt Will Perform Lower Body Bathing: with supervision;sit to/from stand Pt Will Perform Upper Body Dressing: with set-up;sitting Pt Will Perform Lower Body Dressing: with supervision;sit to/from stand Pt Will Transfer to Toilet: with supervision;ambulating;regular height toilet;grab bars Pt Will Perform Toileting - Clothing Manipulation and hygiene: with supervision;sit to/from stand Additional ADL Goal #1: Pt will independently  incorporate energy conservation activities into ADL taks with min cues   Plan Discharge plan remains appropriate    Co-evaluation                 End of Session Equipment Utilized During Treatment: Rolling walker;Oxygen   Activity Tolerance Patient limited by fatigue   Patient Left in bed;with call bell/phone within reach   Nurse Communication Mobility status        Time: [id number] OT Time Calculation (min): 26 min  Charges: OT General Charges $OT Visit: 1 Procedure OT Treatments $Self Care/Home Management : 23-37 mins  Holly Pring M 05/14/2016, 1:13 PM

## 2016-05-14 NOTE — Progress Notes (Signed)
NURSING PROGRESS NOTE  AZIEL SCHAD [id number] Discharge Data: 05/14/2016 3:23 PM Attending Provider: Lavina Hamman, MD [id number] Jan Fireman, MD   Sampson Goon to be D/C'd Home per MD order.    All IV's will be discontinued and monitored for bleeding.  All belongings will be returned to patient for patient to take home.  Last Documented Vital Signs:  Blood pressure 107/57, pulse 88, temperature 97.9 F (36.6 C), temperature source Oral, resp. rate 20, height 5\' 4"  (1.626 m), weight 58.333 kg (128 lb 9.6 oz), SpO2 98 %.  Joslyn Hy, MSN, RN, [employer]

## 2016-05-14 NOTE — Care Management Important Message (Signed)
Important Message  Patient Details  Name: Kenneth Merritt MRN: [id number] Date of Birth: [birth year]   Medicare Important Message Given:  Yes    [name] 05/14/2016, 9:40 AM

## 2016-05-15 ENCOUNTER — Other Ambulatory Visit: Payer: Self-pay | Admitting: *Deleted

## 2016-05-15 LAB — CULTURE, BLOOD (ROUTINE X 2): CULTURE: NO GROWTH

## 2016-05-15 NOTE — Discharge Summary (Signed)
[facility] Discharge Summary   Patient: Kenneth Merritt [id number]   PCP: Neale Burly, MD DOB: Mar 08, 1951   Date of admission: 05/10/2016   Date of discharge: 05/14/2016   Discharge Diagnoses:  Principal Problem:   COPD with acute exacerbation ([place]) Active Problems:   GERD (gastroesophageal reflux disease)   COPD (chronic obstructive pulmonary disease) (HCC)   S/P aortobifemoral bypass surgery   PAF (paroxysmal atrial fibrillation) (HCC)   Chronic respiratory failure (HCC)   Sepsis (HCC)   Aspiration into airway   SOB (shortness of breath)   Malnutrition of moderate degree  Admitted From: Home Disposition:  Home with home health  Recommendations for Outpatient Follow-up:  1. Please follow-up with PCP in one week. 2. Patient may benefit from pulmonary referral.   Follow-up Information    Follow up with [place].   Why:  Resumption of home health services (RN,PT,OT,SW) arranged   Contact information:   [address] [place] [address] [phone]       Follow up with Neale Burly, MD. Schedule an appointment as soon as possible for a visit on 05/22/2016.   Specialty:  Internal Medicine   Why:  Appointment with Dr. Sherrie Sport is on 05/22/16 at [facility] information:   [address] [place] [id number] [phone]      Diet recommendation: Dysphagia type II diet  Activity: The patient is advised to gradually reintroduce usual activities.  Discharge Condition: good  Code Status: Partial code, DO NOT INTUBATE, no NIV  History of present illness: As per the H and P dictated on admission, "Kenneth Merritt is a 65 y.o. male   H/o long time smoker, reported quit several years ago, h/o aortobifemoral bypass surgery, h/o prostate ca s/p XRT in 2013, h/o PAF on eliquis, diastolic chf on lasix, copd on home oxygen 2liter who was recently discharged from the hospital after being treated for afib and COPd exacerbation is brought back  to the ED by EMS due to increasing sob, productive cough, EMS report his oxygen saturation was in the 80's on 2liter, he refused bipap.   ED course: he is found to have fever of 100.9, sinus tachycardia and tachypneic, per nursing note his respiration rate initially in the 30's, he was put on NRB, o2sat at 100%, cxr no acute findings, ekg sinus tachycardia, no acute st/t changes,labs significant for leukocytosis wbc 14.3, and troponin elevation at 0.59, lactic acid wnl. ABG Ph 7.3 pco2 58.9, p02 350 on NRB, he adamantly refuse bipap, He was given vanc/zosyn/solumedrol and duoneb x1,cardiology paged by EDP and hospitalist called to admit the patient.  When i entered the room, patient is on NRB, 02 sats100%, rr 18, does has audible rhonchi vs upper airway sounds, he reports having diarrhea in the last few days and has not been eating well. He also reports bilateral chest pain. Denies abdominal pain, he has h/o lower extremity edema intermittently, but none today. "  Hospital Course:  Summary of his active problems in the hospital is as following. COPD exacerbation - Treated with oxygen, bronchodilators, IV Solu-Medrol. Received a dose of Zosyn and vancomycin in ED, later on started on doxycycline to finish a 5 day treatment course. - Added flutter valve. - Patient has opted for partial code: He confirms DO NOT INTUBATE and does not want BiPAP either. - Sputum culture: Moderate yeast. Treated with oral fluconazole for 2 weeks. - One of 2 blood cultures positive for  coagulase-negative staph. - Start PO Doxycycline d/t to productive cough and complete 5 days Rx.  Acute on chronic respiratory failure with hypoxia and hypercapnia - Likely from COPD exacerbation. Not overtly volume overloaded. Management as above. Clinically improving. Titrate for oxygen saturation between 88-92 percent. Patient back on home oxygen 3 L/m.  One of 2 blood cultures positive for coagulase negative staph, likely  contaminant  Chronic diastolic CHF - Clinically compensated.  SIRS - Present on admission with temperature 100.9, leukocytosis, tachycardia, hypoxia and tachypnea. - UA not suggestive of UTI. Blood cultures no growth to date. Pro-calcitonin minimally elevated. Lactate normal. Chest x-ray without active disease. - SIRS resolved  Diarrhea, R/O C. Difficile - As per patient, diarrhea was decreasing even prior to hospital admission. No BM since 05/09/16. - Unable to send stool for C. difficile because he has not had a BM. DCed oral vancomycin and contact precautions.  Elevated troponin/CAD - Secondary to acute illness and possible demand ischemia. No chest pain reported. EKG without acute changes. Continue statins. Cardiology input appreciated:  Elevated troponins likely due to demand ischemia from acute respiratory failure.  Poor candidate for beta blocker given lung disease. Avoid aspirin due to being on Eliquis.  No further CV testing recommended unless marked increase in troponin or development of anginal symptoms. Cardiology signed off 7/2.  Paroxysmal atrial fibrillation - Remains in sinus rhythm. Continue calcium channel blockers and Apixaban  Type II DM - A1c 6.6 on 04/2016. Continue home regimen.  Dysphagia - Resume dysphagia 2 diet and thin liquids as per recent swallow evaluation. Speech therapy recommends continued same diet.  Anemia - No bleeding reported. Baseline hemoglobin said to be in 10-11.  Follow CBC outpatient as well. Outpatient follow-up with [facility]. Stable.  Severe protein calorie malnutrition Continue nutrition supplementation.  Hypokalemia - Replaced.  All other chronic medical condition were stable during the hospitalization.  Patient was seen by physical therapy, who recommended home health, which was arranged by Education officer, museum and case Freight forwarder. On the day of the discharge the patient's oxygenation and vitals were stable, and no other acute medical  condition were reported by patient. the patient was felt safe to be discharge at home with home health.  Procedures and Results:  none   Consultations:  Cardiology  DISCHARGE MEDICATION: Discharge Medication List as of 05/14/2016  2:37 PM    START taking these medications   Details  doxycycline (VIBRA-TABS) 100 MG tablet Take 1 tablet (100 mg total) by mouth every 12 (twelve) hours., Starting 05/14/2016, Until Sat 05/17/16, Normal    famotidine (PEPCID) 20 MG tablet Take 1 tablet (20 mg total) by mouth 2 (two) times daily., Starting 05/14/2016, Until Discontinued, Normal    fluconazole (DIFLUCAN) 100 MG tablet Take 1 tablet (100 mg total) by mouth daily., Starting 05/15/2016, Until Discontinued, Normal    guaiFENesin (MUCINEX) 600 MG 12 hr tablet Take 1 tablet (600 mg total) by mouth 2 (two) times daily., Starting 05/14/2016, Until Discontinued, Normal    predniSONE (DELTASONE) 10 MG tablet Take 50mg  daily for 3days,Take 40mg  daily for 3days,Take 30mg  daily for 3days,Take 20mg  daily for 3days,Take 10mg  daily for 3days, then stop, Normal      CONTINUE these medications which have CHANGED   Details  furosemide (LASIX) 20 MG tablet Take 1 tablet (20 mg total) by mouth daily as needed for fluid or edema. as needed for swelling/weight gain of >2lbs overnight or 4-5lbs in 1 week, Starting 05/14/2016, Until Discontinued, Print  CONTINUE these medications which have NOT CHANGED   Details  albuterol (PROVENTIL HFA;VENTOLIN HFA) 108 (90 BASE) MCG/ACT inhaler Inhale 2 puffs into the lungs every 4 (four) hours as needed for wheezing or shortness of breath., Starting 09/19/2015, Until Discontinued, No Print    albuterol (PROVENTIL) (2.5 MG/3ML) 0.083% nebulizer solution Take 3 mLs (2.5 mg total) by nebulization every 4 (four) hours as needed for wheezing or shortness of breath., Starting 03/15/2015, Until Discontinued, No Print    ALPRAZolam (XANAX) 0.25 MG tablet Take 0.25 mg by mouth 3 (three) times  daily as needed for anxiety or sleep. , Starting 12/17/2015, Until Discontinued, Historical Med    atorvastatin (LIPITOR) 20 MG tablet Take 1 tablet (20 mg total) by mouth daily at 6 PM., Starting 05/01/2016, Until Discontinued, Normal    diltiazem (CARDIZEM CD) 240 MG 24 hr capsule Take 1 capsule (240 mg total) by mouth daily., Starting 05/01/2016, Until Discontinued, Normal    ELIQUIS 5 MG TABS tablet TAKE 1 TABLET BY MOUTH TWICE A DAY, Normal    gabapentin (NEURONTIN) 300 MG capsule Take 300 mg by mouth 3 (three) times daily., Until Discontinued, Historical Med    HYDROcodone-acetaminophen (NORCO/VICODIN) 5-325 MG tablet Take 1-2 tablets by mouth every 6 (six) hours as needed for moderate pain or severe pain., Starting 04/15/2016, Until Discontinued, No Print    ipratropium (ATROVENT) 0.02 % nebulizer solution Take 0.5 mg by nebulization every 4 (four) hours as needed for wheezing or shortness of breath., Until Discontinued, Historical Med    metFORMIN (GLUCOPHAGE) 1000 MG tablet Take 1,000 mg by mouth daily with breakfast., Until Discontinued, Historical Med    omeprazole (PRILOSEC) 20 MG capsule Take 20 mg by mouth every morning. , Starting 04/21/2011, Until Discontinued, Historical Med    polyethylene glycol (MIRALAX / GLYCOLAX) packet Take 17 g by mouth daily., Starting 05/01/2016, Until Discontinued, Normal    feeding supplement, ENSURE ENLIVE, (ENSURE ENLIVE) LIQD Take 237 mLs by mouth 2 (two) times daily between meals., Starting 05/01/2016, Until Discontinued, Normal    food thickener (THICK IT) POWD As needed, No Print    Multiple Vitamin (MULTIVITAMIN WITH MINERALS) TABS tablet Take 1 tablet by mouth daily., Starting 05/01/2016, Until Discontinued, No Print    nitroGLYCERIN (NITROSTAT) 0.4 MG SL tablet Place 1 tablet (0.4 mg total) under the tongue every 5 (five) minutes x 3 doses as needed for chest pain., Starting 06/11/2015, Until Discontinued, Normal       Allergies  Allergen  Reactions  . Levaquin [Levofloxacin In D5w] Other (See Comments)    Pt states it caused dry mouth and rectal bleeding.    Discharge Instructions    DIET DYS 2    Complete by:  As directed   Diet recommendations: Dysphagia 2 (fine chop);Thin liquid Liquids provided via: Cup Medication Administration: Whole meds with puree Supervision: Patient able to self feed;Full supervision/cueing for compensatory strategies Compensations: Slow rate;Small sips/bites;Clear throat intermittently Postural Changes and/or Swallow Maneuvers: Seated upright 90 degrees  Fluid consistency:  Thin     Diet - low sodium heart healthy    Complete by:  As directed      Diet Carb Modified    Complete by:  As directed      Discharge instructions    Complete by:  As directed   It is important that you read following instructions as well as go over your medication list with RN to help you understand your care after this hospitalization.  Discharge Instructions: Please follow-up  with PCP in one week  Please request your primary care physician to go over all Hospital Tests and Procedure/Radiological results at the follow up,  Please get all Hospital records sent to your PCP by signing hospital release before you go home.   Do not take more than prescribed Pain, Sleep and Anxiety Medications. You were cared for by a hospitalist during your hospital stay. If you have any questions about your discharge medications or the care you received while you were in the hospital after you are discharged, you can call the unit and ask to speak with the hospitalist on call if the hospitalist that took care of you is not available.  Once you are discharged, your primary care physician will handle any further medical issues. Please note that NO REFILLS for any discharge medications will be authorized once you are discharged, as it is imperative that you return to your primary care physician (or establish a relationship with a primary  care physician if you do not have one) for your aftercare needs so that they can reassess your need for medications and monitor your lab values. You Must read complete instructions/literature along with all the possible adverse reactions/side effects for all the Medicines you take and that have been prescribed to you. Take any new Medicines after you have completely understood and accept all the possible adverse reactions/side effects. Wear Seat belts while driving. If you have smoked or chewed Tobacco in the last 2 yrs please stop smoking and/or stop any Recreational drug use.     Increase activity slowly    Complete by:  As directed           Discharge Exam: Filed Weights   05/11/16 0300 05/12/16 0331 05/12/16 1701  Weight: 57.063 kg (125 lb 12.8 oz) 57.335 kg (126 lb 6.4 oz) 58.333 kg (128 lb 9.6 oz)   Filed Vitals:   05/14/16 0512 05/14/16 0635  BP: 107/57 107/57  Pulse: 88 88  Temp: 97.9 F (36.6 C) 97.9 F (36.6 C)  Resp: 19 20   General: Appear in mild distress, no Rash; Oral Mucosa moist. Cardiovascular: S1 and S2 Present, aortic systolic Murmur, no JVD Respiratory: Bilateral Air entry present and faint basal Crackles, no wheezes Abdomen: Bowel Sound present, Soft and no tenderness Extremities: no Pedal edema, no calf tenderness Neurology: Grossly no focal neuro deficit.  The results of significant diagnostics from this hospitalization (including imaging, microbiology, ancillary and laboratory) are listed below for reference.    Significant Diagnostic Studies: Ct Chest Wo Contrast  04/22/2016  CLINICAL DATA:  Known chronic atrial fibrillation, peripheral vascular disease, status post aortofemoral bypass. Hospitalized 5/28 through 6/6 for sepsis. Dyspnea. EXAM: CT CHEST WITHOUT CONTRAST TECHNIQUE: Multidetector CT imaging of the chest was performed following the standard protocol without IV contrast. COMPARISON:  Chest CT dated 03/27/2016 FINDINGS: Mediastinum/Lymph Nodes:  Scattered atherosclerotic changes of the normal-caliber thoracic aorta. Heart size is normal. No pericardial effusion. Coronary artery calcifications noted. Scattered small lymph nodes within the mediastinum, none of which are pathologic by CT size criteria. Esophagus appears normal. Trachea and central bronchi are unremarkable. Lungs/Pleura: Emphysematous changes bilaterally, upper lobe predominant. New irregular nodule within the superior segment of the right upper lobe, measuring 1.7 x 0.8 cm (series 3, image 63). Additional new nodular consolidation more inferiorly within the right lower lobe, measures approximately 1.8 x 0.7 cm, possibly atelectasis or confluent scarring (series 3, image 104). Confluent scarring/ fibrosis noted at each lung apex. Upper abdomen: Small stone  within the gallbladder. No evidence of acute cholecystitis. Limited images of the upper abdomen are otherwise unremarkable. Musculoskeletal: Chronic-appearing compression fracture deformities noted within the mid thoracic spine, slightly progressed at the T6 vertebral body level since recent chest CT of 03/27/2016, not significantly changed at the T7 and T8 vertebral body levels. No acute-appearing osseous abnormality. IMPRESSION: 1. New irregular nodular density within the superior segment of the right upper lobe, measuring 1.7 x 0.8 x 0.8 cm, suspicious for neoplastic nodule. Consider PET-CT for further characterization. Recommend surgical consultation for further workup considerations. 2. Additional new ill-defined nodular consolidation more inferiorly within the right lower lobe, measuring approximately 1.8 x 0.7 x 2.1 cm. This may also represent neoplastic process but is less suspicious, perhaps atelectasis or confluent scarring. PET-CT may be helpful for this finding as well. 3. Emphysema, upper lobe predominant. 4. Coronary artery calcifications, particularly dense within the left anterior descending coronary artery. Recommend  correlation with any possible associated cardiac symptoms. 5. Cholelithiasis without evidence of acute cholecystitis. 6. Chronic-appearing compression fracture deformities of the T6 through T8 vertebral bodies. T6 compression has slightly progressed compared to the previous exam of 03/27/2016. T7 and T8 compression deformities appear stable. These results will be called to the ordering clinician or representative by the Radiologist Assistant, and communication documented in the PACS or zVision Dashboard. Electronically Signed   By: Franki Cabot M.D.   On: 04/22/2016 18:21   Dg Chest Portable 1 View  05/10/2016  CLINICAL DATA:  Four weeks of wheezing chest pain and shortness of breath. EXAM: PORTABLE CHEST 1 VIEW COMPARISON:  04/25/2016 FINDINGS: Lordotic positioning. The lungs are clear wiithout focal pneumonia, edema, pneumothorax or pleural effusion. Biapical pleural-parenchymal scarring again noted. Interstitial markings are diffusely coarsened with chronic features. The cardiopericardial silhouette is within normal limits for size. Telemetry leads overlie the chest. IMPRESSION: No active disease. Electronically Signed   By: Misty Stanley M.D.   On: 05/10/2016 16:07   Dg Chest Port 1 View  04/25/2016  CLINICAL DATA:  Aspiration in upper airway EXAM: PORTABLE CHEST 1 VIEW COMPARISON:  04/24/2016 FINDINGS: Cardiomediastinal silhouette is stable. Emphysematous changes again noted. Stable bilateral upper lobe medial scarring and some architectural distortion. Elongated nodular density superior segment of right lower lobe again noted. No infiltrate or pulmonary edema. Hyperinflation IMPRESSION: Emphysematous changes again noted. Stable bilateral upper lobe medial scarring and some architectural distortion. Elongated nodular density superior segment of right lower lobe again noted. No infiltrate or pulmonary edema. Hyperinflation Electronically Signed   By: Lahoma Crocker M.D.   On: 04/25/2016 09:53   Dg Chest  Portable 1 View  04/24/2016  CLINICAL DATA:  Respiratory failure.  Shortness of breath. EXAM: PORTABLE CHEST 1 VIEW COMPARISON:  04/23/2016.  CT 04/22/2016. FINDINGS: Mediastinum hilar structures normal. Heart size normal. Previously identified nodular density in the right mid lung is again best identified by CT. No pleural effusion or pneumothorax. No acute bony abnormality. IMPRESSION: 1. No acute cardiopulmonary disease. 2. Previously identified small right mid lung nodular density is again best identified by prior CT of 04/22/2016. Reference is made to that report. Electronically Signed   By: Marcello Moores  Register   On: 04/24/2016 07:37   Dg Chest Port 1 View  04/23/2016  CLINICAL DATA:  Shortness of breath with cough and congestion EXAM: PORTABLE CHEST 1 VIEW COMPARISON:  Chest radiograph April 21, 2016 and chest CT April 22, 2016 FINDINGS: There is scarring in each upper lobe and right mid lung region, stable. Lungs  overall appear mildly hyperexpanded. There is no frank edema or consolidation. There is a the focal opacity in the periphery of the right mid lung measuring 1.2 x 1.0 cm which is felt to correspond to the lesions seen on CT 1 day prior. This lesion is better seen by CT. Heart size and pulmonary vascularity are within normal limits. No adenopathy evident. IMPRESSION: Nodular opacity in the periphery the right mid lung, better seen on CT 1 day prior. Areas of scarring, primarily in the upper lobes near the apices. Lungs hyperexpanded. No edema or consolidation. Stable cardiac silhouette. Electronically Signed   By: Lowella Grip III M.D.   On: 04/23/2016 07:30   Dg Chest Portable 1 View  04/21/2016  CLINICAL DATA:  Dyspnea EXAM: PORTABLE CHEST 1 VIEW COMPARISON:  04/13/2016 FINDINGS: Cardiac shadow is stable. Mild interstitial changes are noted somewhat accentuated by the portable technique. No focal confluent infiltrate or sizable effusion is noted. No bony abnormality is seen. IMPRESSION: No  change from the previous exam. Electronically Signed   By: Inez Catalina M.D.   On: 04/21/2016 16:46   Dg Swallowing Func-speech Pathology  04/24/2016  Objective Swallowing Evaluation: Type of Study: MBS-Modified Barium Swallow Study Patient Details Name: ELIJIAH ALTOMARI MRN: [id number] Date of Birth: Mar 21, 1951 Today's Date: 04/24/2016 Time: SLP Start Time (ACUTE ONLY): 1149-SLP Stop Time (ACUTE ONLY): 1209 SLP Time Calculation (min) (ACUTE ONLY): 20 min Past Medical History: Past Medical History Diagnosis Date . GERD (gastroesophageal reflux disease)  . COPD (chronic obstructive pulmonary disease) ([place])    severe by PFTs August 2010 . 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 Atrial fibrillation (HCC)    post-op a-fib. 3/11. post Aortobifem , /  Atrial fibrillation from nebulizer treatment  May, 2012, while hospitalized . Renal artery stenosis ([place])     presumably corrected at time of aortobifem surgery March, 2011 . Tobacco user     stop smoking . Pneumothorax     2011  before aortobifemoral surgery,  resolved . Peripheral vascular disease, unspecified ([place])    bilateral intermittent claudication . CAD (coronary artery disease), native coronary artery    catheterization 06/2009.Marland KitchenMarland Kitchen70% proximal LAD and 30% proximal RCA. normal LV function, no intervention planned prior to surgery for aorta. LV normal function  by catheter 8/10.  [name] Dyslipidemia  . DJD (degenerative joint disease)  . Ejection fraction    60%, echo, May, 2012, rapid atrial fib at the time . Cancer [facility]) 2013   Prostate Radiation . Myocardial infarction ([place]) 2011 . On home O2    2L N/C . Chronic thoracic back pain  . Chronic pain syndrome  Past Surgical History: Past Surgical History Procedure Laterality Date . Abdominal aortic aneurysm repair  01/11/2010 . Pr vein bypass graft,aorto-fem-pop   . Inguinal hernia repair     RIGHT HPI: Patient with a long history of  respiratory issues and reports new onset of difficulty swallowing Subjective: pt says he has trouble swallowing his pills Assessment / Plan / Recommendation CHL IP CLINICAL IMPRESSIONS 04/24/2016 Therapy Diagnosis Mild oral phase dysphagia;Mild pharyngeal phase dysphagia Clinical Impression Pt has a mild oropharyngeal dysphagia with no aspiration observed during this study. He has brief oral holding with thin liquids and prolonged mastication of solids, likely due to lack of dentition. He does have difficulty posteriorly transiting a halved barium tablet (broken at his request), which leads to  premature spillage of thin liquid that is subsequently penetrated. This small amount of penetration occurs silently, but is cleared with a cued throat clear. Recommend Dys 2 diet and thin liquids, with no mixed consistencies.  Impact on safety and function Mild aspiration risk   CHL IP TREATMENT RECOMMENDATION 04/24/2016 Treatment Recommendations Therapy as outlined in treatment plan below   Prognosis 04/24/2016 Prognosis for Safe Diet Advancement Fair Barriers to Reach Goals -- Barriers/Prognosis Comment -- CHL IP DIET RECOMMENDATION 04/24/2016 SLP Diet Recommendations Dysphagia 2 (Fine chop) solids;Thin liquid Liquid Administration via Cup;No straw Medication Administration Whole meds with puree Compensations Slow rate;Small sips/bites;Clear throat intermittently Postural Changes Remain semi-upright after after feeds/meals (Comment);Seated upright at 90 degrees   CHL IP OTHER RECOMMENDATIONS 04/24/2016 Recommended Consults -- Oral Care Recommendations Oral care BID Other Recommendations --   CHL IP FOLLOW UP RECOMMENDATIONS 04/24/2016 Follow up Recommendations (No Data)   CHL IP FREQUENCY AND DURATION 04/24/2016 Speech Therapy Frequency (ACUTE ONLY) min 2x/week Treatment Duration 2 weeks      CHL IP ORAL PHASE 04/24/2016 Oral Phase Impaired Oral - Pudding Teaspoon -- Oral - Pudding Cup -- Oral - Honey Teaspoon -- Oral - Honey Cup --  Oral - Nectar Teaspoon -- Oral - Nectar Cup -- Oral - Nectar Straw -- Oral - Thin Teaspoon -- Oral - Thin Cup Holding of bolus Oral - Thin Straw Holding of bolus;Premature spillage Oral - Puree WFL Oral - Mech Soft Impaired mastication Oral - Regular -- Oral - Multi-Consistency -- Oral - Pill Delayed oral transit Oral Phase - Comment --  CHL IP PHARYNGEAL PHASE 04/24/2016 Pharyngeal Phase Impaired Pharyngeal- Pudding Teaspoon -- Pharyngeal -- Pharyngeal- Pudding Cup -- Pharyngeal -- Pharyngeal- Honey Teaspoon -- Pharyngeal -- Pharyngeal- Honey Cup -- Pharyngeal -- Pharyngeal- Nectar Teaspoon -- Pharyngeal -- Pharyngeal- Nectar Cup -- Pharyngeal -- Pharyngeal- Nectar Straw -- Pharyngeal -- Pharyngeal- Thin Teaspoon -- Pharyngeal -- Pharyngeal- Thin Cup WFL Pharyngeal -- Pharyngeal- Thin Straw Penetration/Aspiration before swallow Pharyngeal Material enters airway, remains ABOVE vocal cords and not ejected out Pharyngeal- Puree WFL Pharyngeal -- Pharyngeal- Mechanical Soft WFL Pharyngeal -- Pharyngeal- Regular -- Pharyngeal -- Pharyngeal- Multi-consistency -- Pharyngeal -- Pharyngeal- Pill WFL Pharyngeal -- Pharyngeal Comment --  CHL IP CERVICAL ESOPHAGEAL PHASE 04/24/2016 Cervical Esophageal Phase WFL Pudding Teaspoon -- Pudding Cup -- Honey Teaspoon -- Honey Cup -- Nectar Teaspoon -- Nectar Cup -- Nectar Straw -- Thin Teaspoon -- Thin Cup -- Thin Straw -- Puree -- Mechanical Soft -- Regular -- Multi-consistency -- Pill -- Cervical Esophageal Comment -- No flowsheet data found. Germain Osgood, M.A. CCC-SLP [phone] Germain Osgood 04/24/2016, 2:07 PM               Microbiology: Recent Results (from the past 240 hour(s))  Blood Culture (routine x 2)     Status: Abnormal   Collection Time: 05/10/16  3:40 PM  Result Value Ref Range Status   Specimen Description BLOOD RIGHT ANTECUBITAL  Final   Special Requests BOTTLES DRAWN AEROBIC AND ANAEROBIC 5CC  Final   Culture  Setup Time   Final    GRAM POSITIVE  COCCI IN CLUSTERS IN BOTH AEROBIC AND ANAEROBIC BOTTLES CRITICAL RESULT CALLED TO, READ BACK BY AND VERIFIED WITH: M MACCIA,PHARMD AT 1726 05/11/16 BY L BENFIELD    Culture (A)  Final    STAPHYLOCOCCUS SPECIES (COAGULASE NEGATIVE) THE SIGNIFICANCE OF ISOLATING THIS ORGANISM FROM A SINGLE SET OF BLOOD CULTURES WHEN MULTIPLE SETS ARE DRAWN IS UNCERTAIN. PLEASE NOTIFY [place]  WITHIN ONE WEEK IF SPECIATION AND SENSITIVITIES ARE REQUIRED.    Report Status 05/13/2016 FINAL  Final  Blood Culture ID Panel (Reflexed)     Status: Abnormal   Collection Time: 05/10/16  3:40 PM  Result Value Ref Range Status   Enterococcus species NOT DETECTED NOT DETECTED Final   Vancomycin resistance NOT DETECTED NOT DETECTED Final   Listeria monocytogenes NOT DETECTED NOT DETECTED Final   Staphylococcus species DETECTED (A) NOT DETECTED Final    Comment: CRITICAL RESULT CALLED TO, READ BACK BY AND VERIFIED WITH: M MACCIA,PHARMD AT 1726 05/11/16 BY L BENFIELD    Staphylococcus aureus NOT DETECTED NOT DETECTED Final   Methicillin resistance DETECTED (A) NOT DETECTED Final    Comment: CRITICAL RESULT CALLED TO, READ BACK BY AND VERIFIED WITH: M MACCIA,PHARMD AT 1726 05/11/16 BY L BENFIELD    Streptococcus species NOT DETECTED NOT DETECTED Final   Streptococcus agalactiae NOT DETECTED NOT DETECTED Final   Streptococcus pneumoniae NOT DETECTED NOT DETECTED Final   Streptococcus pyogenes NOT DETECTED NOT DETECTED Final   Acinetobacter baumannii NOT DETECTED NOT DETECTED Final   Enterobacteriaceae species NOT DETECTED NOT DETECTED Final   Enterobacter cloacae complex NOT DETECTED NOT DETECTED Final   Escherichia coli NOT DETECTED NOT DETECTED Final   Klebsiella oxytoca NOT DETECTED NOT DETECTED Final   Klebsiella pneumoniae NOT DETECTED NOT DETECTED Final   Proteus species NOT DETECTED NOT DETECTED Final   Serratia marcescens NOT DETECTED NOT DETECTED Final   Carbapenem resistance NOT DETECTED NOT  DETECTED Final   Haemophilus influenzae NOT DETECTED NOT DETECTED Final   Neisseria meningitidis NOT DETECTED NOT DETECTED Final   Pseudomonas aeruginosa NOT DETECTED NOT DETECTED Final   Candida albicans NOT DETECTED NOT DETECTED Final   Candida glabrata NOT DETECTED NOT DETECTED Final   Candida krusei NOT DETECTED NOT DETECTED Final   Candida parapsilosis NOT DETECTED NOT DETECTED Final   Candida tropicalis NOT DETECTED NOT DETECTED Final  Urine culture     Status: Abnormal   Collection Time: 05/10/16  3:45 PM  Result Value Ref Range Status   Specimen Description URINE, RANDOM  Final   Special Requests NONE  Final   Culture <10,000 COLONIES/mL INSIGNIFICANT GROWTH (A)  Final   Report Status 05/11/2016 FINAL  Final  Blood Culture (routine x 2)     Status: None (Preliminary result)   Collection Time: 05/10/16  8:55 PM  Result Value Ref Range Status   Specimen Description BLOOD LEFT HAND  Final   Special Requests IN PEDIATRIC BOTTLE 1CC  Final   Culture NO GROWTH 4 DAYS  Final   Report Status PENDING  Incomplete  Culture, expectorated sputum-assessment     Status: None   Collection Time: 05/11/16  1:49 PM  Result Value Ref Range Status   Specimen Description SPUTUM  Final   Special Requests NONE  Final   Sputum evaluation   Final    THIS SPECIMEN IS ACCEPTABLE. RESPIRATORY CULTURE REPORT TO FOLLOW.   Report Status 05/11/2016 FINAL  Final  Culture, respiratory (NON-Expectorated)     Status: None   Collection Time: 05/11/16  1:49 PM  Result Value Ref Range Status   Specimen Description SPUTUM  Final   Special Requests NONE  Final   Gram Stain   Final    MODERATE WBC PRESENT,BOTH PMN AND MONONUCLEAR FEW SQUAMOUS EPITHELIAL CELLS PRESENT ABUNDANT GRAM POSITIVE RODS RARE GRAM NEGATIVE RODS RARE BUDDING YEAST SEEN    Culture MODERATE CANDIDA TROPICALIS  Final  Report Status 05/14/2016 FINAL  Final     Labs: CBC:  Recent Labs Lab 05/10/16 1540 05/11/16 0601  05/12/16 0450 05/14/16 0705  WBC 14.3* 12.0* 14.2* 10.9*  NEUTROABS 11.9*  --   --   --   HGB 11.0* 9.5* 9.0* 8.9*  HCT 34.9* 30.3* 29.0* 29.5*  MCV 91.4 89.4 88.7 91.3  PLT 226 212 251 [id number]   Basic Metabolic Panel:  Recent Labs Lab 05/10/16 1540 05/11/16 0601 05/14/16 0705  NA 135 134* 139  K 3.4* 3.8 3.3*  CL 96* 95* 99*  CO2 27 29 33*  GLUCOSE 94 147* 199*  BUN <5* <5* 9  CREATININE 0.68 0.56* 0.41*  CALCIUM 7.9* 7.6* 8.4*  MG  --  2.2  --    Liver Function Tests:  Recent Labs Lab 05/10/16 1540 05/11/16 0601  AST 28 22  ALT 46 41  ALKPHOS 83 75  BILITOT 1.9* 1.4*  PROT 5.7* 5.0*  ALBUMIN 2.4* 1.9*   No results for input(s): LIPASE, AMYLASE in the last 168 hours. No results for input(s): AMMONIA in the last 168 hours. Cardiac Enzymes:  Recent Labs Lab 05/10/16 1540 05/10/16 1730 05/10/16 2312 05/11/16 0601 05/11/16 1328  TROPONINI 0.59* 0.49* 0.38* 0.22* 0.75*   BNP (last 3 results)  Recent Labs  04/06/16 1906 04/23/16 1459 05/10/16 1540  BNP 140.0* 117.8* 68.7   CBG:  Recent Labs Lab 05/13/16 1148 05/13/16 1654 05/13/16 2110 05/14/16 0806 05/14/16 1232  GLUCAP 239* 235* 197* 189* 165*   Time spent: 30 minutes  Signed:  Jamilynn Whitacre  [facility] 05/14/2016 , 7:53 AM

## 2016-05-15 NOTE — Patient Outreach (Signed)
Transition of care call Patient admitted 7/1-7/5 for COPD exacerbation Call to patient phone and wife Debbie's phone, no answer, Left voicemail with RNCM contact information. Of note, Unable to reach letter sent last week as multiple outreach attempts have been made. Plan to attempt again.  Royetta Crochet. Laymond Purser, RN, BSN, [facility] Liaison [phone]

## 2016-05-19 ENCOUNTER — Encounter: Payer: Self-pay | Admitting: *Deleted

## 2016-05-19 ENCOUNTER — Other Ambulatory Visit: Payer: Self-pay | Admitting: *Deleted

## 2016-05-19 NOTE — Patient Outreach (Signed)
Call to patient wife, Jackelyn Poling. Spoke with Jackelyn Poling at [employer] and discussed [facility]/gallery curator to her (nursing, Engineer, maintenance, SW and pharmacy services)  Reviewed that program liaison had signed up patient for services while he was in the hospital.  She states that they are getting Home care services and patient does not even want those, but is "allowing them" at this time, due to her insistence that this will help him get better, but "gets moody" about the  People coming into the home. She really does not want to add to his "moodiness" by adding another person coming out. She states he also has a lot of upcoming medical appointments and are not at home too much right now.  RNCM discussed that a letter and brochure regarding [facility] program services had been mailed out. She states she does not "bother" with patient's mail but she will look for it and will discuss program with patient. She agrees to contact [facility] in future if patient will agree to services.  Plan to close out case per protocol. Will be glad to reopen if patient agrees in the future. Royetta Crochet. Laymond Purser, RN, BSN, [place] [phone]

## 2016-05-21 ENCOUNTER — Encounter: Payer: Self-pay | Admitting: *Deleted

## 2016-05-22 ENCOUNTER — Encounter: Payer: Self-pay | Admitting: Family

## 2016-05-26 ENCOUNTER — Ambulatory Visit (HOSPITAL_COMMUNITY): Payer: Medicare Other

## 2016-05-26 ENCOUNTER — Ambulatory Visit: Payer: Medicare Other | Admitting: Family

## 2016-06-04 ENCOUNTER — Encounter: Payer: Self-pay | Admitting: Emergency Medicine

## 2016-06-04 ENCOUNTER — Ambulatory Visit (INDEPENDENT_AMBULATORY_CARE_PROVIDER_SITE_OTHER): Payer: Medicare Other | Admitting: Emergency Medicine

## 2016-06-04 VITALS — BP 92/60 | HR 100 | Ht 64.0 in | Wt 119.0 lb

## 2016-06-04 DIAGNOSIS — R911 Solitary pulmonary nodule: Secondary | ICD-10-CM | POA: Diagnosis not present

## 2016-06-04 DIAGNOSIS — R918 Other nonspecific abnormal finding of lung field: Secondary | ICD-10-CM | POA: Insufficient documentation

## 2016-06-04 NOTE — Patient Instructions (Addendum)
We will plan to repeat your CT chest  Continue your DuoNebs as you are taking them  Continue your oxygen at all times.  Follow with Dr Lamonte Sakai next available to discus the scan.

## 2016-06-04 NOTE — Progress Notes (Signed)
Subjective:    Patient ID: Kenneth Merritt, male    DOB: 29-Apr-1951, 65 y.o.   MRN: [id number]  HPI 65 year old man with History tobacco use 80 pack years, coronary artery disease, Atrial fibrillation, Severe COPD diagnosed by pulmonary function testing in August 2010. Chronic hypoxemic respiratory failure. He was admitted to the hospital In May and June 2017 with sepsis related to diverticular disease. This was Complicated by atrial fibrillation with RVR.  He was found to have a right Upper lobe superior segmental nodular opacity consistent with possible aspiration pneumonia. He was treated for this with antibiotics. A repeat chest x-ray performed on 05/10/16 showed some mild interstitial markings but Apparent resolution of his nodular infiltrate.  He is on DuoNeb 4x a day. On O2 at 2-3L/min. He has freq cough, sputum production.    Review of Systems As per HPI   Past Medical History:  Diagnosis Date  . Atrial fibrillation (HCC)    post-op a-fib. 3/11. post Aortobifem , /  Atrial fibrillation from nebulizer treatment  May, 2012, while hospitalized  . CAD (coronary artery disease), native coronary artery    catheterization 06/2009.Marland KitchenMarland Kitchen70% proximal LAD and 30% proximal RCA. normal LV function, no intervention planned prior to surgery for aorta. LV normal function  by catheter 8/10.   . Cancer [facility]) 2013   Prostate Radiation  . Chronic pain syndrome   . Chronic thoracic back pain   . COPD (chronic obstructive pulmonary disease) ([place])    severe by PFTs August 2010  . DJD (degenerative joint disease)   . Dyslipidemia   . Ejection fraction    60%, echo, May, 2012, rapid atrial fib at the time  . GERD (gastroesophageal reflux disease)   . Myocardial infarction ([place]) 2011  . On home O2    2L N/C  . Peripheral vascular disease, unspecified ([place])    bilateral intermittent claudication  . Pneumothorax     2011  before aortobifemoral surgery,  resolved  . Renal artery stenosis ([place])    presumably corrected at time of aortobifem surgery March, 2011  . 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 Tobacco user     stop smoking     Family History  Problem Relation Age of Onset  . Cancer Father     prostate  . Heart attack Father      Social History   Social History  . Marital status: Married    Spouse name: N/A  . Number of children: N/A  . Years of education: N/A   Occupational History  . Not on file.   Social History Main Topics  . Smoking status: Former Smoker    Packs/day: 2.00    Years: 40.00    Types: Cigarettes    Quit date: 01/11/2010  . Smokeless tobacco: Never Used  . Alcohol use No  . Drug use: No  . Sexual activity: Not on file   Other Topics Concern  . Not on file   Social History Narrative   Married, retired.      Allergies  Allergen Reactions  . Levaquin [Levofloxacin In D5w] Other (See Comments)    Pt states it caused dry mouth and rectal bleeding.      Outpatient Medications Prior to Visit  Medication Sig Dispense Refill  . albuterol (PROVENTIL HFA;VENTOLIN HFA) 108 (90 BASE) MCG/ACT inhaler Inhale 2 puffs into the lungs  every 4 (four) hours as needed for wheezing or shortness of breath. 1 Inhaler 0  . albuterol (PROVENTIL) (2.5 MG/3ML) 0.083% nebulizer solution Take 3 mLs (2.5 mg total) by nebulization every 4 (four) hours as needed for wheezing or shortness of breath. 75 mL 12  . ALPRAZolam (XANAX) 0.25 MG tablet Take 0.25 mg by mouth 3 (three) times daily as needed for anxiety or sleep.   0  . atorvastatin (LIPITOR) 20 MG tablet Take 1 tablet (20 mg total) by mouth daily at 6 PM. 30 tablet 1  . diltiazem (CARDIZEM CD) 240 MG 24 hr capsule Take 1 capsule (240 mg total) by mouth daily. 30 capsule 0  . ELIQUIS 5 MG TABS tablet TAKE 1 TABLET BY MOUTH TWICE A DAY 60 tablet 3  . famotidine (PEPCID) 20 MG tablet Take 1 tablet (20 mg total) by mouth 2  (two) times daily. 30 tablet 0  . feeding supplement, ENSURE ENLIVE, (ENSURE ENLIVE) LIQD Take 237 mLs by mouth 2 (two) times daily between meals. 237 mL 12  . fluconazole (DIFLUCAN) 100 MG tablet Take 1 tablet (100 mg total) by mouth daily. 26 tablet 0  . food thickener (THICK IT) POWD As needed  0  . furosemide (LASIX) 20 MG tablet Take 1 tablet (20 mg total) by mouth daily as needed for fluid or edema. as needed for swelling/weight gain of >2lbs overnight or 4-5lbs in 1 week 30 tablet 0  . gabapentin (NEURONTIN) 300 MG capsule Take 300 mg by mouth 3 (three) times daily.    [name] guaiFENesin (MUCINEX) 600 MG 12 hr tablet Take 1 tablet (600 mg total) by mouth 2 (two) times daily. 30 tablet 0  . HYDROcodone-acetaminophen (NORCO/VICODIN) 5-325 MG tablet Take 1-2 tablets by mouth every 6 (six) hours as needed for moderate pain or severe pain.  0  . ipratropium (ATROVENT) 0.02 % nebulizer solution Take 0.5 mg by nebulization every 4 (four) hours as needed for wheezing or shortness of breath.    . metFORMIN (GLUCOPHAGE) 1000 MG tablet Take 1,000 mg by mouth daily with breakfast.    . Multiple Vitamin (MULTIVITAMIN WITH MINERALS) TABS tablet Take 1 tablet by mouth daily.    . nitroGLYCERIN (NITROSTAT) 0.4 MG SL tablet Place 1 tablet (0.4 mg total) under the tongue every 5 (five) minutes x 3 doses as needed for chest pain. 25 tablet 3  . omeprazole (PRILOSEC) 20 MG capsule Take 20 mg by mouth every morning.     . polyethylene glycol (MIRALAX / GLYCOLAX) packet Take 17 g by mouth daily. 14 each 0  . predniSONE (DELTASONE) 10 MG tablet Take 50mg  daily for 3days,Take 40mg  daily for 3days,Take 30mg  daily for 3days,Take 20mg  daily for 3days,Take 10mg  daily for 3days, then stop 45 tablet 0   No facility-administered medications prior to visit.         Objective:   Physical Exam Vitals:   06/04/16 1455  BP: 92/60  Pulse: 100  SpO2: 93%  Weight: 119 lb (54 kg)  Height: 5\' 4"  (1.626 m)   Gen: Debilitated  ill-appearing man, mild dyspnea, in a wheelchair  ENT: No lesions,  mouth clear, Upper airway noise sounds like secretions that he is having difficulty clearing/managing  Neck: No JVD, no TMG, no carotid bruits  Lungs: No use of accessory muscles, Very distant bilaterally, no wheezing  Cardiovascular: RRR, heart sounds normal, no murmur or gallops, no peripheral edema  Musculoskeletal: No deformities, no cyanosis or clubbing  Neuro: alert, non  focal  Skin: dusky       Assessment & Plan:  COPD (chronic obstructive pulmonary disease) ([place]) I will continue him on DuoNeb 4 times a day for now. Continue his oxygen at all times.  Pulmonary nodules 2 distinct areas of opacity one in the right upper lobe, one in the right lower lobe worrisome for possible malignancy. The only reassuring aspect of the evaluation is that these were not present one month earlier on a CT scan from May 2017. He is a very poor candidate for biopsy given the severity of his COPD and his overall chronic illness. It may be that he would be best served by following, assessing risk using serial CTs and possibly PET scan, and moving to empiric radiation therapy without a biopsy if our suspicion for malignancy increases. For starters we will perform a repeat CT scan of the chest to compare with his priors  Baltazar Apo, MD, PhD 06/04/2016, 3:36 PM [facility] [phone] or if no answer [phone]

## 2016-06-04 NOTE — Assessment & Plan Note (Signed)
2 distinct areas of opacity one in the right upper lobe, one in the right lower lobe worrisome for possible malignancy. The only reassuring aspect of the evaluation is that these were not present one month earlier on a CT scan from May 2017. He is a very poor candidate for biopsy given the severity of his COPD and his overall chronic illness. It may be that he would be best served by following, assessing risk using serial CTs and possibly PET scan, and moving to empiric radiation therapy without a biopsy if our suspicion for malignancy increases. For starters we will perform a repeat CT scan of the chest to compare with his priors

## 2016-06-04 NOTE — Assessment & Plan Note (Signed)
I will continue him on DuoNeb 4 times a day for now. Continue his oxygen at all times.

## 2016-06-10 ENCOUNTER — Inpatient Hospital Stay: Admission: RE | Admit: 2016-06-10 | Payer: Medicare Other | Source: Ambulatory Visit

## 2016-06-11 ENCOUNTER — Ambulatory Visit (INDEPENDENT_AMBULATORY_CARE_PROVIDER_SITE_OTHER): Payer: Medicare Other | Admitting: Cardiology

## 2016-06-11 ENCOUNTER — Encounter: Payer: Self-pay | Admitting: Cardiology

## 2016-06-11 VITALS — BP 107/62 | HR 113 | Ht 64.0 in | Wt 118.4 lb

## 2016-06-11 DIAGNOSIS — E785 Hyperlipidemia, unspecified: Secondary | ICD-10-CM | POA: Diagnosis not present

## 2016-06-11 DIAGNOSIS — I480 Paroxysmal atrial fibrillation: Secondary | ICD-10-CM | POA: Diagnosis not present

## 2016-06-11 DIAGNOSIS — I25110 Atherosclerotic heart disease of native coronary artery with unstable angina pectoris: Secondary | ICD-10-CM

## 2016-06-11 NOTE — Patient Instructions (Signed)

## 2016-06-11 NOTE — Progress Notes (Signed)
Clinical Summary Kenneth Merritt is a 65 y.o.male previous patient of Dr Kenneth Merritt, this is our first visit together. He is seen for the following medical problems.  1. CAD - previous disease noted on cath 06/2009, managed medically - admit 05/2016 with demand ischemia, mild troponin elevation in setting of COPD exacerbation and SIRS without clear infection.  - denies any chest pain - compliant with meds.    2. PAF - occasional palpitations when using inhaler, otherwise no troubles - no bleeding troubles on eliquis.   3. COPD - compliant with inhalers  4. Hyperlipidemia - compliant with statin Past Medical History:  Diagnosis Date  . Atrial fibrillation (HCC)    post-op a-fib. 3/11. post Aortobifem , /  Atrial fibrillation from nebulizer treatment  May, 2012, while hospitalized  . CAD (coronary artery disease), native coronary artery    catheterization 06/2009.Marland KitchenMarland Kitchen70% proximal LAD and 30% proximal RCA. normal LV function, no intervention planned prior to surgery for aorta. LV normal function  by catheter 8/10.   . Cancer Kenneth Merritt) 2013   Prostate Radiation  . Chronic pain syndrome   . Chronic thoracic back pain   . COPD (chronic obstructive pulmonary disease) ([place])    severe by PFTs August 2010  . DJD (degenerative joint disease)   . Dyslipidemia   . Ejection fraction    60%, echo, May, 2012, rapid atrial fib at the time  . GERD (gastroesophageal reflux disease)   . Myocardial infarction ([place]) 2011  . On home O2    2L N/C  . Peripheral vascular disease, unspecified ([place])    bilateral intermittent claudication  . Pneumothorax     2011  before aortobifemoral surgery,  resolved  . Renal artery stenosis ([place])     presumably corrected at time of aortobifem surgery March, 2011  . 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 Tobacco user     stop smoking     Allergies  Allergen Reactions  . Levaquin  [Levofloxacin In D5w] Other (See Comments)    Pt states it caused dry mouth and rectal bleeding.      Current Outpatient Prescriptions  Medication Sig Dispense Refill  . albuterol (PROVENTIL HFA;VENTOLIN HFA) 108 (90 BASE) MCG/ACT inhaler Inhale 2 puffs into the lungs every 4 (four) hours as needed for wheezing or shortness of breath. 1 Inhaler 0  . albuterol (PROVENTIL) (2.5 MG/3ML) 0.083% nebulizer solution Take 3 mLs (2.5 mg total) by nebulization every 4 (four) hours as needed for wheezing or shortness of breath. 75 mL 12  . ALPRAZolam (XANAX) 0.25 MG tablet Take 0.25 mg by mouth 3 (three) times daily as needed for anxiety or sleep.   0  . atorvastatin (LIPITOR) 20 MG tablet Take 1 tablet (20 mg total) by mouth daily at 6 PM. 30 tablet 1  . diltiazem (CARDIZEM CD) 240 MG 24 hr capsule Take 1 capsule (240 mg total) by mouth daily. 30 capsule 0  . ELIQUIS 5 MG TABS tablet TAKE 1 TABLET BY MOUTH TWICE A DAY 60 tablet 3  . famotidine (PEPCID) 20 MG tablet Take 1 tablet (20 mg total) by mouth 2 (two) times daily. 30 tablet 0  . feeding supplement, ENSURE ENLIVE, (ENSURE ENLIVE) LIQD Take 237 mLs by mouth 2 (two) times daily between meals. 237 mL 12  . fluconazole (DIFLUCAN) 100 MG tablet Take 1 tablet (100  mg total) by mouth daily. 26 tablet 0  . food thickener (THICK IT) POWD As needed  0  . furosemide (LASIX) 20 MG tablet Take 1 tablet (20 mg total) by mouth daily as needed for fluid or edema. as needed for swelling/weight gain of >2lbs overnight or 4-5lbs in 1 week 30 tablet 0  . gabapentin (NEURONTIN) 300 MG capsule Take 300 mg by mouth 3 (three) times daily.    [name] guaiFENesin (MUCINEX) 600 MG 12 hr tablet Take 1 tablet (600 mg total) by mouth 2 (two) times daily. 30 tablet 0  . HYDROcodone-acetaminophen (NORCO/VICODIN) 5-325 MG tablet Take 1-2 tablets by mouth every 6 (six) hours as needed for moderate pain or severe pain.  0  . ipratropium (ATROVENT) 0.02 % nebulizer solution Take 0.5 mg by  nebulization every 4 (four) hours as needed for wheezing or shortness of breath.    . metFORMIN (GLUCOPHAGE) 1000 MG tablet Take 1,000 mg by mouth daily with breakfast.    . Multiple Vitamin (MULTIVITAMIN WITH MINERALS) TABS tablet Take 1 tablet by mouth daily.    . nitroGLYCERIN (NITROSTAT) 0.4 MG SL tablet Place 1 tablet (0.4 mg total) under the tongue every 5 (five) minutes x 3 doses as needed for chest pain. 25 tablet 3  . omeprazole (PRILOSEC) 20 MG capsule Take 20 mg by mouth every morning.     . polyethylene glycol (MIRALAX / GLYCOLAX) packet Take 17 g by mouth daily. 14 each 0  . predniSONE (DELTASONE) 10 MG tablet Take 50mg  daily for 3days,Take 40mg  daily for 3days,Take 30mg  daily for 3days,Take 20mg  daily for 3days,Take 10mg  daily for 3days, then stop 45 tablet 0   No current facility-administered medications for this visit.      Past Surgical History:  Procedure Laterality Date  . ABDOMINAL AORTIC ANEURYSM REPAIR  01/11/2010  . INGUINAL HERNIA REPAIR     RIGHT  . PR VEIN BYPASS GRAFT,AORTO-FEM-POP       Allergies  Allergen Reactions  . Levaquin [Levofloxacin In D5w] Other (See Comments)    Pt states it caused dry mouth and rectal bleeding.       Family History  Problem Relation Age of Onset  . Cancer Father     prostate  . Heart attack Father      Social History Kenneth Merritt reports that he quit smoking about 6 years ago. His smoking use included Cigarettes. He has a 80.00 pack-year smoking history. He has never used smokeless tobacco. Kenneth Merritt reports that he does not drink alcohol.   Review of Systems CONSTITUTIONAL: No weight loss, fever, chills, weakness or fatigue.  HEENT: Eyes: No visual loss, blurred vision, double vision or yellow sclerae.No hearing loss, sneezing, congestion, runny nose or sore throat.  SKIN: No rash or itching.  CARDIOVASCULAR: per HPI RESPIRATORY: No shortness of breath, cough or sputum.  GASTROINTESTINAL: No anorexia, nausea,  vomiting or diarrhea. No abdominal pain or blood.  GENITOURINARY: No burning on urination, no polyuria NEUROLOGICAL: No headache, dizziness, syncope, paralysis, ataxia, numbness or tingling in the extremities. No change in bowel or bladder control.  MUSCULOSKELETAL: No muscle, back pain, joint pain or stiffness.  LYMPHATICS: No enlarged nodes. No history of splenectomy.  PSYCHIATRIC: No history of depression or anxiety.  ENDOCRINOLOGIC: No reports of sweating, cold or heat intolerance. No polyuria or polydipsia.  [name]   Physical Examination Vitals:   06/11/16 1551  BP: 107/62  Pulse: (!) 113   Vitals:   06/11/16 1551  Weight: 118  lb 6.4 oz (53.7 kg)  Height: 5\' 4"  (1.626 m)    Gen: resting comfortably, no acute distress HEENT: no scleral icterus, pupils equal round and reactive, no palptable cervical adenopathy,  CV: RRR, no m/r/g, no jvd Resp: Clear to auscultation bilaterally GI: abdomen is soft, non-tender, non-distended, normal bowel sounds, no hepatosplenomegaly MSK: extremities are warm, no edema.  Skin: warm, no rash Neuro:  no focal deficits Psych: appropriate affect   Diagnostic Studies  03/2016 echo Study Conclusions  - Left ventricle: The cavity size was normal. Wall thickness was   normal. Systolic function was normal. The estimated ejection   fraction was in the range of 55% to 60%. Wall motion was normal;   there were no regional wall motion abnormalities. Doppler   parameters are consistent with abnormal left ventricular   relaxation (grade 1 diastolic dysfunction). - Mitral valve: Calcified annulus.  Impressions:  - Technically difficult; normal LV function; grade 1 diastolic   dysfunction; traced MR and TR.  06/2009 Cath RESULTS:  Left main coronary artery:  The left main coronary was free of  significant disease.   Left anterior descending artery:  The ascending artery was diffusely  irregular.  This gave rise to two septal perforators in  several small  diagonal branches.  There was 78% narrowing in the proximal LAD, which  appeared somewhat worse before nitroglycerin, but improved to 70% after  nitroglycerin.   Circumflex artery:  The circumflex artery gave rise to a small marginal  Jaelon Gatley and atrial Shomari Matusik, a second marginal Traivon Morrical and a posterolateral  Deaisa Merida, these vessels were free of significant disease.   Right coronary artery:  The right coronary artery was a moderate-sized  vessel gave rise to conus Lorie Melichar, two right ventricle branches, a  posterior descending Tavaras Goody, and two posterolateral branches.  There was  30% narrowing in the proximal right coronary artery.   Left ventriculogram:  The left ventriculogram performed in the RAO  projection showed good wall motion with no areas of hypokinesis.  The  estimated ejection fraction was 60%.   The aortic pressure was 112/67, mean of 86.  Left neck pressure was  112/8.   Distal aortogram:  A distal aortogram was performed, which showed total  occlusion of the aorta just below the level of the renal arteries.  The  left renal artery had a 70% ostial stenosis.  The occlusion was quite  unusual and was very smooth and not tapered.  We could see the right  iliac fill late by collaterals.   CONCLUSION:  1. Coronary artery disease status post recent non-ST-elevation MI with      70-80% narrowing in the proximal LAD, no significant obstruction in      circumflex artery, 30% narrowing in the proximal right coronary      artery and normal LV function.  2. 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  Assessment and Plan  1. CAD - no recent symptoms, continue current meds  2. PAF - doing well, we will continue current meds. CHADS2Vasc score is 2, continue anticoag  3. Hyperlipidemia - request pcp labs - continue statin  F/u 6 months  Arnoldo Lenis, M.D.

## 2016-06-12 ENCOUNTER — Inpatient Hospital Stay: Admission: RE | Admit: 2016-06-12 | Payer: Medicare Other | Source: Ambulatory Visit

## 2016-06-12 ENCOUNTER — Telehealth: Payer: Self-pay | Admitting: Emergency Medicine

## 2016-06-12 ENCOUNTER — Encounter: Payer: Self-pay | Admitting: *Deleted

## 2016-06-12 NOTE — Telephone Encounter (Signed)
Will forward to RB as FYI that pt had to cancel ct chest again due to car trouble

## 2016-06-27 ENCOUNTER — Ambulatory Visit (INDEPENDENT_AMBULATORY_CARE_PROVIDER_SITE_OTHER)
Admission: RE | Admit: 2016-06-27 | Discharge: 2016-06-27 | Disposition: A | Discharge status: Home / Self Care | Payer: Medicare Other | Source: Ambulatory Visit | Attending: Emergency Medicine | Admitting: Emergency Medicine

## 2016-06-27 DIAGNOSIS — R911 Solitary pulmonary nodule: Secondary | ICD-10-CM | POA: Diagnosis not present

## 2016-06-28 ENCOUNTER — Other Ambulatory Visit: Payer: Self-pay | Admitting: Cardiology

## 2016-07-18 ENCOUNTER — Telehealth: Payer: Self-pay | Admitting: Emergency Medicine

## 2016-07-18 NOTE — Telephone Encounter (Addendum)
lmtcb x1 for pt. If pt does not want to wait until he sees RB for his results, then he will need to see a NP.

## 2016-07-18 NOTE — Telephone Encounter (Signed)
Notes Recorded by Collene Gobble, MD on 07/16/2016 at 7:34 PM EDT Please call pt and schedule OV to discuss results. -------- Pt called back, scheduled pt for next available appt with RB- 08/28/16.  Pt is requesting results before this appt.    Ria Comment please advise if this appt will need to be scheduled sooner with a NP in RB's absence to relay CT results.  Thanks!

## 2016-07-21 NOTE — Telephone Encounter (Signed)
Pt returned call Appt scheduled with TP for 9.22.17 @ 12N (pt unable to come this week d/t transportation) Pt is aware to contact the office if anything further is needed Will sign off

## 2016-07-21 NOTE — Telephone Encounter (Signed)
lmtcb x2 for pt. 

## 2016-07-23 ENCOUNTER — Encounter (HOSPITAL_COMMUNITY): Payer: Self-pay | Admitting: Emergency Medicine

## 2016-07-23 ENCOUNTER — Inpatient Hospital Stay (HOSPITAL_COMMUNITY)
Admission: EM | Admit: 2016-07-23 | Discharge: 2016-07-27 | DRG: 190 | Disposition: A | Discharge status: Home Health | Payer: Medicare Other | Attending: Internal Medicine | Admitting: Internal Medicine

## 2016-07-23 ENCOUNTER — Emergency Department (HOSPITAL_COMMUNITY): Payer: Medicare Other

## 2016-07-23 DIAGNOSIS — R079 Chest pain, unspecified: Secondary | ICD-10-CM | POA: Diagnosis not present

## 2016-07-23 DIAGNOSIS — G894 Chronic pain syndrome: Secondary | ICD-10-CM | POA: Diagnosis present

## 2016-07-23 DIAGNOSIS — Z8249 Family history of ischemic heart disease and other diseases of the circulatory system: Secondary | ICD-10-CM

## 2016-07-23 DIAGNOSIS — Z794 Long term (current) use of insulin: Secondary | ICD-10-CM

## 2016-07-23 DIAGNOSIS — R7989 Other specified abnormal findings of blood chemistry: Secondary | ICD-10-CM | POA: Diagnosis present

## 2016-07-23 DIAGNOSIS — K219 Gastro-esophageal reflux disease without esophagitis: Secondary | ICD-10-CM | POA: Diagnosis present

## 2016-07-23 DIAGNOSIS — D509 Iron deficiency anemia, unspecified: Secondary | ICD-10-CM | POA: Diagnosis present

## 2016-07-23 DIAGNOSIS — I482 Chronic atrial fibrillation: Secondary | ICD-10-CM | POA: Diagnosis present

## 2016-07-23 DIAGNOSIS — Z7901 Long term (current) use of anticoagulants: Secondary | ICD-10-CM

## 2016-07-23 DIAGNOSIS — E876 Hypokalemia: Secondary | ICD-10-CM | POA: Diagnosis present

## 2016-07-23 DIAGNOSIS — R0789 Other chest pain: Secondary | ICD-10-CM | POA: Diagnosis present

## 2016-07-23 DIAGNOSIS — Z9981 Dependence on supplemental oxygen: Secondary | ICD-10-CM

## 2016-07-23 DIAGNOSIS — I4891 Unspecified atrial fibrillation: Secondary | ICD-10-CM | POA: Diagnosis present

## 2016-07-23 DIAGNOSIS — J189 Pneumonia, unspecified organism: Secondary | ICD-10-CM | POA: Diagnosis present

## 2016-07-23 DIAGNOSIS — E785 Hyperlipidemia, unspecified: Secondary | ICD-10-CM | POA: Diagnosis present

## 2016-07-23 DIAGNOSIS — E118 Type 2 diabetes mellitus with unspecified complications: Secondary | ICD-10-CM | POA: Diagnosis present

## 2016-07-23 DIAGNOSIS — I480 Paroxysmal atrial fibrillation: Secondary | ICD-10-CM | POA: Diagnosis present

## 2016-07-23 DIAGNOSIS — R059 Cough, unspecified: Secondary | ICD-10-CM

## 2016-07-23 DIAGNOSIS — R05 Cough: Secondary | ICD-10-CM

## 2016-07-23 DIAGNOSIS — Z87891 Personal history of nicotine dependence: Secondary | ICD-10-CM

## 2016-07-23 DIAGNOSIS — I252 Old myocardial infarction: Secondary | ICD-10-CM

## 2016-07-23 DIAGNOSIS — E1151 Type 2 diabetes mellitus with diabetic peripheral angiopathy without gangrene: Secondary | ICD-10-CM | POA: Diagnosis present

## 2016-07-23 DIAGNOSIS — J440 Chronic obstructive pulmonary disease with acute lower respiratory infection: Secondary | ICD-10-CM | POA: Diagnosis not present

## 2016-07-23 DIAGNOSIS — J9611 Chronic respiratory failure with hypoxia: Secondary | ICD-10-CM | POA: Diagnosis present

## 2016-07-23 DIAGNOSIS — Z7952 Long term (current) use of systemic steroids: Secondary | ICD-10-CM

## 2016-07-23 DIAGNOSIS — D6489 Other specified anemias: Secondary | ICD-10-CM | POA: Diagnosis present

## 2016-07-23 DIAGNOSIS — R778 Other specified abnormalities of plasma proteins: Secondary | ICD-10-CM

## 2016-07-23 DIAGNOSIS — E875 Hyperkalemia: Secondary | ICD-10-CM | POA: Diagnosis present

## 2016-07-23 DIAGNOSIS — R000 Tachycardia, unspecified: Secondary | ICD-10-CM | POA: Diagnosis present

## 2016-07-23 DIAGNOSIS — I2510 Atherosclerotic heart disease of native coronary artery without angina pectoris: Secondary | ICD-10-CM | POA: Diagnosis present

## 2016-07-23 DIAGNOSIS — J441 Chronic obstructive pulmonary disease with (acute) exacerbation: Secondary | ICD-10-CM | POA: Diagnosis present

## 2016-07-23 LAB — CBC
HCT: 34.6 % — ABNORMAL LOW (ref 39.0–52.0)
Hemoglobin: 10.3 g/dL — ABNORMAL LOW (ref 13.0–17.0)
MCH: 26.4 pg (ref 26.0–34.0)
MCHC: 29.8 g/dL — AB (ref 30.0–36.0)
MCV: 88.7 fL (ref 78.0–100.0)
PLATELETS: 399 10*3/uL (ref 150–400)
RBC: 3.9 MIL/uL — AB (ref 4.22–5.81)
RDW: 15.7 % — AB (ref 11.5–15.5)
WBC: 8.7 10*3/uL (ref 4.0–10.5)

## 2016-07-23 LAB — BASIC METABOLIC PANEL
Anion gap: 11 (ref 5–15)
BUN: 8 mg/dL (ref 6–20)
CALCIUM: 8.5 mg/dL — AB (ref 8.9–10.3)
CHLORIDE: 101 mmol/L (ref 101–111)
CO2: 31 mmol/L (ref 22–32)
CREATININE: 0.47 mg/dL — AB (ref 0.61–1.24)
GFR calc non Af Amer: >60 mL/min (ref >60)
GLUCOSE: 142 mg/dL — AB (ref 65–99)
Potassium: 3.1 mmol/L — ABNORMAL LOW (ref 3.5–5.1)
Sodium: 143 mmol/L (ref 135–145)

## 2016-07-23 LAB — TROPONIN I: TROPONIN I: 0.04 ng/mL — AB (ref <0.03)

## 2016-07-23 MED ORDER — IPRATROPIUM-ALBUTEROL 0.5-2.5 (3) MG/3ML IN SOLN: 3.0000 mL | Freq: Once | RESPIRATORY_TRACT | Status: DC

## 2016-07-23 MED ORDER — LEVALBUTEROL HCL 0.63 MG/3ML IN NEBU
0.6300 mg | INHALATION_SOLUTION | Freq: Once | RESPIRATORY_TRACT | Status: AC
  Administered 2016-07-23: 0.63 mg via RESPIRATORY_TRACT
  Filled 2016-07-23: qty 3

## 2016-07-23 MED ORDER — LEVALBUTEROL HCL 0.63 MG/3ML IN NEBU
0.6300 mg | INHALATION_SOLUTION | Freq: Once | RESPIRATORY_TRACT | Status: AC
  Administered 2016-07-24: 0.63 mg via RESPIRATORY_TRACT
  Filled 2016-07-23: qty 3

## 2016-07-23 MED ORDER — AZITHROMYCIN 250 MG PO TABS
500.0000 mg | ORAL_TABLET | Freq: Once | ORAL | Status: AC
  Administered 2016-07-24: 500 mg via ORAL
  Filled 2016-07-23: qty 2

## 2016-07-23 MED ORDER — PREDNISONE 20 MG PO TABS
40.0000 mg | ORAL_TABLET | Freq: Once | ORAL | Status: AC
  Administered 2016-07-24: 40 mg via ORAL
  Filled 2016-07-23: qty 2

## 2016-07-23 MED ORDER — NITROGLYCERIN 0.4 MG SL SUBL
0.4000 mg | SUBLINGUAL_TABLET | SUBLINGUAL | Status: DC | PRN
  Administered 2016-07-23: 0.4 mg via SUBLINGUAL
  Filled 2016-07-23: qty 1

## 2016-07-23 NOTE — ED Notes (Signed)
CRITICAL VALUE ALERT  Critical value received:  Troponin 0.04  Date of notification:  07/23/2016  Time of notification:  2151  Critical value read back:Yes.    Nurse who received alert:  Fabio Neighbors RN  MD notified (1st page):  Dr. Leonette Monarch  Time of first page:  2159  MD notified (2nd page):  Time of second page:  Responding MD:     Time MD responded:

## 2016-07-23 NOTE — H&P (Signed)
History and Physical    Kenneth Merritt [id number] DOB: 05-19-1951 DOA: 07/23/2016  PCP: Neale Burly, MD  Patient coming from: Home   Chief Complaint: Atypical chest pain   HPI: Kenneth Merritt is a 65 y.o. male with a past medical history significant for atrial fibrillation, chronic pain syndrome, COPD on home oxygen, GERD, and CAD presents with complaints of central chest pain that began earlier today. Patient states he is always short of breath which is normal. He does require 2L of home oxygen. He has associated cough with some sputum. He denies any fever, chills, nausea, vomiting, diarrhea and abdominal pain. While in the ED, he was given nitroglycerin, which improved his pain. He was admitted for further observation of chest pain.   ED Course: Potassium 3.1, troponin 0.4, Hgb 10.3. EKG shows sinus tach. CXR revealed Ill-defined patchy opacity in the right midlung zone, likely in the middle lobe, concerning for infection/pneumonia.  Review of Systems: As per HPI otherwise 10 point review of systems negative.    Past Medical History:  Diagnosis Date  . Atrial fibrillation (HCC)    post-op a-fib. 3/11. post Aortobifem , /  Atrial fibrillation from nebulizer treatment  May, 2012, while hospitalized  . CAD (coronary artery disease), native coronary artery    catheterization 06/2009.Marland KitchenMarland Kitchen70% proximal LAD and 30% proximal RCA. normal LV function, no intervention planned prior to surgery for aorta. LV normal function  by catheter 8/10.   . Cancer [facility]) 2013   Prostate Radiation  . Chronic pain syndrome   . Chronic thoracic back pain   . COPD (chronic obstructive pulmonary disease) ([place])    severe by PFTs August 2010  . DJD (degenerative joint disease)   . Dyslipidemia   . Ejection fraction    60%, echo, May, 2012, rapid atrial fib at the time  . GERD (gastroesophageal reflux disease)   . Myocardial infarction ([place]) 2011  . On home O2    2L N/C  . Peripheral vascular disease,  unspecified ([place])    bilateral intermittent claudication  . Pneumothorax     2011  before aortobifemoral surgery,  resolved  . Renal artery stenosis ([place])     presumably corrected at time of aortobifem surgery March, 2011  . 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 Tobacco user     stop smoking    Past Surgical History:  Procedure Laterality Date  . ABDOMINAL AORTIC ANEURYSM REPAIR  01/11/2010  . INGUINAL HERNIA REPAIR     RIGHT  . PR VEIN BYPASS GRAFT,AORTO-FEM-POP       reports that he quit smoking about 6 years ago. His smoking use included Cigarettes. He has a 80.00 pack-year smoking history. He has never used smokeless tobacco. He reports that he does not drink alcohol or use drugs.  Allergies  Allergen Reactions  . Levaquin [Levofloxacin In D5w] Other (See Comments)    Pt states it caused dry mouth and rectal bleeding.     Family History  Problem Relation Age of Onset  . Cancer Father     prostate  . Heart attack Father      Prior to Admission medications   Medication Sig Start Date End Date Taking? Authorizing Provider  albuterol (PROVENTIL HFA;VENTOLIN HFA) 108 (90 BASE) MCG/ACT inhaler Inhale 2 puffs into the lungs every 4 (four) hours as needed for wheezing or shortness  of breath. 09/19/15  Yes Donne [name], MD  albuterol (PROVENTIL) (2.5 MG/3ML) 0.083% nebulizer solution Take 3 mLs (2.5 mg total) by nebulization every 4 (four) hours as needed for wheezing or shortness of breath. 03/15/15  Yes [name], MD  ALPRAZolam [name]) 0.25 MG tablet Take 0.25 mg by mouth 3 (three) times daily as needed for anxiety or sleep.  12/17/15  Yes Historical Provider, MD  atorvastatin (LIPITOR) 20 MG tablet Take 1 tablet (20 mg total) by mouth daily at 6 PM. 05/01/16  Yes Hosie Poisson, MD  diltiazem (CARDIZEM CD) 240 MG 24 hr capsule Take 1 capsule (240 mg total) by mouth daily. 05/01/16  Yes  Hosie Poisson, MD  ELIQUIS 5 MG TABS tablet TAKE 1 TABLET BY MOUTH TWICE A DAY 06/30/16  Yes Satira Sark, MD  furosemide (LASIX) 20 MG tablet Take 1 tablet (20 mg total) by mouth daily as needed for fluid or edema. as needed for swelling/weight gain of >2lbs overnight or 4-5lbs in 1 week 05/14/16  Yes Lavina Hamman, MD  HYDROcodone-acetaminophen (NORCO/VICODIN) 5-325 MG tablet Take 1-2 tablets by mouth every 6 (six) hours as needed for moderate pain or severe pain. 04/15/16  Yes Domenic Polite, MD  insulin aspart (NOVOLOG) 100 UNIT/ML FlexPen Inject into the skin. Sliding scale.   Yes Historical Provider, MD  ipratropium (ATROVENT) 0.02 % nebulizer solution Take 0.5 mg by nebulization every 4 (four) hours as needed for wheezing or shortness of breath.   Yes Historical Provider, MD  nitroGLYCERIN (NITROSTAT) 0.4 MG SL tablet Place 1 tablet (0.4 mg total) under the tongue every 5 (five) minutes x 3 doses as needed for chest pain. 06/11/15  Yes [name], MD  omeprazole (PRILOSEC) 20 MG capsule Take 20 mg by mouth every morning.  04/21/11  Yes Historical Provider, MD  predniSONE (DELTASONE) 10 MG tablet Take 10 mg by mouth daily.   Yes Historical Provider, MD  gabapentin (NEURONTIN) 300 MG capsule Take 300 mg by mouth 3 (three) times daily.    Historical Provider, MD  metFORMIN (GLUCOPHAGE) 1000 MG tablet Take 1,000 mg by mouth daily with breakfast.    Historical Provider, MD  Multiple Vitamin (MULTIVITAMIN WITH MINERALS) TABS tablet Take 1 tablet by mouth daily. Patient not taking: Reported on 07/23/2016 05/01/16   Hosie Poisson, MD    Physical Exam: Vitals:   07/23/16 2230 07/23/16 2300 07/23/16 2314 07/23/16 2330  BP: 111/68 109/72  118/78  Pulse: 96 90  101  Resp: 11 16  17   Temp:      TempSrc:      SpO2: 100% 100% 99% 97%  Weight:      Height:          Constitutional: NAD, calm, comfortable Vitals:   07/23/16 2230 07/23/16 2300 07/23/16 2314 07/23/16 2330  BP: 111/68 109/72  118/78   Pulse: 96 90  101  Resp: 11 16  17   Temp:      TempSrc:      SpO2: 100% 100% 99% 97%  Weight:      Height:       Eyes: PERRL, lids and conjunctivae normal ENMT: Mucous membranes are moist. Posterior pharynx clear of any exudate or lesions.Normal dentition.  Neck: normal, supple, no masses, no thyromegaly Respiratory: clear to auscultation bilaterally, no wheezing, no crackles. Normal respiratory effort. No accessory muscle use.  Cardiovascular: Regular rate and rhythm, no murmurs / rubs / gallops. No extremity edema. 2+ pedal pulses. No carotid bruits.  Abdomen: no tenderness, no masses palpated. No hepatosplenomegaly. Bowel sounds positive.  Musculoskeletal: no clubbing / cyanosis. No joint deformity upper and lower extremities. Good ROM, no contractures. Normal muscle tone.  Skin: no rashes, lesions, ulcers. No induration Neurologic: CN 2-12 grossly intact. Sensation intact, DTR normal. Strength 5/5 in all 4.  Psychiatric: Normal judgment and insight. Alert and oriented x 3. Normal mood.    Labs on Admission: I have personally reviewed following labs and imaging studies  CBC:  Recent Labs Lab 07/23/16 2013  WBC 8.7  HGB 10.3*  HCT 34.6*  MCV 88.7  PLT 123XX123   Basic Metabolic Panel:  Recent Labs Lab 07/23/16 2013  NA 143  K 3.1*  CL 101  CO2 31  GLUCOSE 142*  BUN 8  CREATININE 0.47*  CALCIUM 8.5*     Recent Labs Lab 07/23/16 2013  TROPONINI 0.04*   Urine analysis:    Component Value Date/Time   COLORURINE AMBER (A) 05/10/2016 1545   APPEARANCEUR CLEAR 05/10/2016 1545   LABSPEC 1.021 05/10/2016 1545   PHURINE 6.0 05/10/2016 1545   GLUCOSEU NEGATIVE 05/10/2016 1545   HGBUR TRACE (A) 05/10/2016 1545   BILIRUBINUR SMALL (A) 05/10/2016 1545   KETONESUR >80 (A) 05/10/2016 1545   PROTEINUR 30 (A) 05/10/2016 1545   UROBILINOGEN 0.2 01/21/2010 1944   NITRITE NEGATIVE 05/10/2016 1545   LEUKOCYTESUR NEGATIVE 05/10/2016 1545     Radiological Exams on  Admission: Dg Chest 2 View  Result Date: 07/23/2016 CLINICAL DATA:  Chest pain, shortness of breath and dizziness for 2 hours. EXAM: CHEST  2 VIEW COMPARISON:  Radiographs 05/10/2016.  Chest CT 06/27/2016 FINDINGS: Hyperinflation with advanced emphysema. Irregular streaky opacity in the right midlung zone at site of nodule on CT. There is biapical pleural parenchymal scarring. Ill-defined patchy opacity in the right midlung zone likely localizes to the middle lobe on the lateral view. There is no pleural effusion. No pneumothorax. Compression deformities in the midthoracic spine are unchanged from CT. IMPRESSION: Ill-defined patchy opacity in the right midlung zone, likely in the middle lobe, concerning for infection/pneumonia. Irregular streaky opacity superiorly in the right midlung zone at site of irregular nodularity on prior CT. Electronically Signed   By: Jeb Levering M.D.   On: 07/23/2016 20:00    EKG: Independently reviewed. EKG shows sinus tach   Assessment/Plan  1. Atypical chest pain. He has an elevated troponin of 0.04, and unlikely to have ACS.   EKG shows sinus tach. CXR showed Ill-defined patchy opacity in the right midlung zone, likely in the middle lobe, concerning for infection/pneumonia. We will cycle his troponins, and continue with his cardiac meds, including ASA and statin.  2. Possible pneumonia. He is afebrile at this time. CXR revealed Ill-defined patchy opacity in the right midlung zone, likely in the middle lobe, concerning for infection/pneumonia. Follow-up blood and sputum cultures. Will start on IV Zithromax. Continue pulmonary hygiene and supplemental oxygen.  3. COPD. Secondary to pneumonia. Appears compensated at this time. He is a smoker and he is  on 2L of home oxygen. Continue bronchodilators, steroids, antibiotics, and supplemental oxygen.  4. Hx of CAD. Continue Cardizem.  5. Hypokalemia. Replete.  6. GERD. Continue PPI.  7. Chronic pain syndrome. Continue  pain management.  8. Tobacco abuse. Counseled on the importance of cessation.   DVT prophylaxis: Eliquis  Family Communication: Full  Disposition Plan: Discharge home once improved  Consults called: None  Admission status: Inpatient    Orvan Falconer, MD FACP [facility] If 7PM-7AM, please contact night-coverage [email or web address] Password [email or web address]  07/23/2016, 11:45 PM   By signing my name below, I, Collene Leyden, attest that this documentation has been prepared under the direction and in the presence of Orvan Falconer, MD. Electronically signed: Collene Leyden, Scribe. 07/23/16 12:30 AM

## 2016-07-23 NOTE — ED Provider Notes (Signed)
[facility] DEPT Provider Note   CSN: [id number] Arrival date & time: 07/23/16  1858  By signing my name below, I, Ephriam Jenkins, attest that this documentation has been prepared under the direction and in the presence of Orvan Falconer, MD. Electronically signed, Ephriam Jenkins, ED Scribe. 07/23/16. 8:50 PM.  History   Chief Complaint Chief Complaint  Patient presents with  . Chest Pain    HPI  HPI Comments: Kenneth Merritt is a 65 y.o. male with a PMHx of 2 MI's, DM, Afib, CAD, who presents to the Emergency Department complaining of gradually improving, sharp, burning central chest pain onset approximately two hours ago. Pt states it felt like someone "running a knife through my chest". He states that this does not feel like prior MI's but feels more like his A-fib. He took a Nurse, adult and reports no relief. Pt states that he is always short of breath and reports it has been worse than normal recently. He had a breathing treatment at home PTA which relieved his shortness of breath but not his chest pain. Pt reports swelling to bilateral lower extremities that started once he arrived to the ED. No nausea, vomiting, or fever. No Hx of DVT.    The history is provided by the patient. No language interpreter was used.    Past Medical History:  Diagnosis Date  . Atrial fibrillation (HCC)    post-op a-fib. 3/11. post Aortobifem , /  Atrial fibrillation from nebulizer treatment  May, 2012, while hospitalized  . CAD (coronary artery disease), native coronary artery    catheterization 06/2009.Marland KitchenMarland Kitchen70% proximal LAD and 30% proximal RCA. normal LV function, no intervention planned prior to surgery for aorta. LV normal function  by catheter 8/10.   . Cancer [facility]) 2013   Prostate Radiation  . Chronic pain syndrome   . Chronic thoracic back pain   . COPD (chronic obstructive pulmonary disease) ([place])    severe by PFTs August 2010  . DJD (degenerative joint disease)   . Dyslipidemia   . Ejection fraction    60%, echo, May, 2012, rapid atrial fib at the time  . GERD (gastroesophageal reflux disease)   . Myocardial infarction ([place]) 2011  . On home O2    2L N/C  . Peripheral vascular disease, unspecified ([place])    bilateral intermittent claudication  . Pneumothorax     2011  before aortobifemoral surgery,  resolved  . Renal artery stenosis ([place])     presumably corrected at time of aortobifem surgery March, 2011  . 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 Tobacco user     stop smoking    Patient Active Problem List   Diagnosis Date Noted  . Atypical chest pain 07/24/2016  . Chest pain 07/24/2016  . Pulmonary nodules 06/04/2016  . Malnutrition of moderate degree 05/13/2016  . SOB (shortness of breath) 05/10/2016  . Aspiration into airway   . Altered mental status 04/23/2016  . Dyspnea   . Hyponatremia 04/22/2016  . Noninfectious gastroenteritis and colitis 04/22/2016  . Protein-calorie malnutrition, severe 04/22/2016  . Atrial fibrillation with RVR ([place]) 04/21/2016  . Abdominal distention   . Elevated troponin 04/08/2016  . HCAP (healthcare-associated pneumonia) 04/06/2016  . COPD with acute exacerbation ([place]) 01/08/2016  . Paroxysmal a-fib ([place]) 01/08/2016  . Atrial fibrillation with rapid ventricular response ([place]) 10/02/2015  . COPD exacerbation ([place]) 09/18/2015  .  Chronic respiratory failure ([place]) 09/18/2015  . COPD with exacerbation ([place]) 07/03/2015  . On prednisone therapy 04/14/2014  . HX: anticoagulation 04/13/2014  . Acute respiratory failure ([place]) 11/29/2013  . Leukocytosis 11/24/2013  . Sinus tachycardia ([place]) 11/22/2013  . BRBPR (bright red blood per rectum) 11/22/2013  . PAF (paroxysmal atrial fibrillation) ([place]) 11/22/2013  . GERD (gastroesophageal reflux disease)   . COPD (chronic obstructive pulmonary disease) ([place])   . Pneumothorax   . Peripheral vascular disease,  unspecified ([place])   . Dyslipidemia   . DJD (degenerative joint disease)   . S/P aortobifemoral bypass surgery   . Renal artery stenosis ([place])   . Tobacco user   . CAD (coronary artery disease), native coronary artery   . Ejection fraction     Past Surgical History:  Procedure Laterality Date  . ABDOMINAL AORTIC ANEURYSM REPAIR  01/11/2010  . INGUINAL HERNIA REPAIR     RIGHT  . PR VEIN BYPASS GRAFT,AORTO-FEM-POP         Home Medications    Prior to Admission medications   Medication Sig Start Date End Date Taking? Authorizing Provider  albuterol (PROVENTIL HFA;VENTOLIN HFA) 108 (90 BASE) MCG/ACT inhaler Inhale 2 puffs into the lungs every 4 (four) hours as needed for wheezing or shortness of breath. 09/19/15  Yes Donne [name], MD  albuterol (PROVENTIL) (2.5 MG/3ML) 0.083% nebulizer solution Take 3 mLs (2.5 mg total) by nebulization every 4 (four) hours as needed for wheezing or shortness of breath. 03/15/15  Yes [name], MD  ALPRAZolam [name]) 0.25 MG tablet Take 0.25 mg by mouth 3 (three) times daily as needed for anxiety or sleep.  12/17/15  Yes Historical Provider, MD  atorvastatin (LIPITOR) 20 MG tablet Take 1 tablet (20 mg total) by mouth daily at 6 PM. 05/01/16  Yes Hosie Poisson, MD  diltiazem (CARDIZEM CD) 240 MG 24 hr capsule Take 1 capsule (240 mg total) by mouth daily. 05/01/16  Yes Hosie Poisson, MD  ELIQUIS 5 MG TABS tablet TAKE 1 TABLET BY MOUTH TWICE A DAY 06/30/16  Yes Satira Sark, MD  furosemide (LASIX) 20 MG tablet Take 1 tablet (20 mg total) by mouth daily as needed for fluid or edema. as needed for swelling/weight gain of >2lbs overnight or 4-5lbs in 1 week 05/14/16  Yes Lavina Hamman, MD  HYDROcodone-acetaminophen (NORCO/VICODIN) 5-325 MG tablet Take 1-2 tablets by mouth every 6 (six) hours as needed for moderate pain or severe pain. 04/15/16  Yes Domenic Polite, MD  insulin aspart (NOVOLOG) 100 UNIT/ML FlexPen Inject into the skin. Sliding scale.   Yes  Historical Provider, MD  ipratropium (ATROVENT) 0.02 % nebulizer solution Take 0.5 mg by nebulization every 4 (four) hours as needed for wheezing or shortness of breath.   Yes Historical Provider, MD  nitroGLYCERIN (NITROSTAT) 0.4 MG SL tablet Place 1 tablet (0.4 mg total) under the tongue every 5 (five) minutes x 3 doses as needed for chest pain. 06/11/15  Yes [name], MD  omeprazole (PRILOSEC) 20 MG capsule Take 20 mg by mouth every morning.  04/21/11  Yes Historical Provider, MD  predniSONE (DELTASONE) 10 MG tablet Take 10 mg by mouth daily.   Yes Historical Provider, MD  gabapentin (NEURONTIN) 300 MG capsule Take 300 mg by mouth 3 (three) times daily.    Historical Provider, MD  metFORMIN (GLUCOPHAGE) 1000 MG tablet Take 1,000 mg by mouth daily with breakfast.    Historical Provider, MD  Multiple Vitamin (MULTIVITAMIN WITH MINERALS) TABS  tablet Take 1 tablet by mouth daily. Patient not taking: Reported on 07/23/2016 05/01/16   Hosie Poisson, MD    Family History Family History  Problem Relation Age of Onset  . Cancer Father     prostate  . Heart attack Father     Social History Social History  Substance Use Topics  . Smoking status: Former Smoker    Packs/day: 2.00    Years: 40.00    Types: Cigarettes    Quit date: 01/11/2010  . Smokeless tobacco: Never Used  . Alcohol use No     Allergies   Levaquin [levofloxacin in d5w]   Review of Systems Review of Systems  Constitutional: Negative for chills, fatigue and fever.  HENT: Negative for congestion and sore throat.   Eyes: Negative for visual disturbance.  Respiratory: Positive for cough and shortness of breath. Negative for chest tightness.   Cardiovascular: Positive for chest pain. Negative for palpitations.  Gastrointestinal: Negative for abdominal pain, blood in stool, diarrhea, nausea and vomiting.  Genitourinary: Negative for decreased urine volume and difficulty urinating.  Musculoskeletal: Negative for back pain  and neck stiffness.  Skin: Negative for rash.  Neurological: Negative for light-headedness and headaches.  Psychiatric/Behavioral: Negative for confusion.  All other systems reviewed and are negative.    Physical Exam Updated Vital Signs BP (!) 115/104 (BP Location: Left Arm)   Pulse 109   Temp 98 F (36.7 C) (Oral)   Resp 20   Ht 5\' 4"  (1.626 m)   Wt 114 lb (51.7 kg)   SpO2 100%   BMI 19.57 kg/m   Physical Exam  Constitutional: He is oriented to person, place, and time. He appears well-developed and well-nourished. No distress.  HENT:  Head: Normocephalic and atraumatic.  Nose: Nose normal.  Eyes: Conjunctivae and EOM are normal. Pupils are equal, round, and reactive to light. Right eye exhibits no discharge. Left eye exhibits no discharge. No scleral icterus.  Neck: Normal range of motion. Neck supple.  Cardiovascular: Regular rhythm.  Exam reveals no gallop and no friction rub.   No murmur heard. Tachycardic  Pulmonary/Chest: Effort normal and breath sounds normal. No stridor. No respiratory distress. He has no wheezes. He has no rales.  Coarse lung sounds throughout, transmitted from upper airway. Good air movement.  Abdominal: Soft. He exhibits no distension. There is no tenderness.  Musculoskeletal: He exhibits edema. He exhibits no tenderness.  Mild non-pitting edema both feet.  Neurological: He is alert and oriented to person, place, and time.  Skin: Skin is warm and dry. No rash noted. He is not diaphoretic. No erythema.  Psychiatric: He has a normal mood and affect.  Vitals reviewed.   ED Treatments / Results  DIAGNOSTIC STUDIES: Oxygen Saturation is 100% on [place], normal by my interpretation.  COORDINATION OF CARE: 8:44 PM-Will order imaging and blood work results. Discussed treatment plan with pt at bedside and pt agreed to plan. '  Labs (all labs ordered are listed, but only abnormal results are displayed) Labs Reviewed  BASIC METABOLIC PANEL - Abnormal;  Notable for the following:       Result Value   Potassium 3.1 (*)    Glucose, Bld 142 (*)    Creatinine, Ser 0.47 (*)    Calcium 8.5 (*)    All other components within normal limits  CBC - Abnormal; Notable for the following:    RBC 3.90 (*)    Hemoglobin 10.3 (*)    HCT 34.6 (*)  MCHC 29.8 (*)    RDW 15.7 (*)    All other components within normal limits  TROPONIN I - Abnormal; Notable for the following:    Troponin I 0.04 (*)    All other components within normal limits    EKG  EKG Interpretation  Date/Time:  Wednesday July 23 2016 19:08:50 EDT Ventricular Rate:  118 PR Interval:  124 QRS Duration: 80 QT Interval:  338 QTC Calculation: 473 R Axis:   4 Text Interpretation:  Sinus tachycardia with Premature atrial complexes Otherwise normal ECG Confirmed by [facility] MD, Kayle Passarelli ([id number]) on 07/23/2016 7:11:49 PM       Radiology Dg Chest 2 View  Result Date: 07/23/2016 CLINICAL DATA:  Chest pain, shortness of breath and dizziness for 2 hours. EXAM: CHEST  2 VIEW COMPARISON:  Radiographs 05/10/2016.  Chest CT 06/27/2016 FINDINGS: Hyperinflation with advanced emphysema. Irregular streaky opacity in the right midlung zone at site of nodule on CT. There is biapical pleural parenchymal scarring. Ill-defined patchy opacity in the right midlung zone likely localizes to the middle lobe on the lateral view. There is no pleural effusion. No pneumothorax. Compression deformities in the midthoracic spine are unchanged from CT. IMPRESSION: Ill-defined patchy opacity in the right midlung zone, likely in the middle lobe, concerning for infection/pneumonia. Irregular streaky opacity superiorly in the right midlung zone at site of irregular nodularity on prior CT. Electronically Signed   By: Jeb Levering M.D.   On: 07/23/2016 20:00    Procedures Procedures (including critical care time)  Medications Ordered in ED Medications  nitroGLYCERIN (NITROSTAT) SL tablet 0.4 mg (0.4 mg  Sublingual Given 07/23/16 2310)  levalbuterol (XOPENEX) nebulizer solution 0.63 mg (0.63 mg Nebulization Not Given 07/23/16 2351)  potassium chloride SA (K-DUR,KLOR-CON) CR tablet 40 mEq (not administered)  predniSONE (DELTASONE) tablet 20 mg (not administered)  levalbuterol (XOPENEX) nebulizer solution 0.63 mg (not administered)  levalbuterol (XOPENEX) nebulizer solution 0.63 mg (0.63 mg Nebulization Given 07/23/16 2314)  predniSONE (DELTASONE) tablet 40 mg (40 mg Oral Given 07/24/16 0000)  azithromycin (ZITHROMAX) tablet 500 mg (500 mg Oral Given 07/24/16 0000)     Initial Impression / Assessment and Plan / ED Course  I have reviewed the triage vital signs and the nursing notes.  Pertinent labs & imaging results that were available during my care of the patient were reviewed by me and considered in my medical decision making (see chart for details).  Clinical Course    Atypical chest pain that, per patient, is not similar to his prior MIs. States that this is likely secondary to A. Fib. Pain seems to be improving per patient. EKG with sinus tachycardia and no new ischemic changes. Initial troponin positive at 0.04. On review of old records this is below the patient's baseline troponin levels. Likely secondary to strain. Chest x-ray with new right middle lobe opacity. Given patient's productive cough and history of COPD, is concerning for developing pneumonia. Patient given a breathing treatment, steroids and azithromycin.  Discussed case with hospitalist who evaluated the patient in the emergency department and will admit to trend troponins and continue treatment for COPD exacerbation secondary to pneumonia.    Final Clinical Impressions(s) / ED Diagnoses   Final diagnoses:  Chest pain, unspecified chest pain type  Elevated troponin  Cough  COPD exacerbation (HCC)    Disposition: Admit  Condition: stable  I personally performed the services described in this documentation, which  was scribed in my presence. The recorded information has been reviewed and  is accurate.         Fatima Blank, MD 07/24/16 [phone]

## 2016-07-23 NOTE — ED Triage Notes (Signed)
Pt reports centralized chest pain that started a couple of hours ago. Pt on O2 at 3L.. Congested cough heard.

## 2016-07-24 DIAGNOSIS — J189 Pneumonia, unspecified organism: Secondary | ICD-10-CM | POA: Diagnosis present

## 2016-07-24 DIAGNOSIS — J9611 Chronic respiratory failure with hypoxia: Secondary | ICD-10-CM | POA: Diagnosis present

## 2016-07-24 DIAGNOSIS — G894 Chronic pain syndrome: Secondary | ICD-10-CM | POA: Diagnosis present

## 2016-07-24 DIAGNOSIS — J441 Chronic obstructive pulmonary disease with (acute) exacerbation: Secondary | ICD-10-CM | POA: Diagnosis present

## 2016-07-24 DIAGNOSIS — E875 Hyperkalemia: Secondary | ICD-10-CM | POA: Diagnosis present

## 2016-07-24 DIAGNOSIS — D509 Iron deficiency anemia, unspecified: Secondary | ICD-10-CM | POA: Diagnosis present

## 2016-07-24 DIAGNOSIS — E1151 Type 2 diabetes mellitus with diabetic peripheral angiopathy without gangrene: Secondary | ICD-10-CM | POA: Diagnosis present

## 2016-07-24 DIAGNOSIS — E876 Hypokalemia: Secondary | ICD-10-CM | POA: Diagnosis present

## 2016-07-24 DIAGNOSIS — R0789 Other chest pain: Secondary | ICD-10-CM | POA: Diagnosis not present

## 2016-07-24 DIAGNOSIS — J440 Chronic obstructive pulmonary disease with acute lower respiratory infection: Secondary | ICD-10-CM | POA: Diagnosis present

## 2016-07-24 DIAGNOSIS — I252 Old myocardial infarction: Secondary | ICD-10-CM | POA: Diagnosis not present

## 2016-07-24 DIAGNOSIS — Z9981 Dependence on supplemental oxygen: Secondary | ICD-10-CM | POA: Diagnosis not present

## 2016-07-24 DIAGNOSIS — D6489 Other specified anemias: Secondary | ICD-10-CM | POA: Diagnosis not present

## 2016-07-24 DIAGNOSIS — R7989 Other specified abnormal findings of blood chemistry: Secondary | ICD-10-CM

## 2016-07-24 DIAGNOSIS — Z794 Long term (current) use of insulin: Secondary | ICD-10-CM | POA: Diagnosis not present

## 2016-07-24 DIAGNOSIS — E785 Hyperlipidemia, unspecified: Secondary | ICD-10-CM | POA: Diagnosis present

## 2016-07-24 DIAGNOSIS — I2510 Atherosclerotic heart disease of native coronary artery without angina pectoris: Secondary | ICD-10-CM | POA: Diagnosis present

## 2016-07-24 DIAGNOSIS — Z87891 Personal history of nicotine dependence: Secondary | ICD-10-CM | POA: Diagnosis not present

## 2016-07-24 DIAGNOSIS — Z7901 Long term (current) use of anticoagulants: Secondary | ICD-10-CM | POA: Diagnosis not present

## 2016-07-24 DIAGNOSIS — I4891 Unspecified atrial fibrillation: Secondary | ICD-10-CM

## 2016-07-24 DIAGNOSIS — R079 Chest pain, unspecified: Secondary | ICD-10-CM | POA: Diagnosis present

## 2016-07-24 DIAGNOSIS — Z7952 Long term (current) use of systemic steroids: Secondary | ICD-10-CM | POA: Diagnosis not present

## 2016-07-24 DIAGNOSIS — I480 Paroxysmal atrial fibrillation: Secondary | ICD-10-CM | POA: Diagnosis present

## 2016-07-24 DIAGNOSIS — I482 Chronic atrial fibrillation: Secondary | ICD-10-CM | POA: Diagnosis present

## 2016-07-24 DIAGNOSIS — Z8249 Family history of ischemic heart disease and other diseases of the circulatory system: Secondary | ICD-10-CM | POA: Diagnosis not present

## 2016-07-24 DIAGNOSIS — E118 Type 2 diabetes mellitus with unspecified complications: Secondary | ICD-10-CM | POA: Diagnosis present

## 2016-07-24 DIAGNOSIS — K219 Gastro-esophageal reflux disease without esophagitis: Secondary | ICD-10-CM | POA: Diagnosis present

## 2016-07-24 LAB — CBC
HEMATOCRIT: 32.3 % — AB (ref 39.0–52.0)
HEMOGLOBIN: 9.7 g/dL — AB (ref 13.0–17.0)
MCH: 26.6 pg (ref 26.0–34.0)
MCHC: 30 g/dL (ref 30.0–36.0)
MCV: 88.7 fL (ref 78.0–100.0)
Platelets: 363 10*3/uL (ref 150–400)
RBC: 3.64 MIL/uL — ABNORMAL LOW (ref 4.22–5.81)
RDW: 15.7 % — AB (ref 11.5–15.5)
WBC: 5.4 10*3/uL (ref 4.0–10.5)

## 2016-07-24 LAB — TSH: TSH: 0.466 u[IU]/mL (ref 0.350–4.500)

## 2016-07-24 LAB — BASIC METABOLIC PANEL
Anion gap: 15 (ref 5–15)
BUN: 8 mg/dL (ref 6–20)
CHLORIDE: 97 mmol/L — AB (ref 101–111)
CO2: 31 mmol/L (ref 22–32)
CREATININE: 0.39 mg/dL — AB (ref 0.61–1.24)
Calcium: 8.3 mg/dL — ABNORMAL LOW (ref 8.9–10.3)
GFR calc Af Amer: >60 mL/min (ref >60)
GFR calc non Af Amer: >60 mL/min (ref >60)
GLUCOSE: 210 mg/dL — AB (ref 65–99)
Potassium: 3.7 mmol/L (ref 3.5–5.1)
SODIUM: 143 mmol/L (ref 135–145)

## 2016-07-24 LAB — FERRITIN: Ferritin: 127 ng/mL (ref 24–336)

## 2016-07-24 LAB — TROPONIN I: Troponin I: 0.04 ng/mL (ref <0.03)

## 2016-07-24 LAB — IRON AND TIBC
Iron: 22 ug/dL — ABNORMAL LOW (ref 45–182)
Saturation Ratios: 7 % — ABNORMAL LOW (ref 17.9–39.5)
TIBC: 337 ug/dL (ref 250–450)
UIBC: 315 ug/dL

## 2016-07-24 LAB — MRSA PCR SCREENING: MRSA by PCR: NEGATIVE

## 2016-07-24 LAB — VITAMIN B12: VITAMIN B 12: 340 pg/mL (ref 180–914)

## 2016-07-24 LAB — GLUCOSE, CAPILLARY
GLUCOSE-CAPILLARY: 126 mg/dL — AB (ref 65–99)
GLUCOSE-CAPILLARY: 111 mg/dL — AB (ref 65–99)
GLUCOSE-CAPILLARY: 131 mg/dL — AB (ref 65–99)
Glucose-Capillary: 107 mg/dL — ABNORMAL HIGH (ref 65–99)

## 2016-07-24 MED ORDER — ONDANSETRON HCL 4 MG PO TABS: 4.0000 mg | ORAL_TABLET | Freq: Four times a day (QID) | ORAL | Status: DC | PRN

## 2016-07-24 MED ORDER — DEXTROSE 5 % IV SOLN
500.0000 mg | INTRAVENOUS | Status: DC
  Administered 2016-07-24 – 2016-07-26 (×3): 500 mg via INTRAVENOUS
  Filled 2016-07-24 (×4): qty 500

## 2016-07-24 MED ORDER — ASPIRIN EC 81 MG PO TBEC
81.0000 mg | DELAYED_RELEASE_TABLET | Freq: Every day | ORAL | Status: DC
  Filled 2016-07-24 (×3): qty 1

## 2016-07-24 MED ORDER — IPRATROPIUM BROMIDE 0.02 % IN SOLN
0.5000 mg | RESPIRATORY_TRACT | Status: DC | PRN
Start: 2016-07-24 — End: 2016-07-24

## 2016-07-24 MED ORDER — PANTOPRAZOLE SODIUM 40 MG PO TBEC
40.0000 mg | DELAYED_RELEASE_TABLET | Freq: Every day | ORAL | Status: DC
  Administered 2016-07-24 – 2016-07-27 (×4): 40 mg via ORAL
  Filled 2016-07-24 (×4): qty 1

## 2016-07-24 MED ORDER — NITROGLYCERIN 0.4 MG SL SUBL: 0.4000 mg | SUBLINGUAL_TABLET | SUBLINGUAL | Status: DC | PRN

## 2016-07-24 MED ORDER — ALPRAZOLAM 0.25 MG PO TABS
0.2500 mg | ORAL_TABLET | Freq: Three times a day (TID) | ORAL | Status: DC | PRN
  Administered 2016-07-24 – 2016-07-27 (×9): 0.25 mg via ORAL
  Filled 2016-07-24 (×9): qty 1

## 2016-07-24 MED ORDER — FUROSEMIDE 20 MG PO TABS: 20.0000 mg | ORAL_TABLET | Freq: Every day | ORAL | Status: DC | PRN

## 2016-07-24 MED ORDER — PREDNISONE 20 MG PO TABS
20.0000 mg | ORAL_TABLET | Freq: Two times a day (BID) | ORAL | Status: DC
  Administered 2016-07-25 – 2016-07-27 (×5): 20 mg via ORAL
  Filled 2016-07-24 (×5): qty 1

## 2016-07-24 MED ORDER — IPRATROPIUM-ALBUTEROL 0.5-2.5 (3) MG/3ML IN SOLN
3.0000 mL | RESPIRATORY_TRACT | Status: DC | PRN
  Filled 2016-07-24: qty 3

## 2016-07-24 MED ORDER — ALBUTEROL SULFATE (2.5 MG/3ML) 0.083% IN NEBU: 3.0000 mL | INHALATION_SOLUTION | RESPIRATORY_TRACT | Status: DC | PRN

## 2016-07-24 MED ORDER — INSULIN ASPART 100 UNIT/ML ~~LOC~~ SOLN: 0.0000 [IU] | Freq: Every day | SUBCUTANEOUS | Status: DC

## 2016-07-24 MED ORDER — DEXTROSE 5 % IV SOLN
1.0000 g | INTRAVENOUS | Status: DC
  Administered 2016-07-24 – 2016-07-27 (×4): 1 g via INTRAVENOUS
  Filled 2016-07-24 (×6): qty 10

## 2016-07-24 MED ORDER — IPRATROPIUM BROMIDE 0.02 % IN SOLN
0.5000 mg | Freq: Four times a day (QID) | RESPIRATORY_TRACT | Status: DC
  Administered 2016-07-24 – 2016-07-25 (×8): 0.5 mg via RESPIRATORY_TRACT
  Filled 2016-07-24 (×8): qty 2.5

## 2016-07-24 MED ORDER — BENZONATATE 100 MG PO CAPS
100.0000 mg | ORAL_CAPSULE | Freq: Three times a day (TID) | ORAL | Status: DC
  Administered 2016-07-24 – 2016-07-27 (×10): 100 mg via ORAL
  Filled 2016-07-24 (×11): qty 1

## 2016-07-24 MED ORDER — ATORVASTATIN CALCIUM 20 MG PO TABS
20.0000 mg | ORAL_TABLET | Freq: Every day | ORAL | Status: DC
  Administered 2016-07-24 – 2016-07-26 (×3): 20 mg via ORAL
  Filled 2016-07-24 (×3): qty 1

## 2016-07-24 MED ORDER — LEVALBUTEROL HCL 0.63 MG/3ML IN NEBU
0.6300 mg | INHALATION_SOLUTION | Freq: Four times a day (QID) | RESPIRATORY_TRACT | Status: DC
  Administered 2016-07-24 – 2016-07-25 (×9): 0.63 mg via RESPIRATORY_TRACT
  Filled 2016-07-24 (×8): qty 3

## 2016-07-24 MED ORDER — GABAPENTIN 300 MG PO CAPS
300.0000 mg | ORAL_CAPSULE | Freq: Three times a day (TID) | ORAL | Status: DC
  Administered 2016-07-24 – 2016-07-27 (×10): 300 mg via ORAL
  Filled 2016-07-24 (×10): qty 1

## 2016-07-24 MED ORDER — LEVALBUTEROL HCL 0.63 MG/3ML IN NEBU: 0.6300 mg | INHALATION_SOLUTION | Freq: Once | RESPIRATORY_TRACT | Status: DC

## 2016-07-24 MED ORDER — HYDROCODONE-ACETAMINOPHEN 5-325 MG PO TABS
1.0000 | ORAL_TABLET | Freq: Four times a day (QID) | ORAL | Status: DC | PRN
  Administered 2016-07-24 – 2016-07-25 (×4): 1 via ORAL
  Administered 2016-07-25: 2 via ORAL
  Administered 2016-07-26 (×2): 1 via ORAL
  Administered 2016-07-27: 2 via ORAL
  Administered 2016-07-27: 1 via ORAL
  Filled 2016-07-24 (×2): qty 1
  Filled 2016-07-24 (×4): qty 2
  Filled 2016-07-24 (×2): qty 1

## 2016-07-24 MED ORDER — ONDANSETRON HCL 4 MG/2ML IJ SOLN: 4.0000 mg | Freq: Four times a day (QID) | INTRAMUSCULAR | Status: DC | PRN

## 2016-07-24 MED ORDER — INSULIN ASPART 100 UNIT/ML ~~LOC~~ SOLN: 4.0000 [IU] | Freq: Three times a day (TID) | SUBCUTANEOUS | Status: DC

## 2016-07-24 MED ORDER — LEVALBUTEROL HCL 0.63 MG/3ML IN NEBU
INHALATION_SOLUTION | RESPIRATORY_TRACT | Status: AC
  Administered 2016-07-24: 0.63 mg via RESPIRATORY_TRACT
  Filled 2016-07-24: qty 3

## 2016-07-24 MED ORDER — APIXABAN 5 MG PO TABS
5.0000 mg | ORAL_TABLET | Freq: Two times a day (BID) | ORAL | Status: DC
  Administered 2016-07-24 – 2016-07-27 (×7): 5 mg via ORAL
  Filled 2016-07-24 (×7): qty 1

## 2016-07-24 MED ORDER — INSULIN ASPART 100 UNIT/ML ~~LOC~~ SOLN
0.0000 [IU] | Freq: Three times a day (TID) | SUBCUTANEOUS | Status: DC
  Administered 2016-07-24 (×2): 2 [IU] via SUBCUTANEOUS

## 2016-07-24 MED ORDER — METFORMIN HCL 500 MG PO TABS
1000.0000 mg | ORAL_TABLET | Freq: Every day | ORAL | Status: DC
  Administered 2016-07-24 – 2016-07-27 (×4): 1000 mg via ORAL
  Filled 2016-07-24 (×4): qty 2

## 2016-07-24 MED ORDER — POTASSIUM CHLORIDE CRYS ER 20 MEQ PO TBCR
40.0000 meq | EXTENDED_RELEASE_TABLET | Freq: Every day | ORAL | Status: DC
  Administered 2016-07-24: 40 meq via ORAL
  Filled 2016-07-24 (×3): qty 2

## 2016-07-24 MED ORDER — SODIUM CHLORIDE 0.9% FLUSH
3.0000 mL | Freq: Two times a day (BID) | INTRAVENOUS | Status: DC
  Administered 2016-07-24 – 2016-07-27 (×8): 3 mL via INTRAVENOUS

## 2016-07-24 MED ORDER — DEXTROSE 5 % IV SOLN
500.0000 mg | INTRAVENOUS | Status: DC
  Filled 2016-07-24: qty 500

## 2016-07-24 MED ORDER — LEVALBUTEROL HCL 0.63 MG/3ML IN NEBU: 0.6300 mg | INHALATION_SOLUTION | Freq: Three times a day (TID) | RESPIRATORY_TRACT | Status: DC

## 2016-07-24 MED ORDER — PREDNISONE 10 MG PO TABS
10.0000 mg | ORAL_TABLET | Freq: Every day | ORAL | Status: DC
  Administered 2016-07-24 – 2016-07-25 (×2): 10 mg via ORAL
  Filled 2016-07-24 (×2): qty 1

## 2016-07-24 MED ORDER — DILTIAZEM HCL ER COATED BEADS 240 MG PO CP24
240.0000 mg | ORAL_CAPSULE | Freq: Every day | ORAL | Status: DC
  Administered 2016-07-24 – 2016-07-27 (×4): 240 mg via ORAL
  Filled 2016-07-24 (×4): qty 1

## 2016-07-24 NOTE — Progress Notes (Signed)
PROGRESS NOTE    Kenneth Merritt  [id number] DOB: 18-Jul-1951 DOA: 07/23/2016 PCP: Neale Burly, MD    Brief Narrative:  Patient is a 65 year old man with a history of A. fib on Eliquis, PAD, status post aortobifemoral bypass surgery, CAD, oxygen dependent COPD, and GERD, who presented to the ED on 07/23/16 with a chief complaint of abdominal pain. In the ED, he was afebrile and tachycardic, with a normal blood pressure. His lab data were significant for potassium of 3.1, glucose of 142, normal WBC, hemoglobin of 10.3, troponin I of 0.04, chest x-ray that revealed ill-defined patchy opacity in the right midlung zone, concerning for pneumonia. EKG revealed tachycardia and multiple premature complexes. He was admitted for further evaluation and management.   Assessment & Plan:   Principal Problem:   Atypical chest pain Active Problems:   COPD with exacerbation (HCC)   Elevated troponin   Atrial fibrillation with RVR (HCC)   CAP (community acquired pneumonia)   Chronic respiratory failure with hypoxia (HCC)   Dyslipidemia   Chest pain   Hypokalemia   Anemia due to other cause     1. Atypical chest pain in a patient with CAD and marginally elevated troponin I. The patient's chest pain is was likely secondary to pneumonia and COPD exacerbation. His EKG revealed no suspicious ST or T-wave abnormality, although there was some tachycardia which was likely from nebulizers and some respiratory distress. -His troponin I elevation is marginal and has remained flat, not consistent with ACS. -We'll continue to treat him supportively. -We'll continue his cardiac medications including Lipitor, Cardizem, Eliquis, furosemide.  Community-acquired pneumonia with superimposed COPD exacerbation. Patient's chest x-ray noted on admission. 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 -Patient was continued on oxygen. Antibiotic therapy was started with  azithromycin. Rocephin was added. -He was started on a higher dose of prednisone and Atrovent/Xopenex nebulizer for treatment of COPD exacerbation. -We'll add Tessalon Perles for cough.  Chronic atrial fibrillation with RVR. Patient is treated chronically with Cardizem and Eliquis. His heart rate was elevated on admission, likely from pneumonia and COPD exacerbation. Will continue Cardizem and Eliquis. -His heart rate is improving. -His TSH is within normal limits.  Type 2 diabetes mellitus with peripheral vascular disease. Patient is treated chronically with metformin and sliding scale NovoLog.. Metformin was held on admission. He was started on sliding scale NovoLog. This will be continued for now.  Normocytic anemia. Patient's hemoglobin was 10.3 on admission and has trended down to 9.7 without evidence of GI or GU bleeding. -We'll order some anemia studies. We'll continue his PPI.  Hypokalemia. Patient's serum potassium was 3.1 on admission. Will continue potassium chloride supplementation started.    DVT prophylaxis: Eliquis Code Status: Full code Family Communication: Family not available Disposition Plan: Discharge to home with clinically appropriate   Consultants:   None  Procedures:   None  Antimicrobials:   Rocephin 9/14>>  Azithromycin 9/13 >>   Subjective: Patient denies chest pain. He reports chest congestion.  Objective: Vitals:   07/24/16 0600 07/24/16 0700 07/24/16 0800 07/24/16 0838  BP: 101/65 99/70 (!) 123/95   Pulse: 75 85 (!) 110   Resp: 15 16 14    Temp:      TempSrc:      SpO2: 100% 100% 100% 98%  Weight:      Height:        Intake/Output Summary (Last 24 hours) at 07/24/16 [id number] Last data filed  at 07/24/16 0828  Gross per 24 hour  Intake              120 ml  Output                0 ml  Net              120 ml   Filed Weights   07/23/16 1902 07/24/16 0341 07/24/16 0500  Weight: 51.7 kg (114 lb) 51.5 kg (113 lb 8.6 oz) 51.5 kg (113  lb 8.6 oz)    Examination:  General exam: Appears calm and comfortable  Respiratory system: Bilateral crackles and occasional wheeze. Respiratory effort normal. Cardiovascular system: Irregular, irregular, with borderline tachycardia. No JVD. No pedal edema. Gastrointestinal system: Abdomen is nondistended, soft and nontender. No organomegaly or masses felt. Normal bowel sounds heard. Central nervous system: Alert and oriented. No focal neurological deficits. Extremities: Symmetric 5 x 5 power. Skin: No rashes, lesions or ulcers Psychiatry: Judgement and insight appear normal. Mood & affect appropriate.     Data Reviewed: I have personally reviewed following labs and imaging studies  CBC:  Recent Labs Lab 07/23/16 2013 07/24/16 0854  WBC 8.7 5.4  HGB 10.3* 9.7*  HCT 34.6* 32.3*  MCV 88.7 88.7  PLT 399 [id number]   Basic Metabolic Panel:  Recent Labs Lab 07/23/16 2013  NA 143  K 3.1*  CL 101  CO2 31  GLUCOSE 142*  BUN 8  CREATININE 0.47*  CALCIUM 8.5*   GFR: Estimated Creatinine Clearance: 68 mL/min (by C-G formula based on SCr of 0.47 mg/dL (L)). Liver Function Tests: No results for input(s): AST, ALT, ALKPHOS, BILITOT, PROT, ALBUMIN in the last 168 hours. No results for input(s): LIPASE, AMYLASE in the last 168 hours. No results for input(s): AMMONIA in the last 168 hours. Coagulation Profile: No results for input(s): INR, PROTIME in the last 168 hours. Cardiac Enzymes:  Recent Labs Lab 07/23/16 2013 07/24/16 0340  TROPONINI 0.04* 0.04*   BNP (last 3 results) No results for input(s): PROBNP in the last 8760 hours. HbA1C: No results for input(s): HGBA1C in the last 72 hours. CBG:  Recent Labs Lab 07/24/16 0813  GLUCAP 126*   Lipid Profile: No results for input(s): CHOL, HDL, LDLCALC, TRIG, CHOLHDL, LDLDIRECT in the last 72 hours. Thyroid Function Tests:  Recent Labs  07/24/16 0340  TSH 0.466   Anemia Panel: No results for input(s): VITAMINB12,  FOLATE, FERRITIN, TIBC, IRON, RETICCTPCT in the last 72 hours. Sepsis Labs: No results for input(s): PROCALCITON, LATICACIDVEN in the last 168 hours.  Recent Results (from the past 240 hour(s))  MRSA PCR Screening     Status: None   Collection Time: 07/24/16  3:52 AM  Result Value Ref Range Status   MRSA by PCR NEGATIVE NEGATIVE Final    Comment:        The GeneXpert MRSA Assay (FDA approved for NASAL specimens only), is one component of a comprehensive MRSA colonization surveillance program. It is not intended to diagnose MRSA infection nor to guide or monitor treatment for MRSA infections.          Radiology Studies: Dg Chest 2 View  Result Date: 07/23/2016 CLINICAL DATA:  Chest pain, shortness of breath and dizziness for 2 hours. EXAM: CHEST  2 VIEW COMPARISON:  Radiographs 05/10/2016.  Chest CT 06/27/2016 FINDINGS: Hyperinflation with advanced emphysema. Irregular streaky opacity in the right midlung zone at site of nodule on CT. There is biapical pleural parenchymal scarring. Ill-defined patchy  opacity in the right midlung zone likely localizes to the middle lobe on the lateral view. There is no pleural effusion. No pneumothorax. Compression deformities in the midthoracic spine are unchanged from CT. IMPRESSION: Ill-defined patchy opacity in the right midlung zone, likely in the middle lobe, concerning for infection/pneumonia. Irregular streaky opacity superiorly in the right midlung zone at site of irregular nodularity on prior CT. Electronically Signed   By: Jeb Levering M.D.   On: 07/23/2016 20:00        Scheduled Meds: . apixaban  5 mg Oral BID  . aspirin EC  81 mg Oral Daily  . atorvastatin  20 mg Oral q1800  . [START ON 07/25/2016] azithromycin  500 mg Intravenous Q24H  . cefTRIAXone (ROCEPHIN)  IV  1 g Intravenous Q24H  . diltiazem  240 mg Oral Daily  . gabapentin  300 mg Oral TID  . insulin aspart  0-15 Units Subcutaneous TID WC  . insulin aspart  0-5  Units Subcutaneous QHS  . insulin aspart  4 Units Subcutaneous TID WC  . ipratropium  0.5 mg Nebulization QID  . levalbuterol  0.63 mg Nebulization QID  . metFORMIN  1,000 mg Oral Q breakfast  . pantoprazole  40 mg Oral Daily  . potassium chloride  40 mEq Oral Daily  . predniSONE  10 mg Oral Daily  . [START ON 07/25/2016] predniSONE  20 mg Oral BID WC  . sodium chloride flush  3 mL Intravenous Q12H   Continuous Infusions:    LOS: 0 days    Time spent: 106 minutes    Rexene Alberts, MD [facility] Pager [phone]  If 7PM-7AM, please contact night-coverage [email or web address] Password [email or web address] 07/24/2016, 9:25 AM

## 2016-07-24 NOTE — ED Notes (Signed)
Pt assisted to bathroom by wheelchair.

## 2016-07-24 NOTE — Care Management Note (Signed)
Case Management Note  Patient Details  Name: Kenneth Merritt MRN: [id number] Date of Birth: 1951/04/13  Subjective/Objective:                  Pt admitted with COPD, ?pneumonia. He is from home, lives with his wife and is mostly ind with ADL's. He has a cane at the bedside he uses for ambulations. He has home O2 and neb machine at home PTA. He has used [facility] in the past for [facility] services but is not active now and would like to address needing HH closer to DC. He voices concern about his Medicaid and this was dicussed with the [facility] who will f/u with pt this hospitalization. Pt also asking about his need to metformin and insulin at DC as he has not needed to take this medications since his last hospitalization in July. Pt told that all medications needed at DC would be addressed closer to his DC date.   Action/Plan: Will cont to follow. May need [facility] nursing at DC.   Expected Discharge Date:    07/26/2016              Expected Discharge Plan:  Home/Self Care  In-House Referral:  Financial Counselor  Discharge planning Services  CM Consult  Post Acute Care Choice:  NA Choice offered to:  NA  DME Arranged:    DME Agency:     HH Arranged:    HH Agency:     Status of Service:  In process, will continue to follow  If discussed at Long Length of Stay Meetings, dates discussed:    Additional Comments:  Sherald Barge, RN 07/24/2016, 2:32 PM

## 2016-07-24 NOTE — ED Notes (Signed)
Report given to ICU RN

## 2016-07-25 LAB — GLUCOSE, CAPILLARY
GLUCOSE-CAPILLARY: 100 mg/dL — AB (ref 65–99)
Glucose-Capillary: 90 mg/dL (ref 65–99)
Glucose-Capillary: 128 mg/dL — ABNORMAL HIGH (ref 65–99)
Glucose-Capillary: 135 mg/dL — ABNORMAL HIGH (ref 65–99)

## 2016-07-25 LAB — BASIC METABOLIC PANEL
Anion gap: 9 (ref 5–15)
BUN: 9 mg/dL (ref 6–20)
CHLORIDE: 102 mmol/L (ref 101–111)
CO2: 32 mmol/L (ref 22–32)
CREATININE: 0.34 mg/dL — AB (ref 0.61–1.24)
Calcium: 7.8 mg/dL — ABNORMAL LOW (ref 8.9–10.3)
GFR calc non Af Amer: >60 mL/min (ref >60)
Glucose, Bld: 99 mg/dL (ref 65–99)
Potassium: 2.8 mmol/L — ABNORMAL LOW (ref 3.5–5.1)
Sodium: 143 mmol/L (ref 135–145)

## 2016-07-25 LAB — CBC
HCT: 30.6 % — ABNORMAL LOW (ref 39.0–52.0)
HEMOGLOBIN: 9.3 g/dL — AB (ref 13.0–17.0)
MCH: 26.6 pg (ref 26.0–34.0)
MCHC: 30.4 g/dL (ref 30.0–36.0)
MCV: 87.7 fL (ref 78.0–100.0)
Platelets: 365 10*3/uL (ref 150–400)
RBC: 3.49 MIL/uL — AB (ref 4.22–5.81)
RDW: 15.8 % — ABNORMAL HIGH (ref 11.5–15.5)
WBC: 9.2 10*3/uL (ref 4.0–10.5)

## 2016-07-25 LAB — MAGNESIUM: Magnesium: 1.3 mg/dL — ABNORMAL LOW (ref 1.7–2.4)

## 2016-07-25 MED ORDER — POTASSIUM CHLORIDE CRYS ER 20 MEQ PO TBCR: 40.0000 meq | EXTENDED_RELEASE_TABLET | Freq: Three times a day (TID) | ORAL | Status: DC

## 2016-07-25 MED ORDER — POTASSIUM CHLORIDE 10 MEQ/100ML IV SOLN
10.0000 meq | INTRAVENOUS | Status: AC
  Administered 2016-07-25 (×2): 10 meq via INTRAVENOUS
  Filled 2016-07-25 (×2): qty 100

## 2016-07-25 MED ORDER — POTASSIUM CHLORIDE 10 MEQ/100ML IV SOLN: 10.0000 meq | INTRAVENOUS | Status: DC

## 2016-07-25 MED ORDER — FERROUS SULFATE 325 (65 FE) MG PO TABS
325.0000 mg | ORAL_TABLET | Freq: Every day | ORAL | Status: DC
  Administered 2016-07-26 – 2016-07-27 (×2): 325 mg via ORAL
  Filled 2016-07-25 (×2): qty 1

## 2016-07-25 MED ORDER — POTASSIUM CHLORIDE CRYS ER 20 MEQ PO TBCR
40.0000 meq | EXTENDED_RELEASE_TABLET | Freq: Two times a day (BID) | ORAL | Status: DC
  Administered 2016-07-25 (×2): 40 meq via ORAL
  Filled 2016-07-25 (×3): qty 2

## 2016-07-25 MED ORDER — MAGNESIUM SULFATE 2 GM/50ML IV SOLN
2.0000 g | Freq: Once | INTRAVENOUS | Status: AC
  Administered 2016-07-25: 2 g via INTRAVENOUS
  Filled 2016-07-25: qty 50

## 2016-07-25 MED ORDER — ORAL CARE MOUTH RINSE
15.0000 mL | Freq: Two times a day (BID) | OROMUCOSAL | Status: DC
Start: 2016-07-25 — End: 2016-07-27
  Administered 2016-07-25 – 2016-07-27 (×5): 15 mL via OROMUCOSAL

## 2016-07-25 MED ORDER — POTASSIUM CHLORIDE CRYS ER 20 MEQ PO TBCR
40.0000 meq | EXTENDED_RELEASE_TABLET | Freq: Every day | ORAL | Status: DC
  Administered 2016-07-25: 40 meq via ORAL

## 2016-07-25 NOTE — Care Management Important Message (Signed)
Important Message  Patient Details  Name: Kenneth Merritt MRN: [id number] Date of Birth: June 13, 1951   Medicare Important Message Given:  Yes    Sherald Barge, RN 07/25/2016, 2:00 PM

## 2016-07-25 NOTE — Progress Notes (Signed)
Checked on patient at 23:13 patient asleep

## 2016-07-25 NOTE — Progress Notes (Addendum)
PROGRESS NOTE    Kenneth Merritt  [id number] DOB: 1951/08/10 DOA: 07/23/2016 PCP: Neale Burly, MD    Brief Narrative:  Patient is a 65 year old man with a history of A. fib on Eliquis, PAD, status post aortobifemoral bypass surgery, CAD, oxygen dependent COPD, and GERD, who presented to the ED on 07/23/16 with a chief complaint of abdominal pain. In the ED, he was afebrile and tachycardic, with a normal blood pressure. His lab data were significant for potassium of 3.1, glucose of 142, normal WBC, hemoglobin of 10.3, troponin I of 0.04, chest x-ray that revealed ill-defined patchy opacity in the right midlung zone, concerning for pneumonia. EKG revealed tachycardia and multiple premature complexes. He was admitted for further evaluation and management.   Assessment & Plan:   Principal Problem:   Atypical chest pain Active Problems:   COPD with exacerbation (HCC)   Elevated troponin   Atrial fibrillation with RVR (HCC)   CAP (community acquired pneumonia)   Chronic respiratory failure with hypoxia (HCC)   DM (diabetes mellitus) with complications (HCC)   Dyslipidemia   Chest pain   Hypokalemia   Anemia due to other cause   Hypomagnesemia     1. Atypical chest pain in a patient with CAD and marginally elevated troponin I. The patient's chest pain is/was likely secondary to pneumonia and COPD exacerbation. His EKG revealed no suspicious ST or T-wave abnormality, although there was some tachycardia which was likely from nebulizers and some respiratory distress. -His troponin I elevation was marginal and has normalized, not consistent with ACS. -We'll continue to treat him supportively. -We'll continue his cardiac medications including Lipitor, Cardizem, Eliquis, when necessary furosemide.  Community-acquired pneumonia with superimposed COPD exacerbation. Patient's chest x-ray noted on admission. 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 -Patient was continued on oxygen. Antibiotic therapy was started with azithromycin. Rocephin was added. -He was started on a higher dose of prednisone and Atrovent/Xopenex nebulizer for treatment of COPD exacerbation. Tessalon Perles or added for cough. -Patient still has some chest congestion and he was told that he was not ready for discharge yet. -Will order a follow-up chest x-ray on 9/16. -Of note, the patient reports that he has an appointment with a new pulmonologist in [place] 1 week from today.  Chronic atrial fibrillation with RVR. Patient is treated chronically with Cardizem and Eliquis. His heart rate was elevated on admission, likely from pneumonia and COPD exacerbation. Will continue Cardizem and Eliquis. -His heart rate has improved. -His TSH is within normal limits.  Type 2 diabetes mellitus with peripheral vascular disease. Patient is treated chronically with metformin and sliding scale NovoLog.. Metformin was held on admission, but was restarted. He was started on sliding scale NovoLog.  If CBGs are controlled.  Normocytic anemia. Patient's hemoglobin was 10.3 on admission and has trended down to 9.3 without evidence of GI or GU bleeding. He was continued on his PPI. Anemia studies revealed of low iron of 22, normal vitamin B12, and normal ferritin. -We'll add ferrous sulfate daily.  Hypokalemia and hypomagnesemia. Patient's serum potassium was 3.1 on admission. Potassium supplementation was started. His serum potassium trended down to 2.8. -2 runs of IV potassium ordered. Will increase oral potassium to 40 mEq twice a day. -Magnesium sulfate IV already ordered for a magnesium level I.3. -We'll continue to monitor and treat accordingly.    DVT prophylaxis: Eliquis Code Status: Full code Family Communication: Family not available Disposition Plan:  Discharge to home with clinically appropriate   Consultants:   None  Procedures:    None  Antimicrobials:   Rocephin 9/14>>  Azithromycin 9/13 >>   Subjective: Patient denies chest pain or worsening shortness of breath. He wants to know when he can be discharged.  Objective: Vitals:   07/24/16 2006 07/25/16 0741 07/25/16 0744 07/25/16 0836  BP:  130/90  (!) 120/110  Pulse:  (!) 107    Resp:  (!) 31    Temp:  (!) 96.9 F (36.1 C)    TempSrc:      SpO2: 100% 99% 97%   Weight:      Height:        Intake/Output Summary (Last 24 hours) at 07/25/16 0920 Last data filed at 07/25/16 0813  Gross per 24 hour  Intake              650 ml  Output              450 ml  Net              200 ml   Filed Weights   07/23/16 1902 07/24/16 0341 07/24/16 0500  Weight: 51.7 kg (114 lb) 51.5 kg (113 lb 8.6 oz) 51.5 kg (113 lb 8.6 oz)    Examination:  General exam: Appears calm and comfortable, But chronically debilitated appearing.  Respiratory system: Continued bilateral crackles and occasional wheeze. Respiratory effort normal. Cardiovascular system: Irregular, irregular, with borderline tachycardia. No JVD. No pedal edema. Gastrointestinal system: Abdomen is nondistended, soft and nontender. No organomegaly or masses felt. Normal bowel sounds heard. Central nervous system: Alert and oriented. No focal neurological deficits. Extremities: Symmetric 5 x 5 power. Skin: No rashes, lesions or ulcers Psychiatry: Judgement and insight appear normal. Mood & affect appropriate.     Data Reviewed: I have personally reviewed following labs and imaging studies  CBC:  Recent Labs Lab 07/23/16 2013 07/24/16 0854 07/25/16 0422  WBC 8.7 5.4 9.2  HGB 10.3* 9.7* 9.3*  HCT 34.6* 32.3* 30.6*  MCV 88.7 88.7 87.7  PLT 399 363 [id number]   Basic Metabolic Panel:  Recent Labs Lab 07/23/16 2013 07/24/16 0930 07/25/16 0422  NA 143 143 143  K 3.1* 3.7 2.8*  CL 101 97* 102  CO2 31 31 32  GLUCOSE 142* 210* 99  BUN 8 8 9   CREATININE 0.47* 0.39* 0.34*  CALCIUM 8.5* 8.3* 7.8*   MG  --   --  1.3*   GFR: Estimated Creatinine Clearance: 68 mL/min (by C-G formula based on SCr of 0.34 mg/dL (L)). Liver Function Tests: No results for input(s): AST, ALT, ALKPHOS, BILITOT, PROT, ALBUMIN in the last 168 hours. No results for input(s): LIPASE, AMYLASE in the last 168 hours. No results for input(s): AMMONIA in the last 168 hours. Coagulation Profile: No results for input(s): INR, PROTIME in the last 168 hours. Cardiac Enzymes:  Recent Labs Lab 07/23/16 2013 07/24/16 0340 07/24/16 0854 07/24/16 1519  TROPONINI 0.04* 0.04* <0.03 <0.03   BNP (last 3 results) No results for input(s): PROBNP in the last 8760 hours. HbA1C: No results for input(s): HGBA1C in the last 72 hours. CBG:  Recent Labs Lab 07/24/16 0813 07/24/16 1153 07/24/16 1625 07/24/16 2127 07/25/16 0740  GLUCAP 126* 111* 131* 107* 90   Lipid Profile: No results for input(s): CHOL, HDL, LDLCALC, TRIG, CHOLHDL, LDLDIRECT in the last 72 hours. Thyroid Function Tests:  Recent Labs  07/24/16 0340  TSH 0.466   Anemia Panel:  Recent Labs  07/24/16 0854  VITAMINB12 340  FERRITIN 127  TIBC 337  IRON 22*   Sepsis Labs: No results for input(s): PROCALCITON, LATICACIDVEN in the last 168 hours.  Recent Results (from the past 240 hour(s))  MRSA PCR Screening     Status: None   Collection Time: 07/24/16  3:52 AM  Result Value Ref Range Status   MRSA by PCR NEGATIVE NEGATIVE Final    Comment:        The GeneXpert MRSA Assay (FDA approved for NASAL specimens only), is one component of a comprehensive MRSA colonization surveillance program. It is not intended to diagnose MRSA infection nor to guide or monitor treatment for MRSA infections.          Radiology Studies: Dg Chest 2 View  Result Date: 07/23/2016 CLINICAL DATA:  Chest pain, shortness of breath and dizziness for 2 hours. EXAM: CHEST  2 VIEW COMPARISON:  Radiographs 05/10/2016.  Chest CT 06/27/2016 FINDINGS:  Hyperinflation with advanced emphysema. Irregular streaky opacity in the right midlung zone at site of nodule on CT. There is biapical pleural parenchymal scarring. Ill-defined patchy opacity in the right midlung zone likely localizes to the middle lobe on the lateral view. There is no pleural effusion. No pneumothorax. Compression deformities in the midthoracic spine are unchanged from CT. IMPRESSION: Ill-defined patchy opacity in the right midlung zone, likely in the middle lobe, concerning for infection/pneumonia. Irregular streaky opacity superiorly in the right midlung zone at site of irregular nodularity on prior CT. Electronically Signed   By: Jeb Levering M.D.   On: 07/23/2016 20:00        Scheduled Meds: . apixaban  5 mg Oral BID  . aspirin EC  81 mg Oral Daily  . atorvastatin  20 mg Oral q1800  . azithromycin  500 mg Intravenous Q24H  . benzonatate  100 mg Oral TID  . cefTRIAXone (ROCEPHIN)  IV  1 g Intravenous Q24H  . diltiazem  240 mg Oral Daily  . gabapentin  300 mg Oral TID  . insulin aspart  0-15 Units Subcutaneous TID WC  . insulin aspart  0-5 Units Subcutaneous QHS  . ipratropium  0.5 mg Nebulization QID  . levalbuterol  0.63 mg Nebulization QID  . magnesium sulfate 1 - 4 g bolus IVPB  2 g Intravenous Once  . mouth rinse  15 mL Mouth Rinse BID  . metFORMIN  1,000 mg Oral Q breakfast  . pantoprazole  40 mg Oral Daily  . potassium chloride  10 mEq Intravenous Q1 Hr x 2  . potassium chloride  40 mEq Oral Daily  . potassium chloride  40 mEq Oral BID  . predniSONE  10 mg Oral Daily  . predniSONE  20 mg Oral BID WC  . sodium chloride flush  3 mL Intravenous Q12H   Continuous Infusions:    LOS: 1 day    Time spent: 69 minutes    Rexene Alberts, MD [facility] Pager [phone]  If 7PM-7AM, please contact night-coverage [email or web address] Password [email or web address] 07/25/2016, 9:20 AM

## 2016-07-26 ENCOUNTER — Inpatient Hospital Stay (HOSPITAL_COMMUNITY): Payer: Medicare Other

## 2016-07-26 LAB — BASIC METABOLIC PANEL
Anion gap: 6 (ref 5–15)
BUN: 8 mg/dL (ref 6–20)
CHLORIDE: 105 mmol/L (ref 101–111)
CO2: 29 mmol/L (ref 22–32)
Calcium: 8.6 mg/dL — ABNORMAL LOW (ref 8.9–10.3)
Creatinine, Ser: 0.39 mg/dL — ABNORMAL LOW (ref 0.61–1.24)
GFR calc Af Amer: >60 mL/min (ref >60)
GFR calc non Af Amer: >60 mL/min (ref >60)
GLUCOSE: 100 mg/dL — AB (ref 65–99)
POTASSIUM: 5.3 mmol/L — AB (ref 3.5–5.1)
Sodium: 140 mmol/L (ref 135–145)

## 2016-07-26 LAB — GLUCOSE, CAPILLARY
GLUCOSE-CAPILLARY: 117 mg/dL — AB (ref 65–99)
Glucose-Capillary: 108 mg/dL — ABNORMAL HIGH (ref 65–99)
Glucose-Capillary: 95 mg/dL (ref 65–99)
Glucose-Capillary: 157 mg/dL — ABNORMAL HIGH (ref 65–99)

## 2016-07-26 LAB — CBC
HEMATOCRIT: 32.3 % — AB (ref 39.0–52.0)
Hemoglobin: 9.6 g/dL — ABNORMAL LOW (ref 13.0–17.0)
MCH: 26.5 pg (ref 26.0–34.0)
MCHC: 29.7 g/dL — AB (ref 30.0–36.0)
MCV: 89.2 fL (ref 78.0–100.0)
Platelets: 400 10*3/uL (ref 150–400)
RBC: 3.62 MIL/uL — ABNORMAL LOW (ref 4.22–5.81)
RDW: 15.8 % — AB (ref 11.5–15.5)
WBC: 11.3 10*3/uL — ABNORMAL HIGH (ref 4.0–10.5)

## 2016-07-26 LAB — MAGNESIUM: Magnesium: 2.1 mg/dL (ref 1.7–2.4)

## 2016-07-26 MED ORDER — FUROSEMIDE 20 MG PO TABS
20.0000 mg | ORAL_TABLET | Freq: Every day | ORAL | Status: DC
  Administered 2016-07-26 – 2016-07-27 (×2): 20 mg via ORAL
  Filled 2016-07-26 (×2): qty 1

## 2016-07-26 MED ORDER — LEVALBUTEROL HCL 0.63 MG/3ML IN NEBU
0.6300 mg | INHALATION_SOLUTION | RESPIRATORY_TRACT | Status: DC | PRN
  Administered 2016-07-26: 0.63 mg via RESPIRATORY_TRACT
  Filled 2016-07-26 (×2): qty 3

## 2016-07-26 MED ORDER — IPRATROPIUM BROMIDE 0.02 % IN SOLN
0.5000 mg | RESPIRATORY_TRACT | Status: DC
  Administered 2016-07-26 – 2016-07-27 (×8): 0.5 mg via RESPIRATORY_TRACT
  Filled 2016-07-26 (×8): qty 2.5

## 2016-07-26 MED ORDER — LEVALBUTEROL HCL 0.63 MG/3ML IN NEBU
0.6300 mg | INHALATION_SOLUTION | RESPIRATORY_TRACT | Status: DC
  Administered 2016-07-26 – 2016-07-27 (×8): 0.63 mg via RESPIRATORY_TRACT
  Filled 2016-07-26 (×8): qty 3

## 2016-07-26 NOTE — Progress Notes (Signed)
Patient awake and called for NEB.

## 2016-07-26 NOTE — Progress Notes (Signed)
Have increased patients NEBS to Q 4 / while awake, and Q 2 prn Xopenex  for exacerbation. Treatments are basically the same with availability for better coverage if needed. Patient is complaining that he may need NEBS more often. Concern is his Potassium is low and also that he probably may abuse his NEBS. Goal is no more than 4 txs a day if possible.

## 2016-07-26 NOTE — Progress Notes (Addendum)
PROGRESS NOTE    Kenneth Merritt  [id number] DOB: 03-01-1951 DOA: 07/23/2016 PCP: Neale Burly, MD    Brief Narrative:  Patient is a 65 year old man with a history of A. fib on Eliquis, PAD, status post aortobifemoral bypass surgery, CAD, oxygen dependent COPD, and GERD, who presented to the ED on 07/23/16 with a chief complaint of abdominal pain. In the ED, he was afebrile and tachycardic, with a normal blood pressure. His lab data were significant for potassium of 3.1, glucose of 142, normal WBC, hemoglobin of 10.3, troponin I of 0.04, chest x-ray that revealed ill-defined patchy opacity in the right midlung zone, concerning for pneumonia. EKG revealed tachycardia and multiple premature complexes. He was admitted for further evaluation and management.   Assessment & Plan:   Principal Problem:   Atypical chest pain Active Problems:   COPD with exacerbation (HCC)   Elevated troponin   Atrial fibrillation with RVR (HCC)   CAP (community acquired pneumonia)   Chronic respiratory failure with hypoxia (HCC)   DM (diabetes mellitus) with complications (HCC)   Dyslipidemia   Chest pain   Hypokalemia   Anemia due to other cause   Hypomagnesemia     1. Atypical chest pain in a patient with CAD and marginally elevated troponin I. The patient's chest pain is/was likely secondary to pneumonia and COPD exacerbation. His EKG revealed no suspicious ST or T-wave abnormality, although there was some tachycardia which was likely from nebulizers and some respiratory distress. -His troponin I elevation was marginal and has normalized, not consistent with ACS. -We'll continue to treat him supportively. -We'll continue his cardiac medications including Lipitor, Cardizem, Eliquis, when necessary furosemide.  Community-acquired pneumonia with superimposed COPD exacerbation. Patient's chest x-ray noted on admission. 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 -Patient was continued on oxygen. Antibiotic therapy was started with azithromycin. Rocephin was added. -He was started on a higher dose of prednisone and Atrovent/Xopenex nebulizer for treatment of COPD exacerbation. Tessalon Perles was added for cough. -Follow-up chest x-ray ordered and is pending. -He is afebrile. His white blood cell count has increased a little, but this is likely from the higher dose of prednisone. -We'll continue current management. -Of note, the patient reports that he has an appointment with a new pulmonologist in [place] 1 week from today.  Chronic atrial fibrillation with RVR. Patient is treated chronically with Cardizem and Eliquis. His heart rate was elevated on admission, likely from pneumonia and COPD exacerbation. Will continue Cardizem and Eliquis. -His heart rate has improved. -His TSH was within normal limits.  Type 2 diabetes mellitus with peripheral vascular disease. Patient is treated chronically with metformin and sliding scale NovoLog.. Metformin was held on admission, but was restarted. He was started on sliding scale NovoLog.  His CBGs have been controlled.  Normocytic anemia. Patient's hemoglobin was 10.3 on admission and has trended down to 9.3 without evidence of GI or GU bleeding. He was continued on his PPI. Anemia studies revealed of low iron of 22, normal vitamin B12, and normal ferritin. -Ferrous sulfate was started.  Hypokalemia and hypomagnesemia>>> hyperkalemia. Patient's serum potassium was 3.1 on admission. Potassium supplementation was started. His serum potassium trended down to 2.8. -2 runs of IV potassium was given and he was started on oral potassium 40 mEq twice a day. Will increase oral potassium to 40 mEq twice a day. -Magnesium sulfate IV was given for mild hypomagnesemia. -His serum potassium increased from  2.8-5.3. Oral potassium has been discontinued. We'll continue to monitor.    DVT prophylaxis:  Eliquis Code Status: Full code Family Communication: Family not available Disposition Plan: Discharge to home with clinically appropriate   Consultants:   None  Procedures:   None  Antimicrobials:   Rocephin 9/14>>  Azithromycin 9/13 >>   Subjective: Patient became short of breath early this morning necessitating an extra nebulizer treatment. Currently, he denies any worsening shortness of breath. He denies chest pain. He says that his cough is better.  Objective: Vitals:   07/26/16 0102 07/26/16 0406 07/26/16 0533 07/26/16 0750  BP:   108/62   Pulse:   80   Resp:   17   Temp:   97.5 F (36.4 C)   TempSrc:   Oral   SpO2: 98% 98% 100% 98%  Weight:      Height:        Intake/Output Summary (Last 24 hours) at 07/26/16 1209 Last data filed at 07/26/16 0500  Gross per 24 hour  Intake              543 ml  Output              750 ml  Net             -207 ml   Filed Weights   07/23/16 1902 07/24/16 0341 07/24/16 0500  Weight: 51.7 kg (114 lb) 51.5 kg (113 lb 8.6 oz) 51.5 kg (113 lb 8.6 oz)    Examination:  General exam: Appears calm and comfortable, but chronically debilitated appearing.  Respiratory system: Coarse breath sounds and occasional crackles on the right, decreased breath sounds in the bases.  Respiratory effort normal. Cardiovascular system: Irregular, irregular. No JVD. No pedal edema. Gastrointestinal system: Abdomen is nondistended, soft and nontender. No organomegaly or masses felt. Normal bowel sounds heard. Central nervous system: Alert and oriented. No focal neurological deficits. Extremities: Symmetric 5 x 5 power. Skin: No rashes, lesions or ulcers Psychiatry: Judgement and insight appear normal. Mood & affect appropriate.     Data Reviewed: I have personally reviewed following labs and imaging studies  CBC:  Recent Labs Lab 07/23/16 2013 07/24/16 0854 07/25/16 0422 07/26/16 0604  WBC 8.7 5.4 9.2 11.3*  HGB 10.3* 9.7* 9.3* 9.6*   HCT 34.6* 32.3* 30.6* 32.3*  MCV 88.7 88.7 87.7 89.2  PLT 399 363 365 [id number]   Basic Metabolic Panel:  Recent Labs Lab 07/23/16 2013 07/24/16 0930 07/25/16 0422 07/26/16 0604  NA 143 143 143 140  K 3.1* 3.7 2.8* 5.3*  CL 101 97* 102 105  CO2 31 31 32 29  GLUCOSE 142* 210* 99 100*  BUN 8 8 9 8   CREATININE 0.47* 0.39* 0.34* 0.39*  CALCIUM 8.5* 8.3* 7.8* 8.6*  MG  --   --  1.3* 2.1   GFR: Estimated Creatinine Clearance: 68 mL/min (by C-G formula based on SCr of 0.39 mg/dL (L)). Liver Function Tests: No results for input(s): AST, ALT, ALKPHOS, BILITOT, PROT, ALBUMIN in the last 168 hours. No results for input(s): LIPASE, AMYLASE in the last 168 hours. No results for input(s): AMMONIA in the last 168 hours. Coagulation Profile: No results for input(s): INR, PROTIME in the last 168 hours. Cardiac Enzymes:  Recent Labs Lab 07/23/16 2013 07/24/16 0340 07/24/16 0854 07/24/16 1519  TROPONINI 0.04* 0.04* <0.03 <0.03   BNP (last 3 results) No results for input(s): PROBNP in the last 8760 hours. HbA1C: No results for input(s): HGBA1C in the  last 72 hours. CBG:  Recent Labs Lab 07/25/16 1131 07/25/16 1616 07/25/16 2123 07/26/16 0732 07/26/16 1159  GLUCAP 100* 128* 135* 108* 95   Lipid Profile: No results for input(s): CHOL, HDL, LDLCALC, TRIG, CHOLHDL, LDLDIRECT in the last 72 hours. Thyroid Function Tests:  Recent Labs  07/24/16 0340  TSH 0.466   Anemia Panel:  Recent Labs  07/24/16 0854  VITAMINB12 340  FERRITIN 127  TIBC 337  IRON 22*   Sepsis Labs: No results for input(s): PROCALCITON, LATICACIDVEN in the last 168 hours.  Recent Results (from the past 240 hour(s))  MRSA PCR Screening     Status: None   Collection Time: 07/24/16  3:52 AM  Result Value Ref Range Status   MRSA by PCR NEGATIVE NEGATIVE Final    Comment:        The GeneXpert MRSA Assay (FDA approved for NASAL specimens only), is one component of a comprehensive MRSA  colonization surveillance program. It is not intended to diagnose MRSA infection nor to guide or monitor treatment for MRSA infections.          Radiology Studies: No results found.      Scheduled Meds: . apixaban  5 mg Oral BID  . aspirin EC  81 mg Oral Daily  . atorvastatin  20 mg Oral q1800  . azithromycin  500 mg Intravenous Q24H  . benzonatate  100 mg Oral TID  . cefTRIAXone (ROCEPHIN)  IV  1 g Intravenous Q24H  . diltiazem  240 mg Oral Daily  . ferrous sulfate  325 mg Oral Q breakfast  . furosemide  20 mg Oral Daily  . gabapentin  300 mg Oral TID  . insulin aspart  0-15 Units Subcutaneous TID WC  . insulin aspart  0-5 Units Subcutaneous QHS  . ipratropium  0.5 mg Nebulization Q4H WA  . levalbuterol  0.63 mg Nebulization Q4H WA  . mouth rinse  15 mL Mouth Rinse BID  . metFORMIN  1,000 mg Oral Q breakfast  . pantoprazole  40 mg Oral Daily  . predniSONE  20 mg Oral BID WC  . sodium chloride flush  3 mL Intravenous Q12H   Continuous Infusions:    LOS: 2 days    Time spent: 59 minutes    Rexene Alberts, MD [facility] Pager [phone]  If 7PM-7AM, please contact night-coverage [email or web address] Password [facility] 07/26/2016, 12:09 PM

## 2016-07-27 LAB — BASIC METABOLIC PANEL
Anion gap: 6 (ref 5–15)
BUN: 13 mg/dL (ref 6–20)
CALCIUM: 8.6 mg/dL — AB (ref 8.9–10.3)
CO2: 32 mmol/L (ref 22–32)
CREATININE: 0.44 mg/dL — AB (ref 0.61–1.24)
Chloride: 101 mmol/L (ref 101–111)
GLUCOSE: 116 mg/dL — AB (ref 65–99)
Potassium: 5.3 mmol/L — ABNORMAL HIGH (ref 3.5–5.1)
Sodium: 139 mmol/L (ref 135–145)

## 2016-07-27 LAB — CBC
HCT: 31.3 % — ABNORMAL LOW (ref 39.0–52.0)
Hemoglobin: 9 g/dL — ABNORMAL LOW (ref 13.0–17.0)
MCH: 25.6 pg — ABNORMAL LOW (ref 26.0–34.0)
MCHC: 28.8 g/dL — ABNORMAL LOW (ref 30.0–36.0)
MCV: 88.9 fL (ref 78.0–100.0)
PLATELETS: 384 10*3/uL (ref 150–400)
RBC: 3.52 MIL/uL — AB (ref 4.22–5.81)
RDW: 15.8 % — AB (ref 11.5–15.5)
WBC: 11.3 10*3/uL — AB (ref 4.0–10.5)

## 2016-07-27 LAB — GLUCOSE, CAPILLARY: GLUCOSE-CAPILLARY: 118 mg/dL — AB (ref 65–99)

## 2016-07-27 MED ORDER — BENZONATATE 100 MG PO CAPS: 100.0000 mg | ORAL_CAPSULE | Freq: Three times a day (TID) | ORAL | 0 refills | Status: DC

## 2016-07-27 MED ORDER — SODIUM POLYSTYRENE SULFONATE 15 GM/60ML PO SUSP: 15.0000 g | Freq: Once | ORAL | Status: DC

## 2016-07-27 MED ORDER — ASPIRIN 81 MG PO TBEC: 81.0000 mg | DELAYED_RELEASE_TABLET | Freq: Every day | ORAL | Status: AC

## 2016-07-27 MED ORDER — FERROUS SULFATE 325 (65 FE) MG PO TABS: 325.0000 mg | ORAL_TABLET | Freq: Every day | ORAL | Status: AC

## 2016-07-27 MED ORDER — IPRATROPIUM BROMIDE 0.02 % IN SOLN: 0.5000 mg | Freq: Three times a day (TID) | RESPIRATORY_TRACT | Status: AC

## 2016-07-27 MED ORDER — AZITHROMYCIN 250 MG PO TABS: 500.0000 mg | ORAL_TABLET | Freq: Every day | ORAL | Status: DC

## 2016-07-27 MED ORDER — ALBUTEROL SULFATE (2.5 MG/3ML) 0.083% IN NEBU: 2.5000 mg | INHALATION_SOLUTION | Freq: Three times a day (TID) | RESPIRATORY_TRACT | Status: AC

## 2016-07-27 MED ORDER — DILTIAZEM HCL ER 240 MG PO CP24: 240.0000 mg | ORAL_CAPSULE | Freq: Every day | ORAL | 6 refills | Status: AC

## 2016-07-27 MED ORDER — GABAPENTIN 300 MG PO CAPS: 300.0000 mg | ORAL_CAPSULE | Freq: Every day | ORAL | 6 refills | Status: AC

## 2016-07-27 MED ORDER — AZITHROMYCIN 500 MG PO TABS: ORAL_TABLET | ORAL | 0 refills | Status: DC

## 2016-07-27 MED ORDER — SODIUM POLYSTYRENE SULFONATE 15 GM/60ML PO SUSP
7.5000 g | Freq: Once | ORAL | Status: AC
  Administered 2016-07-27: 7.5 g via ORAL
  Filled 2016-07-27: qty 60

## 2016-07-27 NOTE — Progress Notes (Signed)
Reviewed discharge instructions with pt and answered questions at this time.

## 2016-07-27 NOTE — Progress Notes (Signed)
Pt to discharge home, stated will check sugar at home and use Insulin if needed.    Pt IV removed per NT, tolerated well.  Will discharge pt once ride is here.

## 2016-07-27 NOTE — Care Management Note (Signed)
Case Management Note  Patient Details  Name: Kenneth Merritt MRN: [id number] Date of Birth: 03-Apr-1951  Subjective/Objective:   Patient alert and oriented.  Patient request for [facility] services. Patient willing to participate with nursing services once discharged.          Action/Plan:  Patient to dischargehome with [place] RN to follow up with care.    Expected Discharge Date:   07/27/16               Expected Discharge Plan:  Home/Self Care  In-House Referral:  Financial Counselor  Discharge planning Services  CM Consult  Post Acute Care Choice:  NA Choice offered to:  NA  DME Arranged:    DME Agency:     HH Arranged:    HH Agency:     Status of Service:  In process, will continue to follow  If discussed at Long Length of Stay Meetings, dates discussed:    Additional Comments:  Briant Sites, RN 07/27/2016, 11:33 AM

## 2016-07-27 NOTE — Discharge Summary (Signed)
Physician Discharge Summary  Kenneth Merritt [id number] DOB: October 15, 1951 DOA: 07/23/2016  PCP: Neale Burly, MD  Admit date: 07/23/2016 Discharge date: 07/27/2016  Time spent: Greater than 30 minutes  Recommendations for Outpatient Follow-up:  1. Recommend follow-up serum potassium in one week for a discharge potassium of 5.3.    Discharge Diagnoses:  1. Atypical chest pain. 2. Mildly elevated troponin I, not consistent with ACS. 3. COPD with exacerbation. 4. Community-acquired pneumonia. 5. Chronic respiratory failure with hypoxia. 6. Chronic age or fibrillation with RVR. 7. Type 2 diabetes mellitus. 8. Hypokalemia and subsequent hypokalemia. 9. Mild hypomagnesemia. 10. Iron deficiency anemia. 11. Dyslipidemia.  Discharge Condition: Improved.  Diet recommendation: Heart healthy/carbohydrate modified.  Filed Weights   07/23/16 1902 07/24/16 0341 07/24/16 0500  Weight: 51.7 kg (114 lb) 51.5 kg (113 lb 8.6 oz) 51.5 kg (113 lb 8.6 oz)    History of present illness:  Patient is a 65 year old man with a history of A. fib on Eliquis, PAD, status post aortobifemoral bypass surgery, CAD, oxygen dependent COPD, and GERD, who presented to the ED on 07/23/16 with a chief complaint of abdominal pain. In the ED, he was afebrile and tachycardic, with a normal blood pressure. His lab data were significant for potassium of 3.1, glucose of 142, normal WBC, hemoglobin of 10.3, troponin I of 0.04. His chest x-ray that revealed ill-defined patchy opacity in the right midlung zone, concerning for pneumonia. EKG revealed tachycardia and multiple premature complexes. He was admitted for further evaluation and management.  Hospital Course:  1. Atypical chest pain in a patient with CAD and marginally elevated troponin I. -Patient was restarted on his chronic cardiac medications including Lipitor, Cardizem, Eliquis, and when needed furosemide.  -His troponin I was cycled and was marginally elevated, but  it quickly normalized, not consistent with ACS. -The patient's chest pain was likely secondary to pneumonia and COPD exacerbation. His EKG revealed no suspicious ST or T-wave abnormality, although there was some tachycardia which was likely from nebulizers and some respiratory distress.  Community-acquired pneumonia with superimposed COPD exacerbation. Patient's chest x-ray results  noted on admission. 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 -Patient was continued on oxygen. Antibiotic therapy was started with azithromycin. Rocephin was added. -He was started on a higher dose of prednisone and Atrovent/Xopenex nebulizer for treatment of COPD exacerbation. Tessalon Perles was added for cough. -Follow-up chest x-ray revealed near complete resolution of the right middle lobe pneumonia. -His white blood cell count did increase a little, but this was likely from the higher dose of prednisone. He remained afebrile. -He was discharged on 4 more days of Zithromax and his usual dose of prednisone along with his nebulizers. -Of note, the patient reported that he has an appointment with a new pulmonologist in [place] later this week.  Chronic atrial fibrillation with RVR. Patient is treated chronically with Cardizem and Eliquis. His heart rate was elevated on admission, likely from pneumonia and COPD exacerbation. The dose of oral Cardizem was increased. He was continued on  Eliquis. -His heart rate improved. -His TSH was within normal limits.  Type 2 diabetes mellitus with peripheral vascular disease. Patient is treated chronically with metformin and sliding scale NovoLog. Both were restarted. His CBGs were controlled.  Normocytic anemia. Patient's hemoglobin was 10.3 on admission and has trended down to 9.3 without evidence of GI or GU bleeding. He was continued on his PPI. Anemia studies revealed of low iron of  22, normal vitamin B12, and normal  ferritin. -Ferrous sulfate was started.  Hypokalemia and hypomagnesemia>>> hyperkalemia. Patient's serum potassium was 3.1 on admission. Potassium supplementation was started. His serum potassium trended down to 2.8. -2 runs of IV potassium was given and he was started on oral potassium 40 mEq twice a day. -Magnesium sulfate IV was given for mild hypomagnesemia. -His serum potassium increased to 5.3. Potassium chloride was discontinued. He was given 1 dose of Kayexalate and oral Lasix prior to hospital discharge. Recommend follow-up serum potassium in one week.    Procedures:  None  Consultations:  None  Discharge Exam: Vitals:   07/26/16 2017 07/27/16 0549  BP: 114/79 107/84  Pulse: 100 79  Resp: 18 18  Temp: 97.6 F (36.4 C) 97.6 F (36.4 C)  O2 sats 93% on oxygen.  General exam: Appears calm and comfortable, but chronically debilitated appearing.  Respiratory system: Better air movement, but with a few persistent crackly wheezes.   Respiratory effort normal. Cardiovascular system: Irregular, irregular. No JVD. No pedal edema. Gastrointestinal system: Abdomen is nondistended, soft and nontender. No organomegaly or masses felt. Normal bowel sounds heard. Central nervous system: Alert and oriented. No focal neurological deficits.  Discharge Instructions   Discharge Instructions    Diet - low sodium heart healthy    Complete by:  As directed    Discharge instructions    Complete by:  As directed    The dose of your diltiazem medicine was increased to 240 mg daily to keep your heart rate under control. Take medications as prescribed. Follow-up with your doctor.   Increase activity slowly    Complete by:  As directed      Current Discharge Medication List    START taking these medications   Details  aspirin EC 81 MG EC tablet Take 1 tablet (81 mg total) by mouth daily.    azithromycin (ZITHROMAX) 500 MG tablet Antibiotic to be taken for 4 more days, starting  07/28/16. Qty: 4 each, Refills: 0    benzonatate (TESSALON) 100 MG capsule Take 1 capsule (100 mg total) by mouth 3 (three) times daily. For cough. Qty: 20 capsule, Refills: 0    ferrous sulfate 325 (65 FE) MG tablet Take 1 tablet (325 mg total) by mouth daily with breakfast. Iron supplement to treat your anemia.      CONTINUE these medications which have CHANGED   Details  albuterol (PROVENTIL) (2.5 MG/3ML) 0.083% nebulizer solution Take 3 mLs (2.5 mg total) by nebulization 3 (three) times daily.    diltiazem (DILACOR XR) 240 MG 24 hr capsule Take 1 capsule (240 mg total) by mouth daily. Qty: 30 capsule, Refills: 6    gabapentin (NEURONTIN) 300 MG capsule Take 1 capsule (300 mg total) by mouth at bedtime. Qty: 30 capsule, Refills: 6    ipratropium (ATROVENT) 0.02 % nebulizer solution Take 2.5 mLs (0.5 mg total) by nebulization 3 (three) times daily.      CONTINUE these medications which have NOT CHANGED   Details  albuterol (PROVENTIL HFA;VENTOLIN HFA) 108 (90 BASE) MCG/ACT inhaler Inhale 2 puffs into the lungs every 4 (four) hours as needed for wheezing or shortness of breath. Qty: 1 Inhaler, Refills: 0    ALPRAZolam (XANAX) 0.25 MG tablet Take 0.25 mg by mouth 3 (three) times daily as needed for anxiety or sleep.  Refills: 0    atorvastatin (LIPITOR) 20 MG tablet Take 1 tablet (20 mg total) by mouth daily at 6 PM. Qty: 30 tablet, Refills:  1    ELIQUIS 5 MG TABS tablet TAKE 1 TABLET BY MOUTH TWICE A DAY Qty: 60 tablet, Refills: 3    furosemide (LASIX) 20 MG tablet Take 1 tablet (20 mg total) by mouth daily as needed for fluid or edema. as needed for swelling/weight gain of >2lbs overnight or 4-5lbs in 1 week Qty: 30 tablet, Refills: 0    HYDROcodone-acetaminophen (NORCO/VICODIN) 5-325 MG tablet Take 1-2 tablets by mouth every 6 (six) hours as needed for moderate pain or severe pain. Refills: 0    insulin aspart (NOVOLOG) 100 UNIT/ML FlexPen Inject into the skin. Sliding  scale.    nitroGLYCERIN (NITROSTAT) 0.4 MG SL tablet Place 1 tablet (0.4 mg total) under the tongue every 5 (five) minutes x 3 doses as needed for chest pain. Qty: 25 tablet, Refills: 3    omeprazole (PRILOSEC) 20 MG capsule Take 20 mg by mouth every morning.     predniSONE (DELTASONE) 10 MG tablet Take 10 mg by mouth daily.      STOP taking these medications     diltiazem (CARDIZEM CD) 240 MG 24 hr capsule      metFORMIN (GLUCOPHAGE) 1000 MG tablet        Allergies  Allergen Reactions  . Levaquin [Levofloxacin In D5w] Other (See Comments)    Pt states it caused dry mouth and rectal bleeding.    Follow-up Information    Neale Burly, MD Follow up in 1 day(s).   Specialty:  Internal Medicine Why:  AS SCHEDULED. Contact information: [place] [id number] [phone]            The results of significant diagnostics from this hospitalization (including imaging, microbiology, ancillary and laboratory) are listed below for reference.    Significant Diagnostic Studies: Dg Chest 2 View  Result Date: 07/26/2016 CLINICAL DATA:  Follow-up pneumonia. EXAM: CHEST  2 VIEW COMPARISON:  07/23/2016 FINDINGS: The cardiomediastinal silhouette is unchanged. Emphysema and biapical pleural-parenchymal scarring again noted. Near complete resolution of right middle lobe opacity noted. No pleural effusion, mass, pulmonary edema or pneumothorax noted. IMPRESSION: Near complete resolution of right middle lobe opacity/pneumonia. Emphysema. Electronically Signed   By: Margarette Canada M.D.   On: 07/26/2016 14:25   Dg Chest 2 View  Result Date: 07/23/2016 CLINICAL DATA:  Chest pain, shortness of breath and dizziness for 2 hours. EXAM: CHEST  2 VIEW COMPARISON:  Radiographs 05/10/2016.  Chest CT 06/27/2016 FINDINGS: Hyperinflation with advanced emphysema. Irregular streaky opacity in the right midlung zone at site of nodule on CT. There is biapical pleural parenchymal scarring.  Ill-defined patchy opacity in the right midlung zone likely localizes to the middle lobe on the lateral view. There is no pleural effusion. No pneumothorax. Compression deformities in the midthoracic spine are unchanged from CT. IMPRESSION: Ill-defined patchy opacity in the right midlung zone, likely in the middle lobe, concerning for infection/pneumonia. Irregular streaky opacity superiorly in the right midlung zone at site of irregular nodularity on prior CT. Electronically Signed   By: Jeb Levering M.D.   On: 07/23/2016 20:00   Ct Super D Chest Wo Contrast  Result Date: 06/27/2016 CLINICAL DATA:  Right upper lobe and right lower lobe opacities. Pulmonary nodule. EXAM: CT CHEST WITHOUT CONTRAST TECHNIQUE: Multidetector CT imaging of the chest was performed using thin slice collimation for electromagnetic bronchoscopy planning purposes, without intravenous contrast. COMPARISON:  Multiple exams, including 04/22/2016 FINDINGS: Cardiovascular: Coronary, aortic arch, and branch vessel atherosclerotic vascular disease. Heart size within  normal limits. Mediastinum/Nodes: No pathologic thoracic adenopathy identified. Esophagus unremarkable. Lungs/Pleura: Severe emphysema. Considerable biapical pleural parenchymal scarring. This is partially calcified. Mildly increased reticulonodular opacities anteriorly in the right middle lobe, tree-in-bud configuration favors atypical infectious bronchiolitis. Similar findings are present anteriorly in the right lower lobe and are new compared to prior. Peribronchovascular nodule in the right lower lobe measuring 1.6 by 0.9 cm on image 31/3, essentially stable from prior on image 31/3. New mildly bandlike but slightly indistinct peripheral airspace opacity anteriorly in the left upper lobe on images 33-36 series 3, likely inflammatory. Improved nodularity along the right inferior pulmonary ligament compared to prior. Upper Abdomen: Unremarkable Musculoskeletal: Old healed left  anterior rib fractures. T6 compression fracture with increased sclerosis and worsening loss of height, now 8 mm craniocaudad previously 12 mm, compatible with interval compression. Mild posterior bony retropulsion of about 2 mm. Sclerosis in the T7 vertebral body compatible with subacute compression fracture, not appreciably changed from prior exam. Old compression fracture at T8, unchanged. IMPRESSION: 1. Atypical infectious bronchiolitis in the right middle lobe, anteriorly in the right lower lobe, and anteriorly in the left upper lobe. 2. Persistent nodule superior segment right lower lobe currently measuring 1.6 by 0.9 cm -malignancy not excluded. 3. Improvement in the nodularity along the right inferior pulmonary ligament compared to the prior exam. 4. Progressive collapse of the T6 vertebral body with new sclerosis. Vertebral body height is reduced compared to prior. 5. Subacute T7 compression fracture, stable compared to prior CT. Old compression fracture T8. 6. Severe emphysema. 7. Coronary, aortic arch, and branch vessel atherosclerotic vascular disease. Electronically Signed   By: Van Clines M.D.   On: 06/27/2016 16:29    Microbiology: Recent Results (from the past 240 hour(s))  MRSA PCR Screening     Status: None   Collection Time: 07/24/16  3:52 AM  Result Value Ref Range Status   MRSA by PCR NEGATIVE NEGATIVE Final    Comment:        The GeneXpert MRSA Assay (FDA approved for NASAL specimens only), is one component of a comprehensive MRSA colonization surveillance program. It is not intended to diagnose MRSA infection nor to guide or monitor treatment for MRSA infections.      Labs: Basic Metabolic Panel:  Recent Labs Lab 07/23/16 2013 07/24/16 0930 07/25/16 0422 07/26/16 0604 07/27/16 0610  NA 143 143 143 140 139  K 3.1* 3.7 2.8* 5.3* 5.3*  CL 101 97* 102 105 101  CO2 31 31 32 29 32  GLUCOSE 142* 210* 99 100* 116*  BUN 8 8 9 8 13   CREATININE 0.47* 0.39*  0.34* 0.39* 0.44*  CALCIUM 8.5* 8.3* 7.8* 8.6* 8.6*  MG  --   --  1.3* 2.1  --    Liver Function Tests: No results for input(s): AST, ALT, ALKPHOS, BILITOT, PROT, ALBUMIN in the last 168 hours. No results for input(s): LIPASE, AMYLASE in the last 168 hours. No results for input(s): AMMONIA in the last 168 hours. CBC:  Recent Labs Lab 07/23/16 2013 07/24/16 0854 07/25/16 0422 07/26/16 0604 07/27/16 0610  WBC 8.7 5.4 9.2 11.3* 11.3*  HGB 10.3* 9.7* 9.3* 9.6* 9.0*  HCT 34.6* 32.3* 30.6* 32.3* 31.3*  MCV 88.7 88.7 87.7 89.2 88.9  PLT 399 363 365 400 384   Cardiac Enzymes:  Recent Labs Lab 07/23/16 2013 07/24/16 0340 07/24/16 0854 07/24/16 1519  TROPONINI 0.04* 0.04* <0.03 <0.03   BNP: BNP (last 3 results)  Recent Labs  04/06/16 1906 04/23/16  1459 05/10/16 1540  BNP 140.0* 117.8* 68.7    ProBNP (last 3 results) No results for input(s): PROBNP in the last 8760 hours.  CBG:  Recent Labs Lab 07/26/16 0732 07/26/16 1159 07/26/16 1640 07/26/16 1938 07/27/16 0735  GLUCAP 108* 95 117* 157* 118*       Signed:  Yusuf Yu MD.  [facility] 07/27/2016, 11:05 AM

## 2016-07-27 NOTE — Care Management Note (Deleted)
Case Management Note  Patient Details  Name: Kenneth Merritt MRN: [id number] Date of Birth: 07-Jun-1951  Subjective/Objective:                    Action/Plan:   Expected Discharge Date:   07/27/16               Expected Discharge Plan:  [place]  In-House Referral:  Financial Counselor  Discharge planning Services  CM Consult  Post Acute Care Choice:  NA Choice offered to:  NA, Patient  DME Arranged:    DME Agency:     HH Arranged:  RN [place] Agency:  [place]  Status of Service:  Completed, signed off  If discussed at [place] of Stay Meetings, dates discussed:    Additional Comments:  Briant Sites, RN 07/27/2016, 11:53 AM

## 2016-07-31 ENCOUNTER — Telehealth: Payer: Self-pay

## 2016-07-31 NOTE — Telephone Encounter (Signed)
Spoke with pt. Made him aware that we have no documentation where anyone from our office as called him. He apologized for any confusion. Nothing further was needed at this time.

## 2016-07-31 NOTE — Telephone Encounter (Signed)
I do not see where anyone has called patient.    LMTCB x1

## 2016-08-01 ENCOUNTER — Ambulatory Visit (INDEPENDENT_AMBULATORY_CARE_PROVIDER_SITE_OTHER): Payer: Medicare Other | Admitting: Adult Health

## 2016-08-01 ENCOUNTER — Encounter: Payer: Self-pay | Admitting: Adult Health

## 2016-08-01 VITALS — BP 118/74 | HR 125 | Temp 98.0°F | Ht 64.0 in | Wt 116.0 lb

## 2016-08-01 DIAGNOSIS — R918 Other nonspecific abnormal finding of lung field: Secondary | ICD-10-CM | POA: Diagnosis not present

## 2016-08-01 DIAGNOSIS — J441 Chronic obstructive pulmonary disease with (acute) exacerbation: Secondary | ICD-10-CM | POA: Diagnosis not present

## 2016-08-01 DIAGNOSIS — I25110 Atherosclerotic heart disease of native coronary artery with unstable angina pectoris: Secondary | ICD-10-CM

## 2016-08-01 NOTE — Assessment & Plan Note (Signed)
Resolving exacerbation   Plan Patient Instructions  Finish Antibiotics as directed.  Set up PET scan in 2-3 weeks  Continue on current regimen  Follow up Dr. Lamonte Sakai  In 4-6 week and As needed

## 2016-08-01 NOTE — Assessment & Plan Note (Addendum)
Lung nodules are suspicous for malignancy  Needs PET scan  PFT pending   Patient Instructions  Finish Antibiotics as directed.  Set up PET scan in 2-3 weeks  Continue on current regimen  Follow up Dr. Lamonte Sakai  In 4-6 week and As needed

## 2016-08-01 NOTE — Progress Notes (Signed)
Subjective:    Patient ID: Kenneth Merritt, male    DOB: [birth year], 65 y.o.   MRN: [id number]  HPI 65 year old male former smoker seen for pulmonary consult 06/04/2016 for COPD and lung nodule   08/01/2016 follow up COPD and lung nodule Returns for a two-month follow-up. Patient was seen for pulmonary consult 06/04/2016 for evaluation of COPD and lung nodule. Previous PFT in 2010 showed severe COPD. 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 During these hospitalizations CT on 04/22/2016 showed a 1.7 cm right upper lobe nodule. And a 1.8.cm Nodular consolidation along the right lower lobe. He was treated for pneumonia with antibiotics. He was set up for a repeat CT chest on 06/27/2016 that showed persistent RLL nodule. , atypical broncholitis in RML, LUL , improved nodularity on right . Compression verterbral fx.  Along with severe emphysema. He was readmitted again last week, with atypical chest and COPD flare , tx w/ abx. [name] He is feeling some better but remains weak. Gets winded easily . + weight loss over last 4 months . Repeat  PFT are  pending .  He remains on Atrovent/Albuterol  neb Three times a day   He denies Hemoptysis, orthopnea, PND, or increased leg swelling.   Past Medical History:  Diagnosis Date  . Atrial fibrillation (HCC)    post-op a-fib. 3/11. post Aortobifem , /  Atrial fibrillation from nebulizer treatment  May, 2012, while hospitalized  . CAD (coronary artery disease), native coronary artery    catheterization 06/2009.Marland KitchenMarland Kitchen70% proximal LAD and 30% proximal RCA. normal LV function, no intervention planned prior to surgery for aorta. LV normal function  by catheter 8/10.   . Cancer [facility]) 2013   Prostate Radiation  . Chronic pain syndrome   . Chronic thoracic back pain   . COPD (chronic obstructive pulmonary disease) ([place])    severe by PFTs August 2010  . DJD (degenerative joint disease)   . Dyslipidemia   . Ejection fraction    60%,  echo, May, 2012, rapid atrial fib at the time  . GERD (gastroesophageal reflux disease)   . Myocardial infarction ([place]) 2011  . On home O2    2L N/[place]  . Peripheral vascular disease, unspecified ([place])    bilateral intermittent claudication  . Pneumothorax     2011  before aortobifemoral surgery,  resolved  . Renal artery stenosis ([place])     presumably corrected at time of aortobifem surgery March, 2011  . 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 Tobacco user     stop smoking   Current Outpatient Prescriptions on File Prior to Visit  Medication Sig Dispense Refill  . albuterol (PROVENTIL HFA;VENTOLIN HFA) 108 (90 BASE) MCG/ACT inhaler Inhale 2 puffs into the lungs every 4 (four) hours as needed for wheezing or shortness of breath. 1 Inhaler 0  . albuterol (PROVENTIL) (2.5 MG/3ML) 0.083% nebulizer solution Take 3 mLs (2.5 mg total) by nebulization 3 (three) times daily.    [name] ALPRAZolam (XANAX) 0.25 MG tablet Take 0.25 mg by mouth 3 (three) times daily as needed for anxiety or sleep.   0  . aspirin EC 81 MG EC tablet Take 1 tablet (81 mg total) by mouth daily.    [name] atorvastatin (LIPITOR) 20 MG tablet Take 1 tablet (20 mg total) by mouth daily at 6 PM. 30  tablet 1  . azithromycin (ZITHROMAX) 500 MG tablet Antibiotic to be taken for 4 more days, starting 07/28/16. 4 each 0  . diltiazem (DILACOR XR) 240 MG 24 hr capsule Take 1 capsule (240 mg total) by mouth daily. 30 capsule 6  . ELIQUIS 5 MG TABS tablet TAKE 1 TABLET BY MOUTH TWICE A DAY 60 tablet 3  . ferrous sulfate 325 (65 FE) MG tablet Take 1 tablet (325 mg total) by mouth daily with breakfast. Iron supplement to treat your anemia.    . furosemide (LASIX) 20 MG tablet Take 1 tablet (20 mg total) by mouth daily as needed for fluid or edema. as needed for swelling/weight gain of >2lbs overnight or 4-5lbs in 1 week 30 tablet 0  . gabapentin (NEURONTIN) 300 MG  capsule Take 1 capsule (300 mg total) by mouth at bedtime. 30 capsule 6  . HYDROcodone-acetaminophen (NORCO/VICODIN) 5-325 MG tablet Take 1-2 tablets by mouth every 6 (six) hours as needed for moderate pain or severe pain.  0  . insulin aspart (NOVOLOG) 100 UNIT/ML FlexPen Inject into the skin. Sliding scale.    [name] ipratropium (ATROVENT) 0.02 % nebulizer solution Take 2.5 mLs (0.5 mg total) by nebulization 3 (three) times daily.    [name] omeprazole (PRILOSEC) 20 MG capsule Take 20 mg by mouth every morning.     . benzonatate (TESSALON) 100 MG capsule Take 1 capsule (100 mg total) by mouth 3 (three) times daily. For cough. (Patient not taking: Reported on 08/01/2016) 20 capsule 0  . nitroGLYCERIN (NITROSTAT) 0.4 MG SL tablet Place 1 tablet (0.4 mg total) under the tongue every 5 (five) minutes x 3 doses as needed for chest pain. (Patient not taking: Reported on 08/01/2016) 25 tablet 3  . predniSONE (DELTASONE) 10 MG tablet Take 10 mg by mouth daily.     No current facility-administered medications on file prior to visit.      Review of Systems .Constitutional:   No  night sweats,  Fevers, chills,  +fatigue, or  lassitude.  HEENT:   No headaches,  Difficulty swallowing,  Tooth/dental problems, or  Sore throat,                No sneezing, itching, ear ache, nasal congestion, post nasal drip,   CV:  No chest pain,  Orthopnea, PND, swelling in lower extremities, anasarca, dizziness, palpitations, syncope.   GI  No heartburn, indigestion, abdominal pain, nausea, vomiting, diarrhea, change in bowel habits, loss of appetite, bloody stools.   Resp:    No chest wall deformity  Skin: no rash or lesions.  GU: no dysuria, change in color of urine, no urgency or frequency.  No flank pain, no hematuria   MS:  No joint pain or swelling.  No decreased range of motion.  No back pain.  Psych:  No change in mood or affect. No depression or anxiety.  No memory loss.         Objective:   Physical  Exam Vitals:   08/01/16 1233  BP: 118/74  Pulse: (!) 125  Temp: 98 F (36.7 C)  SpO2: 96%  Weight: 116 lb (52.6 kg)  Height: 5\' 4"  (1.626 m)   GEN: A/Ox3; pleasant , NAD, chronically ill appearing    HEENT:  [place]/AT,  EACs-clear, TMs-wnl, NOSE-clear, THROAT-clear, no lesions, no postnasal drip or exudate noted.   NECK:  Supple w/ fair ROM; no JVD; normal carotid impulses w/o bruits; no thyromegaly or nodules palpated; no lymphadenopathy.    RESP  Decreased BS in bases w/o, wheezes/ rales/ or rhonchi. no accessory muscle use, no dullness to percussion  CARD:  RRR, no m/r/g  , no peripheral edema, pulses intact, no cyanosis or clubbing.  GI:   Soft & nt; nml bowel sounds; no organomegaly or masses detected.   Musco: Warm bil, no deformities or joint swelling noted.   Neuro: alert, no focal deficits noted.    Skin: Warm, no lesions or rashes  Rosealynn Mateus NP-C  [facility]  08/01/2016         Assessment & Plan:

## 2016-08-01 NOTE — Patient Instructions (Signed)
Finish Antibiotics as directed.  Set up PET scan in 2-3 weeks  Continue on current regimen  Follow up Dr. Lamonte Sakai  In 4-6 week and As needed

## 2016-08-13 ENCOUNTER — Ambulatory Visit (HOSPITAL_COMMUNITY)
Admission: RE | Admit: 2016-08-13 | Discharge: 2016-08-13 | Disposition: A | Discharge status: Home / Self Care | Payer: Medicare Other | Source: Ambulatory Visit | Attending: Adult Health | Admitting: Adult Health

## 2016-08-13 DIAGNOSIS — Z79899 Other long term (current) drug therapy: Secondary | ICD-10-CM | POA: Diagnosis not present

## 2016-08-13 DIAGNOSIS — R918 Other nonspecific abnormal finding of lung field: Secondary | ICD-10-CM | POA: Insufficient documentation

## 2016-08-13 LAB — GLUCOSE, CAPILLARY: Glucose-Capillary: 98 mg/dL (ref 65–99)

## 2016-08-13 MED ORDER — FLUDEOXYGLUCOSE F - 18 (FDG) INJECTION
5.7500 | Freq: Once | INTRAVENOUS | Status: AC | PRN
  Administered 2016-08-13: 5.75 via INTRAVENOUS

## 2016-08-13 NOTE — Assessment & Plan Note (Signed)
Recent exacerbation -now resolving  PFT pending   Plan  Finish abx  Cont on current regimen

## 2016-08-14 ENCOUNTER — Telehealth: Payer: Self-pay | Admitting: Emergency Medicine

## 2016-08-14 NOTE — Telephone Encounter (Signed)
Spoke with pt and gave results per TP. Nothing further needed.

## 2016-08-19 ENCOUNTER — Other Ambulatory Visit: Payer: Self-pay | Admitting: *Deleted

## 2016-08-19 MED ORDER — APIXABAN 5 MG PO TABS: 5.0000 mg | ORAL_TABLET | Freq: Two times a day (BID) | ORAL | 3 refills | Status: AC

## 2016-08-28 ENCOUNTER — Ambulatory Visit: Payer: Medicare Other | Admitting: Emergency Medicine

## 2016-09-16 ENCOUNTER — Encounter: Payer: Self-pay | Admitting: Emergency Medicine

## 2016-09-16 ENCOUNTER — Ambulatory Visit (INDEPENDENT_AMBULATORY_CARE_PROVIDER_SITE_OTHER): Payer: Medicare Other | Admitting: Emergency Medicine

## 2016-09-16 DIAGNOSIS — I25110 Atherosclerotic heart disease of native coronary artery with unstable angina pectoris: Secondary | ICD-10-CM

## 2016-09-16 DIAGNOSIS — R918 Other nonspecific abnormal finding of lung field: Secondary | ICD-10-CM

## 2016-09-16 DIAGNOSIS — J441 Chronic obstructive pulmonary disease with (acute) exacerbation: Secondary | ICD-10-CM | POA: Diagnosis not present

## 2016-09-16 MED ORDER — DOXYCYCLINE HYCLATE 100 MG PO TABS: 100.0000 mg | ORAL_TABLET | Freq: Two times a day (BID) | ORAL | 0 refills | Status: AC

## 2016-09-16 MED ORDER — PREDNISONE 10 MG PO TABS: ORAL_TABLET | ORAL | 0 refills | Status: DC

## 2016-09-16 NOTE — Assessment & Plan Note (Signed)
Acute exacerbation. He does not appear to have a viral syndrome, I suspect that this is due to increased rhinitis and allergy season. It does not take much to tip him over into a bronchitis. I will treat him with doxycycline since he does not tolerate Levaquin (not a true allergy).  Please take prednisone as directed, then stay on your usual 10mg  daily after Take doxycycline 100mg  twice a day for 7 days.  Continue your albuterol / ipratropium nebulizers every 6 hours.  You will need to get the flu shot after we clear this flare-up.  Continue your oxygen at 3L/min at all times.  We will repeat your CT chest in 1 year.  Follow with Dr Lamonte Sakai in 4 months or sooner if you have any problems.

## 2016-09-16 NOTE — Assessment & Plan Note (Signed)
No evidence of hypermetabolism and some clearance of his dominant nodule on most recent PET scan 08/2016. He may have mycobacterial colonization. Given his fragile state I'm hesitant to perform bronchoscopy for cultures unless he clinically changes. We will plan to repeat his CT scan of the chest in one year, October 2018.

## 2016-09-16 NOTE — Progress Notes (Signed)
Subjective:    Patient ID: Kenneth Merritt, male    DOB: [birth year], 65 y.o.   MRN: [id number]  HPI 65 year old man with History tobacco use 80 pack years, coronary artery disease, Atrial fibrillation, Severe COPD diagnosed by pulmonary function testing in August 2010. Chronic hypoxemic respiratory failure. He was admitted to the hospital In May and June 2017 with sepsis related to diverticular disease. This was Complicated by atrial fibrillation with RVR.  He was found to have a right Upper lobe superior segmental nodular opacity consistent with possible aspiration pneumonia. He was treated for this with antibiotics. A repeat chest x-ray performed on 05/10/16 showed some mild interstitial markings but Apparent resolution of his nodular infiltrate.  He is on DuoNeb 4x a day. On O2 at 2-3L/min. He has freq cough, sputum production.   ROV 09/16/16 - - This follow-up visit for severe COPD. Following nodular disease by CT scan of the chest after an admission to the hospital June 2017. Most recent CT scan of the chest was done on /18/17 that I personally reviewed. This showed decrease in size of some right inferior pulmonary nodularity, stable right lower lobe nodule 1.6 x 0.9 cm, and some right middle lobe / RLL, LUL micronodular disease. As part of his surveillance a PET scan was performed on 08/13/16 that shows Reece density of his right lower lobe lesion, the lesion is not hypermetabolic. He other areas of chronic inflammatory change do have low-level hypermetabolic activity, question possible mycobacterial disease. He is on pred 10mg .   He has been feeling a bit more dyspneic, having more cough and yellow mucous. More wheeze and BD use.    Review of Systems As per HPI   Past Medical History:  Diagnosis Date  . Atrial fibrillation (HCC)    post-op a-fib. 3/11. post Aortobifem , /  Atrial fibrillation from nebulizer treatment  May, 2012, while hospitalized  . CAD (coronary artery disease), native  coronary artery    catheterization 06/2009.Marland KitchenMarland Kitchen70% proximal LAD and 30% proximal RCA. normal LV function, no intervention planned prior to surgery for aorta. LV normal function  by catheter 8/10.   . Cancer [facility]) 2013   Prostate Radiation  . Chronic pain syndrome   . Chronic thoracic back pain   . COPD (chronic obstructive pulmonary disease) ([place])    severe by PFTs August 2010  . DJD (degenerative joint disease)   . Dyslipidemia   . Ejection fraction    60%, echo, May, 2012, rapid atrial fib at the time  . GERD (gastroesophageal reflux disease)   . Myocardial infarction 2011  . On home O2    2L N/C  . Peripheral vascular disease, unspecified    bilateral intermittent claudication  . Pneumothorax     2011  before aortobifemoral surgery,  resolved  . Renal artery stenosis ([place])     presumably corrected at time of aortobifem surgery March, 2011  . 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 Tobacco user     stop smoking     Family History  Problem Relation Age of Onset  . Cancer Father     prostate  . Heart attack Father      Social History   Social History  . Marital status: Married    Spouse name: N/A  . Number of children: N/A  . Years of education: N/A   Occupational  History  . Not on file.   Social History Main Topics  . Smoking status: Former Smoker    Packs/day: 2.00    Years: 40.00    Types: Cigarettes    Quit date: 01/11/2010  . Smokeless tobacco: Never Used  . Alcohol use No  . Drug use: No  . Sexual activity: Not on file   Other Topics Concern  . Not on file   Social History Narrative   Married, retired.      Allergies  Allergen Reactions  . Levaquin [Levofloxacin In D5w] Other (See Comments)    Pt states it caused dry mouth and rectal bleeding.      Outpatient Medications Prior to Visit  Medication Sig Dispense Refill  . albuterol (PROVENTIL HFA;VENTOLIN HFA)  108 (90 BASE) MCG/ACT inhaler Inhale 2 puffs into the lungs every 4 (four) hours as needed for wheezing or shortness of breath. 1 Inhaler 0  . albuterol (PROVENTIL) (2.5 MG/3ML) 0.083% nebulizer solution Take 3 mLs (2.5 mg total) by nebulization 3 (three) times daily.    [name] ALPRAZolam (XANAX) 0.25 MG tablet Take 0.25 mg by mouth 3 (three) times daily as needed for anxiety or sleep.   0  . apixaban (ELIQUIS) 5 MG TABS tablet Take 1 tablet (5 mg total) by mouth 2 (two) times daily. 180 tablet 3  . aspirin EC 81 MG EC tablet Take 1 tablet (81 mg total) by mouth daily.    [name] atorvastatin (LIPITOR) 20 MG tablet Take 1 tablet (20 mg total) by mouth daily at 6 PM. 30 tablet 1  . azithromycin (ZITHROMAX) 500 MG tablet Antibiotic to be taken for 4 more days, starting 07/28/16. 4 each 0  . benzonatate (TESSALON) 100 MG capsule Take 1 capsule (100 mg total) by mouth 3 (three) times daily. For cough. 20 capsule 0  . diltiazem (DILACOR XR) 240 MG 24 hr capsule Take 1 capsule (240 mg total) by mouth daily. 30 capsule 6  . ferrous sulfate 325 (65 FE) MG tablet Take 1 tablet (325 mg total) by mouth daily with breakfast. Iron supplement to treat your anemia.    [name] gabapentin (NEURONTIN) 300 MG capsule Take 1 capsule (300 mg total) by mouth at bedtime. 30 capsule 6  . HYDROcodone-acetaminophen (NORCO/VICODIN) 5-325 MG tablet Take 1-2 tablets by mouth every 6 (six) hours as needed for moderate pain or severe pain.  0  . ipratropium (ATROVENT) 0.02 % nebulizer solution Take 2.5 mLs (0.5 mg total) by nebulization 3 (three) times daily.    . nitroGLYCERIN (NITROSTAT) 0.4 MG SL tablet Place 1 tablet (0.4 mg total) under the tongue every 5 (five) minutes x 3 doses as needed for chest pain. 25 tablet 3  . omeprazole (PRILOSEC) 20 MG capsule Take 20 mg by mouth every morning.     . predniSONE (DELTASONE) 10 MG tablet Take 10 mg by mouth daily.    . furosemide (LASIX) 20 MG tablet Take 1 tablet (20 mg total) by mouth daily as  needed for fluid or edema. as needed for swelling/weight gain of >2lbs overnight or 4-5lbs in 1 week (Patient not taking: Reported on 09/16/2016) 30 tablet 0  . insulin aspart (NOVOLOG) 100 UNIT/ML FlexPen Inject into the skin. Sliding scale.     No facility-administered medications prior to visit.         Objective:   Physical Exam Vitals:   09/16/16 0926  BP: 122/60  Pulse: (!) 116  SpO2: 100%  Weight: 125 lb 3.2 oz (  56.8 kg)  Height: 5\' 4"  (1.626 m)   Gen: Debilitated ill-appearing man, mild dyspnea, in a wheelchair  ENT: No lesions,  mouth clear, Upper airway noise sounds like secretions that he is having difficulty clearing/managing  Neck: No JVD, no TMG, no carotid bruits  Lungs: No use of accessory muscles, Very distant bilaterally, no wheezing  Cardiovascular: RRR, heart sounds normal, no murmur or gallops, no peripheral edema  Musculoskeletal: No deformities, no cyanosis or clubbing  Neuro: alert, non focal  Skin: dusky       Assessment & Plan:  COPD exacerbation (HCC) Acute exacerbation. He does not appear to have a viral syndrome, I suspect that this is due to increased rhinitis and allergy season. It does not take much to tip him over into a bronchitis. I will treat him with doxycycline since he does not tolerate Levaquin (not a true allergy).  Please take prednisone as directed, then stay on your usual 10mg  daily after Take doxycycline 100mg  twice a day for 7 days.  Continue your albuterol / ipratropium nebulizers every 6 hours.  You will need to get the flu shot after we clear this flare-up.  Continue your oxygen at 3L/min at all times.  We will repeat your CT chest in 1 year.  Follow with Dr Lamonte Sakai in 4 months or sooner if you have any problems.  Pulmonary nodules No evidence of hypermetabolism and some clearance of his dominant nodule on most recent PET scan 08/2016. He may have mycobacterial colonization. Given his fragile state I'm hesitant to perform  bronchoscopy for cultures unless he clinically changes. We will plan to repeat his CT scan of the chest in one year, October 2018.  Baltazar Apo, MD, PhD 09/16/2016, 9:45 AM [facility] [phone] or if no answer [phone]

## 2016-09-16 NOTE — Patient Instructions (Signed)
Please take prednisone as directed, then stay on your usual 10mg  daily after Take doxycycline 100mg  twice a day for 7 days.  Continue your albuterol / ipratropium nebulizers every 6 hours.  You will need to get the flu shot after we clear this flare-up.  Continue your oxygen at 3L/min at all times.  We will repeat your CT chest in 1 year.  Follow with Dr Lamonte Sakai in 4 months or sooner if you have any problems.

## 2016-10-23 ENCOUNTER — Encounter (HOSPITAL_COMMUNITY): Payer: Self-pay | Admitting: *Deleted

## 2016-10-23 ENCOUNTER — Emergency Department (HOSPITAL_COMMUNITY): Payer: Medicare Other

## 2016-10-23 ENCOUNTER — Inpatient Hospital Stay (HOSPITAL_COMMUNITY): Payer: Medicare Other

## 2016-10-23 ENCOUNTER — Inpatient Hospital Stay (HOSPITAL_COMMUNITY)
Admission: EM | Admit: 2016-10-23 | Discharge: 2016-11-10 | DRG: 296 | Disposition: E | Payer: Medicare Other | Attending: Pulmonary Disease | Admitting: Pulmonary Disease

## 2016-10-23 DIAGNOSIS — J9601 Acute respiratory failure with hypoxia: Secondary | ICD-10-CM | POA: Diagnosis present

## 2016-10-23 DIAGNOSIS — I252 Old myocardial infarction: Secondary | ICD-10-CM

## 2016-10-23 DIAGNOSIS — I480 Paroxysmal atrial fibrillation: Secondary | ICD-10-CM | POA: Diagnosis present

## 2016-10-23 DIAGNOSIS — G9340 Encephalopathy, unspecified: Secondary | ICD-10-CM

## 2016-10-23 DIAGNOSIS — Z881 Allergy status to other antibiotic agents status: Secondary | ICD-10-CM | POA: Diagnosis not present

## 2016-10-23 DIAGNOSIS — J939 Pneumothorax, unspecified: Secondary | ICD-10-CM

## 2016-10-23 DIAGNOSIS — E1151 Type 2 diabetes mellitus with diabetic peripheral angiopathy without gangrene: Secondary | ICD-10-CM | POA: Diagnosis present

## 2016-10-23 DIAGNOSIS — K219 Gastro-esophageal reflux disease without esophagitis: Secondary | ICD-10-CM | POA: Diagnosis not present

## 2016-10-23 DIAGNOSIS — M1990 Unspecified osteoarthritis, unspecified site: Secondary | ICD-10-CM | POA: Diagnosis not present

## 2016-10-23 DIAGNOSIS — D72829 Elevated white blood cell count, unspecified: Secondary | ICD-10-CM | POA: Diagnosis present

## 2016-10-23 DIAGNOSIS — Z9981 Dependence on supplemental oxygen: Secondary | ICD-10-CM

## 2016-10-23 DIAGNOSIS — Z794 Long term (current) use of insulin: Secondary | ICD-10-CM | POA: Diagnosis not present

## 2016-10-23 DIAGNOSIS — I959 Hypotension, unspecified: Secondary | ICD-10-CM | POA: Diagnosis present

## 2016-10-23 DIAGNOSIS — Z95828 Presence of other vascular implants and grafts: Secondary | ICD-10-CM

## 2016-10-23 DIAGNOSIS — J930 Spontaneous tension pneumothorax: Secondary | ICD-10-CM | POA: Diagnosis present

## 2016-10-23 DIAGNOSIS — Z79899 Other long term (current) drug therapy: Secondary | ICD-10-CM

## 2016-10-23 DIAGNOSIS — G894 Chronic pain syndrome: Secondary | ICD-10-CM | POA: Diagnosis present

## 2016-10-23 DIAGNOSIS — Z7901 Long term (current) use of anticoagulants: Secondary | ICD-10-CM | POA: Diagnosis not present

## 2016-10-23 DIAGNOSIS — R4189 Other symptoms and signs involving cognitive functions and awareness: Secondary | ICD-10-CM

## 2016-10-23 DIAGNOSIS — J9602 Acute respiratory failure with hypercapnia: Secondary | ICD-10-CM | POA: Diagnosis present

## 2016-10-23 DIAGNOSIS — R570 Cardiogenic shock: Secondary | ICD-10-CM | POA: Diagnosis not present

## 2016-10-23 DIAGNOSIS — Z87891 Personal history of nicotine dependence: Secondary | ICD-10-CM | POA: Diagnosis not present

## 2016-10-23 DIAGNOSIS — G931 Anoxic brain damage, not elsewhere classified: Secondary | ICD-10-CM | POA: Diagnosis present

## 2016-10-23 DIAGNOSIS — Z7982 Long term (current) use of aspirin: Secondary | ICD-10-CM

## 2016-10-23 DIAGNOSIS — J438 Other emphysema: Secondary | ICD-10-CM | POA: Diagnosis present

## 2016-10-23 DIAGNOSIS — Z8249 Family history of ischemic heart disease and other diseases of the circulatory system: Secondary | ICD-10-CM

## 2016-10-23 DIAGNOSIS — I2510 Atherosclerotic heart disease of native coronary artery without angina pectoris: Secondary | ICD-10-CM | POA: Diagnosis present

## 2016-10-23 DIAGNOSIS — J449 Chronic obstructive pulmonary disease, unspecified: Secondary | ICD-10-CM | POA: Diagnosis present

## 2016-10-23 DIAGNOSIS — I469 Cardiac arrest, cause unspecified: Principal | ICD-10-CM | POA: Diagnosis present

## 2016-10-23 DIAGNOSIS — E872 Acidosis: Secondary | ICD-10-CM | POA: Diagnosis present

## 2016-10-23 DIAGNOSIS — E785 Hyperlipidemia, unspecified: Secondary | ICD-10-CM | POA: Diagnosis present

## 2016-10-23 DIAGNOSIS — I701 Atherosclerosis of renal artery: Secondary | ICD-10-CM | POA: Diagnosis present

## 2016-10-23 DIAGNOSIS — J9690 Respiratory failure, unspecified, unspecified whether with hypoxia or hypercapnia: Secondary | ICD-10-CM

## 2016-10-23 LAB — CBC
HEMATOCRIT: 42.1 % (ref 39.0–52.0)
HEMOGLOBIN: 11.9 g/dL — AB (ref 13.0–17.0)
MCH: 23.5 pg — AB (ref 26.0–34.0)
MCHC: 28.3 g/dL — AB (ref 30.0–36.0)
MCV: 83.2 fL (ref 78.0–100.0)
Platelets: 292 10*3/uL (ref 150–400)
RBC: 5.06 MIL/uL (ref 4.22–5.81)
RDW: 16.5 % — ABNORMAL HIGH (ref 11.5–15.5)
WBC: 16.3 10*3/uL — ABNORMAL HIGH (ref 4.0–10.5)

## 2016-10-23 LAB — POCT I-STAT 3, ART BLOOD GAS (G3+)
ACID-BASE DEFICIT: 4 mmol/L — AB (ref 0.0–2.0)
Bicarbonate: 25.6 mmol/L (ref 20.0–28.0)
O2 Saturation: 62 %
PCO2 ART: 70.4 mmHg — AB (ref 32.0–48.0)
PO2 ART: 42 mmHg — AB (ref 83.0–108.0)
TCO2: 28 mmol/L (ref 0–100)
pH, Arterial: 7.169 — CL (ref 7.350–7.450)

## 2016-10-23 LAB — URINALYSIS, ROUTINE W REFLEX MICROSCOPIC
Bilirubin Urine: NEGATIVE
GLUCOSE, UA: 150 mg/dL — AB
Ketones, ur: 5 mg/dL — AB
Leukocytes, UA: NEGATIVE
Nitrite: NEGATIVE
PROTEIN: 100 mg/dL — AB
SPECIFIC GRAVITY, URINE: 1.015 (ref 1.005–1.030)
pH: 6 (ref 5.0–8.0)

## 2016-10-23 LAB — BLOOD GAS, ARTERIAL
ACID-BASE EXCESS: 1.8 mmol/L (ref 0.0–2.0)
BICARBONATE: 26.1 mmol/L (ref 20.0–28.0)
DRAWN BY: 277331
FIO2: 1
LHR: 14 {breaths}/min
MECHVT: 500 mL
O2 SAT: 98 %
PEEP/CPAP: 5 cmH2O
PH ART: 7.438 (ref 7.350–7.450)
Patient temperature: 37
pCO2 arterial: 38.5 mmHg (ref 32.0–48.0)
pO2, Arterial: 112 mmHg — ABNORMAL HIGH (ref 83.0–108.0)

## 2016-10-23 LAB — COMPREHENSIVE METABOLIC PANEL
ALK PHOS: 73 U/L (ref 38–126)
ALT: 26 U/L (ref 17–63)
AST: 31 U/L (ref 15–41)
Albumin: 3.8 g/dL (ref 3.5–5.0)
Anion gap: 9 (ref 5–15)
BILIRUBIN TOTAL: 0.6 mg/dL (ref 0.3–1.2)
BUN: 13 mg/dL (ref 6–20)
CALCIUM: 8.6 mg/dL — AB (ref 8.9–10.3)
CO2: 33 mmol/L — AB (ref 22–32)
CREATININE: 0.87 mg/dL (ref 0.61–1.24)
Chloride: 94 mmol/L — ABNORMAL LOW (ref 101–111)
Glucose, Bld: 155 mg/dL — ABNORMAL HIGH (ref 65–99)
Potassium: 4.5 mmol/L (ref 3.5–5.1)
Sodium: 136 mmol/L (ref 135–145)
TOTAL PROTEIN: 7.8 g/dL (ref 6.5–8.1)

## 2016-10-23 LAB — MRSA PCR SCREENING: MRSA BY PCR: NEGATIVE

## 2016-10-23 LAB — LACTIC ACID, PLASMA: Lactic Acid, Venous: 1.1 mmol/L (ref 0.5–1.9)

## 2016-10-23 LAB — I-STAT CG4 LACTIC ACID, ED: LACTIC ACID, VENOUS: 1.02 mmol/L (ref 0.5–1.9)

## 2016-10-23 LAB — INFLUENZA PANEL BY PCR (TYPE A & B)
INFLAPCR: NEGATIVE
Influenza B By PCR: NEGATIVE

## 2016-10-23 LAB — I-STAT TROPONIN, ED: TROPONIN I, POC: 0 ng/mL (ref 0.00–0.08)

## 2016-10-23 LAB — TROPONIN I: Troponin I: 0.13 ng/mL (ref <0.03)

## 2016-10-23 MED ORDER — POVIDONE-IODINE 10 % EX SOLN
CUTANEOUS | Status: DC
Start: 2016-10-23 — End: 2016-10-24
  Filled 2016-10-23: qty 118

## 2016-10-23 MED ORDER — CALCIUM CHLORIDE 10 % IV SOLN
INTRAVENOUS | Status: AC | PRN
  Administered 2016-10-23: 1 g via INTRAVENOUS

## 2016-10-23 MED ORDER — VASOPRESSIN 20 UNIT/ML IV SOLN
0.0300 [IU]/min | INTRAVENOUS | Status: DC
  Filled 2016-10-23: qty 2

## 2016-10-23 MED ORDER — FENTANYL CITRATE (PF) 100 MCG/2ML IJ SOLN: 50.0000 ug | Freq: Once | INTRAMUSCULAR | Status: DC

## 2016-10-23 MED ORDER — MIDAZOLAM BOLUS VIA INFUSION
2.0000 mg | INTRAVENOUS | Status: DC | PRN
  Filled 2016-10-23: qty 2

## 2016-10-23 MED ORDER — SODIUM BICARBONATE 8.4 % IV SOLN
INTRAVENOUS | Status: AC | PRN
  Administered 2016-10-23: 50 meq via INTRAVENOUS

## 2016-10-23 MED ORDER — HEPARIN (PORCINE) IN NACL 100-0.45 UNIT/ML-% IJ SOLN: 700.0000 [IU]/h | INTRAMUSCULAR | Status: DC

## 2016-10-23 MED ORDER — FENTANYL CITRATE (PF) 100 MCG/2ML IJ SOLN: 100.0000 ug | Freq: Once | INTRAMUSCULAR | Status: DC

## 2016-10-23 MED ORDER — MIDAZOLAM HCL 2 MG/2ML IJ SOLN: 2.0000 mg | INTRAMUSCULAR | Status: DC | PRN

## 2016-10-23 MED ORDER — SODIUM BICARBONATE 8.4 % IV SOLN
INTRAVENOUS | Status: DC
  Filled 2016-10-23 (×2): qty 150

## 2016-10-23 MED ORDER — EPINEPHRINE PF 1 MG/10ML IJ SOSY
PREFILLED_SYRINGE | INTRAMUSCULAR | Status: AC | PRN
  Administered 2016-10-23 (×5): 1 mg via INTRAVENOUS

## 2016-10-23 MED ORDER — NOREPINEPHRINE BITARTRATE 1 MG/ML IV SOLN
0.0000 ug/min | INTRAVENOUS | Status: DC
  Filled 2016-10-23: qty 16

## 2016-10-23 MED ORDER — PROPOFOL 1000 MG/100ML IV EMUL
INTRAVENOUS | Status: AC
  Filled 2016-10-23: qty 100

## 2016-10-23 MED ORDER — SODIUM CHLORIDE 0.9 % IV BOLUS (SEPSIS)
1000.0000 mL | Freq: Once | INTRAVENOUS | Status: AC
Start: 2016-10-23 — End: 2016-10-23
  Administered 2016-10-23: 1000 mL via INTRAVENOUS

## 2016-10-23 MED ORDER — INSULIN ASPART 100 UNIT/ML ~~LOC~~ SOLN: 0.0000 [IU] | SUBCUTANEOUS | Status: DC

## 2016-10-23 MED ORDER — CISATRACURIUM BOLUS VIA INFUSION: 0.1000 mg/kg | Freq: Once | INTRAVENOUS | Status: DC

## 2016-10-23 MED ORDER — SODIUM CHLORIDE 0.9 % IV SOLN
25.0000 ug/h | INTRAVENOUS | Status: DC
  Administered 2016-10-23: 50 ug/h via INTRAVENOUS
  Filled 2016-10-23: qty 50

## 2016-10-23 MED ORDER — FENTANYL CITRATE (PF) 100 MCG/2ML IJ SOLN
INTRAMUSCULAR | Status: AC
  Filled 2016-10-23: qty 2

## 2016-10-23 MED ORDER — NALOXONE HCL 2 MG/2ML IJ SOSY
PREFILLED_SYRINGE | INTRAMUSCULAR | Status: AC
  Administered 2016-10-23: 2 mg
  Filled 2016-10-23: qty 2

## 2016-10-23 MED ORDER — SODIUM CHLORIDE 0.9 % IV SOLN: 1.0000 ug/kg/min | INTRAVENOUS | Status: DC

## 2016-10-23 MED ORDER — NOREPINEPHRINE BITARTRATE 1 MG/ML IV SOLN
0.0000 ug/min | INTRAVENOUS | Status: DC
  Filled 2016-10-23: qty 4

## 2016-10-23 MED ORDER — FENTANYL BOLUS VIA INFUSION
50.0000 ug | INTRAVENOUS | Status: DC | PRN
  Filled 2016-10-23: qty 50

## 2016-10-23 MED ORDER — LIDOCAINE HCL (CARDIAC) 20 MG/ML IV SOLN
INTRAVENOUS | Status: AC | PRN
  Administered 2016-10-23: 100 mg via INTRAVENOUS

## 2016-10-23 MED ORDER — NOREPINEPHRINE BITARTRATE 1 MG/ML IV SOLN
0.0000 ug/min | INTRAVENOUS | Status: DC
  Administered 2016-10-23: 2 ug/min via INTRAVENOUS
  Filled 2016-10-23: qty 4

## 2016-10-23 MED ORDER — CISATRACURIUM BOLUS VIA INFUSION: 0.0500 mg/kg | INTRAVENOUS | Status: DC | PRN

## 2016-10-23 MED ORDER — PIPERACILLIN-TAZOBACTAM 3.375 G IVPB 30 MIN
3.3750 g | Freq: Once | INTRAVENOUS | Status: AC
  Administered 2016-10-23: 3.375 g via INTRAVENOUS
  Filled 2016-10-23: qty 50

## 2016-10-23 MED ORDER — ASPIRIN 300 MG RE SUPP: 300.0000 mg | RECTAL | Status: DC

## 2016-10-23 MED ORDER — FENTANYL CITRATE (PF) 100 MCG/2ML IJ SOLN
50.0000 ug | Freq: Once | INTRAMUSCULAR | Status: AC
  Administered 2016-10-23: 50 ug via INTRAVENOUS

## 2016-10-23 MED ORDER — ARTIFICIAL TEARS OP OINT: 1.0000 "application " | TOPICAL_OINTMENT | Freq: Three times a day (TID) | OPHTHALMIC | Status: DC

## 2016-10-23 MED ORDER — DEXTROSE 5 % IV SOLN
0.0000 ug/min | INTRAVENOUS | Status: DC
  Filled 2016-10-23: qty 4

## 2016-10-23 MED ORDER — HEPARIN BOLUS VIA INFUSION
2500.0000 [IU] | Freq: Once | INTRAVENOUS | Status: DC
  Filled 2016-10-23: qty 2500

## 2016-10-23 MED ORDER — MIDAZOLAM HCL 2 MG/2ML IJ SOLN: 2.0000 mg | Freq: Once | INTRAMUSCULAR | Status: DC

## 2016-10-23 MED ORDER — MIDAZOLAM HCL 2 MG/2ML IJ SOLN
2.0000 mg | Freq: Once | INTRAMUSCULAR | Status: AC
  Administered 2016-10-23: 2 mg via INTRAVENOUS

## 2016-10-23 MED ORDER — SODIUM CHLORIDE 0.9 % IV SOLN
100.0000 ug/h | INTRAVENOUS | Status: DC
  Filled 2016-10-23: qty 50

## 2016-10-23 MED ORDER — MIDAZOLAM HCL 2 MG/2ML IJ SOLN
INTRAMUSCULAR | Status: AC
  Administered 2016-10-23: 2 mg via INTRAVENOUS
  Filled 2016-10-23: qty 2

## 2016-10-23 MED ORDER — MIDAZOLAM HCL 2 MG/2ML IJ SOLN
2.0000 mg | INTRAMUSCULAR | Status: DC | PRN
Start: 2016-10-23 — End: 2016-10-24

## 2016-10-23 MED ORDER — SODIUM CHLORIDE 0.9 % IV SOLN: 2.0000 mg/h | INTRAVENOUS | Status: DC

## 2016-10-23 MED ORDER — VANCOMYCIN HCL IN DEXTROSE 1-5 GM/200ML-% IV SOLN
1000.0000 mg | Freq: Once | INTRAVENOUS | Status: AC
  Administered 2016-10-23: 1000 mg via INTRAVENOUS
  Filled 2016-10-23: qty 200

## 2016-10-23 MED ORDER — SODIUM CHLORIDE 0.9 % IV SOLN: INTRAVENOUS | Status: DC

## 2016-10-23 MED ORDER — IPRATROPIUM-ALBUTEROL 0.5-2.5 (3) MG/3ML IN SOLN: 3.0000 mL | Freq: Four times a day (QID) | RESPIRATORY_TRACT | Status: DC

## 2016-10-23 MED ORDER — FAMOTIDINE IN NACL 20-0.9 MG/50ML-% IV SOLN: 20.0000 mg | Freq: Two times a day (BID) | INTRAVENOUS | Status: DC

## 2016-10-23 MED FILL — Medication: Qty: 1 | Status: AC

## 2016-10-25 LAB — URINE CULTURE: CULTURE: NO GROWTH

## 2016-10-27 MED FILL — Medication: Qty: 1 | Status: AC

## 2016-10-28 LAB — CULTURE, BLOOD (ROUTINE X 2)
Culture: NO GROWTH
Culture: NO GROWTH

## 2016-10-29 MED FILL — Ondansetron HCl Inj 4 MG/2ML (2 MG/ML): INTRAMUSCULAR | Qty: 2 | Status: AC

## 2016-10-31 NOTE — Discharge Summary (Signed)
Kenneth Merritt, Kenneth Merritt NO.:  [id number]  MEDICAL RECORD NO.:  [id number]  LOCATION:                                FACILITY:  [facility]  PHYSICIAN:  Providence Lanius, MD  DATE OF BIRTH:  1950-12-03  DATE OF ADMISSION:  November 20, 2016 DATE OF DISCHARGE:  Nov 20, 2016                              DISCHARGE SUMMARY   DEATH SUMMARY  PRIMARY DIAGNOSIS/CAUSE OF DEATH:  Pulseless electric activity cardiac arrest.  SECONDARY DIAGNOSES:  Respiratory failure, pneumothorax, subcutaneous emphysema, respiratory failure, anoxic brain injury.  The patient is a 65 year old smoker who presented to hospital with PEA cardiac arrest.  Patient was resuscitated, __________ pneumothorax, chest tube was placed.  The patient developed __________ shock, worse with expiration and while positioning to address the pneumothorax the patient __________ pulseless electric activity cardiac arrest.  The patient was coded, see code sheet for details.  However after 25 minutes of resuscitation, the patient failed to make any improvement and was declared dead.  The family was notified __________.     Providence Lanius, MD     WJY/MEDQ  D:  10/30/2016  T:  10/30/2016  Job:  [id number]

## 2016-10-31 NOTE — Discharge Summary (Deleted)
  The note originally documented on this encounter has been moved the the encounter in which it belongs.  

## 2016-11-06 ENCOUNTER — Telehealth: Payer: Self-pay

## 2016-11-06 NOTE — Telephone Encounter (Signed)
On 11/04/2016 I received a death certificate from [facility] (original). The death certificate is for cremation. The patient is a patient of Doctor Nelda Marseille. The death certificate will be taken to [facility] [id number] today for signature. On 06-Nov-2016 I received the death certificate back from Doctor Nelda Marseille. I got the death certificate ready and called the funeral home to let them know the death certificate is ready for pickup. I also faxed a copy to the funeral home per the funeral home

## 2016-11-10 NOTE — Progress Notes (Addendum)
Discussed patient with ER staff Dr Dayna Barker. Patient with history of CAD from cath 2010 managed medically, PAF, COPD, HL, aortobifemoral bypass.  Cath 2010 as reported below. Presented unresponsive, agonal breathing. PEA arrest in ER now intubated. EKG concerning for ST elevations in aVR and aVL, diffuse ST depressions. Lead I poorly visualized. Pattern worrisome for lateral MI vs proximal LAD disease with diffuse ischemia. Will repeat EKG. ER staff working to stabilize patient, best course of action is STEMI activation and transfer to [facility] once medically stable to travel. Would not consider thrombolytics at this point. ER staff is to discuss with cath attending at [facility]. Patient currently intubated, hypertensive after epi doses, receiving chest tube for PTX. Use of oral anticoagulant at home and recent traumatic resuscitation would be relative contraindications to thrombolytics, best course of action would be transfer for PCI.    CONCLUSION:  1. Coronary artery disease status post recent non-ST-elevation MI with      70-80% narrowing in the proximal LAD, no significant obstruction in      circumflex artery, 30% narrowing in the proximal right coronary      artery and normal LV function.  2. 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   RECOMMENDATIONS:  I think the patient will need surgery for his totally  occluded aorta, and we will plan vascular consultation.  I do not think  intervention on the LAD will make this surgery any safer, but I think we  may consider intervention after his surgical procedure assuming this is  what is recommended and done.   Zandra Abts MD

## 2016-11-10 NOTE — ED Provider Notes (Signed)
[facility] [place] DEPT Provider Note   CSN: [id number] Arrival date & time: November 09, 2016  1016  By signing my name below, I, Rayna Sexton, attest that this documentation has been prepared under the direction and in the presence of Merrily Pew, MD. Electronically Signed: Rayna Sexton, ED Scribe. 2016/11/09. 3:18 PM.   History   Chief Complaint Chief Complaint  Patient presents with  . Respiratory Distress    LEVEL FIVE CAVEAT: UNRESPONSIVE  HPI HPI Comments: Kenneth Merritt is a 66 y.o. male who presents to the Emergency Department by ambulance due to respiratory distress onset this morning. Per EMS, pt was found unresponsive by his wife. Per EMS, his daughter stated that she was with him last night around 6:00 PM and he was complaining of "not feeling good". Per EMS, pt was exhibiting agonal breathing with O2 saturation of 52% on arrival which increased to 98% with use of a BVM. His CBG was 147 upon arrival, BP 131/84 mmHg and was tachycardic. He has a h/o DM and COPD and is a COPD GOLD patient. EMS did not attempt IV or intubation en route.   The history is provided by medical records and the EMS personnel. The history is limited by the condition of the patient. No language interpreter was used.    Past Medical History:  Diagnosis Date  . Atrial fibrillation (HCC)    post-op a-fib. 3/11. post Aortobifem , /  Atrial fibrillation from nebulizer treatment  May, 2012, while hospitalized  . CAD (coronary artery disease), native coronary artery    catheterization 06/2009.Marland KitchenMarland Kitchen70% proximal LAD and 30% proximal RCA. normal LV function, no intervention planned prior to surgery for aorta. LV normal function  by catheter 8/10.   . Cancer [facility]) 2013   Prostate Radiation  . Chronic pain syndrome   . Chronic thoracic back pain   . COPD (chronic obstructive pulmonary disease) ([place])    severe by PFTs August 2010  . DJD (degenerative joint disease)   . Dyslipidemia   . Ejection fraction    60%, echo,  May, 2012, rapid atrial fib at the time  . GERD (gastroesophageal reflux disease)   . Myocardial infarction 2011  . On home O2    2L N/C  . Peripheral vascular disease, unspecified    bilateral intermittent claudication  . Pneumothorax     2011  before aortobifemoral surgery,  resolved  . Renal artery stenosis ([place])     presumably corrected at time of aortobifem surgery March, 2011  . 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 Tobacco user     stop smoking    Patient Active Problem List   Diagnosis Date Noted  . Cardiac arrest ([place]) 11/09/16  . Hypomagnesemia 07/25/2016  . Atypical chest pain 07/24/2016  . Chest pain 07/24/2016  . CAP (community acquired pneumonia) 07/24/2016  . Hypokalemia 07/24/2016  . Anemia due to other cause 07/24/2016  . Chronic respiratory failure with hypoxia ([place]) 07/24/2016  . DM (diabetes mellitus) with complications ([place]) [id number]  . Pulmonary nodules 06/04/2016  . Malnutrition of moderate degree 05/13/2016  . SOB (shortness of breath) 05/10/2016  . Aspiration into airway   . Altered mental status 04/23/2016  . Dyspnea   . Hyponatremia 04/22/2016  . Noninfectious gastroenteritis and colitis 04/22/2016  . Protein-calorie malnutrition, severe 04/22/2016  . Atrial fibrillation with RVR ([place]) 04/21/2016  . Abdominal distention   .  Elevated troponin 04/08/2016  . HCAP (healthcare-associated pneumonia) 04/06/2016  . COPD with acute exacerbation ([place]) 01/08/2016  . Paroxysmal a-fib ([place]) 01/08/2016  . Atrial fibrillation with rapid ventricular response ([place]) 10/02/2015  . COPD exacerbation ([place]) 09/18/2015  . Chronic respiratory failure ([place]) 09/18/2015  . COPD with exacerbation ([place]) 07/03/2015  . On prednisone therapy 04/14/2014  . HX: anticoagulation 04/13/2014  . Acute respiratory failure ([place]) 11/29/2013  . Leukocytosis 11/24/2013  . BRBPR (bright red  blood per rectum) 11/22/2013  . PAF (paroxysmal atrial fibrillation) ([place]) 11/22/2013  . GERD (gastroesophageal reflux disease)   . COPD (chronic obstructive pulmonary disease) ([place])   . Pneumothorax   . Peripheral vascular disease, unspecified   . Dyslipidemia   . DJD (degenerative joint disease)   . S/P aortobifemoral bypass surgery   . Renal artery stenosis ([place])   . Tobacco user   . CAD (coronary artery disease), native coronary artery   . Ejection fraction     Past Surgical History:  Procedure Laterality Date  . ABDOMINAL AORTIC ANEURYSM REPAIR  01/11/2010  . INGUINAL HERNIA REPAIR     RIGHT  . PR VEIN BYPASS GRAFT,AORTO-FEM-POP         Home Medications    Prior to Admission medications   Medication Sig Start Date End Date Taking? Authorizing Provider  albuterol (PROVENTIL HFA;VENTOLIN HFA) 108 (90 BASE) MCG/ACT inhaler Inhale 2 puffs into the lungs every 4 (four) hours as needed for wheezing or shortness of breath. 09/19/15  Yes Donne [name], MD  albuterol (PROVENTIL) (2.5 MG/3ML) 0.083% nebulizer solution Take 3 mLs (2.5 mg total) by nebulization 3 (three) times daily. 07/27/16  Yes Rexene Alberts, MD  ALPRAZolam [name]) 0.25 MG tablet Take 0.25 mg by mouth 3 (three) times daily as needed for anxiety or sleep.  12/17/15  Yes Historical Provider, MD  apixaban (ELIQUIS) 5 MG TABS tablet Take 1 tablet (5 mg total) by mouth 2 (two) times daily. 08/19/16  Yes Arnoldo Lenis, MD  aspirin EC 81 MG EC tablet Take 1 tablet (81 mg total) by mouth daily. 07/28/16  Yes Rexene Alberts, MD  atorvastatin (LIPITOR) 20 MG tablet Take 1 tablet (20 mg total) by mouth daily at 6 PM. 05/01/16  Yes Hosie Poisson, MD  diltiazem (DILACOR XR) 240 MG 24 hr capsule Take 1 capsule (240 mg total) by mouth daily. Patient taking differently: Take 180 mg by mouth daily.  07/27/16  Yes Rexene Alberts, MD  doxycycline (VIBRA-TABS) 100 MG tablet Take 1 tablet (100 mg total) by mouth 2 (two) times daily. 09/16/16   Yes Collene Gobble, MD  ferrous sulfate 325 (65 FE) MG tablet Take 1 tablet (325 mg total) by mouth daily with breakfast. Iron supplement to treat your anemia. 07/28/16  Yes Rexene Alberts, MD  HYDROcodone-acetaminophen (NORCO/VICODIN) 5-325 MG tablet Take 1-2 tablets by mouth every 6 (six) hours as needed for moderate pain or severe pain. 04/15/16  Yes Domenic Polite, MD  ipratropium (ATROVENT) 0.02 % nebulizer solution Take 2.5 mLs (0.5 mg total) by nebulization 3 (three) times daily. 07/27/16  Yes Rexene Alberts, MD  omeprazole (PRILOSEC) 20 MG capsule Take 20 mg by mouth every morning.  04/21/11  Yes Historical Provider, MD  predniSONE (DELTASONE) 10 MG tablet Take 10 mg by mouth daily.   Yes Historical Provider, MD  gabapentin (NEURONTIN) 300 MG capsule Take 1 capsule (300 mg total) by mouth at bedtime. 07/27/16   Rexene Alberts, MD  insulin aspart (NOVOLOG) 100 UNIT/ML FlexPen  Inject into the skin. Sliding scale.    Historical Provider, MD  nitroGLYCERIN (NITROSTAT) 0.4 MG SL tablet Place 1 tablet (0.4 mg total) under the tongue every 5 (five) minutes x 3 doses as needed for chest pain. 06/11/15   [name], MD    Family History Family History  Problem Relation Age of Onset  . Cancer Father     prostate  . Heart attack Father     Social History Social History  Substance Use Topics  . Smoking status: Former Smoker    Packs/day: 2.00    Years: 40.00    Types: Cigarettes    Quit date: 01/11/2010  . Smokeless tobacco: Never Used  . Alcohol use No     Allergies   Levaquin [levofloxacin in d5w]   Review of Systems Review of Systems  Unable to perform ROS: Patient unresponsive   Physical Exam Updated Vital Signs BP 105/62   Pulse 108   Temp (!) 95.4 F (35.2 C)   Resp 22   SpO2 94%   Physical Exam  Constitutional: He appears well-developed. He appears distressed.  HENT:  Head: Normocephalic and atraumatic.  Eyes: Pupils are equal, round, and reactive to light.  Pupils 4  mm bilaterally and reactive but sluggish   Cardiovascular: Tachycardia present.   No murmur heard. Pulmonary/Chest: He is in respiratory distress.  Use of abdominal muscles with respirations   Abdominal: Soft. He exhibits no distension. There is no tenderness. There is no guarding.  Musculoskeletal: He exhibits no edema or deformity.  Neurological:  Not following commands and is non verbal.    Skin: Skin is warm and dry.  Nursing note and vitals reviewed.  ED Treatments / Results  Labs (all labs ordered are listed, but only abnormal results are displayed) Labs Reviewed  BLOOD GAS, ARTERIAL - Abnormal; Notable for the following:       Result Value   pO2, Arterial 112 (*)    All other components within normal limits  TROPONIN I - Abnormal; Notable for the following:    Troponin I 0.13 (*)    All other components within normal limits  CBC - Abnormal; Notable for the following:    WBC 16.3 (*)    Hemoglobin 11.9 (*)    MCH 23.5 (*)    MCHC 28.3 (*)    RDW 16.5 (*)    All other components within normal limits  COMPREHENSIVE METABOLIC PANEL - Abnormal; Notable for the following:    Chloride 94 (*)    CO2 33 (*)    Glucose, Bld 155 (*)    Calcium 8.6 (*)    All other components within normal limits  URINALYSIS, ROUTINE W REFLEX MICROSCOPIC - Abnormal; Notable for the following:    APPearance HAZY (*)    Glucose, UA 150 (*)    Hgb urine dipstick MODERATE (*)    Ketones, ur 5 (*)    Protein, ur 100 (*)    Bacteria, UA RARE (*)    All other components within normal limits  URINE CULTURE  LACTIC ACID, PLASMA  INFLUENZA PANEL BY PCR (TYPE A & B, H1N1)  AMMONIA  LACTIC ACID, PLASMA  I-STAT CG4 LACTIC ACID, ED  I-STAT TROPOININ, ED    EKG  EKG Interpretation None       Radiology Ct Head Wo Contrast  Result Date: 2016-11-18 CLINICAL DATA:  Found unresponsive, low oxygen saturation, respiratory distress on ventilator EXAM: CT HEAD WITHOUT CONTRAST TECHNIQUE: Contiguous  axial images  were obtained from the base of the skull through the vertex without intravenous contrast. COMPARISON:  None. FINDINGS: Brain: The ventricular system is prominent as are the cortical sulci indicative of diffuse atrophy. The septum is midline in position. Mild to moderate small vessel ischemic changes noted throughout the periventricular white matter. No hemorrhage, mass lesion, or acute infarction is seen. Vascular: No vascular abnormality is seen on this unenhanced study. Skull: No calvarial abnormality is seen. However, as seen on these images there is considerable subcutaneous air throughout the soft tissues of the face and neck. Some periorbital air also is noted on the right. No fracture is seen. Sinuses/Orbits: There is mucosal edema throughout the ethmoid air cells. The remainder of the paranasal sinuses appear clear. The frontal sinuses are underdeveloped. Other: None IMPRESSION: 1. Atrophy and moderate small vessel ischemic change throughout the periventricular white matter. No acute intracranial abnormality. 2. Subcutaneous air throughout the soft tissues of the face and neck. Also, right periorbital emphysema is noted. Consider chest x-ray. 3. Mucosal edema in the ethmoid air cells. Electronically Signed   By: Ivar Drape M.D.   On: 11-11-2016 14:20   Dg Chest Portable 1 View  Result Date: 11-Nov-2016 CLINICAL DATA:  Respiratory difficulty EXAM: PORTABLE CHEST 1 VIEW COMPARISON:  1100 hours FINDINGS: Left chest tube as been placed. The left pneumothorax has markedly improved. There is a 10% residual left apical pneumothorax. Left subclavian central venous catheter place. Tip is in the lower SVC. Subcutaneous emphysema over the left chest and in the supraclavicular regions has increased. Right lung is hyperaerated and clear. Persistent opacity in the left mid and lower lung zone likely represents volume loss. Normal heart size. Endotracheal and NG tubes stable. There is free intraperitoneal  gas under both hemidiaphragms. IMPRESSION: Left chest tube placed.  Left pneumothorax improved. Left subclavian central venous catheter.  Tip is in the SVC. There is free intraperitoneal gas. This may be related to the pneumothorax. Bowel rupture is not excluded. Critical Value/emergent results were called by telephone at the time of interpretation on Nov 11, 2016 at 12:13 pm to Dr. Merrily Pew , who verbally acknowledged these results. Electronically Signed   By: Marybelle Killings M.D.   On: 2016/11/11 12:13   Dg Chest Portable 1 View  Result Date: November 11, 2016 CLINICAL DATA:  Cardiopulmonary resuscitation. Shortness of breath. Unresponsive. EXAM: PORTABLE CHEST 1 VIEW COMPARISON:  07/26/2016 FINDINGS: Endotracheal tube has its tip 3 cm above the carina. Nasogastric tube enters the abdomen. There is a left pneumothorax, 50-60%. Right chest is clear. Extensive subcutaneous emphysema. Probable early tension. I do not see a rib fracture but there is a lot of overlying artifact. IMPRESSION: Pneumothorax on the left, 50-60%, probably with early tension. Critical Value/emergent results were called by telephone at the time of interpretation on 11-Nov-2016 at 11:09 am to Dr. Merrily Pew , who verbally acknowledged these results. Electronically Signed   By: Nelson Chimes M.D.   On: 11-11-16 11:13    Procedures  Cardiopulmonary Resuscitation (CPR) Procedure Note Directed/Performed by: Merrily Pew I personally directed ancillary staff and/or performed CPR in an effort to regain return of spontaneous circulation and to maintain cardiac, neuro and systemic perfusion.    Procedure Name: Intubation Date/Time: 11-11-16 10:30 AM Performed by: Merrily Pew Pre-anesthesia Checklist: Patient identified Oxygen Delivery Method: Ambu bag Preoxygenation: Pre-oxygenation with 100% oxygen Intubation Type: Rapid sequence Ventilation: Mask ventilation without difficulty Laryngoscope Size: 4 and Mac Grade View: Grade  I Tube type: Subglottic suction tube Tube  size: 7.5 mm Number of attempts: 1 Airway Equipment and Method: Patient positioned with wedge pillow and Stylet Placement Confirmation: ETT inserted through vocal cords under direct vision Secured at: 24 cm Tube secured with: ETT holder Future Recommendations: Recommend- induction with short-acting agent, and alternative techniques readily available     Needle decompression Date/Time: 10-30-2016 3:19 PM Performed by: Merrily Pew Authorized by: Merrily Pew  Consent: The procedure was performed in an emergent situation. Preparation: Patient was prepped and draped in the usual sterile fashion. Local anesthesia used: no  Anesthesia: Local anesthesia used: no  Sedation: Patient sedated: no Patient tolerance: Patient tolerated the procedure well with no immediate complications Comments: Betadine was applied to the left chest approximately mid clavicular line around the area of the third intercostal space. A 14-gauge needle with Angiocath was inserted was needle was removed there is a loud gush of air and improvement in his ability to ventilate the patient via endotracheal tube. This was used temporarily until the chest tube could be placed.  CHEST TUBE INSERTION Date/Time: 10/30/2016 3:20 PM Performed by: Merrily Pew Authorized by: Merrily Pew   Consent:    Consent obtained:  Emergent situation Pre-procedure details:    Skin preparation:  Betadine   Preparation: Patient was prepped and draped in the usual sterile fashion   Anesthesia (see MAR for exact dosages):    Anesthesia method:  None Procedure details:    Placement location:  L lateral   Scalpel size:  10   Tube size (Fr):  24   Dissection instrument:  Finger and Kelly clamp   Ultrasound guidance: no     Tension pneumothorax: yes     Tube connected to:  Suction and water seal   Drainage characteristics:  Bloody   Suture material:  0 silk   Dressing:   Petrolatum-impregnated gauze Post-procedure details:    Post-insertion x-ray findings: tube in good position     Patient tolerance of procedure:  Tolerated well, no immediate complications .Central Line Date/Time: 2016/10/30 3:21 PM Performed by: Merrily Pew Authorized by: Merrily Pew   Consent:    Consent obtained:  Emergent situation Pre-procedure details:    Hand hygiene: Hand hygiene performed prior to insertion     Sterile barrier technique: All elements of maximal sterile technique followed     Skin preparation:  Betadine   Skin preparation agent: Skin preparation agent completely dried prior to procedure   Anesthesia (see MAR for exact dosages):    Anesthesia method:  None Procedure details:    Location:  L subclavian   Site selection rationale:  Already had chest tube on that side    Patient position:  Flat   Procedural supplies:  Triple lumen   Landmarks identified: yes     Ultrasound guidance: no     Number of attempts:  1   Successful placement: yes   Post-procedure details:    Post-procedure:  Dressing applied and line sutured   Assessment:  Blood return through all ports, placement verified by x-ray and free fluid flow (no new pneumothorax on XR, previous PTX improved significantly)   Patient tolerance of procedure:  Tolerated well, no immediate complications    CRITICAL CARE Performed by: Merrily Pew, MD Total critical care time: 75 minutes Critical care time was exclusive of separately billable procedures and treating other patients. Critical care was necessary to treat or prevent imminent or life-threatening deterioration. Critical care was time spent personally by me on the following activities: development of treatment plan  with patient and/or surrogate as well as nursing, discussions with consultants, evaluation of patient's response to treatment, examination of patient, obtaining history from patient or surrogate, ordering and performing treatments and  interventions, ordering and review of laboratory studies, ordering and review of radiographic studies, pulse oximetry and re-evaluation of patient's condition.    EMERGENCY DEPARTMENT [place] CARDIAC EXAM "Study: Limited Ultrasound of the heart and pericardium"  INDICATIONS:Cardiac arrest Multiple views of the heart and pericardium are obtained with a multi-frequency probe.  PERFORMED [id number]  IMAGES ARCHIVED?: No  FINDINGS: No pericardial effusion, Hyperdynamic contractility and IVC flat  LIMITATIONS:  Subcutaneous air  VIEWS USED: Subcostal 4 chamber  INTERPRETATION: Cardiac activity present, Pericardial effusioin absent, Cardiac tamponade absent, Probable low CVP and Increased contractility  COMMENT:  No obvious regional wall motion abnormalities      COORDINATION OF CARE: 10:35 AM Pt brought in via EMS and is unresponsive. Will move forward with next steps based on provided history and exam findings.     Medications Ordered in ED Medications  propofol (DIPRIVAN) 1000 MG/100ML infusion (not administered)  EPINEPHrine (ADRENALIN) 1 MG/10ML injection (1 mg Intravenous Given 11/20/2016 1115)  sodium bicarbonate injection (50 mEq Intravenous Given 20-Nov-2016 1048)  lidocaine (cardiac) 100 mg/38ml (XYLOCAINE) 20 MG/ML injection 2% (100 mg Intravenous Given 11-20-2016 1050)  calcium chloride injection (1 g Intravenous Given 11/20/2016 1050)  povidone-iodine (BETADINE) 10 % external solution (not administered)  norepinephrine (LEVOPHED) 4 mg in dextrose 5 % 250 mL (0.016 mg/mL) infusion (35 mcg/min Intravenous Rate/Dose Change 11-20-2016 1447)  fentaNYL (SUBLIMAZE) injection 50 mcg (not administered)  fentaNYL (SUBLIMAZE) 2,500 mcg in sodium chloride 0.9 % 250 mL (10 mcg/mL) infusion (75 mcg/hr Intravenous Rate/Dose Change 2016-11-20 1506)  fentaNYL (SUBLIMAZE) bolus via infusion 50 mcg (not administered)  midazolam (VERSED) injection 2 mg (not administered)  midazolam (VERSED) injection 2 mg  (not administered)  naloxone (NARCAN) 2 MG/2ML injection (2 mg  Given by Other 11/20/2016 1027)  vancomycin (VANCOCIN) IVPB 1000 mg/200 mL premix (1,000 mg Intravenous New Bag/Given 11/20/16 1353)  piperacillin-tazobactam (ZOSYN) IVPB 3.375 g (0 g Intravenous Stopped 2016/11/20 1425)  sodium chloride 0.9 % bolus 1,000 mL (1,000 mLs Intravenous New Bag/Given 2016/11/20 1349)  midazolam (VERSED) injection 2 mg (2 mg Intravenous Given 20-Nov-2016 1348)  fentaNYL (SUBLIMAZE) injection 50 mcg (50 mcg Intravenous Given 11-20-16 1348)   Initial Impression / Assessment and Plan / ED Course  I have reviewed the triage vital signs and the nursing notes.  Pertinent labs & imaging results that were available during my care of the patient were reviewed by me and considered in my medical decision making (see chart for details).  Clinical Course     Patient arrived here for unresponsiveness. Per EMS report the patient was found unresponsive by his family and on fire department arrival had O2 sats in the low 50s. The patient was bagged and his sats improved to 100% but still was unresponsive so brought here for further evaluation. Here the patient initially was unresponsive with 4 mm pupils that were equal and reactive. Sats are still in the high 90s but had a GCS of 3. Had diminished breath sounds bilaterally and tachycardic with normal blood pressure. Patient was intubated for airway protection and for hypoxic respiratory failure.  History EKG showed sinus tachycardia with a poor baseline. Approximately 10 minutes after intubation the patient's pulse started to drop and we were unable to pick up an oxygen saturation. Was not able to palpate a pulse so chest compressions  started. After one round of chest compressions and a dose of epinephrine the patient returned to spontaneous circulation.  A repeat EKG was done, read by me, showing ST elevation in aVR and aVL and diffuse ST depression and all the precordial leads. A  code STEMI was called as this could've been the cause of his symptoms however due to his instability was not transported directly to the cath lab. I discussed the case with on-call cardiologist, Dr. Julianne Handler, who did not feel thrombolytics could easily be decided over a phone call and recommended having our cardiologist here help with that decision, I discussed with Dr. Harl Bowie who said he would see patient but still felt getting him stable and transferred to [facility] would be his best bet. Shortly afterwards the patient lost pulses again. This time when chest compressions were started he had progressively increasing abdominal distention. glidescope was used to verify placement of the ET tube and to attempt to get help with placing an NG tube but did not get much air of NG tube. After 2 more rounds of compressions, epinephrine, calcium, lidocaine patient had return of spontaneous circulation. A chest x-ray was done which showed a large left pneumothorax by my interpretation so needled decompression done with the large amount of air release. Shortly afterwards the patient lost pulses again but regained quickly with one round of chest compressions and epinephrine. A chest tube was placed as above. Central line was placed as above and patient darted on Levophed. Repeat chest x-ray reviewed and interpreted by myself showed improved lung parenchyma with the chest due in appropriate position and ET tube and central line in appropriate positions. He now has free air under diaphragms with this is more likely related to his pneumothorax rather than a primarily bowel perforation as he did not have a distended abdomen on arrival. After all these interventions the patient became more stable on the ventilator, chest tube and Levothroid. Discussed the case with our ICU and felt that if head CT was negative which should call the patient he probably would need a catheterization to ensure that this was not cardiac in origin. After  looking at his CT scan myself I verified he did not have head bleed and hypothermia protocol was initiated patient will be transferred to [facility] for further evaluation and management.  At this time I am sure of the initial call was restrained distress. He may have had a pneumothorax initially but did not have tension as his blood pressure was normal. He also may have had hypoxic respiratory failure secondary to COPD or some primary lung infection so started on abx.   I personally performed the services described in this documentation, which was scribed in my presence. The recorded information has been reviewed and is accurate.   Final Clinical Impressions(s) / ED Diagnoses   Final diagnoses:  Acute respiratory failure with hypoxia and hypercapnia (HCC)  Cardiac arrest (HCC)  Hypotension, unspecified hypotension type  Pneumothorax, unspecified type  Unresponsive    New Prescriptions Current Discharge Medication List       Merrily Pew, MD Oct 27, 2016 1539

## 2016-11-10 NOTE — ED Notes (Signed)
Pt found to be in PEA. Chest compressions started at this time.

## 2016-11-10 NOTE — H&P (Signed)
PULMONARY / CRITICAL CARE MEDICINE   Name: Kenneth Merritt MRN: [id number] DOB: May 21, 1951    ADMISSION DATE:  2016-11-17  REFERRING MD:  EDP (AP)   CHIEF COMPLAINT:  PEA arrest   HISTORY OF PRESENT ILLNESS:   66yo male smoker with hx AFib on eliquis, known CAD (medically managed), COPD, chronic pain presented to [facility] ER 12/14 unresponsive with agonal breathing. No response to narcan.  Ultimately had PEA arrest with multiple rounds of CPR totaling 20-25 mins.  EKG concerning for ST elevation in aVR and aVL, diffuse ST depressions.  Post CPR pt had L tension ptx, chest tube placed in ER.  Pt seen briefly by cardiology who recommended tx to [facility] for cath, possible hypothermia protocol and PCCM asked to admit.    PAST MEDICAL HISTORY :  He  has a past medical history of Atrial fibrillation ([place]); CAD (coronary artery disease), native coronary artery; Cancer [facility]) (2013); Chronic pain syndrome; Chronic thoracic back pain; COPD (chronic obstructive pulmonary disease) (HCC); DJD (degenerative joint disease); Dyslipidemia; Ejection fraction; GERD (gastroesophageal reflux disease); Myocardial infarction (2011); On home O2; Peripheral vascular disease, unspecified; Pneumothorax; Renal artery stenosis ([place]); S/P aortobifemoral bypass surgery; and Tobacco user.  PAST SURGICAL HISTORY: He  has a past surgical history that includes Abdominal aortic aneurysm repair (01/11/2010); pr vein bypass graft,aorto-fem-pop; and Inguinal hernia repair.  Allergies  Allergen Reactions  . Levaquin [Levofloxacin In D5w] Other (See Comments)    Pt states it caused dry mouth and rectal bleeding.     No current facility-administered medications on file prior to encounter.    Current Outpatient Prescriptions on File Prior to Encounter  Medication Sig  . albuterol (PROVENTIL HFA;VENTOLIN HFA) 108 (90 BASE) MCG/ACT inhaler Inhale 2 puffs into the lungs every 4 (four) hours as needed for wheezing or shortness of  breath.  [name] albuterol (PROVENTIL) (2.5 MG/3ML) 0.083% nebulizer solution Take 3 mLs (2.5 mg total) by nebulization 3 (three) times daily.  [name] ALPRAZolam (XANAX) 0.25 MG tablet Take 0.25 mg by mouth 3 (three) times daily as needed for anxiety or sleep.   [name] apixaban (ELIQUIS) 5 MG TABS tablet Take 1 tablet (5 mg total) by mouth 2 (two) times daily.  [name] aspirin EC 81 MG EC tablet Take 1 tablet (81 mg total) by mouth daily.  [name] atorvastatin (LIPITOR) 20 MG tablet Take 1 tablet (20 mg total) by mouth daily at 6 PM.  . diltiazem (DILACOR XR) 240 MG 24 hr capsule Take 1 capsule (240 mg total) by mouth daily. (Patient taking differently: Take 180 mg by mouth daily. )  . doxycycline (VIBRA-TABS) 100 MG tablet Take 1 tablet (100 mg total) by mouth 2 (two) times daily.  . ferrous sulfate 325 (65 FE) MG tablet Take 1 tablet (325 mg total) by mouth daily with breakfast. Iron supplement to treat your anemia.  [name] HYDROcodone-acetaminophen (NORCO/VICODIN) 5-325 MG tablet Take 1-2 tablets by mouth every 6 (six) hours as needed for moderate pain or severe pain.  [name] ipratropium (ATROVENT) 0.02 % nebulizer solution Take 2.5 mLs (0.5 mg total) by nebulization 3 (three) times daily.  [name] omeprazole (PRILOSEC) 20 MG capsule Take 20 mg by mouth every morning.   . predniSONE (DELTASONE) 10 MG tablet Take 10 mg by mouth daily.  [name] gabapentin (NEURONTIN) 300 MG capsule Take 1 capsule (300 mg total) by mouth at bedtime.  . insulin aspart (NOVOLOG) 100 UNIT/ML FlexPen Inject into the skin. Sliding scale.  . nitroGLYCERIN (NITROSTAT) 0.4 MG SL  tablet Place 1 tablet (0.4 mg total) under the tongue every 5 (five) minutes x 3 doses as needed for chest pain.    FAMILY HISTORY:  His indicated that his mother is deceased. He indicated that his father is deceased.    SOCIAL HISTORY: He  reports that he quit smoking about 6 years ago. His smoking use included Cigarettes. He has a 80.00 pack-year smoking history. He has never used smokeless  tobacco. He reports that he does not drink alcohol or use drugs.  REVIEW OF SYSTEMS:   Unable.  As per HPI obtained from records and staff.   SUBJECTIVE:    VITAL SIGNS: BP 105/62   Pulse 108   Temp (!) 95.4 F (35.2 C)   Resp 22   SpO2 94%   HEMODYNAMICS:    VENTILATOR SETTINGS: Vent Mode: PRVC FiO2 (%):  [100 %] 100 % Set Rate:  [14 bmp] 14 bmp Vt Set:  [500 mL] 500 mL PEEP:  [5 cmH20] 5 cmH20 Plateau Pressure:  [24 cmH20] 24 cmH20  INTAKE / OUTPUT: No intake/output data recorded.  PHYSICAL EXAMINATION: General:  Chronically ill appearing male, critically ill  Neuro:  Arouses to loud voice, follows simple commands, MAE  HEENT:  Mm moist, ETT, extensive SQ air  Cardiovascular:  s1s2 irreg Lungs:  resps even, labored on vent, L chest tube with massive airleak, coarse breath sounds L, minimal breath sounds R Abdomen:  Round, distended, hypoactive bs  Musculoskeletal:  Cool, mottled BLE, no edema    LABS:  BMET  Recent Labs Lab 29-Oct-2016 1029  NA 136  K 4.5  CL 94*  CO2 33*  BUN 13  CREATININE 0.87  GLUCOSE 155*    Electrolytes  Recent Labs Lab 10-29-16 1029  CALCIUM 8.6*    CBC  Recent Labs Lab 10-29-16 1029  WBC 16.3*  HGB 11.9*  HCT 42.1  PLT 292    Coag's No results for input(s): APTT, INR in the last 168 hours.  Sepsis Markers  Recent Labs Lab 2016/10/29 1027 Oct 29, 2016 1029  LATICACIDVEN 1.02 1.1    ABG  Recent Labs Lab 10-29-2016 1230 2016-10-29 1621  PHART 7.438 7.169*  PCO2ART 38.5 70.4*  PO2ART 112* 42.0*    Liver Enzymes  Recent Labs Lab 10/29/2016 1029  AST 31  ALT 26  ALKPHOS 73  BILITOT 0.6  ALBUMIN 3.8    Cardiac Enzymes  Recent Labs Lab 10-29-2016 1029  TROPONINI 0.13*    Glucose No results for input(s): GLUCAP in the last 168 hours.  Imaging Ct Head Wo Contrast  Result Date: 10-29-16 CLINICAL DATA:  Found unresponsive, low oxygen saturation, respiratory distress on ventilator EXAM: CT HEAD  WITHOUT CONTRAST TECHNIQUE: Contiguous axial images were obtained from the base of the skull through the vertex without intravenous contrast. COMPARISON:  None. FINDINGS: Brain: The ventricular system is prominent as are the cortical sulci indicative of diffuse atrophy. The septum is midline in position. Mild to moderate small vessel ischemic changes noted throughout the periventricular white matter. No hemorrhage, mass lesion, or acute infarction is seen. Vascular: No vascular abnormality is seen on this unenhanced study. Skull: No calvarial abnormality is seen. However, as seen on these images there is considerable subcutaneous air throughout the soft tissues of the face and neck. Some periorbital air also is noted on the right. No fracture is seen. Sinuses/Orbits: There is mucosal edema throughout the ethmoid air cells. The remainder of the paranasal sinuses appear clear. The frontal sinuses are underdeveloped. Other:  None IMPRESSION: 1. Atrophy and moderate small vessel ischemic change throughout the periventricular white matter. No acute intracranial abnormality. 2. Subcutaneous air throughout the soft tissues of the face and neck. Also, right periorbital emphysema is noted. Consider chest x-ray. 3. Mucosal edema in the ethmoid air cells. Electronically Signed   By: Ivar Drape M.D.   On: 11-01-2016 14:20   Dg Chest Portable 1 View  Result Date: 11-01-2016 CLINICAL DATA:  Respiratory difficulty EXAM: PORTABLE CHEST 1 VIEW COMPARISON:  1100 hours FINDINGS: Left chest tube as been placed. The left pneumothorax has markedly improved. There is a 10% residual left apical pneumothorax. Left subclavian central venous catheter place. Tip is in the lower SVC. Subcutaneous emphysema over the left chest and in the supraclavicular regions has increased. Right lung is hyperaerated and clear. Persistent opacity in the left mid and lower lung zone likely represents volume loss. Normal heart size. Endotracheal and NG  tubes stable. There is free intraperitoneal gas under both hemidiaphragms. IMPRESSION: Left chest tube placed.  Left pneumothorax improved. Left subclavian central venous catheter.  Tip is in the SVC. There is free intraperitoneal gas. This may be related to the pneumothorax. Bowel rupture is not excluded. Critical Value/emergent results were called by telephone at the time of interpretation on 11/01/16 at 12:13 pm to Dr. Merrily Pew , who verbally acknowledged these results. Electronically Signed   By: Marybelle Killings M.D.   On: 2016/11/01 12:13   Dg Chest Portable 1 View  Result Date: 2016/11/01 CLINICAL DATA:  Cardiopulmonary resuscitation. Shortness of breath. Unresponsive. EXAM: PORTABLE CHEST 1 VIEW COMPARISON:  07/26/2016 FINDINGS: Endotracheal tube has its tip 3 cm above the carina. Nasogastric tube enters the abdomen. There is a left pneumothorax, 50-60%. Right chest is clear. Extensive subcutaneous emphysema. Probable early tension. I do not see a rib fracture but there is a lot of overlying artifact. IMPRESSION: Pneumothorax on the left, 50-60%, probably with early tension. Critical Value/emergent results were called by telephone at the time of interpretation on 01-Nov-2016 at 11:09 am to Dr. Merrily Pew , who verbally acknowledged these results. Electronically Signed   By: Nelson Chimes M.D.   On: 11/01/16 11:13     STUDIES:  CT head 12/14>>> 1. Atrophy and moderate small vessel ischemic change throughout the periventricular white matter. No acute intracranial abnormality. 2. Subcutaneous air throughout the soft tissues of the face and neck. Also, right periorbital emphysema is noted. Consider chest x-ray.  3. Mucosal edema in the ethmoid air cells.  CULTURES: Urine 12/14>>>  ANTIBIOTICS:   SIGNIFICANT EVENTS: 12/14 admit, PEA arrest, L ptx with extensive SQ emphysema, 20-25 mins CPR   LINES/TUBES: ETT 12/14 (AP) >>> L Chest tube  (AP) 12/14>>> L SQ CVL  (AP)  12/14>>>  DISCUSSION: 66yo male with AFib, hx CAD, COPD with PEA arrest.   ASSESSMENT / PLAN:  PULMONARY Acute respiratory failure - post PEA arrest  Large L ptx with sub q emphysema  Respiratory acidosis  Underlying severe COPD  P:   Vent support - 8cc/kg  F/u CXR  F/u ABG  Continue chest tube to suction  Stat CXR now r/o R ptx  Increase RR 22 F/u ABG  Ensure no autopeep - increase sedation  BD's   CARDIOVASCULAR PEA arrest - ?STEMI Hx CAD  AFib - on eliquis  Shock  P:  Cardiology to see - ?PCI - need to stabilize respiratory status first  Trend troponin  Stat EKG now  Stat echo  Heparin gtt  per pharmacy for ?ACS  Stat lactate, pct  Monitor CVPs  Add neo, vaso   RENAL No active issue  P:   Monitor renal function closely post arrest   GASTROINTESTINAL No active issue  P:   NPO  PPI   HEMATOLOGIC Leukocytosis  Anemia - mild  P:  F/u cbc   INFECTIOUS Leukocytosis - no obvious source infection  P:   F/u cbc  Pan culture  F/u pct   ENDOCRINE Hyperglycemia - no known hx DM  P:   SSI   NEUROLOGIC AMS - post cardiac arrest.  Now with improved mentation, following some commands.  P:   RASS goal: -2 No hypothermia protocol for now with good mentation  Fentanyl, versed gtts  May need paralytic for vent synchrony - hold off for now    FAMILY  - Updates:  No family at bedside 12/14 on arrival   - Inter-disciplinary family meet or Palliative Care meeting due by:  12/21   Nickolas Madrid, NP 2016/11/04  4:26 PM Pager: [phone] or [phone]  Attending Note:  66 year old male with PMH significant for severe COPD and CAD who presented to [place] post arrest with a chest tube for left sided PTX and cardiogenic shock.  Upon arrival, patient was severely hypotensive and with significant difficulty with oxygenation.  SubQ air noted diffusely and severe leak noted on CT.  After positioning the patient the patient had another PEA arrest.  CXR  with no PTX noted.  Via U/S, there is sliding lung sign on both lungs and no tamponade on echo as well.  After prolonged resuscitation PEA persisted and patient was declared dead and family was notified.  Please see code sheet for details.  The patient is critically ill with multiple organ systems failure and requires high complexity decision making for assessment and support, frequent evaluation and titration of therapies, application of advanced monitoring technologies and extensive interpretation of multiple databases.   Critical Care Time devoted to patient care services described in this note is  90  Minutes. This time reflects time of care of this signee Dr Jennet Maduro. This critical care time does not reflect procedure time, or teaching time or supervisory time of PA/NP/Med student/Med Resident etc but could involve care discussion time.  Rush Farmer, M.D. [facility] Pulmonary/Critical Care Medicine. Pager: [phone]. After hours pager: [phone].

## 2016-11-10 NOTE — ED Notes (Signed)
Pulses felt, HR 143.

## 2016-11-10 NOTE — ED Notes (Signed)
14 gauge IV catheter inserted into left upper chest by Dr. Dayna Barker. Air releasing from chest at this time.

## 2016-11-10 NOTE — ED Notes (Signed)
No response from Narcan at this time. Pt still unresponsive.

## 2016-11-10 NOTE — ED Notes (Signed)
Pulses felt, HR 134.

## 2016-11-10 NOTE — ED Notes (Signed)
Pulses felt, HR 165.

## 2016-11-10 NOTE — ED Notes (Signed)
Pulses felt, HR 145.

## 2016-11-10 NOTE — ED Notes (Signed)
CRITICAL VALUE ALERT  Critical value received:  Troponin 0.13  Date of notification:  2016-10-29  Time of notification:  [id number]  Critical value read back:  Yes  Nurse who received alert:  Cena Benton RN  MD notified (1st page):  Dr. Dolly Rias  Time of first page:  [phone]

## 2016-11-10 NOTE — ED Notes (Signed)
Narcan 2mg  IV given by Dr. Dayna Barker in right a/c iv.

## 2016-11-10 NOTE — Progress Notes (Signed)
ANTICOAGULATION CONSULT NOTE - Initial Consult  Pharmacy Consult:  Heparin Indication: chest pain/ACS  Allergies  Allergen Reactions  . Levaquin [Levofloxacin In D5w] Other (See Comments)    Pt states it caused dry mouth and rectal bleeding.     Patient Measurements: Weight: 125 lb 3.5 oz (56.8 kg) Heparin Dosing Weight: 57 kg  Vital Signs: Temp: 95.4 F (35.2 C) (12/14 1432) BP: 105/62 (12/14 1507) Pulse Rate: 108 (12/14 1507)  Labs:  Recent Labs  Oct 31, 2016 1029  HGB 11.9*  HCT 42.1  PLT 292  CREATININE 0.87  TROPONINI 0.13*    Estimated Creatinine Clearance: 68.9 mL/min (by C-G formula based on SCr of 0.87 mg/dL).   Medical History: Past Medical History:  Diagnosis Date  . Atrial fibrillation (HCC)    post-op a-fib. 3/11. post Aortobifem , /  Atrial fibrillation from nebulizer treatment  May, 2012, while hospitalized  . CAD (coronary artery disease), native coronary artery    catheterization 06/2009.Marland KitchenMarland Kitchen70% proximal LAD and 30% proximal RCA. normal LV function, no intervention planned prior to surgery for aorta. LV normal function  by catheter 8/10.   . Cancer [facility]) 2013   Prostate Radiation  . Chronic pain syndrome   . Chronic thoracic back pain   . COPD (chronic obstructive pulmonary disease) ([place])    severe by PFTs August 2010  . DJD (degenerative joint disease)   . Dyslipidemia   . Ejection fraction    60%, echo, May, 2012, rapid atrial fib at the time  . GERD (gastroesophageal reflux disease)   . Myocardial infarction 2011  . On home O2    2L N/C  . Peripheral vascular disease, unspecified    bilateral intermittent claudication  . Pneumothorax     2011  before aortobifemoral surgery,  resolved  . Renal artery stenosis ([place])     presumably corrected at time of aortobifem surgery March, 2011  . 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 Tobacco user     stop smoking       Assessment: 43 YOM presented to [facility] ED unresponsive, had PEA arrest and then CPR for 20-25 minutes before ROSC.  EKG concerning for STEMI and patient was transferred to [facility] for cath.  Pharmacy consulted to initiate IV heparin for ACS.  Noted patient is on Eliquis PTA and his last dose was on 10/22/16 at 1900.  Available baseline labs reviewed.   Goal of Therapy:  Heparin level 0.3-0.7 units/ml aPTT 66 - 102 seconds Monitor platelets by anticoagulation protocol: Yes    Plan:  - Heparin 2500 units IV bolus x 1, then - Heparin gtt at 700 units/hr - Check 8 hr heparin level and aPTT - Daily heparin level, aPTT, CBC    Kendrik Mcshan D. Mina Marble, PharmD, BCPS Pager:  [phone] 10-31-16, 4:51 PM

## 2016-11-10 NOTE — ED Notes (Signed)
HR 163 on monitor, pulses not felt. Chest compression started.

## 2016-11-10 NOTE — ED Notes (Signed)
Chest compressions restarted

## 2016-11-10 NOTE — ED Notes (Signed)
Pulses lost, chest compressions felt.

## 2016-11-10 NOTE — Progress Notes (Signed)
**Note De-Identified [place] Obfuscation** ABG not collected due to decrease in BP

## 2016-11-10 NOTE — ED Notes (Signed)
Chest tube placed on left side by Dr. Dayna Barker.

## 2016-11-10 NOTE — ED Notes (Addendum)
Ice packs placed on pt's bilateral groin and auxillary regions by [facility] paramedic.

## 2016-11-10 NOTE — ED Notes (Signed)
Amidate 30mg  IV and Rocuronium 100mg  IV given at this time by Penni Bombard, RN in rt a/c.

## 2016-11-10 NOTE — Progress Notes (Signed)
   Responded to code blue.   Introduced Art gallery manager.  Will follow, as needed.   Rev. [place] MDiv ThM

## 2016-11-10 NOTE — ED Notes (Signed)
[facility] at bedside 

## 2016-11-10 NOTE — Procedures (Signed)
CPR Note  PEA cardiac arrest, underwent resuscitation, sliding lung and no tamponade noted, see code sheet for details.    Rush Farmer, M.D. [facility] Pulmonary/Critical Care Medicine. Pager: [phone]. After hours pager: [phone].

## 2016-11-10 NOTE — ED Triage Notes (Addendum)
Pt was found unresponsive upon waking, O2 sats 66% RA. CBG 147. Last seen normal around 1900. Diaphoretic per EMS. O2 sats 98% while EMS using ambu bag. HR 116 on monitor. Breathing agonal.

## 2016-11-10 NOTE — ED Notes (Signed)
Respiratory at bedside, pt being bagged with ambu.

## 2016-11-10 DEATH — deceased

## 2017-01-15 ENCOUNTER — Ambulatory Visit: Payer: Medicare Other | Admitting: Emergency Medicine

## 2017-02-18 IMAGING — DX DG CHEST 1V PORT
1 series · 1 of 1 positions shown · non-contrast
Comparison: 04/13/2016

CLINICAL DATA: Dyspnea for 1 hour

EXAM:
PORTABLE CHEST 1 VIEW

[chest ap]
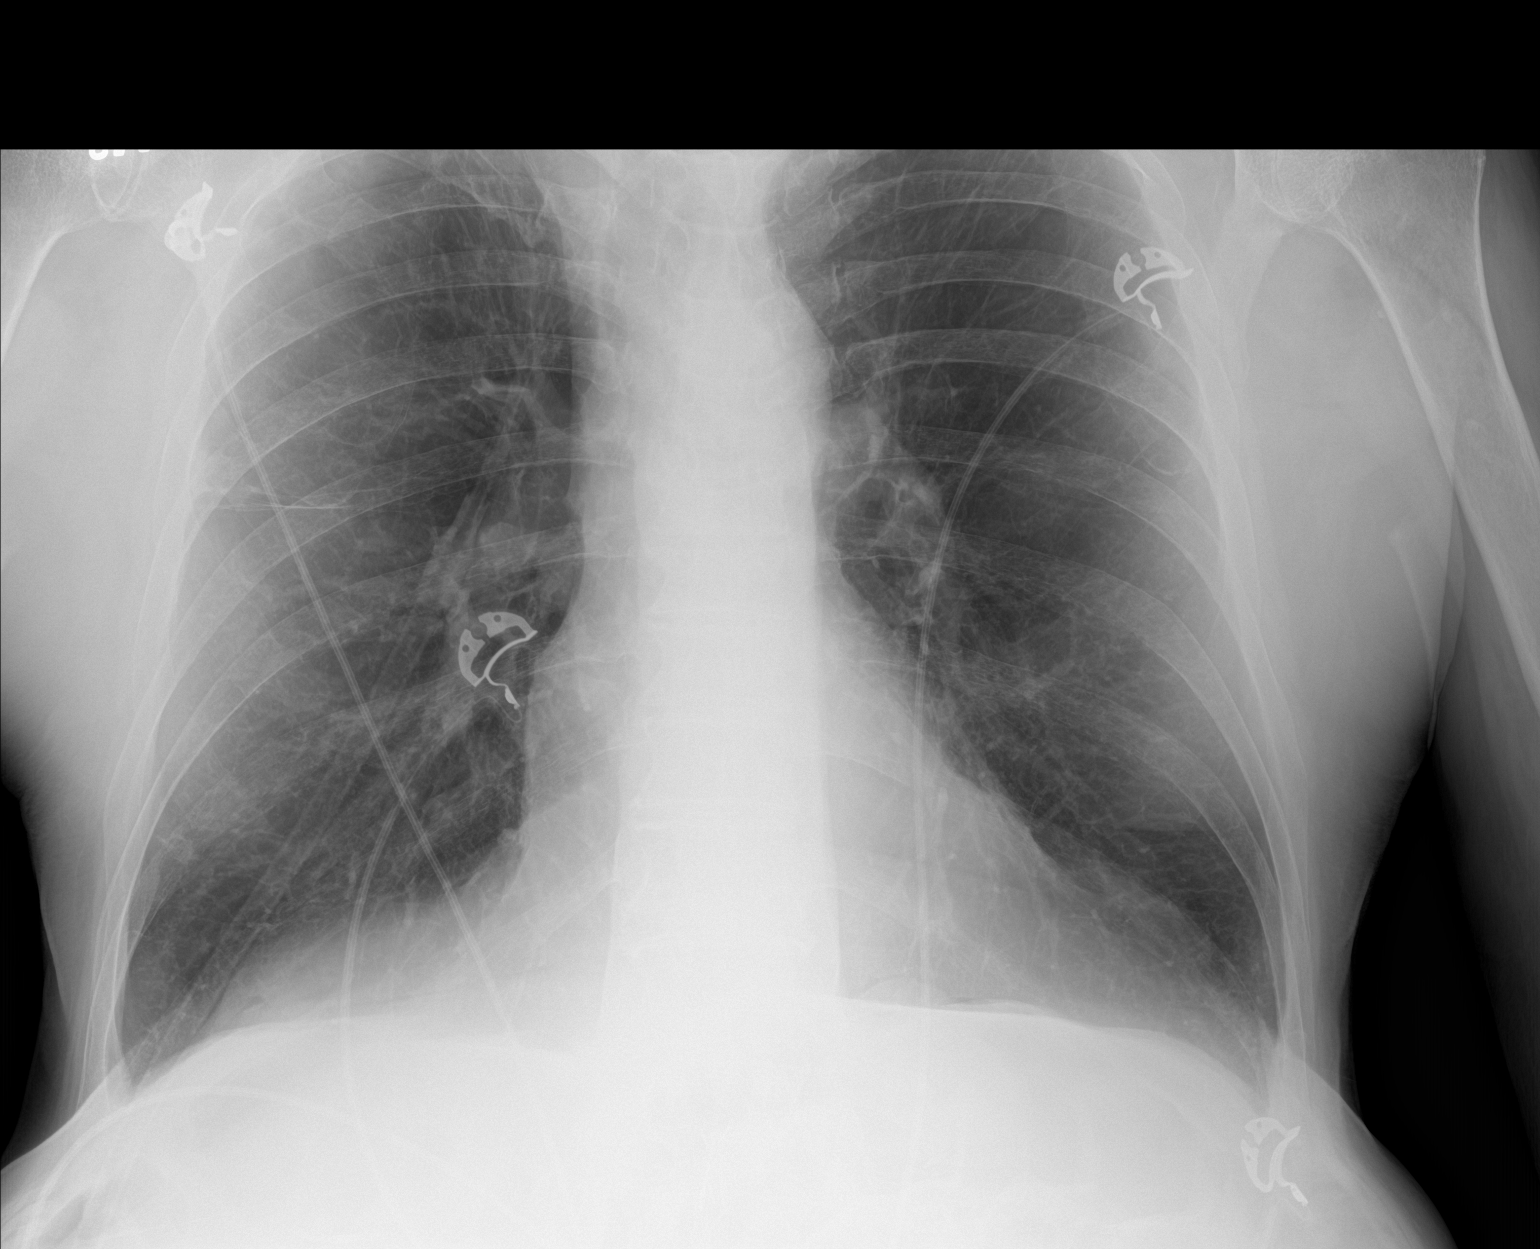

[1 of 1 positions shown; findings below may reference images not displayed]

FINDINGS: Advanced emphysema and biapical scarring. No evidence of acute
superimposed pneumonia or edema. No effusion or pneumothorax (linear
lucency at both bases attributed of Mach effect). Normal heart size
and mediastinal contours.
IMPRESSION: Emphysema and lung scarring without acute superimposed finding.

## 2017-02-26 IMAGING — DX DG CHEST 1V PORT
1 series · 1 of 1 positions shown · non-contrast
Comparison: 04/13/2016

CLINICAL DATA: Dyspnea

EXAM:
PORTABLE CHEST 1 VIEW

[chest ap]
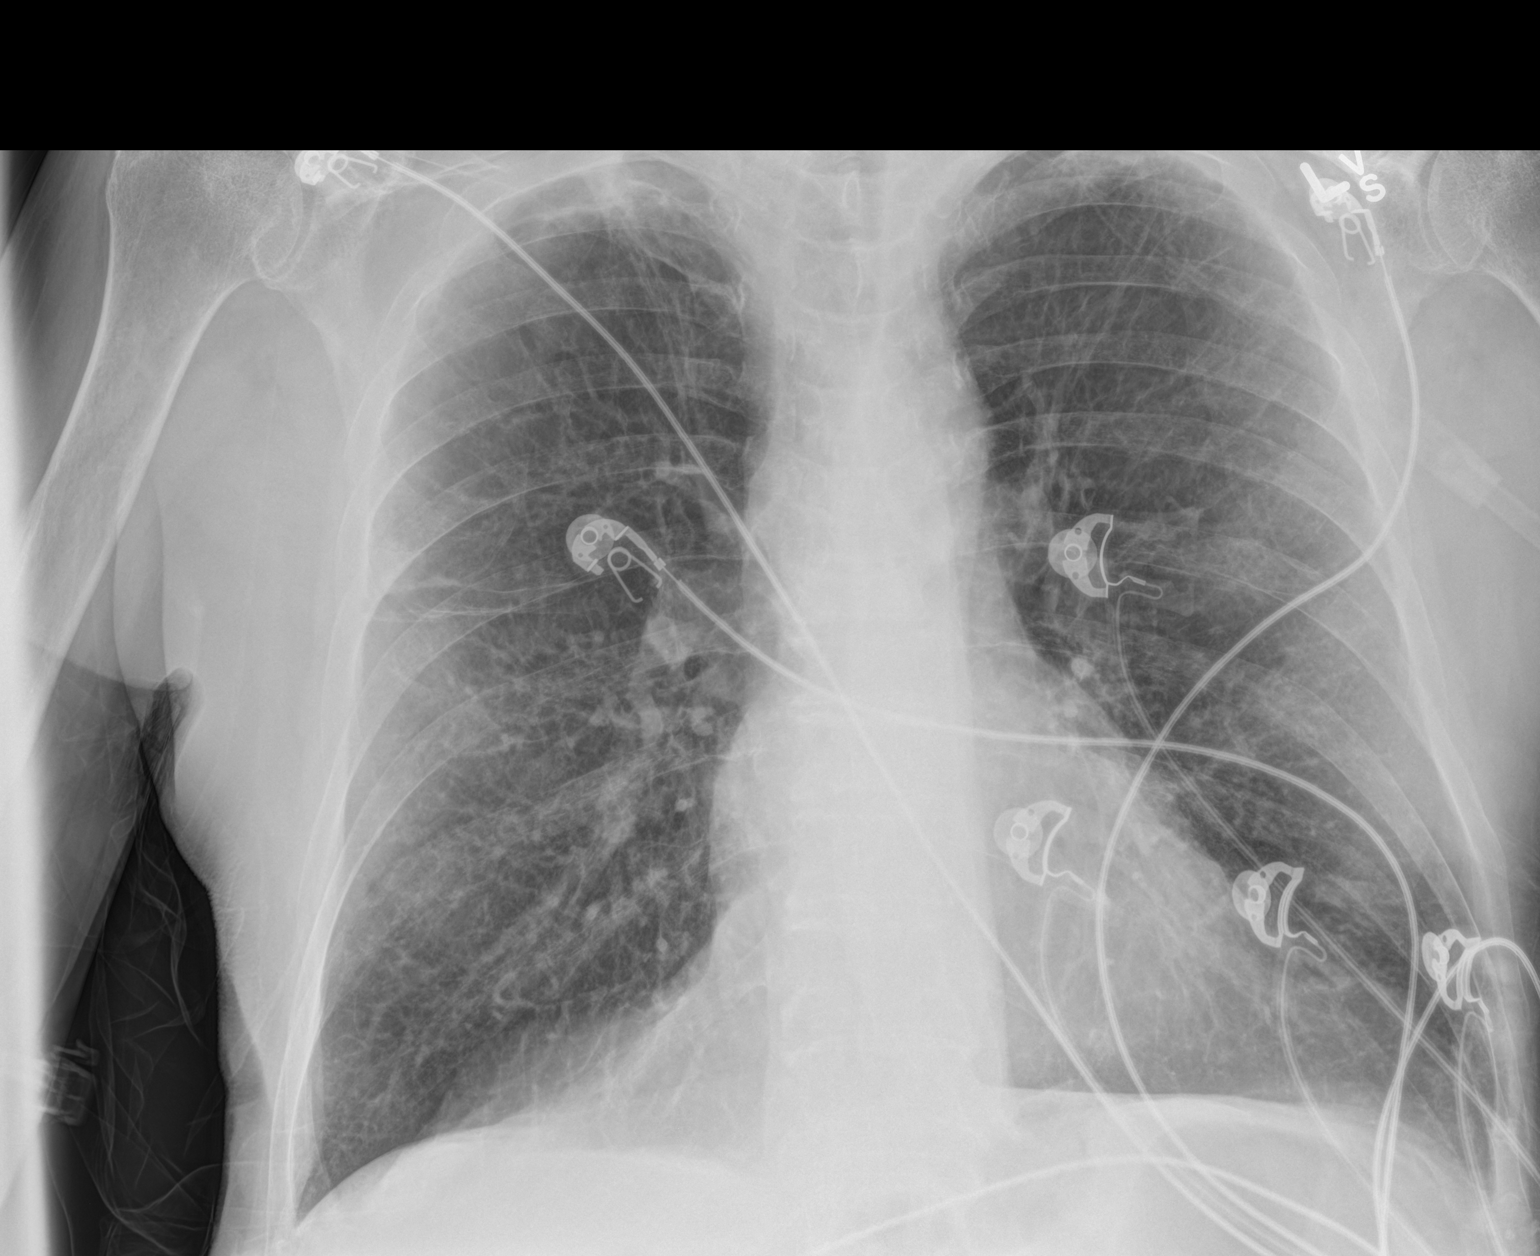

[1 of 1 positions shown; findings below may reference images not displayed]

FINDINGS: Cardiac shadow is stable. Mild interstitial changes are noted
somewhat accentuated by the portable technique. No focal confluent
infiltrate or sizable effusion is noted. No bony abnormality is
seen.
IMPRESSION: No change from the previous exam.

## 2017-03-01 IMAGING — CR DG CHEST 1V PORT
1 series · 1 of 1 positions shown · non-contrast
Comparison: 04/23/2016.  CT 04/22/2016.

CLINICAL DATA: Respiratory failure.  Shortness of breath.

EXAM:
PORTABLE CHEST 1 VIEW

[AP]
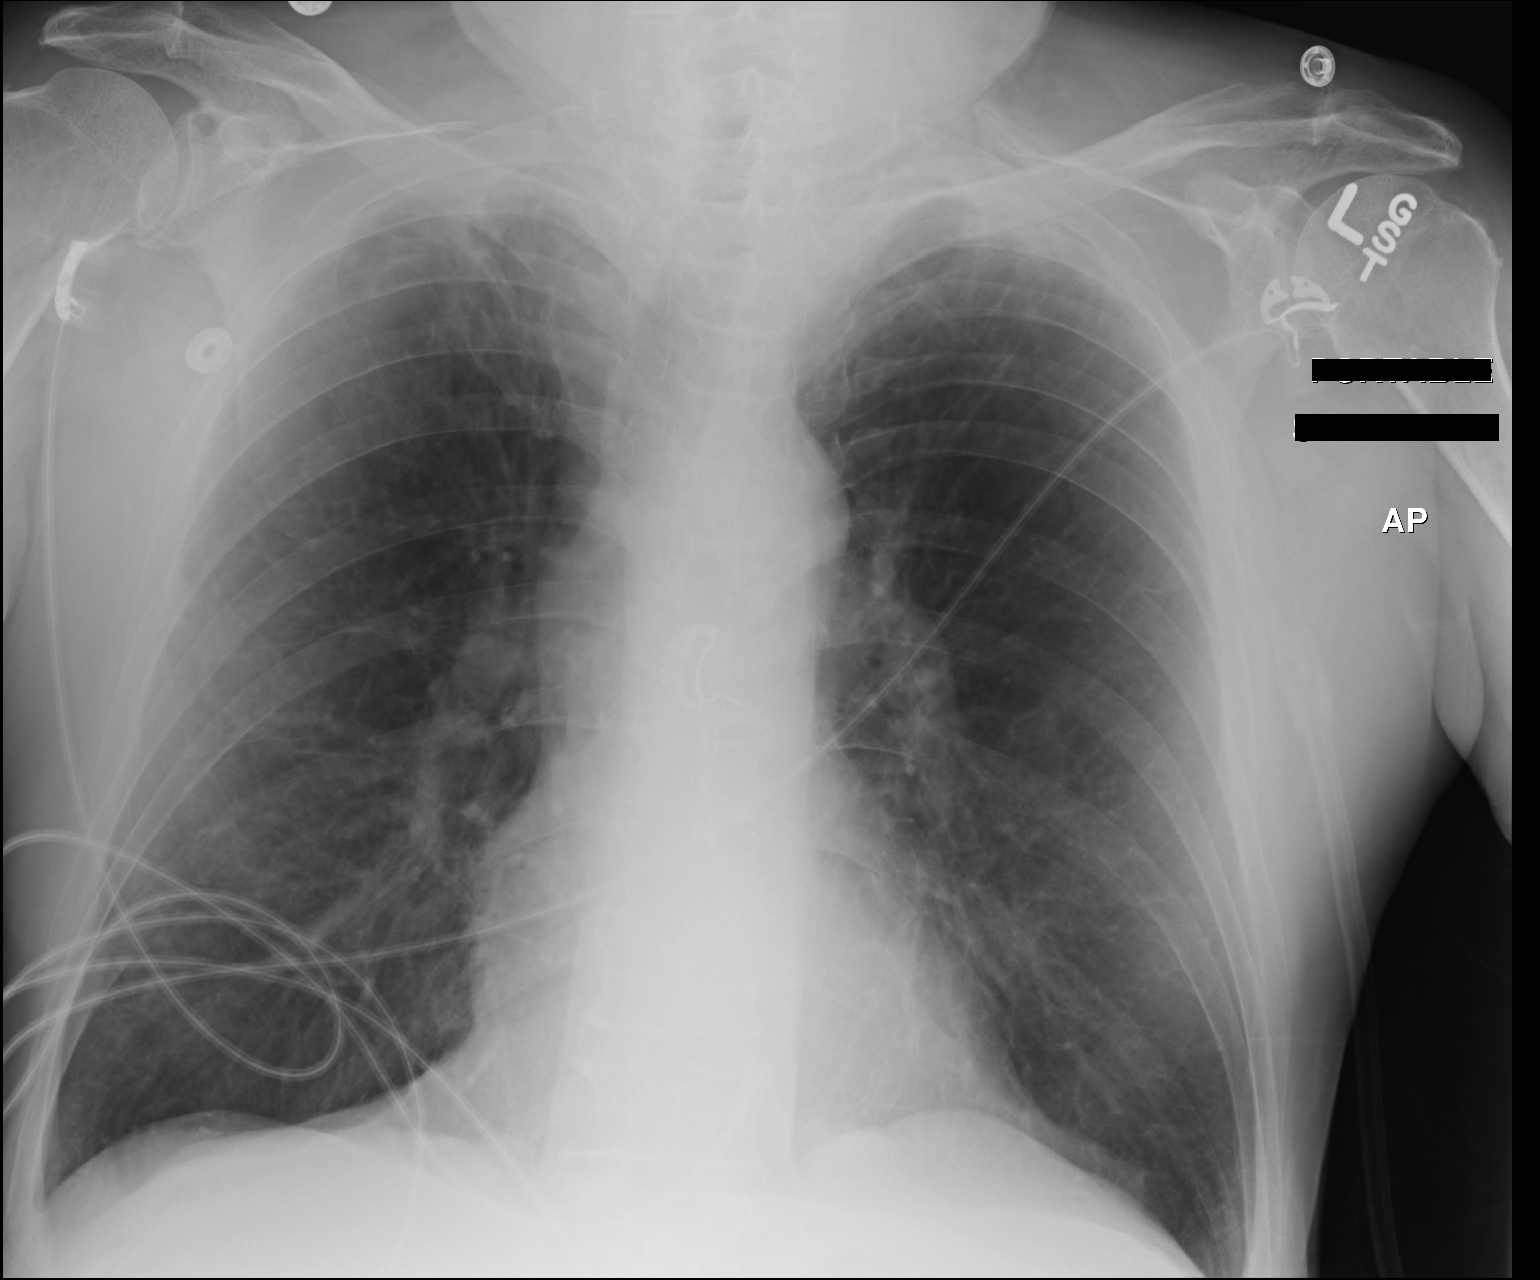

[1 of 1 positions shown; findings below may reference images not displayed]

FINDINGS: Mediastinum hilar structures normal. Heart size normal. Previously
identified nodular density in the right mid lung is again best
identified by CT. No pleural effusion or pneumothorax. No acute bony
abnormality.
IMPRESSION: 1. No acute cardiopulmonary disease.

2. Previously identified small right mid lung nodular density is
again best identified by prior CT of 04/22/2016. Reference is made
to that report.

## 2019-03-08 ENCOUNTER — Encounter: Payer: Self-pay | Admitting: *Deleted
